# Patient Record
Sex: Male | Born: 1955 | Race: White | Hispanic: No | Marital: Single | Smoking: Current some day smoker
Health system: Southern US, Community
[De-identification: ages and names within clinical notes are randomized; demographics above are authoritative.]

## PROBLEM LIST (undated history)

## (undated) DIAGNOSIS — M549 Dorsalgia, unspecified: Secondary | ICD-10-CM

## (undated) DIAGNOSIS — R51 Headache: Secondary | ICD-10-CM

## (undated) DIAGNOSIS — N200 Calculus of kidney: Secondary | ICD-10-CM

## (undated) DIAGNOSIS — K219 Gastro-esophageal reflux disease without esophagitis: Secondary | ICD-10-CM

## (undated) DIAGNOSIS — R519 Headache, unspecified: Secondary | ICD-10-CM

## (undated) DIAGNOSIS — Z8673 Personal history of transient ischemic attack (TIA), and cerebral infarction without residual deficits: Secondary | ICD-10-CM

## (undated) DIAGNOSIS — R55 Syncope and collapse: Secondary | ICD-10-CM

## (undated) DIAGNOSIS — M1990 Unspecified osteoarthritis, unspecified site: Secondary | ICD-10-CM

## (undated) DIAGNOSIS — G459 Transient cerebral ischemic attack, unspecified: Secondary | ICD-10-CM

## (undated) DIAGNOSIS — E119 Type 2 diabetes mellitus without complications: Secondary | ICD-10-CM

## (undated) DIAGNOSIS — R569 Unspecified convulsions: Secondary | ICD-10-CM

## (undated) DIAGNOSIS — D860 Sarcoidosis of lung: Secondary | ICD-10-CM

## (undated) DIAGNOSIS — Z720 Tobacco use: Secondary | ICD-10-CM

## (undated) DIAGNOSIS — N400 Enlarged prostate without lower urinary tract symptoms: Secondary | ICD-10-CM

## (undated) DIAGNOSIS — I10 Essential (primary) hypertension: Secondary | ICD-10-CM

## (undated) HISTORY — PX: TOTAL HIP ARTHROPLASTY: SHX124

## (undated) HISTORY — PX: JOINT REPLACEMENT: SHX530

## (undated) HISTORY — DX: Unspecified osteoarthritis, unspecified site: M19.90

## (undated) HISTORY — PX: KNEE SURGERY: SHX244

## (undated) HISTORY — DX: Headache, unspecified: R51.9

## (undated) HISTORY — DX: Syncope and collapse: R55

## (undated) HISTORY — DX: Headache: R51

## (undated) HISTORY — DX: Transient cerebral ischemic attack, unspecified: G45.9

---

## 1999-07-11 ENCOUNTER — Emergency Department (HOSPITAL_COMMUNITY): Admission: EM | Admit: 1999-07-11 | Discharge: 1999-07-12 | Payer: Self-pay | Admitting: Emergency Medicine

## 1999-07-12 ENCOUNTER — Encounter: Payer: Self-pay | Admitting: Emergency Medicine

## 1999-11-02 ENCOUNTER — Emergency Department (HOSPITAL_COMMUNITY): Admission: EM | Admit: 1999-11-02 | Discharge: 1999-11-02 | Payer: Self-pay | Admitting: Emergency Medicine

## 1999-11-02 ENCOUNTER — Encounter: Payer: Self-pay | Admitting: Emergency Medicine

## 1999-11-30 ENCOUNTER — Emergency Department (HOSPITAL_COMMUNITY): Admission: EM | Admit: 1999-11-30 | Discharge: 1999-11-30 | Payer: Self-pay | Admitting: Emergency Medicine

## 1999-11-30 ENCOUNTER — Encounter: Payer: Self-pay | Admitting: Emergency Medicine

## 1999-12-05 ENCOUNTER — Emergency Department (HOSPITAL_COMMUNITY): Admission: EM | Admit: 1999-12-05 | Discharge: 1999-12-05 | Payer: Self-pay | Admitting: Emergency Medicine

## 1999-12-05 ENCOUNTER — Encounter: Payer: Self-pay | Admitting: Emergency Medicine

## 2001-06-26 HISTORY — PX: HERNIA REPAIR: SHX51

## 2001-11-08 ENCOUNTER — Encounter: Payer: Self-pay | Admitting: Emergency Medicine

## 2001-11-08 ENCOUNTER — Inpatient Hospital Stay (HOSPITAL_COMMUNITY): Admission: EM | Admit: 2001-11-08 | Discharge: 2001-11-10 | Payer: Self-pay | Admitting: Emergency Medicine

## 2001-11-09 ENCOUNTER — Encounter: Payer: Self-pay | Admitting: Emergency Medicine

## 2001-11-10 ENCOUNTER — Encounter: Payer: Self-pay | Admitting: Cardiology

## 2002-03-25 ENCOUNTER — Encounter: Admission: RE | Admit: 2002-03-25 | Discharge: 2002-03-25 | Payer: Self-pay | Admitting: Urology

## 2002-03-25 ENCOUNTER — Encounter: Payer: Self-pay | Admitting: Urology

## 2002-04-10 ENCOUNTER — Encounter: Admission: RE | Admit: 2002-04-10 | Discharge: 2002-07-09 | Payer: Self-pay | Admitting: Neurology

## 2003-01-20 ENCOUNTER — Encounter: Admission: RE | Admit: 2003-01-20 | Discharge: 2003-03-12 | Payer: Self-pay | Admitting: Neurology

## 2003-09-01 ENCOUNTER — Emergency Department (HOSPITAL_COMMUNITY): Admission: EM | Admit: 2003-09-01 | Discharge: 2003-09-01 | Payer: Self-pay | Admitting: Emergency Medicine

## 2004-05-11 ENCOUNTER — Inpatient Hospital Stay (HOSPITAL_COMMUNITY): Admission: RE | Admit: 2004-05-11 | Discharge: 2004-05-13 | Payer: Self-pay | Admitting: Orthopedic Surgery

## 2004-06-02 ENCOUNTER — Observation Stay (HOSPITAL_COMMUNITY): Admission: EM | Admit: 2004-06-02 | Discharge: 2004-06-03 | Payer: Self-pay | Admitting: Emergency Medicine

## 2004-11-09 ENCOUNTER — Encounter: Admission: RE | Admit: 2004-11-09 | Discharge: 2005-01-23 | Payer: Self-pay | Admitting: Orthopedic Surgery

## 2005-05-02 ENCOUNTER — Observation Stay (HOSPITAL_COMMUNITY): Admission: EM | Admit: 2005-05-02 | Discharge: 2005-05-02 | Payer: Self-pay | Admitting: *Deleted

## 2005-09-25 ENCOUNTER — Ambulatory Visit: Payer: Self-pay

## 2005-09-25 ENCOUNTER — Encounter: Payer: Self-pay | Admitting: Cardiology

## 2006-03-12 ENCOUNTER — Emergency Department (HOSPITAL_COMMUNITY): Admission: EM | Admit: 2006-03-12 | Discharge: 2006-03-12 | Payer: Self-pay | Admitting: Emergency Medicine

## 2006-06-28 ENCOUNTER — Emergency Department (HOSPITAL_COMMUNITY): Admission: EM | Admit: 2006-06-28 | Discharge: 2006-06-28 | Payer: Self-pay | Admitting: Emergency Medicine

## 2006-07-11 ENCOUNTER — Ambulatory Visit (HOSPITAL_BASED_OUTPATIENT_CLINIC_OR_DEPARTMENT_OTHER): Admission: RE | Admit: 2006-07-11 | Discharge: 2006-07-11 | Payer: Self-pay | Admitting: Urology

## 2006-11-27 ENCOUNTER — Emergency Department (HOSPITAL_COMMUNITY): Admission: EM | Admit: 2006-11-27 | Discharge: 2006-11-27 | Payer: Self-pay | Admitting: *Deleted

## 2006-12-06 ENCOUNTER — Ambulatory Visit (HOSPITAL_BASED_OUTPATIENT_CLINIC_OR_DEPARTMENT_OTHER): Admission: RE | Admit: 2006-12-06 | Discharge: 2006-12-06 | Payer: Self-pay | Admitting: Urology

## 2007-02-19 ENCOUNTER — Emergency Department (HOSPITAL_COMMUNITY): Admission: EM | Admit: 2007-02-19 | Discharge: 2007-02-20 | Payer: Self-pay | Admitting: Emergency Medicine

## 2007-04-22 ENCOUNTER — Encounter: Admission: RE | Admit: 2007-04-22 | Discharge: 2007-04-22 | Payer: Self-pay | Admitting: Neurology

## 2007-08-13 ENCOUNTER — Encounter: Payer: Self-pay | Admitting: Internal Medicine

## 2007-09-03 ENCOUNTER — Ambulatory Visit: Payer: Self-pay | Admitting: Internal Medicine

## 2007-09-03 ENCOUNTER — Ambulatory Visit: Payer: Self-pay | Admitting: Cardiology

## 2007-09-03 DIAGNOSIS — R599 Enlarged lymph nodes, unspecified: Secondary | ICD-10-CM | POA: Insufficient documentation

## 2007-09-03 DIAGNOSIS — F17200 Nicotine dependence, unspecified, uncomplicated: Secondary | ICD-10-CM | POA: Insufficient documentation

## 2007-09-03 DIAGNOSIS — E785 Hyperlipidemia, unspecified: Secondary | ICD-10-CM | POA: Insufficient documentation

## 2007-09-03 DIAGNOSIS — R930 Abnormal findings on diagnostic imaging of skull and head, not elsewhere classified: Secondary | ICD-10-CM | POA: Insufficient documentation

## 2007-09-03 LAB — CONVERTED CEMR LAB
Calcium: 9.4 mg/dL (ref 8.4–10.5)
GFR calc Af Amer: 101 mL/min
GFR calc non Af Amer: 83 mL/min
Glucose, Bld: 80 mg/dL (ref 70–99)
Sodium: 143 meq/L (ref 135–145)

## 2007-09-04 ENCOUNTER — Ambulatory Visit: Payer: Self-pay | Admitting: Internal Medicine

## 2007-09-04 LAB — CONVERTED CEMR LAB: Pro B Natriuretic peptide (BNP): 20 pg/mL (ref 0.0–100.0)

## 2007-09-07 ENCOUNTER — Telehealth: Payer: Self-pay | Admitting: Internal Medicine

## 2007-09-11 ENCOUNTER — Ambulatory Visit: Payer: Self-pay

## 2007-09-11 ENCOUNTER — Encounter: Payer: Self-pay | Admitting: Internal Medicine

## 2007-09-17 ENCOUNTER — Ambulatory Visit: Payer: Self-pay | Admitting: Internal Medicine

## 2007-09-23 ENCOUNTER — Ambulatory Visit: Payer: Self-pay | Admitting: Internal Medicine

## 2007-09-23 DIAGNOSIS — R942 Abnormal results of pulmonary function studies: Secondary | ICD-10-CM

## 2007-09-24 ENCOUNTER — Ambulatory Visit: Payer: Self-pay | Admitting: Cardiovascular Disease

## 2007-10-10 ENCOUNTER — Encounter: Payer: Self-pay | Admitting: Internal Medicine

## 2007-10-10 ENCOUNTER — Ambulatory Visit: Payer: Self-pay

## 2008-01-03 DIAGNOSIS — M76899 Other specified enthesopathies of unspecified lower limb, excluding foot: Secondary | ICD-10-CM

## 2008-01-21 ENCOUNTER — Ambulatory Visit: Payer: Self-pay | Admitting: Nurse Practitioner

## 2008-01-21 DIAGNOSIS — D869 Sarcoidosis, unspecified: Secondary | ICD-10-CM | POA: Insufficient documentation

## 2008-01-21 DIAGNOSIS — M51379 Other intervertebral disc degeneration, lumbosacral region without mention of lumbar back pain or lower extremity pain: Secondary | ICD-10-CM | POA: Insufficient documentation

## 2008-01-21 DIAGNOSIS — M5137 Other intervertebral disc degeneration, lumbosacral region: Secondary | ICD-10-CM

## 2008-01-21 DIAGNOSIS — I10 Essential (primary) hypertension: Secondary | ICD-10-CM

## 2008-01-21 LAB — CONVERTED CEMR LAB
Nitrite: NEGATIVE
Specific Gravity, Urine: 1.025
Urobilinogen, UA: 0.2

## 2008-01-28 ENCOUNTER — Encounter (INDEPENDENT_AMBULATORY_CARE_PROVIDER_SITE_OTHER): Payer: Self-pay | Admitting: Nurse Practitioner

## 2008-02-03 LAB — CONVERTED CEMR LAB
ALT: 19 units/L (ref 0–53)
AST: 16 units/L (ref 0–37)
Albumin: 4.3 g/dL (ref 3.5–5.2)
Alkaline Phosphatase: 77 units/L (ref 39–117)
BUN: 15 mg/dL (ref 6–23)
Basophils Absolute: 0 10*3/uL (ref 0.0–0.1)
Basophils Relative: 0 % (ref 0–1)
Eosinophils Absolute: 0.3 10*3/uL (ref 0.0–0.7)
HDL: 36 mg/dL — ABNORMAL LOW (ref >39)
LDL Cholesterol: 119 mg/dL — ABNORMAL HIGH (ref 0–99)
MCHC: 33 g/dL (ref 30.0–36.0)
MCV: 93.7 fL (ref 78.0–100.0)
Microalb, Ur: 1.75 mg/dL (ref 0.00–1.89)
Neutrophils Relative %: 58 % (ref 43–77)
PSA: 0.69 ng/mL (ref 0.10–4.00)
Platelets: 252 10*3/uL (ref 150–400)
Potassium: 4.6 meq/L (ref 3.5–5.3)
RDW: 15 % (ref 11.5–15.5)
Sodium: 141 meq/L (ref 135–145)
Total Protein: 6.9 g/dL (ref 6.0–8.3)

## 2008-02-04 ENCOUNTER — Ambulatory Visit: Payer: Self-pay | Admitting: Nurse Practitioner

## 2008-02-20 ENCOUNTER — Telehealth (INDEPENDENT_AMBULATORY_CARE_PROVIDER_SITE_OTHER): Payer: Self-pay | Admitting: Nurse Practitioner

## 2008-02-26 ENCOUNTER — Ambulatory Visit: Payer: Self-pay | Admitting: Nurse Practitioner

## 2008-02-26 DIAGNOSIS — Z87442 Personal history of urinary calculi: Secondary | ICD-10-CM

## 2008-02-26 LAB — CONVERTED CEMR LAB
Bilirubin Urine: NEGATIVE
Blood in Urine, dipstick: NEGATIVE
Ketones, urine, test strip: NEGATIVE
Nitrite: NEGATIVE
Specific Gravity, Urine: 1.02
Urobilinogen, UA: 0.2

## 2008-04-08 ENCOUNTER — Ambulatory Visit: Payer: Self-pay | Admitting: Nurse Practitioner

## 2008-04-09 LAB — CONVERTED CEMR LAB
ALT: 14 units/L (ref 0–53)
AST: 15 units/L (ref 0–37)
Albumin: 4 g/dL (ref 3.5–5.2)
Alkaline Phosphatase: 65 units/L (ref 39–117)
Cholesterol: 174 mg/dL (ref 0–200)
HDL: 30 mg/dL — ABNORMAL LOW (ref >39)
Indirect Bilirubin: 0.5 mg/dL (ref 0.0–0.9)
LDL Cholesterol: 113 mg/dL — ABNORMAL HIGH (ref 0–99)
Total Protein: 6.3 g/dL (ref 6.0–8.3)
Triglycerides: 153 mg/dL — ABNORMAL HIGH (ref <150)

## 2008-04-16 ENCOUNTER — Encounter (INDEPENDENT_AMBULATORY_CARE_PROVIDER_SITE_OTHER): Payer: Self-pay | Admitting: *Deleted

## 2008-05-25 ENCOUNTER — Emergency Department (HOSPITAL_BASED_OUTPATIENT_CLINIC_OR_DEPARTMENT_OTHER): Admission: EM | Admit: 2008-05-25 | Discharge: 2008-05-25 | Payer: Self-pay | Admitting: Emergency Medicine

## 2008-06-04 ENCOUNTER — Encounter: Payer: Self-pay | Admitting: Emergency Medicine

## 2008-06-04 ENCOUNTER — Emergency Department (HOSPITAL_COMMUNITY): Admission: EM | Admit: 2008-06-04 | Discharge: 2008-06-04 | Payer: Self-pay | Admitting: Emergency Medicine

## 2008-06-04 ENCOUNTER — Ambulatory Visit: Payer: Self-pay | Admitting: Interventional Radiology

## 2008-06-04 ENCOUNTER — Encounter (INDEPENDENT_AMBULATORY_CARE_PROVIDER_SITE_OTHER): Payer: Self-pay | Admitting: Nurse Practitioner

## 2008-06-04 LAB — CONVERTED CEMR LAB
Basophils Absolute: 0.1 10*3/uL
Basophils Relative: 1 %
CO2: 28 meq/L
Chloride: 107 meq/L
Creatinine, Ser: 0.9 mg/dL
Lymphocytes Relative: 32 %
MCHC: 33.3 g/dL
Monocytes Absolute: 0.8 10*3/uL
Neutro Abs: 9.7 10*3/uL
Neutrophils Relative %: 60 %
Platelets: 292 10*3/uL
Potassium: 3.9 meq/L
RDW: 14.1 %
Sodium: 144 meq/L

## 2008-06-08 ENCOUNTER — Encounter (INDEPENDENT_AMBULATORY_CARE_PROVIDER_SITE_OTHER): Payer: Self-pay | Admitting: Nurse Practitioner

## 2008-06-08 ENCOUNTER — Telehealth (INDEPENDENT_AMBULATORY_CARE_PROVIDER_SITE_OTHER): Payer: Self-pay | Admitting: Nurse Practitioner

## 2008-06-08 DIAGNOSIS — N200 Calculus of kidney: Secondary | ICD-10-CM | POA: Insufficient documentation

## 2008-06-08 DIAGNOSIS — N1330 Unspecified hydronephrosis: Secondary | ICD-10-CM

## 2008-06-09 ENCOUNTER — Encounter (INDEPENDENT_AMBULATORY_CARE_PROVIDER_SITE_OTHER): Payer: Self-pay | Admitting: Nurse Practitioner

## 2008-06-10 ENCOUNTER — Emergency Department (HOSPITAL_COMMUNITY): Admission: EM | Admit: 2008-06-10 | Discharge: 2008-06-10 | Payer: Self-pay | Admitting: Emergency Medicine

## 2008-09-09 ENCOUNTER — Encounter: Admission: RE | Admit: 2008-09-09 | Discharge: 2008-09-09 | Payer: Self-pay | Admitting: Neurology

## 2008-10-09 ENCOUNTER — Ambulatory Visit: Payer: Self-pay | Admitting: Nurse Practitioner

## 2008-10-09 DIAGNOSIS — J309 Allergic rhinitis, unspecified: Secondary | ICD-10-CM

## 2008-10-09 DIAGNOSIS — M62838 Other muscle spasm: Secondary | ICD-10-CM | POA: Insufficient documentation

## 2008-10-09 DIAGNOSIS — M25519 Pain in unspecified shoulder: Secondary | ICD-10-CM | POA: Insufficient documentation

## 2008-10-19 ENCOUNTER — Ambulatory Visit: Payer: Self-pay | Admitting: Diagnostic Radiology

## 2008-10-19 ENCOUNTER — Emergency Department (HOSPITAL_BASED_OUTPATIENT_CLINIC_OR_DEPARTMENT_OTHER): Admission: EM | Admit: 2008-10-19 | Discharge: 2008-10-19 | Payer: Self-pay | Admitting: Emergency Medicine

## 2008-10-23 ENCOUNTER — Ambulatory Visit: Payer: Self-pay | Admitting: Nurse Practitioner

## 2008-10-23 LAB — CONVERTED CEMR LAB
Albumin: 4 g/dL (ref 3.5–5.2)
Alkaline Phosphatase: 75 units/L (ref 39–117)
HDL: 26 mg/dL — ABNORMAL LOW (ref >39)
LDL Cholesterol: 91 mg/dL (ref 0–99)
Total CHOL/HDL Ratio: 6
Total Protein: 6.1 g/dL (ref 6.0–8.3)
Triglycerides: 189 mg/dL — ABNORMAL HIGH (ref <150)

## 2008-10-26 ENCOUNTER — Encounter (INDEPENDENT_AMBULATORY_CARE_PROVIDER_SITE_OTHER): Payer: Self-pay | Admitting: Nurse Practitioner

## 2008-10-30 ENCOUNTER — Telehealth (INDEPENDENT_AMBULATORY_CARE_PROVIDER_SITE_OTHER): Payer: Self-pay | Admitting: Nurse Practitioner

## 2008-10-30 ENCOUNTER — Encounter (INDEPENDENT_AMBULATORY_CARE_PROVIDER_SITE_OTHER): Payer: Self-pay | Admitting: Nurse Practitioner

## 2008-11-02 ENCOUNTER — Emergency Department (HOSPITAL_COMMUNITY): Admission: EM | Admit: 2008-11-02 | Discharge: 2008-11-02 | Payer: Self-pay | Admitting: Emergency Medicine

## 2008-11-06 ENCOUNTER — Ambulatory Visit: Payer: Self-pay | Admitting: Nurse Practitioner

## 2008-11-06 LAB — CONVERTED CEMR LAB: Potassium: 3.9 meq/L (ref 3.5–5.3)

## 2008-11-09 ENCOUNTER — Encounter (INDEPENDENT_AMBULATORY_CARE_PROVIDER_SITE_OTHER): Payer: Self-pay | Admitting: Nurse Practitioner

## 2008-11-11 ENCOUNTER — Telehealth (INDEPENDENT_AMBULATORY_CARE_PROVIDER_SITE_OTHER): Payer: Self-pay | Admitting: Nurse Practitioner

## 2008-11-19 ENCOUNTER — Encounter (INDEPENDENT_AMBULATORY_CARE_PROVIDER_SITE_OTHER): Payer: Self-pay | Admitting: *Deleted

## 2008-11-24 ENCOUNTER — Encounter (INDEPENDENT_AMBULATORY_CARE_PROVIDER_SITE_OTHER): Payer: Self-pay | Admitting: Nurse Practitioner

## 2008-11-24 DIAGNOSIS — G459 Transient cerebral ischemic attack, unspecified: Secondary | ICD-10-CM | POA: Insufficient documentation

## 2008-11-24 DIAGNOSIS — M549 Dorsalgia, unspecified: Secondary | ICD-10-CM | POA: Insufficient documentation

## 2008-11-27 ENCOUNTER — Ambulatory Visit: Payer: Self-pay | Admitting: Nurse Practitioner

## 2008-11-27 DIAGNOSIS — E669 Obesity, unspecified: Secondary | ICD-10-CM

## 2009-01-07 ENCOUNTER — Emergency Department (HOSPITAL_BASED_OUTPATIENT_CLINIC_OR_DEPARTMENT_OTHER): Admission: EM | Admit: 2009-01-07 | Discharge: 2009-01-07 | Payer: Self-pay | Admitting: Emergency Medicine

## 2009-01-07 ENCOUNTER — Ambulatory Visit: Payer: Self-pay | Admitting: Diagnostic Radiology

## 2009-01-25 ENCOUNTER — Telehealth (INDEPENDENT_AMBULATORY_CARE_PROVIDER_SITE_OTHER): Payer: Self-pay | Admitting: Nurse Practitioner

## 2009-02-08 ENCOUNTER — Encounter (INDEPENDENT_AMBULATORY_CARE_PROVIDER_SITE_OTHER): Payer: Self-pay | Admitting: Family Medicine

## 2009-03-20 ENCOUNTER — Emergency Department (HOSPITAL_BASED_OUTPATIENT_CLINIC_OR_DEPARTMENT_OTHER): Admission: EM | Admit: 2009-03-20 | Discharge: 2009-03-20 | Payer: Self-pay | Admitting: Emergency Medicine

## 2009-03-20 ENCOUNTER — Ambulatory Visit: Payer: Self-pay | Admitting: Diagnostic Radiology

## 2009-04-20 ENCOUNTER — Ambulatory Visit (HOSPITAL_COMMUNITY): Admission: EM | Admit: 2009-04-20 | Discharge: 2009-04-21 | Payer: Self-pay | Admitting: Emergency Medicine

## 2009-04-22 ENCOUNTER — Emergency Department (HOSPITAL_COMMUNITY): Admission: EM | Admit: 2009-04-22 | Discharge: 2009-04-23 | Payer: Self-pay | Admitting: Emergency Medicine

## 2009-04-22 ENCOUNTER — Telehealth (INDEPENDENT_AMBULATORY_CARE_PROVIDER_SITE_OTHER): Payer: Self-pay | Admitting: *Deleted

## 2009-04-23 ENCOUNTER — Encounter (INDEPENDENT_AMBULATORY_CARE_PROVIDER_SITE_OTHER): Payer: Self-pay | Admitting: Nurse Practitioner

## 2009-04-27 ENCOUNTER — Ambulatory Visit (HOSPITAL_BASED_OUTPATIENT_CLINIC_OR_DEPARTMENT_OTHER): Admission: RE | Admit: 2009-04-27 | Discharge: 2009-04-27 | Payer: Self-pay | Admitting: Urology

## 2009-04-29 ENCOUNTER — Telehealth (INDEPENDENT_AMBULATORY_CARE_PROVIDER_SITE_OTHER): Payer: Self-pay | Admitting: *Deleted

## 2009-05-07 ENCOUNTER — Emergency Department (HOSPITAL_BASED_OUTPATIENT_CLINIC_OR_DEPARTMENT_OTHER): Admission: EM | Admit: 2009-05-07 | Discharge: 2009-05-07 | Payer: Self-pay | Admitting: Emergency Medicine

## 2009-05-17 ENCOUNTER — Encounter (INDEPENDENT_AMBULATORY_CARE_PROVIDER_SITE_OTHER): Payer: Self-pay | Admitting: Nurse Practitioner

## 2009-07-14 ENCOUNTER — Ambulatory Visit: Payer: Self-pay | Admitting: Nurse Practitioner

## 2009-09-23 ENCOUNTER — Ambulatory Visit: Payer: Self-pay | Admitting: Nurse Practitioner

## 2009-09-23 DIAGNOSIS — R490 Dysphonia: Secondary | ICD-10-CM

## 2009-09-23 LAB — CONVERTED CEMR LAB
AST: 14 units/L (ref 0–37)
Albumin: 4.2 g/dL (ref 3.5–5.2)
BUN: 12 mg/dL (ref 6–23)
Bilirubin Urine: NEGATIVE
CO2: 28 meq/L (ref 19–32)
Calcium: 9.5 mg/dL (ref 8.4–10.5)
Chloride: 103 meq/L (ref 96–112)
Eosinophils Relative: 2 % (ref 0–5)
Glucose, Bld: 87 mg/dL (ref 70–99)
HCT: 50.7 % (ref 39.0–52.0)
HDL goal, serum: 40 mg/dL
Hemoglobin: 16.8 g/dL (ref 13.0–17.0)
Lymphocytes Relative: 30 % (ref 12–46)
Lymphs Abs: 2.9 10*3/uL (ref 0.7–4.0)
Microalb, Ur: 1.52 mg/dL (ref 0.00–1.89)
Monocytes Absolute: 0.7 10*3/uL (ref 0.1–1.0)
Monocytes Relative: 7 % (ref 3–12)
Nitrite: NEGATIVE
PSA: 0.4 ng/mL (ref 0.10–4.00)
Potassium: 5 meq/L (ref 3.5–5.3)
Rapid HIV Screen: NEGATIVE
Specific Gravity, Urine: >=1.03
TSH: 2.057 microintl units/mL (ref 0.350–4.500)
Urobilinogen, UA: 0.2
WBC: 9.7 10*3/uL (ref 4.0–10.5)

## 2009-09-24 ENCOUNTER — Encounter (INDEPENDENT_AMBULATORY_CARE_PROVIDER_SITE_OTHER): Payer: Self-pay | Admitting: Nurse Practitioner

## 2009-10-07 ENCOUNTER — Ambulatory Visit: Payer: Self-pay | Admitting: Nurse Practitioner

## 2009-10-07 ENCOUNTER — Telehealth (INDEPENDENT_AMBULATORY_CARE_PROVIDER_SITE_OTHER): Payer: Self-pay | Admitting: Nurse Practitioner

## 2009-10-29 ENCOUNTER — Telehealth (INDEPENDENT_AMBULATORY_CARE_PROVIDER_SITE_OTHER): Payer: Self-pay | Admitting: Nurse Practitioner

## 2009-12-13 ENCOUNTER — Ambulatory Visit: Payer: Self-pay | Admitting: Nurse Practitioner

## 2009-12-13 DIAGNOSIS — L919 Hypertrophic disorder of the skin, unspecified: Secondary | ICD-10-CM

## 2009-12-13 DIAGNOSIS — L909 Atrophic disorder of skin, unspecified: Secondary | ICD-10-CM | POA: Insufficient documentation

## 2009-12-15 ENCOUNTER — Telehealth (INDEPENDENT_AMBULATORY_CARE_PROVIDER_SITE_OTHER): Payer: Self-pay | Admitting: Nurse Practitioner

## 2009-12-21 ENCOUNTER — Telehealth (INDEPENDENT_AMBULATORY_CARE_PROVIDER_SITE_OTHER): Payer: Self-pay | Admitting: Nurse Practitioner

## 2009-12-27 ENCOUNTER — Ambulatory Visit: Payer: Self-pay | Admitting: Diagnostic Radiology

## 2009-12-27 ENCOUNTER — Emergency Department (HOSPITAL_BASED_OUTPATIENT_CLINIC_OR_DEPARTMENT_OTHER): Admission: EM | Admit: 2009-12-27 | Discharge: 2009-12-27 | Payer: Self-pay | Admitting: Emergency Medicine

## 2009-12-28 ENCOUNTER — Telehealth (INDEPENDENT_AMBULATORY_CARE_PROVIDER_SITE_OTHER): Payer: Self-pay | Admitting: Nurse Practitioner

## 2010-01-07 ENCOUNTER — Telehealth (INDEPENDENT_AMBULATORY_CARE_PROVIDER_SITE_OTHER): Payer: Self-pay | Admitting: Nurse Practitioner

## 2010-01-11 ENCOUNTER — Emergency Department (HOSPITAL_COMMUNITY): Admission: EM | Admit: 2010-01-11 | Discharge: 2010-01-11 | Payer: Self-pay | Admitting: Emergency Medicine

## 2010-01-21 ENCOUNTER — Emergency Department (HOSPITAL_BASED_OUTPATIENT_CLINIC_OR_DEPARTMENT_OTHER): Admission: EM | Admit: 2010-01-21 | Discharge: 2010-01-21 | Payer: Self-pay | Admitting: Emergency Medicine

## 2010-01-28 ENCOUNTER — Encounter (INDEPENDENT_AMBULATORY_CARE_PROVIDER_SITE_OTHER): Payer: Self-pay | Admitting: Nurse Practitioner

## 2010-01-29 ENCOUNTER — Emergency Department (HOSPITAL_BASED_OUTPATIENT_CLINIC_OR_DEPARTMENT_OTHER): Admission: EM | Admit: 2010-01-29 | Discharge: 2010-01-29 | Payer: Self-pay | Admitting: Emergency Medicine

## 2010-02-02 ENCOUNTER — Encounter (INDEPENDENT_AMBULATORY_CARE_PROVIDER_SITE_OTHER): Payer: Self-pay | Admitting: Nurse Practitioner

## 2010-02-04 ENCOUNTER — Telehealth (INDEPENDENT_AMBULATORY_CARE_PROVIDER_SITE_OTHER): Payer: Self-pay | Admitting: Nurse Practitioner

## 2010-02-14 ENCOUNTER — Emergency Department (HOSPITAL_BASED_OUTPATIENT_CLINIC_OR_DEPARTMENT_OTHER): Admission: EM | Admit: 2010-02-14 | Discharge: 2010-02-14 | Payer: Self-pay | Admitting: Emergency Medicine

## 2010-02-22 ENCOUNTER — Encounter (INDEPENDENT_AMBULATORY_CARE_PROVIDER_SITE_OTHER): Payer: Self-pay | Admitting: Nurse Practitioner

## 2010-02-28 ENCOUNTER — Ambulatory Visit: Payer: Self-pay | Admitting: Diagnostic Radiology

## 2010-02-28 ENCOUNTER — Emergency Department (HOSPITAL_BASED_OUTPATIENT_CLINIC_OR_DEPARTMENT_OTHER): Admission: EM | Admit: 2010-02-28 | Discharge: 2010-02-28 | Payer: Self-pay | Admitting: Emergency Medicine

## 2010-03-03 ENCOUNTER — Encounter
Admission: RE | Admit: 2010-03-03 | Discharge: 2010-06-01 | Payer: Self-pay | Source: Home / Self Care | Admitting: Physical Medicine & Rehabilitation

## 2010-03-07 ENCOUNTER — Telehealth (INDEPENDENT_AMBULATORY_CARE_PROVIDER_SITE_OTHER): Payer: Self-pay | Admitting: Nurse Practitioner

## 2010-03-08 ENCOUNTER — Ambulatory Visit: Payer: Self-pay | Admitting: Physical Medicine & Rehabilitation

## 2010-03-16 ENCOUNTER — Emergency Department (HOSPITAL_COMMUNITY): Admission: EM | Admit: 2010-03-16 | Discharge: 2010-03-16 | Payer: Self-pay | Admitting: Emergency Medicine

## 2010-03-21 ENCOUNTER — Telehealth (INDEPENDENT_AMBULATORY_CARE_PROVIDER_SITE_OTHER): Payer: Self-pay | Admitting: Nurse Practitioner

## 2010-04-04 ENCOUNTER — Encounter: Admission: RE | Admit: 2010-04-04 | Discharge: 2010-04-04 | Payer: Self-pay | Admitting: Orthopedic Surgery

## 2010-04-05 ENCOUNTER — Ambulatory Visit: Payer: Self-pay | Admitting: Physical Medicine & Rehabilitation

## 2010-05-04 ENCOUNTER — Ambulatory Visit: Payer: Self-pay | Admitting: Physical Medicine & Rehabilitation

## 2010-05-24 ENCOUNTER — Encounter
Admission: RE | Admit: 2010-05-24 | Discharge: 2010-07-04 | Payer: Self-pay | Source: Home / Self Care | Attending: Physical Medicine & Rehabilitation | Admitting: Physical Medicine & Rehabilitation

## 2010-06-03 ENCOUNTER — Telehealth (INDEPENDENT_AMBULATORY_CARE_PROVIDER_SITE_OTHER): Payer: Self-pay | Admitting: Nurse Practitioner

## 2010-06-03 ENCOUNTER — Ambulatory Visit: Payer: Self-pay | Admitting: Physical Medicine & Rehabilitation

## 2010-06-08 ENCOUNTER — Ambulatory Visit: Payer: Self-pay | Admitting: Physical Medicine & Rehabilitation

## 2010-06-15 ENCOUNTER — Ambulatory Visit: Payer: Self-pay | Admitting: Nurse Practitioner

## 2010-06-28 ENCOUNTER — Encounter
Admission: RE | Admit: 2010-06-28 | Discharge: 2010-07-04 | Payer: Self-pay | Source: Home / Self Care | Attending: Physical Medicine & Rehabilitation | Admitting: Physical Medicine & Rehabilitation

## 2010-07-05 ENCOUNTER — Telehealth (INDEPENDENT_AMBULATORY_CARE_PROVIDER_SITE_OTHER): Payer: Self-pay | Admitting: Nurse Practitioner

## 2010-07-19 NOTE — Procedures (Addendum)
NAMESALOME, COZBY NO.:  [id number]  MEDICAL RECORD NO.:  [id number]           PATIENT TYPE:  LOCATION:                                 FACILITY:  PHYSICIAN:  Erick Colace, M.D.DATE OF BIRTH:  February 03, 1956  DATE OF PROCEDURE: DATE OF DISCHARGE:                              OPERATIVE REPORT  PROCEDURE:  Left greater trochanteric bursa injection.  INDICATION:  Left trochanteric bursitis.  Pain is only partially response to medication management including narcotic analgesic medications and interferes with sleep.  Informed consent was obtained after describing risks and benefits of the procedure with the patient.  He elects to proceed and has given written consent.  The patient was placed in a right lateral decubitus position area over the greater trochanter marked, prepped with Betadine, alcohol; entered with 25-gauge, a inch and a half needle.  Could not achieve bone contact, therefore we utilized a 25-gauge 3.5-inch needle which was inserted.  After negative drawback of blood, 1 mL of 40 mg/mL Depo- Medrol and 4 mL of 1% lidocaine were injected.  The patient tolerated the procedure well.  Postprocedure instructions given.     Erick Colace, M.D. Electronically Signed    AEK/MEDQ  D:  07/04/2010 16:35:07  T:  07/05/2010 08:32:10  Job:  [id number]

## 2010-07-26 NOTE — Progress Notes (Signed)
Summary: medications? refills  Phone Note Call from Patient Call back at Home Phone [phone] Call back at  [phone]   Summary of Call: Pt wants to speak with the provider in reference a prescription that her orthopedic won't be able to refills anymore; the name of the medication is oxycodone 10/325 and fenpenyl 50 mcg  per hour. .  ([facility] Patrina Levering FNP Initial call taken by: Manon Hilding,  December 21, 2009 4:14 PM  Follow-up for Phone Call        forward to N. Daphine Deutscher, FNP Follow-up by: Levon Hedger,  December 23, 2009 11:37 AM  Additional Follow-up for Phone Call Additional follow up Details #1::        mrr riordan says its not that they wont be able to fill that rx untilhe pays a certaiin amount on the balance that he owes that there office. He says all he wants you to do is just write it for this coming month,(july) and then once he pays off the balance or at least half of $1,500, they will take back over. Additional Follow-up by: Leodis Rains,  December 23, 2009 12:54 PM    Additional Follow-up for Phone Call Additional follow up Details #2::    Sorry, pt is on high dose narcotic medications and unfortunately he will not be able to get Rx from this office. This is as a general rule to ALL pts going to pain clinics/Specialist -it is not specific to him. Although I did see the note regarding his financial situation unfortunately that's a request we won't be able to accomodate Follow-up by: Lehman Prom FNP,  December 23, 2009 1:13 PM  Additional Follow-up for Phone Call Additional follow up Details #3:: Details for Additional Follow-up Action Taken: Levon Hedger  December 23, 2009 3:12 PM Left message on machine for pt to return call to the office.  Patient returned call.Cala Bradford Tinnin  December 23, 2009 4:04 PM  Spoke with pt. and advised of provider's response -- asked if provider could refer to another doctor so that he could be seen and have his meds taken care of  until his settlement comes in? Dutch Quint RN  December 24, 2009 9:32 AM Pt would have to be referred to a pain clinic.  will order referral n.martin,fnp  December 28, 2009 1:59 PM

## 2010-07-26 NOTE — Assessment & Plan Note (Signed)
Summary: Skin Tags   Vital Signs:  Patient profile:   55 year old male Weight:      303.4 pounds BSA:     2.52 Temp:     97.7 degrees F oral Pulse rate:   71 / minute Pulse rhythm:   regular Resp:     20 per minute BP sitting:   130 / 79  (right arm) Cuff size:   large  Vitals Entered By: Gaylyn Cheers RN (December 13, 2009 12:20 PM) CC: pt states has multiple moles axilla and groin area would like to have removed Is Patient Diabetic? No Pain Assessment Patient in pain? yes     Location: LBP, rt hip Intensity: 7 Type: aching Onset of pain  Chronic  Does patient need assistance? Functional Status Self care Ambulation Normal Comments did not bring meds, no longer take Proventil or Promethazine   Primary Care Provider:  None  CC:  pt states has multiple moles axilla and groin area would like to have removed.  History of Present Illness:  Pt into the office with multiple skin tags that he would like removed. Largest on left inner thigh - this particular one gets irritated at times upon contact with pants others under bilateral arms are just irritating  Allergies (verified): 1)  ! Morphine  Review of Systems Derm:  Complains of lesion(s).  Physical Exam  General:  alert.   Head:  normocephalic.   Skin:  bil axilla - flesh tone, skin tags (multiple) left inner thigh - 3 pendulous, skin tags Psych:  Oriented X3.     Impression & Recommendations:  Problem # 1:  SKIN TAG (ICD-701.9) will refer to dermatology for removal as pt has approx. 20 in count Orders: Dermatology Referral (Derma)  Complete Medication List: 1)  Aggrenox 25-200 Mg Cp12 (Aspirin-dipyridamole) .... Two times a day 2)  Duragesic-50 50 Mcg/hr Pt72 (Fentanyl) .Marland Kitchen.. 1 patch every 3 day (72 hrs) **rx by dr. Ethelene Hal** 3)  Percocet 10-650 Mg Tabs (Oxycodone-acetaminophen) .... As needed  **rx by dr. Ethelene Hal** 4)  Verapamil Hcl 80 Mg Tabs (Verapamil hcl) .... Take one tablet 3 times daily *dr Rana Snare 5)   Simvastatin 40 Mg Tabs (Simvastatin) .Marland Kitchen.. 1 tablet by mouth nightly for cholesterol 6)  Loratadine 10 Mg Tabs (Loratadine) .... One tablet by mouth daily for allergies 7)  Proventil Hfa 108 (90 Base) Mcg/act Aers (Albuterol sulfate) .... 2 puffs every 6 hours as needed for shortness of breath 8)  Zestoretic 20-25 Mg Tabs (Lisinopril-hydrochlorothiazide) .... One tablet by mouth daily for blood pressure 9)  Flonase 50 Mcg/act Susp (Fluticasone propionate) .Marland Kitchen.. 1 spray in each nostril two times a day 10)  Nexium 40 Mg Cpdr (Esomeprazole magnesium) .... One caspule by mouth daily before breakfast 11)  Magic Mouthwash (diphenhydramine/mylanta/sucralfate)  .Marland Kitchen.. 1 teaspoon  swish and swallow three times per day before meals  Patient Instructions: 1)  You will be referred to the the Dermatologist to remove all the skin tags. 2)  Follow up as needed.

## 2010-07-26 NOTE — Letter (Signed)
Summary: [facility]  [facility]   Imported By: Arta Bruce 03/17/2010 11:05:41  _____________________________________________________________________  External Attachment:    Type:   Image     Comment:   External Document

## 2010-07-26 NOTE — Progress Notes (Signed)
Summary: Severe cold symptoms  Phone Note Call from Patient   Summary of Call: PLEASE CALL PATIENT NEED TO SPEAK WITH THE NURSE HE WANTS TO KNOW IF YOU CAN CALL TO SEE IF YOU CAN GET HIM SOME MEDICINE HAVING BAD COUGH,SORE THROAT. PHONE [phone] Initial call taken by: Domenic Polite,  June 03, 2010 11:22 AM  Follow-up for Phone Call        Has been running a fever, sometimes 102, since Monday night, Tuesday.  Today temp was 100.4.  Thought he was getting better yesterday, but feeling worse today.  Has been taking his loratadine, using Mucinex once in awhile.  Having headaches here and there, SOB mostly at night. Had nausea/vomiting earlier in week, none now.  Sitting with head elevated.    Cough is occasional productive of yellowish mucus.  States throat is raw.  Denies postnasal drip, having nasal congestion, draining clear fluid.  Denies earache or drainage.  Advised as per cold protocol for home management:   to alternate Tylenol with alleve for body ache and fever, coricidan HBP for cough, to humidify home, warm showers to loosen congestion.  Advised to continue loratadine, proventil inhaler as needed, flonase for nasal congestion.  Advised if respiratory symptoms worsen, to consider treatment at urgent care.  Verbalized agreement. Follow-up by: Dutch Quint RN,  June 03, 2010 5:48 PM

## 2010-07-26 NOTE — Progress Notes (Signed)
Summary: SEVERE BACK PAIN WANT RX   Phone Note Call from Patient   Reason for Call: Acute Illness Summary of Call: Scott Holden WANTS FNP MARTIN TO PRESCRIBE [place] FOR BACK PAIN . [facility] [address]   [phone] Scott Holden # [phone] [facility] [place]  Initial call taken by: Cheryll Dessert,  February 04, 2010 4:46 PM  Follow-up for Phone Call        forward to N. Daphine Deutscher, fnp Follow-up by: Levon Hedger,  February 04, 2010 5:19 PM  Additional Follow-up for Phone Call Additional follow up Details #1::        will not prescribe Scott Holden any narcotics records just received from previous office and those were sent to the pain clinic by Arna Medici in attempt to get Scott Holden an appt. (check with Arna Medici on status) Will order tramadol 50mg  - 2 tablets by mouth two times a day as needed (rx sent to pharmacy) Additional Follow-up by: Lehman Prom FNP,  February 04, 2010 5:35 PM    Additional Follow-up for Phone Call Additional follow up Details #2::    Scott Holden informed of above information. Follow-up by: Levon Hedger,  February 07, 2010 3:37 PM  New/Updated Medications: TRAMADOL HCL 50 MG TABS (TRAMADOL HCL) 2 tablet by mouth two times a day pnr for pain Prescriptions: TRAMADOL HCL 50 MG TABS (TRAMADOL HCL) 2 tablet by mouth two times a day pnr for pain  #90 x 0   Entered and Authorized by:   Lehman Prom FNP   Signed by:   Lehman Prom FNP on 02/04/2010   Method used:   [facility]ronically [facility]        [facility] [phone][facility] (retail)       [address]       [place], [place]  [address]       Ph: [phone] or [phone]       Fax: [phone]   RxID:   [id number]

## 2010-07-26 NOTE — Letter (Signed)
Summary: [facility]/MED REVIEW  [facility]/MED REVIEW   Imported By: Arta Bruce 08/19/2009 14:52:48  _____________________________________________________________________  External Attachment:    Type:   Image     Comment:   External Document

## 2010-07-26 NOTE — Progress Notes (Signed)
Summary: GI referral  Phone Note Outgoing Call   Summary of Call: GI referral ordered Reason - mid epigastric tenderness, hoarseness evaluate for possible EGD started on PPI Initial call taken by: Lehman Prom FNP,  October 07, 2009 12:58 PM

## 2010-07-26 NOTE — Letter (Signed)
Summary: *HSN Results Follow up  [facility]  [address] [place], [place] [address]   Phone: [phone]  Fax: [phone]      09/24/2009   Scott Holden [address] [place], [place]  [address]   Dear  Scott Holden,                            ____S.Drinkard,FNP   ____D. Gore,FNP       ____B. McPherson,MD   ____V. Rankins,MD    ____E. Mulberry,MD    _X___N. Daphine Deutscher, FNP  ____D. Reche Dixon, MD    ____K. Philipp Deputy, MD    ____Other     This letter is to inform you that your recent test(s):  _______Pap Smear    ___X____Lab Test     _______X-ray   ___X____ is within acceptable limits  _______ requires a medication change  _______ requires a follow-up lab visit  _______ requires a follow-up visit with your provider   Comments: Labs done during recent office visit are normal.       _________________________________________________________ If you have any questions, please contact our office [phone].                    Sincerely,    Scott Prom FNP [facility]

## 2010-07-26 NOTE — Progress Notes (Signed)
Summary: REFILL ON ON INHALER  Phone Note Call from Patient   Reason for Call: Acute Illness Summary of Call: Scott Holden. MR Rocchi CALLED AND SAYS THAT HE NEEDS A REFILL ON HIS PROVENTIL INHALER.  [facility] [address] Initial call taken by: Leodis Rains,  March 07, 2010 8:49 AM  Follow-up for Phone Call        Lately, been SOB, comes and goes with the changing seasons.   Getting "a little wheezy".  Doesn't think he needs to be seen for this.  OK to refill? Follow-up by: Dutch Quint RN,  March 08, 2010 4:39 PM  Additional Follow-up for Phone Call Additional follow up Details #1::        yes, rx sent electronically  notify Holden to check with pharmacy Additional Follow-up by: Lehman Prom FNP,  March 08, 2010 6:30 PM    Additional Follow-up for Phone Call Additional follow up Details #2::    Holden. advised of refill.  Dutch Quint RN  March 09, 2010 10:00 AM   Prescriptions: PROVENTIL HFA 108 (90 BASE) MCG/ACT AERS (ALBUTEROL SULFATE) 2 puffs every 6 hours as needed for shortness of breath  #7 Gram x 2   Entered and Authorized by:   Lehman Prom FNP   Signed by:   Lehman Prom FNP on 03/08/2010   Method used:   Electronically to        [facility] [phone]* (retail)       [address]       [place], [place]  [address]       Ph: [phone] or [phone]       Fax: [phone]   RxID:   [id number]

## 2010-07-26 NOTE — Progress Notes (Signed)
Summary: NEEDS ORTYHO REFERRAL  Phone Note Call from Patient Call back at [phone]   Reason for Call: Referral Summary of Call: [facility]. MR Carcamo SAYS HE WENT TO [facility] LAST WED OR THURSDAY FOR KNEE PAIN AND WAS TOLD THAT  HE NEEDS TO BE REFERRED TO AN ORTHOPEDIC. Initial call taken by: Leodis Rains,  March 21, 2010 12:32 PM  Follow-up for Phone Call        pt seen in ED r knee started "going out" about two weeks ago on Monday unable to put any weight, swelling, pain xrays done bune spurs and ligament tear. unable to stand longer than 10 mins before pain returns. needs ortho referral. seen by ortho in past and does owe $ to [employer]. does have medicaid willing to go to [facility]  Follow-up by: Michelle Nasuti,  March 23, 2010 12:04 PM  Additional Follow-up for Phone Call Additional follow up Details #1::        Reviewed records from ED. Xray not acute. Suggest he be seen here before referral made.  He is actually N. Martin's patient. Will defer to her. Tereso Newcomer PA-C  March 23, 2010 1:45 PM     Additional Follow-up for Phone Call Additional follow up Details #2::    x-rays did not show anything acute unable to see ligaments and tendons on x-rays so perhaps he misunderstood that he needs ortho referral to evaluate because only bony landmarks can be seen on x-ray since pt owes [facility] money advise him to call [facility] and any other local ortho offices to which we refer and see if they are willing to see him (give him the names and numbers of these office).  That way he can find out which is willing to accept him and then I can send the referral there.  We spend a great deal of time trying to refer pts to offices trying to avoid ones in which they have a balance so if pt can advocate and find out who is willing to see him, I will send the referral Follow-up by: Lehman Prom FNP,  March 23, 2010 2:01 PM  Additional Follow-up for  Phone Call Additional follow up Details #3:: Details for Additional Follow-up Action Taken: pt informed of the above information and will contact office to let us know where to send the referral. Additional Follow-up by: Levon Hedger,  March 28, 2010 9:30 AM

## 2010-07-26 NOTE — Progress Notes (Signed)
Summary: GI referral  Phone Note Outgoing Call   Summary of Call: pt came today to check on GI referral looks like you faxed it to [facility] on April 18th can you follow up with them to see when he can be seen?  Symptoms getting worse Initial call taken by: Lehman Prom FNP,  Oct 29, 2009 4:16 PM  Follow-up for Phone Call        Pt owes [employer] money. Pt will be going [facility] [place] on 11-03-09 @ 11am.  Pt is aware of appt. What do you want to do about the PT referral? Pt states that you & he discussed holding off?  Follow-up by: Candi Leash,  Nov 01, 2009 10:15 AM  Additional Follow-up for Phone Call Additional follow up Details #1::        sorry  physical therapy is from 10/09/2008 - i should have cancelled it out of the system as it is over 46 year old thanks GI referral Additional Follow-up by: Lehman Prom FNP,  Nov 01, 2009 10:57 AM

## 2010-07-26 NOTE — Assessment & Plan Note (Signed)
Summary: Hoarseness/GERD   Vital Signs:  Patient profile:   55 year old male Weight:      297.6 pounds BMI:     41.66 BSA:     2.50 Temp:     97.3 degrees F oral Pulse rate:   61 / minute Pulse rhythm:   regular Resp:     16 per minute BP sitting:   129 / 81  (left arm) Cuff size:   large  Vitals Entered ByPatty Sermons (September 23, 2009 9:12 AM) CC: sore throat x 11/2 weekr, Hypertension Management, Lipid Management Is Patient Diabetic? No Pain Assessment Patient in pain? yes     Location: throat Intensity: 6-7 Type: raw  Does patient need assistance? Functional Status Self care Ambulation Normal   Primary Care Provider:  None  CC:  sore throat x 11/2 weekr, Hypertension Management, and Lipid Management.  History of Present Illness:  Pt into the office with complaints of hoarseness Started 1 week ago "Feels like raw meat in my throat" No fever No headache No earaches No cough or congestion Reports that for the past 3 months he has midepigastric and abdominal pain all the time.  started intermittently but now is constant.  Notes some changes in bowel movements - not daily as before and becoming more formed..  No stool in family. Previous EGD and colonscopy done about 3 years ago. Paternal uncle with colon cancer Tobacco - still smoking less than 1 pack per day but over the past 3 days he has only been smoking 2-3   Hypertension History:      He denies headache, chest pain, and palpitations.  He notes no problems with any antihypertensive medication side effects.  Pt is taking meds as ordered.        Positive major cardiovascular risk factors include male age 32 years old or older, hyperlipidemia, hypertension, and current tobacco user.  Negative major cardiovascular risk factors include negative family history for ischemic heart disease.        Positive history for target organ damage include prior stroke (or TIA).    Lipid Management History:  Positive NCEP/ATP III risk factors include male age 44 years old or older, HDL cholesterol less than 40, current tobacco user, hypertension, and prior stroke (or TIA).  Negative NCEP/ATP III risk factors include no family history for ischemic heart disease.      Habits & Providers  Alcohol-Tobacco-Diet     Alcohol type: 2 times a year - beer     Tobacco Status: current     Tobacco Counseling: to quit use of tobacco products     Cigarette Packs/Day: 5 cigs per day     Year Started: age 61  Exercise-Depression-Behavior     Drug Use: no     Seat Belt Use: 100     Sun Exposure: occasionally  Allergies (verified): 1)  ! Morphine  Review of Systems General:  Denies fever. ENT:  Complains of sore throat; denies earache and nasal congestion. CV:  Denies chest pain or discomfort. Resp:  Denies cough and shortness of breath. GI:  Complains of abdominal pain and change in bowel habits; denies nausea and vomiting.  Physical Exam  General:  alert.   Head:  normocephalic.   Eyes:  pupils round.   Mouth:  excoriation Lungs:  normal breath sounds.   Heart:  normal rate and regular rhythm.   Abdomen:  obese Msk:  up to the exam table Neurologic:  alert & oriented X3.  Impression & Recommendations:  Problem # 1:  HOARSENESS (ZOX-096.04) May be due to #2 will treat with PPI and see if symptoms improve Orders: Rapid HIV  (54098) Rapid Strep (11914)  Problem # 2:  GERD (ICD-530.81) will start PPI and order magic mouthwash advise pt of dx His updated medication list for this problem includes:    Nexium 40 Mg Cpdr (Esomeprazole magnesium) ..... One caspule by mouth daily before breakfast  Complete Medication List: 1)  Aggrenox 25-200 Mg Cp12 (Aspirin-dipyridamole) .... Two times a day 2)  Duragesic-50 50 Mcg/hr Pt72 (Fentanyl) .Marland Kitchen.. 1 patch every 3 day (72 hrs) **rx by dr. Ethelene Hal** 3)  Percocet 10-650 Mg Tabs (Oxycodone-acetaminophen) .... As needed  **rx by dr. Ethelene Hal** 4)   Verapamil Hcl 80 Mg Tabs (Verapamil hcl) .... Take one tablet 3 times daily *dr Rana Snare 5)  Simvastatin 40 Mg Tabs (Simvastatin) .Marland Kitchen.. 1 tablet by mouth nightly for cholesterol 6)  Loratadine 10 Mg Tabs (Loratadine) .... One tablet by mouth daily for allergies 7)  Proventil Hfa 108 (90 Base) Mcg/act Aers (Albuterol sulfate) .... 2 puffs every 6 hours as needed for shortness of breath 8)  Zestoretic 20-25 Mg Tabs (Lisinopril-hydrochlorothiazide) .... One tablet by mouth daily for blood pressure 9)  Flonase 50 Mcg/act Susp (Fluticasone propionate) .Marland Kitchen.. 1 spray in each nostril two times a day 10)  Nexium 40 Mg Cpdr (Esomeprazole magnesium) .... One caspule by mouth daily before breakfast 11)  Magic Mouthwash (diphenhydramine/mylanta/sucralfate)  .Marland Kitchen.. 1 teaspoon  swish and swallow three times per day before meals  Other Orders: UA Dipstick w/o Micro (manual) (78295) T-Urine Microalbumin w/creat. ratio [phone]) T-Comprehensive Metabolic Panel [phone]) T-PSA ([id number]) T-TSH [phone])  Hypertension Assessment/Plan:      The patient's hypertensive risk group is category C: Target organ damage and/or diabetes.  His calculated 10 year risk of coronary heart disease is 9 %.  Today's blood pressure is 129/81.  His blood pressure goal is < 140/90.  Lipid Assessment/Plan:      Based on NCEP/ATP III, the patient's risk factor category is "history of coronary disease, peripheral vascular disease, cerebrovascular disease, or aortic aneurysm along with either diabetes, current smoker, or LDL > 130 plus HDL < 40 plus triglycerides > 200".  The patient's lipid goals are as follows: Total cholesterol goal is 200; LDL cholesterol goal is 70; HDL cholesterol goal is 40; Triglyceride goal is 150.    Patient Instructions: 1)  Hoarseness and symptoms may be due to acid and stomach problems 2)  Take nexium 40mg  by mouth daily - before breakfast 3)  Use the magic mouthwash - 1 teaspoon before meals     4)  Avoid any tomato sauce or spicy foods.  Also caffinated beverages 5)  Follow up in 2 weeks to assess hoarseness.  If symptoms continue will need EGD. Prescriptions: MAGIC MOUTHWASH (DIPHENHYDRAMINE/MYLANTA/SUCRALFATE) 1 teaspoon  swish and swallow three times per day before meals  #173ml x 0   Entered and Authorized by:   Lehman Prom FNP   Signed by:   Lehman Prom FNP on 09/23/2009   Method used:   Print then Give to Patient   RxID:   [id number] NEXIUM 40 MG CPDR (ESOMEPRAZOLE MAGNESIUM) One caspule by mouth daily before breakfast  #30q x 5   Entered and Authorized by:   Lehman Prom FNP   Signed by:   Lehman Prom FNP on 09/23/2009   Method used:   Print then Give to Patient   RxID:   [id number]  Laboratory Results   Urine Tests  Date/Time Received: September 23, 2009 9:30 AM   Routine Urinalysis   Color: yellow Appearance: Clear Glucose: negative   (Normal Range: Negative) Bilirubin: negative   (Normal Range: Negative) Ketone: negative   (Normal Range: Negative) Spec. Gravity: >=1.030   (Normal Range: 1.003-1.035) Blood: negative   (Normal Range: Negative) pH: 5.5   (Normal Range: 5.0-8.0) Protein: negative   (Normal Range: Negative) Urobilinogen: 0.2   (Normal Range: 0-1) Nitrite: negative   (Normal Range: Negative) Leukocyte Esterace: negative   (Normal Range: Negative)    Date/Time Received: September 23, 2009 9:40 AM   Other Tests  Rapid Strep: negative Rapid HIV: negative    Laboratory Results   Urine Tests    Routine Urinalysis   Color: yellow Appearance: Clear Glucose: negative   (Normal Range: Negative) Bilirubin: negative   (Normal Range: Negative) Ketone: negative   (Normal Range: Negative) Spec. Gravity: >=1.030   (Normal Range: 1.003-1.035) Blood: negative   (Normal Range: Negative) pH: 5.5   (Normal Range: 5.0-8.0) Protein: negative   (Normal Range: Negative) Urobilinogen: 0.2   (Normal Range: 0-1) Nitrite: negative    (Normal Range: Negative) Leukocyte Esterace: negative   (Normal Range: Negative)      Other Tests  Rapid Strep: negative Rapid HIV: negative

## 2010-07-26 NOTE — Progress Notes (Signed)
Summary: Pain Clinic referral  Phone Note From Other Clinic   Caller: Referral Coordinator Summary of Call: [facility] wants to know why the Pt is not following up with Dr.Ramos if Duragesic & percocet were written by him? Also wants office notes supporting the referral.I don't see any Initial call taken by: Candi Leash,  January 07, 2010 10:36 AM  Follow-up for Phone Call        I don't have any recent office notes from Dr. Ethelene Hal - pt has been going there for years even prior to establishing with this office. Pt reported to this office that he has an outstanding balance that he must pay before they will continue to see him. you may have to call pt and have him sign a release of records form Follow-up by: Lehman Prom FNP,  January 10, 2010 8:16 AM  Additional Follow-up for Phone Call Additional follow up Details #1::        [facility] will do Additional Follow-up by: Candi Leash,  January 10, 2010 8:31 AM

## 2010-07-26 NOTE — Progress Notes (Signed)
Summary: Dermatology referral  Phone Note Outgoing Call   Summary of Call: Refer to dermatology for removal of skin tags multiple > 20 under bilateral axilla and left inner thigh Initial call taken by: Lehman Prom FNP,  December 15, 2009 7:58 AM

## 2010-07-26 NOTE — Letter (Signed)
[facility]: [facility]   Imported By: Arta Bruce 02/09/2010 10:12:08  _____________________________________________________________________  External Attachment:    Type:   Image     Comment:   External Document

## 2010-07-26 NOTE — Letter (Signed)
Summary: [facility]   Imported By: Arta Bruce 02/24/2010 11:12:35  _____________________________________________________________________  External Attachment:    Type:   Image     Comment:   External Document

## 2010-07-26 NOTE — Assessment & Plan Note (Signed)
Summary: GERD/Hoarseness   Vital Signs:  Patient profile:   55 year old male Weight:      298 pounds Temp:     97.8 degrees F Pulse rate:   82 / minute Pulse rhythm:   regular Resp:     20 per minute BP sitting:   134 / 77  (left arm) Cuff size:   large  Vitals Entered By: Vesta Mixer CMA (October 07, 2009 10:16 AM) CC: f/u on hoarsness.  Still has it if he talks more than 5 min.  Pain still in top part of stomach.  Can't stay long son was in an accident school. Is Patient Diabetic? No Pain Assessment Patient in pain? yes     Location: rt hip/back Intensity: 8  Does patient need assistance? Ambulation Normal   Primary Care Provider:  None  CC:  f/u on hoarsness.  Still has it if he talks more than 5 min.  Pain still in top part of stomach.  Can't stay long son was in an accident school.Marland Kitchen  History of Present Illness:  Pt into the office for f/u - seen 2 weeks ago for hoarseness(see previous note for full H & P) Pt was started on nexium which he has been taking in the morning. Magic Mouthwash was also given. Voice has improved as during last visit he was completely hoarse. Now it is only after talking for about 5 minutes in a conversation that he notices the hoarseness. Abd pain continues in mid epigastric area Denies any nausea and vomiting handout given and pt has reviewed and tried to avoid foods that cause more problems 1 pound weight gain since last visit  Allergies (verified): 1)  ! Morphine  Review of Systems ENT:  Complains of hoarseness; some improved since last visit. CV:  Denies chest pain or discomfort. Resp:  Denies cough. GI:  Denies nausea and vomiting.  Physical Exam  General:  alert.   Head:  normocephalic.   Neurologic:  alert & oriented X3.   Psych:  Oriented X3.     Impression & Recommendations:  Problem # 1:  HOARSENESS (ZOX-096.04)  improved some with the PPI and avoiding causative agents will still refer to GI for possible  EGD  Orders: Gastroenterology Referral (GI)  Problem # 2:  GERD (ICD-530.81)  His updated medication list for this problem includes:    Nexium 40 Mg Cpdr (Esomeprazole magnesium) ..... One caspule by mouth daily before breakfast  Orders: Gastroenterology Referral (GI)  Problem # 3:  OBESITY (ICD-278.00) up one pound since last visit pt still needs to lose weight  Complete Medication List: 1)  Aggrenox 25-200 Mg Cp12 (Aspirin-dipyridamole) .... Two times a day 2)  Duragesic-50 50 Mcg/hr Pt72 (Fentanyl) .Marland Kitchen.. 1 patch every 3 day (72 hrs) **rx by dr. Ethelene Hal** 3)  Percocet 10-650 Mg Tabs (Oxycodone-acetaminophen) .... As needed  **rx by dr. Ethelene Hal** 4)  Verapamil Hcl 80 Mg Tabs (Verapamil hcl) .... Take one tablet 3 times daily *dr Rana Snare 5)  Simvastatin 40 Mg Tabs (Simvastatin) .Marland Kitchen.. 1 tablet by mouth nightly for cholesterol 6)  Loratadine 10 Mg Tabs (Loratadine) .... One tablet by mouth daily for allergies 7)  Proventil Hfa 108 (90 Base) Mcg/act Aers (Albuterol sulfate) .... 2 puffs every 6 hours as needed for shortness of breath 8)  Zestoretic 20-25 Mg Tabs (Lisinopril-hydrochlorothiazide) .... One tablet by mouth daily for blood pressure 9)  Flonase 50 Mcg/act Susp (Fluticasone propionate) .Marland Kitchen.. 1 spray in each nostril two times a day  10)  Nexium 40 Mg Cpdr (Esomeprazole magnesium) .... One caspule by mouth daily before breakfast 11)  Magic Mouthwash (diphenhydramine/mylanta/sucralfate)  .Marland Kitchen.. 1 teaspoon  swish and swallow three times per day before meals  Patient Instructions: 1)  This office will refer you to Gastroenterology for evaluation. 2)  You may need endoscopy. 3)  If so then you will need to stop your aggrenox before the procedure.

## 2010-07-26 NOTE — Progress Notes (Signed)
Summary: Pain clinic  Phone Note Outgoing Call   Summary of Call: refer to pain clinic  Pt is already on chronic narcotics from orthopedics (see previous phone note) and is looking to be referred to a pain clinic that will continue narcotics (apparently he is having some issues with an outstanding balance from ortho which is why he is unable to continue to get meds from that office) Initial call taken by: Lehman Prom FNP,  December 28, 2009 2:04 PM

## 2010-07-27 ENCOUNTER — Telehealth (INDEPENDENT_AMBULATORY_CARE_PROVIDER_SITE_OTHER): Payer: Self-pay | Admitting: Nurse Practitioner

## 2010-07-28 NOTE — Progress Notes (Signed)
Summary: Query:  Refill lisinopril-HCTZ?  Phone Note Outgoing Call   Summary of Call: Last seen 11/2009 with no f/u provider appt.  Refill lisinopril-HCTZ per protocol? Initial call taken by: Dutch Quint RN,  July 05, 2010 8:59 AM  Follow-up for Phone Call        yes, ok to refill for 6 months.  Follow-up by: Lehman Prom FNP,  July 05, 2010 12:49 PM  Additional Follow-up for Phone Call Additional follow up Details #1::        Noted.  Refill completed.  Dutch Quint RN  July 05, 2010 12:51 PM

## 2010-08-03 ENCOUNTER — Ambulatory Visit (HOSPITAL_BASED_OUTPATIENT_CLINIC_OR_DEPARTMENT_OTHER): Payer: Medicaid Other

## 2010-08-03 ENCOUNTER — Encounter: Payer: Medicaid Other | Attending: Physical Medicine & Rehabilitation

## 2010-08-03 DIAGNOSIS — M549 Dorsalgia, unspecified: Secondary | ICD-10-CM | POA: Insufficient documentation

## 2010-08-03 DIAGNOSIS — M25559 Pain in unspecified hip: Secondary | ICD-10-CM | POA: Insufficient documentation

## 2010-08-03 DIAGNOSIS — M5137 Other intervertebral disc degeneration, lumbosacral region: Secondary | ICD-10-CM

## 2010-08-03 DIAGNOSIS — R209 Unspecified disturbances of skin sensation: Secondary | ICD-10-CM | POA: Insufficient documentation

## 2010-08-03 DIAGNOSIS — M47817 Spondylosis without myelopathy or radiculopathy, lumbosacral region: Secondary | ICD-10-CM

## 2010-08-03 DIAGNOSIS — M519 Unspecified thoracic, thoracolumbar and lumbosacral intervertebral disc disorder: Secondary | ICD-10-CM | POA: Insufficient documentation

## 2010-08-03 DIAGNOSIS — M76899 Other specified enthesopathies of unspecified lower limb, excluding foot: Secondary | ICD-10-CM | POA: Insufficient documentation

## 2010-08-03 DIAGNOSIS — R262 Difficulty in walking, not elsewhere classified: Secondary | ICD-10-CM | POA: Insufficient documentation

## 2010-08-03 DIAGNOSIS — Z96649 Presence of unspecified artificial hip joint: Secondary | ICD-10-CM | POA: Insufficient documentation

## 2010-08-03 NOTE — Progress Notes (Signed)
Summary: Query:  Refill loratadine?  Phone Note Outgoing Call   Summary of Call: Last seen 11/2009.  Refill loratadine per protocol? Initial call taken by: Dutch Quint RN,  July 27, 2010 10:26 AM  Follow-up for Phone Call        yes, ok to refill Follow-up by: Lehman Prom FNP,  July 27, 2010 10:40 AM  Additional Follow-up for Phone Call Additional follow up Details #1::        Noted.  Refil completed.  Dutch Quint RN  July 27, 2010 10:54 AM

## 2010-08-23 ENCOUNTER — Telehealth (INDEPENDENT_AMBULATORY_CARE_PROVIDER_SITE_OTHER): Payer: Self-pay | Admitting: Nurse Practitioner

## 2010-09-01 NOTE — Progress Notes (Signed)
Summary: Query:  Refill Nexium?  Phone Note Outgoing Call   Summary of Call:  Last seen 11/2009.  No f/u.  Refill Nexium per protocol?  Last refilled 08/2009 x5 refills. Initial call taken by: Dutch Quint RN,  August 23, 2010 12:17 PM  Follow-up for Phone Call        yes, ok to refill Follow-up by: Lehman Prom FNP,  August 23, 2010 6:05 PM  Additional Follow-up for Phone Call Additional follow up Details #1::        Noted.  Refill completed. Dutch Quint RN  August 25, 2010 10:04 AM

## 2010-09-02 ENCOUNTER — Ambulatory Visit (HOSPITAL_BASED_OUTPATIENT_CLINIC_OR_DEPARTMENT_OTHER): Payer: Medicaid Other | Admitting: Physical Medicine & Rehabilitation

## 2010-09-02 ENCOUNTER — Encounter: Payer: Medicaid Other | Attending: Physical Medicine & Rehabilitation

## 2010-09-02 DIAGNOSIS — M5137 Other intervertebral disc degeneration, lumbosacral region: Secondary | ICD-10-CM

## 2010-09-02 DIAGNOSIS — R262 Difficulty in walking, not elsewhere classified: Secondary | ICD-10-CM | POA: Insufficient documentation

## 2010-09-02 DIAGNOSIS — Z96649 Presence of unspecified artificial hip joint: Secondary | ICD-10-CM | POA: Insufficient documentation

## 2010-09-02 DIAGNOSIS — M519 Unspecified thoracic, thoracolumbar and lumbosacral intervertebral disc disorder: Secondary | ICD-10-CM | POA: Insufficient documentation

## 2010-09-02 DIAGNOSIS — R209 Unspecified disturbances of skin sensation: Secondary | ICD-10-CM | POA: Insufficient documentation

## 2010-09-02 DIAGNOSIS — M545 Low back pain: Secondary | ICD-10-CM

## 2010-09-02 DIAGNOSIS — M76899 Other specified enthesopathies of unspecified lower limb, excluding foot: Secondary | ICD-10-CM | POA: Insufficient documentation

## 2010-09-02 DIAGNOSIS — M25559 Pain in unspecified hip: Secondary | ICD-10-CM | POA: Insufficient documentation

## 2010-09-02 DIAGNOSIS — G8928 Other chronic postprocedural pain: Secondary | ICD-10-CM

## 2010-09-02 DIAGNOSIS — M549 Dorsalgia, unspecified: Secondary | ICD-10-CM | POA: Insufficient documentation

## 2010-09-08 LAB — URINALYSIS, ROUTINE W REFLEX MICROSCOPIC
Bilirubin Urine: NEGATIVE
Ketones, ur: NEGATIVE mg/dL
Nitrite: NEGATIVE
pH: 7 (ref 5.0–8.0)

## 2010-09-08 LAB — URINE MICROSCOPIC-ADD ON

## 2010-09-09 LAB — URINALYSIS, ROUTINE W REFLEX MICROSCOPIC
Hgb urine dipstick: NEGATIVE
Ketones, ur: 15 mg/dL — AB
Nitrite: NEGATIVE
Protein, ur: NEGATIVE mg/dL
Specific Gravity, Urine: 1.021 (ref 1.005–1.030)
Specific Gravity, Urine: 1.031 — ABNORMAL HIGH (ref 1.005–1.030)
Urobilinogen, UA: 1 mg/dL (ref 0.0–1.0)
pH: 7 (ref 5.0–8.0)

## 2010-09-09 LAB — URINE MICROSCOPIC-ADD ON

## 2010-09-10 LAB — BASIC METABOLIC PANEL
CO2: 25 mEq/L (ref 19–32)
Chloride: 109 mEq/L (ref 96–112)
GFR calc Af Amer: >60 mL/min (ref >60)
Sodium: 140 mEq/L (ref 135–145)

## 2010-09-10 LAB — URINALYSIS, ROUTINE W REFLEX MICROSCOPIC
Bilirubin Urine: NEGATIVE
Glucose, UA: NEGATIVE mg/dL
Ketones, ur: NEGATIVE mg/dL
Nitrite: NEGATIVE
Protein, ur: NEGATIVE mg/dL
Specific Gravity, Urine: 1.022 (ref 1.005–1.030)
Urobilinogen, UA: 0.2 mg/dL (ref 0.0–1.0)
Urobilinogen, UA: 0.2 mg/dL (ref 0.0–1.0)

## 2010-09-10 LAB — POCT I-STAT, CHEM 8
Calcium, Ion: 1.12 mmol/L (ref 1.12–1.32)
Glucose, Bld: 84 mg/dL (ref 70–99)
HCT: 51 % (ref 39.0–52.0)
Hemoglobin: 17.3 g/dL — ABNORMAL HIGH (ref 13.0–17.0)
Potassium: 3.5 mEq/L (ref 3.5–5.1)

## 2010-09-10 LAB — URINE MICROSCOPIC-ADD ON

## 2010-09-11 LAB — CBC
HCT: 47.6 % (ref 39.0–52.0)
Hemoglobin: 16 g/dL (ref 13.0–17.0)
MCH: 30.7 pg (ref 26.0–34.0)
MCHC: 33.5 g/dL (ref 30.0–36.0)

## 2010-09-11 LAB — URINE MICROSCOPIC-ADD ON

## 2010-09-11 LAB — BASIC METABOLIC PANEL
CO2: 26 mEq/L (ref 19–32)
GFR calc non Af Amer: >60 mL/min (ref >60)
Glucose, Bld: 89 mg/dL (ref 70–99)
Potassium: 4.4 mEq/L (ref 3.5–5.1)
Sodium: 141 mEq/L (ref 135–145)

## 2010-09-11 LAB — URINALYSIS, ROUTINE W REFLEX MICROSCOPIC
Glucose, UA: NEGATIVE mg/dL
Specific Gravity, Urine: 1.029 (ref 1.005–1.030)
Urobilinogen, UA: 1 mg/dL (ref 0.0–1.0)

## 2010-09-11 LAB — DIFFERENTIAL
Basophils Relative: 1 % (ref 0–1)
Lymphs Abs: 3.3 10*3/uL (ref 0.7–4.0)
Monocytes Absolute: 0.7 10*3/uL (ref 0.1–1.0)
Monocytes Relative: 5 % (ref 3–12)
Neutro Abs: 8.2 10*3/uL — ABNORMAL HIGH (ref 1.7–7.7)

## 2010-09-16 NOTE — Assessment & Plan Note (Signed)
Scott Holden returns today.  CHIEF COMPLAINT:  Back pain, going into the right hip area.  He has a prior history of right hip redo.  Formerly managed by Dr. Sheran Luz.  MRI shows lumbar degenerative disk at L4-5.  His pain medications include Percocet 10/325 one p.o. q.i.d. as well as fentanyl patch 50 mcg q.48 h.  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  Pain score 7/10.  Pain worse with walking, bending, sitting, and standing, improves with medications and injections.  He did have some good relief with left trochanteric bursa injection.  REVIEW OF SYSTEMS:  Positive for numbness.  PRIMARY CARE PHYSICIAN:  Maryelizabeth Rowan, MD  SOCIAL HISTORY:  Divorced, lives with his son, smokes pack per day. Denies any alcohol or drug use.  Blood pressure 171/77, pulse 109, respirations 18, satting 95% on room air.  He is seeing his primary care physician for the first time next week.  On examination, he has no tenderness in the trochanteric bursa.  He has good range of motion with hip internal and external rotation.  He does have mildly antalgic gait.  He has had right total hip replacement, favors this side.  His back is nontender to palpation in lumbar paraspinals.  Straight leg raising test is negative.  He has normal strength in the lower extremities.  IMPRESSION: 1. Lumbosacral disk disorder. 2. Chronic postoperative pain, right total hip replacement.  PLAN: 1. We will send him to fitness focus, [place], [address], two     times week for 2 weeks and get him started on exercise program that     he will continue on his own. 2. [facility] will instruct with weight loss, diet. 3. Primary care, follow up with Dr. Maryelizabeth Rowan. 4. We will see him back in [facility] next month.     Continue current pain medications.   Reinject trochanteric bursa     p.r.n.     Erick Colace, M.D. Electronically Signed    AEK/MedQ D:  09/02/2010 17:39:47  T:  09/02/2010 21:31:14  Job #:  [id number]  cc:   Maryelizabeth Rowan, M.D. Fax: [phone]  [facility], [address]

## 2010-09-21 ENCOUNTER — Ambulatory Visit: Payer: Medicaid Other | Admitting: Rehabilitative and Restorative Service Providers"

## 2010-09-27 ENCOUNTER — Ambulatory Visit: Payer: Medicaid Other

## 2010-09-28 LAB — URINALYSIS, ROUTINE W REFLEX MICROSCOPIC
Glucose, UA: NEGATIVE mg/dL
Hgb urine dipstick: NEGATIVE
Ketones, ur: 15 mg/dL — AB
Specific Gravity, Urine: 1.026 (ref 1.005–1.030)
pH: 7.5 (ref 5.0–8.0)

## 2010-09-28 LAB — URINE MICROSCOPIC-ADD ON

## 2010-09-28 LAB — POCT I-STAT 4, (NA,K, GLUC, HGB,HCT)
Potassium: 4.8 mEq/L (ref 3.5–5.1)
Sodium: 138 mEq/L (ref 135–145)

## 2010-09-29 LAB — URINALYSIS, ROUTINE W REFLEX MICROSCOPIC
Ketones, ur: NEGATIVE mg/dL
Nitrite: NEGATIVE
Nitrite: POSITIVE — AB
Protein, ur: 100 mg/dL — AB
Specific Gravity, Urine: 1.016 (ref 1.005–1.030)
Urobilinogen, UA: 0.2 mg/dL (ref 0.0–1.0)
pH: 6.5 (ref 5.0–8.0)

## 2010-09-29 LAB — DIFFERENTIAL
Eosinophils Absolute: 0.1 10*3/uL (ref 0.0–0.7)
Eosinophils Relative: 1 % (ref 0–5)
Lymphocytes Relative: 16 % (ref 12–46)
Lymphs Abs: 3 10*3/uL (ref 0.7–4.0)
Monocytes Absolute: 0.5 10*3/uL (ref 0.1–1.0)

## 2010-09-29 LAB — URINE CULTURE: Culture: NO GROWTH

## 2010-09-29 LAB — CBC
HCT: 51.9 % (ref 39.0–52.0)
Hemoglobin: 17.1 g/dL — ABNORMAL HIGH (ref 13.0–17.0)
MCV: 94.6 fL (ref 78.0–100.0)
Platelets: 243 10*3/uL (ref 150–400)
WBC: 18.6 10*3/uL — ABNORMAL HIGH (ref 4.0–10.5)

## 2010-09-29 LAB — URINE MICROSCOPIC-ADD ON

## 2010-09-29 LAB — BASIC METABOLIC PANEL
BUN: 9 mg/dL (ref 6–23)
Chloride: 100 mEq/L (ref 96–112)
GFR calc non Af Amer: >60 mL/min (ref >60)
Glucose, Bld: 120 mg/dL — ABNORMAL HIGH (ref 70–99)
Potassium: 4.1 mEq/L (ref 3.5–5.1)
Sodium: 136 mEq/L (ref 135–145)

## 2010-09-30 LAB — URINALYSIS, ROUTINE W REFLEX MICROSCOPIC
Ketones, ur: NEGATIVE mg/dL
Nitrite: NEGATIVE
Protein, ur: NEGATIVE mg/dL
Urobilinogen, UA: 1 mg/dL (ref 0.0–1.0)
pH: 5.5 (ref 5.0–8.0)

## 2010-10-02 LAB — DIFFERENTIAL
Basophils Relative: 1 % (ref 0–1)
Eosinophils Absolute: 0.2 10*3/uL (ref 0.0–0.7)
Eosinophils Relative: 2 % (ref 0–5)
Monocytes Absolute: 0.7 10*3/uL (ref 0.1–1.0)
Monocytes Relative: 6 % (ref 3–12)

## 2010-10-02 LAB — CBC
HCT: 48.9 % (ref 39.0–52.0)
Hemoglobin: 15.8 g/dL (ref 13.0–17.0)
MCHC: 32.3 g/dL (ref 30.0–36.0)
MCV: 91.7 fL (ref 78.0–100.0)
WBC: 11.6 10*3/uL — ABNORMAL HIGH (ref 4.0–10.5)

## 2010-10-02 LAB — BASIC METABOLIC PANEL
BUN: 12 mg/dL (ref 6–23)
Calcium: 9.3 mg/dL (ref 8.4–10.5)
Creatinine, Ser: 0.9 mg/dL (ref 0.4–1.5)
GFR calc non Af Amer: >60 mL/min (ref >60)
Glucose, Bld: 91 mg/dL (ref 70–99)
Potassium: 4 mEq/L (ref 3.5–5.1)

## 2010-10-02 LAB — POCT CARDIAC MARKERS: CKMB, poc: 5 ng/mL (ref 1.0–8.0)

## 2010-10-05 ENCOUNTER — Ambulatory Visit: Payer: Medicaid Other | Admitting: Neurosurgery

## 2010-10-05 ENCOUNTER — Encounter: Payer: Medicaid Other | Attending: Neurosurgery | Admitting: Neurosurgery

## 2010-10-05 DIAGNOSIS — R209 Unspecified disturbances of skin sensation: Secondary | ICD-10-CM | POA: Insufficient documentation

## 2010-10-05 DIAGNOSIS — M25559 Pain in unspecified hip: Secondary | ICD-10-CM | POA: Insufficient documentation

## 2010-10-05 DIAGNOSIS — M545 Low back pain, unspecified: Secondary | ICD-10-CM | POA: Insufficient documentation

## 2010-10-05 DIAGNOSIS — M1610 Unilateral primary osteoarthritis, unspecified hip: Secondary | ICD-10-CM

## 2010-10-05 DIAGNOSIS — Z96649 Presence of unspecified artificial hip joint: Secondary | ICD-10-CM | POA: Insufficient documentation

## 2010-10-05 DIAGNOSIS — Z8673 Personal history of transient ischemic attack (TIA), and cerebral infarction without residual deficits: Secondary | ICD-10-CM | POA: Insufficient documentation

## 2010-10-05 DIAGNOSIS — F17200 Nicotine dependence, unspecified, uncomplicated: Secondary | ICD-10-CM | POA: Insufficient documentation

## 2010-10-05 DIAGNOSIS — I10 Essential (primary) hypertension: Secondary | ICD-10-CM | POA: Insufficient documentation

## 2010-10-05 DIAGNOSIS — R42 Dizziness and giddiness: Secondary | ICD-10-CM | POA: Insufficient documentation

## 2010-10-05 LAB — URINALYSIS, ROUTINE W REFLEX MICROSCOPIC
Glucose, UA: NEGATIVE mg/dL
Hgb urine dipstick: NEGATIVE
Specific Gravity, Urine: 1.017 (ref 1.005–1.030)
Urobilinogen, UA: 1 mg/dL (ref 0.0–1.0)

## 2010-10-05 LAB — BASIC METABOLIC PANEL
CO2: 29 mEq/L (ref 19–32)
Calcium: 9.1 mg/dL (ref 8.4–10.5)
GFR calc Af Amer: >60 mL/min (ref >60)
GFR calc non Af Amer: >60 mL/min (ref >60)
Sodium: 142 mEq/L (ref 135–145)

## 2010-11-01 ENCOUNTER — Emergency Department (INDEPENDENT_AMBULATORY_CARE_PROVIDER_SITE_OTHER): Payer: Medicaid Other

## 2010-11-01 ENCOUNTER — Emergency Department (HOSPITAL_BASED_OUTPATIENT_CLINIC_OR_DEPARTMENT_OTHER)
Admission: EM | Admit: 2010-11-01 | Discharge: 2010-11-01 | Disposition: A | Discharge status: Home / Self Care | Payer: Medicaid Other | Attending: Emergency Medicine | Admitting: Emergency Medicine

## 2010-11-01 DIAGNOSIS — G8929 Other chronic pain: Secondary | ICD-10-CM | POA: Insufficient documentation

## 2010-11-01 DIAGNOSIS — K219 Gastro-esophageal reflux disease without esophagitis: Secondary | ICD-10-CM | POA: Insufficient documentation

## 2010-11-01 DIAGNOSIS — Z79899 Other long term (current) drug therapy: Secondary | ICD-10-CM | POA: Insufficient documentation

## 2010-11-01 DIAGNOSIS — F17200 Nicotine dependence, unspecified, uncomplicated: Secondary | ICD-10-CM | POA: Insufficient documentation

## 2010-11-01 DIAGNOSIS — R109 Unspecified abdominal pain: Secondary | ICD-10-CM | POA: Insufficient documentation

## 2010-11-01 DIAGNOSIS — Z8679 Personal history of other diseases of the circulatory system: Secondary | ICD-10-CM | POA: Insufficient documentation

## 2010-11-01 DIAGNOSIS — R319 Hematuria, unspecified: Secondary | ICD-10-CM

## 2010-11-01 DIAGNOSIS — I10 Essential (primary) hypertension: Secondary | ICD-10-CM | POA: Insufficient documentation

## 2010-11-01 LAB — URINALYSIS, ROUTINE W REFLEX MICROSCOPIC
Hgb urine dipstick: NEGATIVE
Nitrite: NEGATIVE
Specific Gravity, Urine: 1.019 (ref 1.005–1.030)
Urobilinogen, UA: 0.2 mg/dL (ref 0.0–1.0)

## 2010-11-02 ENCOUNTER — Encounter: Payer: Medicaid Other | Attending: Neurosurgery | Admitting: Neurosurgery

## 2010-11-02 DIAGNOSIS — Z96649 Presence of unspecified artificial hip joint: Secondary | ICD-10-CM | POA: Insufficient documentation

## 2010-11-02 DIAGNOSIS — M25559 Pain in unspecified hip: Secondary | ICD-10-CM

## 2010-11-02 DIAGNOSIS — R209 Unspecified disturbances of skin sensation: Secondary | ICD-10-CM | POA: Insufficient documentation

## 2010-11-02 DIAGNOSIS — M545 Low back pain, unspecified: Secondary | ICD-10-CM | POA: Insufficient documentation

## 2010-11-02 DIAGNOSIS — M5430 Sciatica, unspecified side: Secondary | ICD-10-CM

## 2010-11-02 DIAGNOSIS — G894 Chronic pain syndrome: Secondary | ICD-10-CM

## 2010-11-03 NOTE — Assessment & Plan Note (Signed)
HISTORY:  This is a patient who has been seen for bilateral back pain and right hip pain for sometime.  He has a history of right hip arthroplasty with revision.  He has seen Dr. Ethelene Hal for his low back. Today, he states his pain is much better.  He is rating it in the 5-7. He states he feels better and he does have pain that is sharp and burning and some tingling and aching.  General activity level is about 3- 4.  Pain is worse in the morning and evening.  Sleep patterns are fair. Most activities aggravate his pain.  Pacing activities, therapy, medications, and injections tend to help.  He can walk about 15 minutes at a time and doing better with that.  He walks without assistance now. He can climb steps and he drives.  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  REVIEW OF SYSTEMS:  Within normal limits.  He does have some numbness and tingling and trouble with walking from time-to-time.  PAST MEDICAL HISTORY AND SOCIAL HISTORY:  Unchanged.  FAMILY HISTORY:  Significant for stroke, hypertension, and diabetes.  PHYSICAL EXAMINATION:  VITAL SIGNS:  His blood pressure is 167/93, respirations are 20, pulse 74, and O2 sats 94% on room air. NEUROLOGIC:  He is 5/5 in lower extremities with his strength. Sensation is intact.  He does have some numbness and tingling in that leg on the lateral aspect.  Range of motion is somewhat diminished with flexion and extension.  He is correcting his gait and walking much better. PSYCHIATRIC:  His affect is bright.  Constitutionally, he is within normal limits.  There are no signs of aberrant behavior.  ASSESSMENT:  Chronic low back pain with right hip pain due to revision.  PLAN: 1. We will refill his oxycodone 50 mg 1 p.o. q.i.d. 120 with no     refills. 2. Filled his fentanyl patch 50 mcg 1 t.i.d. q.48 h. 15 with no     refill. 3. He will follow up with Dr. Wynn Banker or myself in 1 month.  His     questions were encouraged  and answered.     Joeanna Howdyshell L. Blima Dessert Electronically Signed    RLW/MedQ D:  11/02/2010 14:17:03  T:  11/03/2010 03:00:54  Job #:  [id number]

## 2010-11-08 NOTE — Op Note (Signed)
NAMEKUNTA, HILLEARY NO.:  [id number]   MEDICAL RECORD NO.:  [id number]          PATIENT TYPE:  AMB   LOCATION:  [facility]                         FACILITY:  [facility]   PHYSICIAN:  Excell Seltzer. Annabell Howells, M.D.    DATE OF BIRTH:  [birth year]   DATE OF PROCEDURE:  12/06/2006  DATE OF DISCHARGE:                               OPERATIVE REPORT   PROCEDURE:  Right ureteroscopic stone extraction with holmium laser-  tripsy and insertion of right double-J stent.   PREOPERATIVE DIAGNOSIS:  Right midureteral stone.   POSTOPERATIVE DIAGNOSIS:  Right midureteral stone.   SURGEON:  Excell Seltzer. Annabell Howells, M.D.   ANESTHESIA:  General.   SPECIMEN:  Stone fragment.   DRAINS:  6-French with 26-cm double-J stent.   COMPLICATIONS:  None.   INDICATIONS:  Link is a 55 year old white male with a symptomatic 8-mm  right midureteral stone which has failed conservative therapies, to  undergo ureteroscopy.   FINDINGS AND PROCEDURE:  The patient was taken to the operating room  where general anesthetic was induced.  He was placed in lithotomy  position.  He had been given Cipro preoperatively.  His perineum and  genitalia were prepped with Betadine solution, and he was draped in the  usual sterile fashion.   Cystoscopy was performed with the 22-French scope and 12-degree lens.  Examination revealed a normal urethra.  The external sphincter was  intact.  The prostatic urethra was approximately 2 cm in length with  bilobar hyperplasia with mild obstruction.  Examination of bladder  revealed mild trabeculation.  No tumor, stones, or inflammation were  noted.  The ureteral orifices were unremarkable.   The right ureteral orifice was cannulated with a guidewire which was  passed to the kidney.  A 12-French dilator was then passed up to the  level of the stone.  The 6-French short ureteroscope was then inserted  to the level of the stone which was readily visualized.  The 0.385 laser  fiber was then  passed to the stone, and the stone was fragmented with  the holmium laser.  A nitinol basket was then used to remove the  majority of the residual stone fragments.  Some small fragments and dust  remained.  Once the ureter had been cleared of any significant  fragments, the cystoscope was reinserted over the wire and a 6-French 26-  cm double-J stent was inserted without difficulty into the kidney under  fluoroscopic guidance.  The wire was removed, leaving a good coil in the  kidney an a good coil in the bladder.  The stone fragments were  evacuated from the bladder.  The cystoscope was removed after draining  the bladder,  leaving the stent string exiting the urethra.  The string was secured to  the patient's penis.  He was taken down from lithotomy position.  His  anesthetic was reversed.  He was moved to the recovery room in stable  condition.  There no complications.      Excell Seltzer. Annabell Howells, M.D.  Electronically Signed     JJW/MEDQ  D:  12/06/2006  T:  12/06/2006  Job:  [id number]

## 2010-11-08 NOTE — Assessment & Plan Note (Signed)
Scott Holden                            CARDIOLOGY OFFICE NOTE   Scott Holden, Scott Holden                         MRN:          [id number]  DATE:09/24/2007                            DOB:          1956/03/25    Scott Holden is a 51-year patient referred by Scott Holden for an abnormal  echocardiogram, dyspnea, history of TIA, and hypercholesterolemia.   In talking to Scott Holden, he has had exertional dyspnea for quite some  time.  It sounds functional, secondary to his obesity and smoking.  He  has not had a cough.  Apparently, there was an issue with an abnormal  chest x-ray.  He had a follow-up CT scan.  I do not have these results,  but the patient says that the spot on his lung was okay.  He does not  have any baseline PFTs.   I do not think his dyspnea is related to his heart.  He had a 2D  echocardiogram, which I reviewed.  I think it is fairly benign.  It does  make mention of moderate diastolic dysfunction.  This probably simply  relates to his being overweight, with stiff heart syndrome.   He does not have significant valvular heart disease, and his ejection  fraction is normal.   In talking to Scott Holden, he may have had a stress test many years ago.   However, his activity levels have been curtailed.  He has a chronic left  hip problem from back in the 1980s which limits his activities.   REVIEW OF SYSTEMS:  Remarkable for longstanding history of TIAs.  He  sees Scott Holden.  The right side of his body tends to go numb and tingly.  He has been having them increasing with frequency in the last month.  He  has been on Aggrenox for this.   His coronary risk factors otherwise include hypertension and  hyperlipidemia.   He is a smoker, smoking about a 1-1/2 packs a day.   His father is a patient of Scott Holden and has had premature coronary  disease.   The patient's past medical history is otherwise remarkable for hip  replacement in the 1980s, with  revision in the 1990s, smoking, history  of TIAs, hyperlipidemia, and hypertension.   The patient is unemployed.  He enjoys music.  He plays and tries to  promote it.  He is fairly sedentary.  He smokes 1-1/2 packs a day.  Denies drug or alcohol intake.  He is currently single but does have  children.   REVIEW OF SYSTEMS:  Otherwise negative.   FAMILY HISTORY:  As indicated.  Both mother and father are alive.  Father has premature coronary disease and is a patient of Scott Holden.   The patient is allergic to MORPHINE.   He is on Aggrenox 25 b.i.d.; Lipitor, question dose; Inderal, question  dose.   He also has p.r.n. Duragesic patch, Percocet for his back pain.   PHYSICAL EXAMINATION:  GENERAL:  Remarkable for an overweight white  male, in no distress.  VITAL SIGNS:  His blood  pressure is 130/80, pulse is 70 and regular,  afebrile, respiratory rate 16.  HEENT:  Unremarkable.  NECK:  Carotids normal, without bruit.  No lymphadenopathy, thyromegaly,  or JVP elevation.  LUNGS:  Clear.  Good diaphragmatic motion.  No wheezing.  HEART:  S1, S2.  Normal heart sounds.  PMI normal.  ABDOMEN:  Obese.  No AAA, no tenderness, no bruit, no hepatosplenomegaly  or hepatojugular reflux.  EXTREMITIES:  Distal pulses are intact, with no edema.  NEUROLOGIC:  Nonfocal.  SKIN:  Warm and dry.  No muscular weakness.   Echocardiogram was reviewed.  As indicated, EF was 55%-60%, with mildly  calcified aortic valve and mild aortic regurg, and moderate diastolic  dysfunction.   His baseline EKG shows sinus rhythm and is normal.   IMPRESSION:  1. Abnormal echocardiogram, simply related to the patient's body      habitus.  Diastolic dysfunction not clinically significant.  No      significant valvular heart disease.  2. History of transient ischemic attacks.  Follow up with Scott Holden.      Suggested taking a baby aspirin with his Aggrenox.  Apparently, the      patient has had extensive workup,  including MRIs and transcranial      Dopplers.  I will leave this up to Scott Holden to follow since it is a      chronic problem.  3. Hyperlipidemia.  The patient will call us with his dose of Lipitor.      Follow up with primary care M.D.  Lipid and liver profile in 6      months.  4. Hypertension.  Continue Inderal.  Again, we need to get the dose      for this.  His resting heart rate appears to be 75.  5. Dyspnea related to obesity and smoking.  Needs to quit.  Follow up      with Scott Holden in terms of his lung problems, including a      question of a lung nodule.   The patient will have am adenosine Myoview study, given his multiple  risk factors.  As long as this is nonischemic, we will see him on an as-  needed basis.    Noralyn Pick. Eden Emms, MD, [facility]  Electronically Signed   PCN/MedQ  DD: 09/24/2007  DT: 09/24/2007  Job #: [id number]

## 2010-11-11 NOTE — Discharge Summary (Signed)
[place]  Patient:    Scott Holden, Scott Holden Visit Number: [id number] MRN: [id number]          Service Type: MED Location: 1800 [id number] [id number] Attending Physician:  Gracelyn Nurse Dictated by:   Gracelyn Nurse, M.D. Admit Date:  11/08/2001 Discharge Date: 11/10/2001   CC:         Caryn Bee L. Little, M.D.   Discharge Summary  DISCHARGE DIAGNOSES: 1. Atypical chest pain.    a. Chest computed tomography negative for pulmonary embolus.    b. Cardiolite showed old inferior scar.  No reversible ischemic disease. 2. Headache. 3. History of hypertension, diet-controlled. 4. Tobacco abuse. 5. Obesity.  DISCHARGE MEDICATIONS: 1. Aspirin 325 mg q.d. 2. Vicodin 1-2 p.o. q.6h. p.r.n. #20.  PROCEDURES: 1. CT scan, which showed no evidence of PE. 2. Treadmill Cardiolite, which showed no ischemic heart disease and showed an    old inferior scar.  HISTORY OF PRESENT ILLNESS:  This is a 55 year old white male who started having left-sided chest pain radiating to the left shoulder, lasted one to two hours.  It is questionable whether he had shortness of breath.  He said he never experienced pain like this before.  PHYSICAL EXAMINATION:  Vital signs:  Pulse 97, respirations 16, blood pressure 124/84, temperature 98, O2 saturation 97% on room air.  HOSPITAL COURSE: #1 - ATYPICAL CHEST PAIN:  The patient was given aspirin, started on beta blocker, given p.r.n. nitroglycerin.  He had no other pain during the hospitalization.  He gave a history of some right leg pain associated with this which brought up the suspicion of PE.  He was given a CT scan of the chest and lower extremities which showed no sign of PE or DVT.  Cardiology was consulted at that time.  He was set up for a Cardiolite, which gave above results.  This chest pain was determined to be noncardiac in nature and, apparently, was musculoskeletal.  He was advised to lose weight and follow up with his  primary M.D. and to take aspirin q.d.  #2 - HEADACHE:  The patient did have symptoms of a tension headache.  He was given ibuprofen and Tylenol and did not get much relief.  We prescribed him a short course of Vicodin.  He is to follow up with his primary care M.D. on this also.  DISCHARGE LABORATORY DATA:  Sodium 139, potassium 3.8, chloride 106, CO2 27, glucose 95, BUN 9, creatinine 1.3.  There is a fasting lipid panel pending.  CONDITION ON DISCHARGE:  The patient is discharged in stable condition.  FOLLOW-UP:  He is to call his primary care doctor for follow-up in two weeks. His fasting lipid profile does need to be followed up on at that time.Dictated by:   Gracelyn Nurse, M.D. Attending Physician:  Marcelino Duster D DD:  11/10/01 TD:  11/13/01 Job: [id number]

## 2010-11-11 NOTE — H&P (Signed)
Scott Holden, Scott Holden NO.:  [id number]   MEDICAL RECORD NO.:  [id number]          PATIENT TYPE:  INP   LOCATION:  0104                         FACILITY:  [facility]   PHYSICIAN:  Velora Heckler, MD      DATE OF BIRTH:  10/05/1955   DATE OF ADMISSION:  05/01/2005  DATE OF DISCHARGE:                                HISTORY & PHYSICAL   REFERRING PHYSICIAN:  Sheppard Penton. Stacie Acres, M.D., emergency department.   CHIEF COMPLAINT:  Abdominal pain, incarcerated hernia.   HISTORY OF PRESENT ILLNESS:  The patient's 55 year old white male with 3-  month history of intermittent pain at the umbilicus. Over the past 2-3 days,  the patient has had significantly increased pain. He notes mild nausea. The  patient presented to the emergency department. Abdominal ultrasound showed  fluid and echogenic material within the hernia. CT scan of the abdomen was  obtained which showed an incarcerated umbilical hernia with stranding and  inflammatory changes likely containing omentum. No evidence of bowel was  present within the hernia. General surgery was consulted.   PAST MEDICAL HISTORY:  1.  Status post right total hip replacement x2.  2.  History of hiatal hernia.  3.  Status post mediastinoscopy.   MEDICATIONS:  Percocet and Neurontin for right hip and leg pain.   ALLERGIES:  MORPHINE, causing hives.   SOCIAL HISTORY:  The patient lives in [place]. He is accompanied by  multiple family members. He denies alcohol use. He denies drug use. He notes  history of tobacco use.   REVIEW OF SYSTEMS:  A 15-system review without significant other positives  except as noted above.   FAMILY HISTORY:  Noncontributory.   PHYSICAL EXAMINATION:  GENERAL:  A 55 year old moderately obese white male  on a stretcher in the emergency department in mild discomfort.  VITAL SIGNS:  Temperature 97.9 pulse 66, respirations 18, blood pressure  113/60.  HEENT: Shows him to be normocephalic, atraumatic.  Sclerae clear. Conjunctiva  clear. Pupils equal and reactive. Dentition fair. Mucous membranes moist.  There is a soft tissue mass on the left anterior forehead.  NECK:  Supple without mass. Thyroid normal without nodularity. There is no  lymphadenopathy. There is a healed surgical wound at suprasternal notch.  LUNGS:  Clear to auscultation bilaterally without rales, rhonchi or wheeze.  CARDIAC:  Exam shows regular rate and rhythm without murmur. Peripheral  pulses are full.  ABDOMEN:  Soft obese with bowel sounds present on auscultation. There is an  obvious hernia at the umbilicus. It has a purple discoloration of the  overlying skin. It measures approximately 3 cm in greatest dimension and is  exclusively tender to palpation.  EXTREMITIES:  Nontender without edema.  NEUROLOGIC:  He is alert and oriented without focal deficit.   LABORATORY STUDIES:  White count 10.3, hemoglobin 16.4 platelet count  274,000. Electrolytes are within normal limits.   EKG results are pending.   RADIOGRAPHIC RESULTS:  Ultrasound of the abdomen as noted above.   CT scan showing incarcerated umbilical hernia likely containing strangulated  omentum.  IMPRESSION:  Incarcerated umbilical hernia, symptomatic, likely containing  omentum, rule out small bowel.   PLAN:  1.  Admission to [facility].  2.  To operating room for umbilical hernia repair.  3.  Overnight observation.      Velora Heckler, MD  Electronically Signed     TMG/MEDQ  D:  05/01/2005  T:  05/01/2005  Job:  [id number]   cc:   Sheppard Penton. Stacie Acres, M.D.  Fax: [phone]

## 2010-11-11 NOTE — H&P (Signed)
Scott Holden, Scott Holden NO.:  [id number]   MEDICAL RECORD NO.:  [id number]          PATIENT TYPE:  INP   LOCATION:  0465                         FACILITY:  [facility]   PHYSICIAN:  Ollen Gross, M.D.    DATE OF BIRTH:  04/14/1956   DATE OF ADMISSION:  05/11/2004  DATE OF DISCHARGE:                                HISTORY & PHYSICAL   Date of office visit, history and physical:  April 28, 2004.   CHIEF COMPLAINT:  Right hip pain.   HISTORY OF PRESENT ILLNESS:  The patient is a 55 year old male who has  previously undergone a right total hip arthroplasty back in 1989.  He did  very well after his initial surgery but has now started developing some pain  in the right hip region. He has noticed that the pain has started holding  him back and he feels like it is starting to grab in the region.  He came in  and was evaluated by Dr. Lequita Halt for his symptoms.  He was found to have on  x-rays significant eccentric polyethylene wear and also a large lytic cyst  behind the acetabular component.  It is felt due to the eccentric  polyethylene wear and the osteolysis that there is some concern with regard  to the findings.  It is felt it would probably be best to go ahead and  proceed with surgical intervention in order to prevent any type of further  catastrophic problem such as a periprosthetic fracture or loosening of his  component.  Risks and benefits of this procedure have been discussed with  the patient at length, and he elects to proceed with surgery.   ALLERGIES:  MORPHINE causes the patient to break out.  HYDROCODONE causes  itching and queasiness (patient is able to take Tylox).   CURRENT MEDICATIONS:  None.   PAST MEDICAL HISTORY:  1.  History of a slight stroke May 2003.  2.  History of headaches.  3.  Hiatal hernia.  4.  Reflux disease.  5.  History of cystitis.  6.  History of renal calculi.   PAST SURGICAL HISTORY:  Right total hip replacement, 1989.   SOCIAL HISTORY:  Divorced.  Works in [employer].  A 1-1/2 to two pack  smoker per day.  No alcohol.  Has three children.   FAMILY HISTORY:  Arthritis is prominent in his family.  He had a grandmother  with a history of stroke, grandfather with history of prostate cancer.   REVIEW OF SYSTEMS:  GENERAL:  No fevers, chills, night sweats.  NEUROLOGIC:  History of slight stroke in May 2003.  Also some headaches.  No seizures,  syncope, or paralysis.  RESPIRATORY:  No shortness of breath, productive  cough, or hemoptysis.  CARDIOVASCULAR:  No chest pain, angina, or orthopnea.  GASTROINTESTINAL:  Hiatal hernia with reflux with occasional flare.  No  symptoms at this time.  No nausea, vomiting, diarrhea, or constipation.  GENITOURINARY:  A little bit of nocturia, otherwise normal dysuria,  hematuria, or discharge.  MUSCULOSKELETAL:  Pertinent to the hip, found in  the history of present illness.   PHYSICAL EXAMINATION:  VITAL SIGNS:  Pulse 72, respirations 16, blood  pressure 148/92.  GENERAL:  A 55 year old white male, well-nourished, well-developed, in no  acute distress.  Alert and oriented and cooperative.  He is overweight.  Good historian.  HEENT:  Normocephalic, atraumatic.  Pupils equal, round, and reactive.  Oropharynx clear.  EOMs are intact.  He does have an upper denture plate  noted.  NECK:  Supple.  CHEST:  Clear, anterior posterior chest walls.  No rhonchi, rales, or  wheezing.  CARDIAC:  Regular rate and rhythm, no murmurs.  ABDOMEN:  Soft, round, protuberant abdomen.  Bowel sounds present.  RECTAL, BREASTS, GENITALIA:  Not done and not pertinent to the present  illness.  EXTREMITIES:  Significant to the right hip.  Motor function is intact  throughout the right lower extremity.  On passive range of motion there is  no significant pain, but he is noted to have pain in the groin and hip  region.  Sensation is intact.   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   PLAN:  The patient will be admitted to [facility] to undergo a  right hip revision with polyethylene exchange and possible bone-grafting.     Alex   ALP/MEDQ  D:  05/11/2004  T:  05/12/2004  Job:  [id number]

## 2010-11-11 NOTE — Op Note (Signed)
NAMELUDWIG, TUGWELL NO.:  [id number]   MEDICAL RECORD NO.:  [id number]          PATIENT TYPE:  AMB   LOCATION:  NESC                         FACILITY:  [facility]   PHYSICIAN:  Maretta Bees. Vonita Moss, M.D.DATE OF BIRTH:  05-20-1956   DATE OF PROCEDURE:  07/11/2006  DATE OF DISCHARGE:                               OPERATIVE REPORT   PREOPERATIVE DIAGNOSIS:  Distal right ureteral stone.   POSTOPERATIVE DIAGNOSIS:  Distal right ureteral stone.   PROCEDURE:  Cystoscopy, right ureteroscopy and stone extraction.   SURGEON:  Dr. Larey Dresser.   ANESTHESIA:  General.   INDICATIONS:  This gentleman has had several days of right flank pain  and bladder irritability from a 5 mm stone in the distal right ureter.  He requested removal today because of persistent symptoms.  He was  advised about the risk of bleeding ureteral injury or double-J catheter.   PROCEDURE:  The patient brought to the operating room, placed lithotomy  position.  External genitalia were prepped and draped in usual fashion.  He was cystoscoped and the bladder was unremarkable except for a yellow  stone at the right ureteral orifice with some surrounding erythema.  I  then inserted  the ultra short 6-French ureteroscope and with the tip of  that I manipulated the stone out of the ureteral orifice and then  performed ureteroscopy.  There were no residual stone fragments in the  distal ureter.  I then reinserted cystoscope, removed the stone, and  gave it to the patient's family.  Bladder was emptied, scope removed.  The patient sent to recovery room in good condition having tolerated the  procedure well.      Maretta Bees. Vonita Moss, M.D.  Electronically Signed     LJP/MEDQ  D:  07/11/2006  T:  07/11/2006  Job:  [id number]   cc:   Genene Churn. Love, M.D.  Fax: [phone]

## 2010-11-11 NOTE — Op Note (Signed)
NAMEDARYAN, BUELL NO.:  [id number]   MEDICAL RECORD NO.:  [id number]          PATIENT TYPE:  OBV   LOCATION:  0445                         FACILITY:  [facility]   PHYSICIAN:  Erasmo Leventhal, M.D.DATE OF BIRTH:  [birth year]   DATE OF PROCEDURE:  DATE OF DISCHARGE:                                 OPERATIVE REPORT   Scott Holden is a 55 year old male status post right revision total hip  arthroplasty by Ollen Gross, M.D. on May 11, 2004.  He has been  having increasing right groin pain, he called the office and was suppose to  go in and see Ollen Gross, M.D. in our office today. At approximately 1  p.m., he was taking a bath in preparation for his office visit, turned and  dislocated his right total hip replacement.  He was brought to the emergency  room by EMS, evaluated by Dr. Jennette Kettle and I was consulted.  Dr. Lequita Halt asked  me if I would proceed with the closed reduction and in fact he was tied up.  I evaluated the patient in the emergency room, he was found to have a  superior dislocation of the right total hip arthroplasty.  I discussed the  risks and benefits of surgery including the possibility of damage to natural  bone, soft tissue and implant, he understood and wished to proceed.   PREOPERATIVE DIAGNOSES:  Dislocated right total hip arthroplasty.   POSTOPERATIVE DIAGNOSES:  Dislocated right total hip arthroplasty.   PROCEDURE:  Closed reduction of dislocated right total hip arthroplasty with  stress radiography.   SURGEON:  Erasmo Leventhal, M.D.   ANESTHESIA:  General, Jill Side, M.D.   ESTIMATED BLOOD LOSS:  None.   COMPLICATIONS:  None.   DISPOSITION:  To PACU stable.   DESCRIPTION OF PROCEDURE:  The patient and family had been counseled in the  holding area and in the emergency room.  He was taken to the OR, placed  supine under general anesthesia.  He was then placed on the OR table. The  right lower extremity was  shortened noticeably. After general anesthesia, he  underwent a gentle closed reduction by longitudinal traction, gentle  flexion, given a palpable gentle soft reduction.  There were no  complications or problems. C-arm was utilized to confirm anatomic reduction  of the dislocation and no complicating factors or problems.   At this time __________ C-arm, he could flex to 90 degrees, externally  rotate to 30-40 degrees, internally rotate 20-30 degrees.  He was placed on  an abduction pillow at that point in time and then awakened. He was taken  from the operating room to PACU in stable condition. There were no  complications or problems.      RAC/MEDQ  D:  06/02/2004  T:  06/03/2004  Job:  [id number]

## 2010-11-11 NOTE — Op Note (Signed)
NAMERAMEY, SCHIFF NO.:  [id number]   MEDICAL RECORD NO.:  [id number]          PATIENT TYPE:  INP   LOCATION:  0104                         FACILITY:  [facility]   PHYSICIAN:  Velora Heckler, MD      DATE OF BIRTH:  1955-11-07   DATE OF PROCEDURE:  05/01/2005  DATE OF DISCHARGE:                                 OPERATIVE REPORT   PREOPERATIVE DIAGNOSIS:  Incarcerated umbilical hernia.   POSTOPERATIVE DIAGNOSIS:  Incarcerated umbilical hernia.   PROCEDURE:  Repair incarcerated umbilical hernia with mesh (Prolene hernia  system).   SURGEON:  Velora Heckler, MD.   ANESTHESIA:  General.   ESTIMATED BLOOD LOSS:  Minimal.   PREPARATION:  Betadine.   COMPLICATIONS:  None.   INDICATIONS:  The patient is 55 year old white male who presents to the  emergency department with a 51-month history of umbilical hernia. Over the  past 2 days, he had noted progressive pain, swelling and nausea. Evaluation  by the emergency room physician included an abdominal ultrasound and CT scan  of the abdomen. This demonstrated findings of incarcerated umbilical hernia  with stranding and inflammatory changes. General surgery was consulted and  the patient was prepared for the operating room.   DESCRIPTION OF PROCEDURE:  The procedure was done in OR #1 at the [facility]. The patient is brought to the operating room,  placed in a supine position on the operating room table. Following the  administration of general anesthesia, the patient is prepped and draped in  the usual strict aseptic fashion. After ascertaining that an adequate level  of anesthesia had been obtained, an infraumbilical incision was made with a  #15 blade. Dissection was carried through the subcutaneous tissues to the  fascia. The fascial plane is dissected out. The umbilical hernia sac is  opened. Incarcerated omentum is reduced back within the peritoneal cavity.  The fascial defect is delineated.  Good hemostasis is noted. The fascial  plane is developed for 3 cm circumferentially. Using an index finger through  the hernia defect, a preperitoneal plane is developed. The hernia defect is  snug about the index finger. The defect measures approximately 1.5 cm in  diameter.   The large Prolene hernia system is trimmed slightly to accommodate the  wound. The inner portion of the mesh was inserted through the hernia defect  and spread nicely in the preperitoneal plane. The anterior onlay  covers the  hernia defect and surrounding fascia nicely. Four sutures were placed  through the mesh into the edges of the hernia defect. This was performed  with #1 Ethibond suture. A second concentric ring of four sutures was placed  at the edge of the onlay mesh circumferentially. Good hemostasis was noted.  The wound was irrigated with saline which was evacuated. The subcutaneous  tissues were closed with interrupted 3-0 Vicryl sutures. The skin was  anesthetized with  local Marcaine anesthetic. The skin is closed with running 4-0 Vicryl  subcuticular suture. The wound is washed and dried and Benzoin, and Steri-  Strips are applied.  Sterile dressings are applied. The patient is awakened  from anesthesia and brought to the recovery room in stable condition. The  patient tolerated the procedure well.      Velora Heckler, MD  Electronically Signed     TMG/MEDQ  D:  05/01/2005  T:  05/02/2005  Job:  [id number]

## 2010-11-11 NOTE — Op Note (Signed)
NAMEKALIM, KISSEL NO.:  [id number]   MEDICAL RECORD NO.:  [id number]          PATIENT TYPE:  INP   LOCATION:  0006                         FACILITY:  [facility]   PHYSICIAN:  Ollen Gross, M.D.    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   PROCEDURE:  Right acetabular revision with bone grafting.   SURGEON:  Ollen Gross, M.D.   ASSISTANT:  Alexzandrew L. Julien Girt, P.A.   ANESTHESIA:  General.   ESTIMATED BLOOD LOSS:  300.   DRAINS:  Hemovac x1.   COMPLICATIONS:  None.   CONDITION:  Stable to recovery.   BRIEF CLINICAL NOTE:  Heyward is a 55 year old male who had a right total hip  arthroplasty done approximately 16 years ago.  He did very well.  He has  been asymptomatic but x-rays show significant eccentric wear of his  polyethylene liner with a massive lytic cyst retroacetabular.  He is at risk  of periprosthetic fracture from this.  He presents now for liner revision  with bone grafting to prevent periprosthetic loosening of fracture.   DESCRIPTION OF PROCEDURE:  After successful administration of general  anesthetic, the patient was placed in the left lateral decubitus position  with the right side up and held with a hip positioner. The right lower  extremity was isolated from his perineum with plastic drapes and prepped and  draped in the usual sterile fashion. A standard posterolateral incision was  made with a 10 blade through the subcutaneous tissue to the level of the  fascia lata which was incised in line with a skin incision.  The sciatic  nerve was palpated and protected and the posterior pseudocapsule incised. A  large amount of lytic debris is noted in  the joint with fluid present. It  was consistent with osteolytic fluid and debris. We excised the  pseudocapsule posterior and removed all the lytic debris. The hip was then  dislocated and the femoral head removed. The AML femoral stem is very stable  and is in excellent position.  We then removed some of the anterior capsule  and retracted the femur anteriorly to gain acetabular exposure.  There was  significant wear of the polyethylene liner.  It was an SROM cup and we had  to remove the three peripheral screws in order to get the liner out. The  liner is extracted.  It was a 32 mm 20 degree liner.  Through the central  hole in the cup, we curetted out the lytic debris and then packed the area  with 5 mL of cancellous bone graft using the symphony graft system.  We then  placed a new 32 mm 10 degree liner with the 10 degree elevated rim  superiorly. We replaced two of the peripheral screws back in the previous  screw holes  to lock the liner in place.  The trial 32 plus 5 head is placed  and the hip is reduced with excellent stability, full extension, flexion and  rotation, 70 degrees of flexion, 40 degrees adduction and 90 internal  rotation and 90 degrees flexion, 70 degrees internal rotation.  The  permanent 32 plus 5 head is then placed with the same stability parameters.  The wound is copiously irrigated with saline solution and the posterior  tissue reattached to the femur with Ethibond suture. The fascia lata is  closed over a Hemovac drain with interrupted #1 Vicryl, subcu closed with #1  and 2-0 Vicryl and skin with staples.  Drain is hooked to suction. Incision  cleaned and dried and a bulky sterile dressing applied. He is placed into a  knee immobilizer, awakened and transported to recovery in stable condition.     Drenda Freeze   FA/MEDQ  D:  05/11/2004  T:  05/12/2004  Job:  [id number]

## 2010-11-11 NOTE — Consult Note (Signed)
[place]  Patient:    Scott Holden, Scott Holden Visit Number: [id number] MRN: [id number]          Service Type: MED Location: [id number] Attending Physician:  Marcelino Duster Md Dictated by:   Clovis Pu Patty Sermons, M.D. Proc. Date: 11/09/01 Admit Date:  11/08/2001   CC:         Gracelyn Nurse, M.D.   Consultation Report  REASON FOR CONSULTATION: Chest pain.  HISTORY OF PRESENT ILLNESS: This is a 55 year old divorced Caucasian gentleman, with no prior history of known heart problems, who is admitted with sudden onset of pressure-like discomfort in his chest and left shoulder and down the left arm while driving his car yesterday.  He has had intense nausea but no vomiting, and he had diaphoresis.  He was dyspneic.  There was a question of a pleuritic component to his pain.  FAMILY HISTORY: His parents are in good health but several of his paternal uncles have died with heart attacks.  SOCIAL HISTORY: He is divorced.  He has a nine-year-old son, who lives with him.  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  He smoked two packs of cigarettes a day but does not drink any alcohol.  He does not get any regular aerobic exercise other than for his work.  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  ALLERGIES: He is allergic to MORPHINE, which causes hives.  MEDICATIONS: He is on no regular medicines at home.  REVIEW OF SYSTEMS: He hasno knowledge of his cholesterol status.  Years ago he was treated for high blood pressure but the medicine was stopped after he lost weight and his blood pressure came down.  He does not have any history of diabetes.  The remainder of the Review Of Systems is negative in detail.  PHYSICAL EXAMINATION:  VITAL SIGNS: His blood pressure is 132/80, pulse 82 and regular, respirations are  normal.  GENERAL: This isa large Caucasian male in no distress.  HEENT/NECK: No carotid bruits.  Jugular venous pressure normal.  CHEST: Clear.  HEART: S1normal, S2 normal.  No murmur, gallop, rub, or click.  ABDOMEN: Large, nontender.  Liver and spleen not enlarged.  There is no ascites.  EXTREMITIES: No phlebitis or edema.  Pulses are good.  LABORATORY DATA: His electrocardiogram on admission shows normal sinus rhythm andis within normal limits, with no ischemic changes.  Follow-up EKG is also normal.  Laboratory studies so far are unremarkable.  IMPRESSION:  1. Chest pain, rule out myocardial infarction.  2. Multiple risk factors for premature coronary artery disease including     smoking, positive family history, and sedentary life-style.  His lipid     status isunknown.  RECOMMENDATIONS: He appears to be ruling out for an acuteMI, but we should risk stratify him and will attempt to get him scheduled for a treadmill Cardiolite in the a.m. Dictated by:   Clovis Pu Patty Sermons, M.D. Attending Physician:  Marcelino Duster Md DD:  11/09/01 TD:  11/09/01 Job: [id number]

## 2010-11-30 ENCOUNTER — Encounter: Payer: Medicaid Other | Attending: Neurosurgery | Admitting: Neurosurgery

## 2010-11-30 DIAGNOSIS — R42 Dizziness and giddiness: Secondary | ICD-10-CM | POA: Insufficient documentation

## 2010-11-30 DIAGNOSIS — M961 Postlaminectomy syndrome, not elsewhere classified: Secondary | ICD-10-CM

## 2010-11-30 DIAGNOSIS — M545 Low back pain, unspecified: Secondary | ICD-10-CM

## 2010-11-30 DIAGNOSIS — F17200 Nicotine dependence, unspecified, uncomplicated: Secondary | ICD-10-CM | POA: Insufficient documentation

## 2010-11-30 DIAGNOSIS — M25559 Pain in unspecified hip: Secondary | ICD-10-CM

## 2010-11-30 DIAGNOSIS — Z8673 Personal history of transient ischemic attack (TIA), and cerebral infarction without residual deficits: Secondary | ICD-10-CM | POA: Insufficient documentation

## 2010-11-30 DIAGNOSIS — R209 Unspecified disturbances of skin sensation: Secondary | ICD-10-CM | POA: Insufficient documentation

## 2010-11-30 DIAGNOSIS — I10 Essential (primary) hypertension: Secondary | ICD-10-CM | POA: Insufficient documentation

## 2010-11-30 DIAGNOSIS — Z96649 Presence of unspecified artificial hip joint: Secondary | ICD-10-CM | POA: Insufficient documentation

## 2010-12-01 NOTE — Assessment & Plan Note (Signed)
HISTORY OF PRESENT ILLNESS:  Scott Holden follows up here for his bilateral back pain with right hip pain.  He is status post hip arthroplasty and revision.  He has seen Dr. Ethelene Hal for his back in the past.  Right now, he rates his pain at about 6-7, that is sharp, dull with paresthesia- type pain.  Pain is worse in the morning and night.  Mostly all activities aggravate.  Therapy, medication, and injections tend to help. He walks without assistance.  He does use cane at times.  He climbs steps and drives.  REVIEW OF SYSTEMS:  Notable for those difficulties, worse with some lower extremity swelling from time to time.  PAST MEDICAL HISTORY:  Unchanged.  SOCIAL HISTORY:  He is divorced.  Son lives with him.  FAMILY HISTORY:  Unchanged.  PHYSICAL EXAMINATION:  VITAL SIGNS:  His blood pressure 149/72, pulse 77, respirations are 18, and O2 sat is 94. NEUROLOGIC:  There are no signs of aberrant behavior.  Constitutionally, he is obese.  He is alert and oriented.  His affect is bright and alert. Gait is normal.  Motor strength is good.  Sensation is good.  ASSESSMENT: 1. Chronic low back pain. 2. Status post right hip revision with chronic pain.  PLAN:  I went ahead and refilled his oxycodone 50 mg 1 p.o. q.i.d. 120, no refills and fentanyl transdermal 50 mcg 1 transdermally every 48 hours 15 with no refill.  His questions were encouraged and answered. We will see him back here in my clinic in a month.     Brayden Brodhead L. Blima Dessert Electronically Signed    RLW/MedQ D:  11/30/2010 14:20:22  T:  12/01/2010 05:30:02  Job #:  [id number]

## 2010-12-27 ENCOUNTER — Encounter: Payer: Medicaid Other | Attending: Neurosurgery | Admitting: Neurosurgery

## 2010-12-27 DIAGNOSIS — M545 Low back pain, unspecified: Secondary | ICD-10-CM | POA: Insufficient documentation

## 2010-12-27 DIAGNOSIS — G8929 Other chronic pain: Secondary | ICD-10-CM | POA: Insufficient documentation

## 2010-12-27 DIAGNOSIS — G894 Chronic pain syndrome: Secondary | ICD-10-CM

## 2010-12-27 DIAGNOSIS — M76899 Other specified enthesopathies of unspecified lower limb, excluding foot: Secondary | ICD-10-CM

## 2010-12-27 DIAGNOSIS — M25559 Pain in unspecified hip: Secondary | ICD-10-CM | POA: Insufficient documentation

## 2010-12-28 NOTE — Assessment & Plan Note (Signed)
Scott Holden is followed up here today for his bilateral low back pain and right hip pain.  He states he does have some left-sided greater trochanter pain developing and he may warrant injection next month.  He was told to remind me if we decided to do that and will take care within.  He rates his pain at about 6.  It is basically unchanged.  Most activities aggravate.  Medication and injections tend to help.  REVIEW OF SYSTEMS:  Notable for those difficulties described above as well as some lower extremity edema from time to time, otherwise unremarkable.  PAST MEDICAL HISTORY:  Unchanged.  SOCIAL HISTORY:  He is divorced, lives with his son.  FAMILY HISTORY:  Unchanged.  PHYSICAL EXAMINATION:  VITAL SIGNS:  His blood pressure is 157/88, pulse 84, respirations 20 and O2 sats 96 on room air. EXTREMITIES:  His motor strength is 5/5 including iliopsoas and quadriceps bilaterally.  Sensation is intact. NEUROLOGIC:  Constitutionally, he is somewhat obese.  He is alert and oriented x3, very pleasant does walk with a mild limp.  ASSESSMENT: 1. Chronic low back pain. 2. Status post hip revision with chronic pain.  PLAN:  I refilled his oxycodone 50 mg 1 p.o. q.i.d. 120 with no refills, fentanyl transdermal 50 mcg 1 every 48 h. 15 with no refill.  His questions were encouraged and answered.  I will see him back in a month for possible injection and his greater trochanter on the left.     Scott Holden Electronically Signed    RLW/MedQ D:  12/27/2010 14:44:45  T:  12/28/2010 05:40:54  Job #:  [id number]

## 2011-01-26 ENCOUNTER — Encounter: Payer: Medicaid Other | Attending: Neurosurgery | Admitting: Neurosurgery

## 2011-01-26 DIAGNOSIS — Z8673 Personal history of transient ischemic attack (TIA), and cerebral infarction without residual deficits: Secondary | ICD-10-CM | POA: Insufficient documentation

## 2011-01-26 DIAGNOSIS — Z96649 Presence of unspecified artificial hip joint: Secondary | ICD-10-CM | POA: Insufficient documentation

## 2011-01-26 DIAGNOSIS — M545 Low back pain, unspecified: Secondary | ICD-10-CM | POA: Insufficient documentation

## 2011-01-26 DIAGNOSIS — M25559 Pain in unspecified hip: Secondary | ICD-10-CM

## 2011-01-26 DIAGNOSIS — R209 Unspecified disturbances of skin sensation: Secondary | ICD-10-CM | POA: Insufficient documentation

## 2011-01-26 DIAGNOSIS — F17200 Nicotine dependence, unspecified, uncomplicated: Secondary | ICD-10-CM | POA: Insufficient documentation

## 2011-01-26 DIAGNOSIS — I10 Essential (primary) hypertension: Secondary | ICD-10-CM | POA: Insufficient documentation

## 2011-01-26 DIAGNOSIS — R42 Dizziness and giddiness: Secondary | ICD-10-CM | POA: Insufficient documentation

## 2011-01-27 NOTE — Assessment & Plan Note (Signed)
Account [id number].  HISTORY OF PRESENT ILLNESS:  Scott Holden is patient, I have been following for sometime that is a patient of Dr. Wynn Banker.  He has got some back pain that radiates into his right leg.  He is status post right hip arthroplasty and revision.  He has got a chronic pain.  Today, he reports no changes.  He has been a little better lately with his pain. He averages about 5.  It is a sharp burning pain with stabbing sensation.  General activity level is 2 to 5.  Pain is worse in the morning and night.  Sleep patterns are fair.  The pain is worse with walking, bending, and standing.  Rest, medication, and injections help. He walks without assistance.  He does climb steps and drive. Functionality, he is on disability, but he is quite active and is independent in his private life.  REVIEW OF SYSTEMS:  Notable for the difficulties described above as well as some limb swelling, numbness, and tingling and weakness.  No suicidal thoughts or aberrant behaviors.  His Oswestry score is 48.  PAST MEDICAL HISTORY:  Unchanged.  SOCIAL HISTORY:  He is single, divorced, lives with his son.  FAMILY HISTORY:  Unchanged.  PHYSICAL EXAMINATION:  VITAL SIGNS:  His blood pressure 163is /95, pulse 94, respirations 16, O2 sats 96 on room air.  His motor strength is 5/5 in lower extremities, iliopsoas, quadriceps. Sensation is intact.  He is constitutionally obese.  He is alert and oriented x3, very pleasant.  He does have a slight gait disturbance.  ASSESSMENT: 1. Chronic low back pain. 2. Chronic postop pain status post right hip arthroplasty with     revision.  PLAN: 1. Refill his fentanyl 50 mcg one transdermally every 48 hours, 15     with no refill. 2. Oxycodone 15 mg one p.o. q.i.d. #20 with no refill. 3. He will follow up in the clinic in 1 month.  His questions were     encouraged and answered.     Scott Holden Electronically Signed    RLW/MedQ D:  01/26/2011  14:46:12  T:  01/27/2011 16:10:96  Job #:  [id number]

## 2011-02-28 ENCOUNTER — Ambulatory Visit (HOSPITAL_BASED_OUTPATIENT_CLINIC_OR_DEPARTMENT_OTHER): Payer: Medicaid Other | Admitting: Physical Medicine & Rehabilitation

## 2011-02-28 ENCOUNTER — Encounter: Payer: Medicaid Other | Attending: Neurosurgery

## 2011-02-28 DIAGNOSIS — M545 Low back pain, unspecified: Secondary | ICD-10-CM | POA: Insufficient documentation

## 2011-02-28 DIAGNOSIS — Z8673 Personal history of transient ischemic attack (TIA), and cerebral infarction without residual deficits: Secondary | ICD-10-CM | POA: Insufficient documentation

## 2011-02-28 DIAGNOSIS — M5137 Other intervertebral disc degeneration, lumbosacral region: Secondary | ICD-10-CM

## 2011-02-28 DIAGNOSIS — F17200 Nicotine dependence, unspecified, uncomplicated: Secondary | ICD-10-CM | POA: Insufficient documentation

## 2011-02-28 DIAGNOSIS — M25559 Pain in unspecified hip: Secondary | ICD-10-CM | POA: Insufficient documentation

## 2011-02-28 DIAGNOSIS — R42 Dizziness and giddiness: Secondary | ICD-10-CM | POA: Insufficient documentation

## 2011-02-28 DIAGNOSIS — G894 Chronic pain syndrome: Secondary | ICD-10-CM

## 2011-02-28 DIAGNOSIS — Z96649 Presence of unspecified artificial hip joint: Secondary | ICD-10-CM | POA: Insufficient documentation

## 2011-02-28 DIAGNOSIS — I10 Essential (primary) hypertension: Secondary | ICD-10-CM | POA: Insufficient documentation

## 2011-02-28 DIAGNOSIS — R209 Unspecified disturbances of skin sensation: Secondary | ICD-10-CM | POA: Insufficient documentation

## 2011-02-28 NOTE — Assessment & Plan Note (Signed)
Scott FOR VISIT:  Back pain, hip pain.  HISTORY:  Cy follows up today.  He has had back pain with hip pain. He has seen Dr. Ethelene Hal in the past.  He seen me last time in March of this year.  [facility].  He has been receiving fentanyl patch 50 mcg q.48 h. along with oxycodone 15 mg q.i.d.  He had a urine drug screen on August Holden, Scott Holden demonstrating not only the oxycodone Holden fentanyl, but showing evidence of hydrocodone Holden methadone.  I questioned him about this.  He states he does not know where the methadone guidance system.  He states he had some hydrocodone at home from a previous prescription that he took.  REVIEW OF SYSTEMS:  Pain level is listed as a 6/10, numbness, tremor, tingling, trouble walking, Holden swelling.  SOCIAL HISTORY:  Divorced, lives with his son, smokes pack per day.  PHYSICAL EXAMINATION:  VITAL SIGNS:  Blood pressure 165/89, pulse 104, respirations 18, Holden O2 sat 97% on room air. MUSCULOSKELETAL:  Straight leg raising test is negative.  Hip internal Holden external rotation without pain.  Lower extremity strength is normal. Gait is wide based but this is mainly due to his size.  IMPRESSION:  Chronic back lumbosacral disk disorder as well as chronic pain syndrome, chronic hip pain, post hip replacement in revision. Unfortunately, he has a urine drug screen indicating multiple providers. I discussed this with him, he denies this.  I asked him whether he is having problems with controlling his pain medications Holden whether he has an addiction issue in the past, he says no.  I discussed that we need to discharge him Holden I have given him a list of other pain medicine providers Holden also given him a pamphlet Holden brochure on the [facility], he wish to pursue addiction counseling.  I will not see him back in my office.     Erick Colace, M.D. Electronically Signed    AEK/MedQ D:  09/04/Scott 16:20:31  T:  09/04/Scott 22:13:45   Job #:  [id number]  cc:   Caralyn Guile. Ethelene Hal, M.D. Fax: [phone]

## 2011-03-28 LAB — CBC
Hemoglobin: 17
RBC: 5.66
WBC: 13.9 — ABNORMAL HIGH

## 2011-03-28 LAB — URINALYSIS, ROUTINE W REFLEX MICROSCOPIC
Bilirubin Urine: NEGATIVE
Hgb urine dipstick: NEGATIVE
Nitrite: NEGATIVE
Specific Gravity, Urine: 1.013
pH: 6

## 2011-03-28 LAB — BASIC METABOLIC PANEL
Calcium: 9.4
GFR calc Af Amer: >60
GFR calc non Af Amer: >60
Sodium: 141

## 2011-03-28 LAB — DIFFERENTIAL
Lymphocytes Relative: 26
Monocytes Absolute: 0.6
Monocytes Relative: 4
Neutro Abs: 9.4 — ABNORMAL HIGH

## 2011-03-31 ENCOUNTER — Ambulatory Visit (HOSPITAL_COMMUNITY)
Admission: RE | Admit: 2011-03-31 | Discharge: 2011-03-31 | Disposition: A | Discharge status: Home / Self Care | Payer: Medicaid Other | Source: Ambulatory Visit | Attending: Orthopaedic Surgery | Admitting: Orthopaedic Surgery

## 2011-03-31 DIAGNOSIS — X58XXXA Exposure to other specified factors, initial encounter: Secondary | ICD-10-CM | POA: Insufficient documentation

## 2011-03-31 DIAGNOSIS — M129 Arthropathy, unspecified: Secondary | ICD-10-CM | POA: Insufficient documentation

## 2011-03-31 DIAGNOSIS — S83289A Other tear of lateral meniscus, current injury, unspecified knee, initial encounter: Secondary | ICD-10-CM | POA: Insufficient documentation

## 2011-03-31 DIAGNOSIS — K219 Gastro-esophageal reflux disease without esophagitis: Secondary | ICD-10-CM | POA: Insufficient documentation

## 2011-03-31 DIAGNOSIS — M2240 Chondromalacia patellae, unspecified knee: Secondary | ICD-10-CM | POA: Insufficient documentation

## 2011-03-31 DIAGNOSIS — F17200 Nicotine dependence, unspecified, uncomplicated: Secondary | ICD-10-CM | POA: Insufficient documentation

## 2011-03-31 DIAGNOSIS — Z8673 Personal history of transient ischemic attack (TIA), and cerebral infarction without residual deficits: Secondary | ICD-10-CM | POA: Insufficient documentation

## 2011-03-31 DIAGNOSIS — Z79899 Other long term (current) drug therapy: Secondary | ICD-10-CM | POA: Insufficient documentation

## 2011-03-31 DIAGNOSIS — Z96649 Presence of unspecified artificial hip joint: Secondary | ICD-10-CM | POA: Insufficient documentation

## 2011-03-31 LAB — URINALYSIS, ROUTINE W REFLEX MICROSCOPIC
Bilirubin Urine: NEGATIVE
Glucose, UA: NEGATIVE mg/dL
Glucose, UA: NEGATIVE mg/dL
Hgb urine dipstick: NEGATIVE
Ketones, ur: NEGATIVE mg/dL
Specific Gravity, Urine: 1.035 — ABNORMAL HIGH (ref 1.005–1.030)
Specific Gravity, Urine: 1.019 (ref 1.005–1.030)
pH: 6 (ref 5.0–8.0)
pH: 6.5 (ref 5.0–8.0)

## 2011-03-31 LAB — POCT I-STAT, CHEM 8
BUN: 10 mg/dL (ref 6–23)
Creatinine, Ser: 1.1 mg/dL (ref 0.4–1.5)
Hemoglobin: 18.4 g/dL — ABNORMAL HIGH (ref 13.0–17.0)
Potassium: 3.8 mEq/L (ref 3.5–5.1)
Sodium: 141 mEq/L (ref 135–145)
TCO2: 27 mmol/L (ref 0–100)

## 2011-03-31 LAB — URINE MICROSCOPIC-ADD ON

## 2011-03-31 LAB — BASIC METABOLIC PANEL
BUN: 8 mg/dL (ref 6–23)
CO2: 31 mEq/L (ref 19–32)
CO2: 28 mEq/L (ref 19–32)
Calcium: 9.5 mg/dL (ref 8.4–10.5)
Calcium: 9 mg/dL (ref 8.4–10.5)
Chloride: 104 mEq/L (ref 96–112)
Creatinine, Ser: 0.91 mg/dL (ref 0.50–1.35)
GFR calc Af Amer: >60 mL/min (ref >60)
GFR calc non Af Amer: >60 mL/min (ref >60)
Glucose, Bld: 85 mg/dL (ref 70–99)
Potassium: 3.9 mEq/L (ref 3.5–5.1)
Sodium: 144 mEq/L (ref 135–145)

## 2011-03-31 LAB — CBC
HCT: 51.1 % (ref 39.0–52.0)
HCT: 49.3 % (ref 39.0–52.0)
Hemoglobin: 15.7 g/dL (ref 13.0–17.0)
Hemoglobin: 16.8 g/dL (ref 13.0–17.0)
Hemoglobin: 16.4 g/dL (ref 13.0–17.0)
MCH: 29.9 pg (ref 26.0–34.0)
MCV: 87.4 fL (ref 78.0–100.0)
RBC: 5.25 MIL/uL (ref 4.22–5.81)
RBC: 5.52 MIL/uL (ref 4.22–5.81)
RBC: 5.48 MIL/uL (ref 4.22–5.81)
RDW: 14.4 % (ref 11.5–15.5)
WBC: 11.5 10*3/uL — ABNORMAL HIGH (ref 4.0–10.5)
WBC: 12.2 10*3/uL — ABNORMAL HIGH (ref 4.0–10.5)

## 2011-03-31 LAB — SURGICAL PCR SCREEN: MRSA, PCR: NEGATIVE

## 2011-03-31 LAB — RAPID URINE DRUG SCREEN, HOSP PERFORMED
Amphetamines: NOT DETECTED
Benzodiazepines: NOT DETECTED
Opiates: POSITIVE — AB
Tetrahydrocannabinol: NOT DETECTED

## 2011-03-31 LAB — DIFFERENTIAL
Basophils Absolute: 0 10*3/uL (ref 0.0–0.1)
Eosinophils Relative: 1 % (ref 0–5)
Eosinophils Relative: 2 % (ref 0–5)
Lymphocytes Relative: 25 % (ref 12–46)
Lymphocytes Relative: 32 % (ref 12–46)
Lymphs Abs: 3.1 10*3/uL (ref 0.7–4.0)
Monocytes Absolute: 0.8 10*3/uL (ref 0.1–1.0)
Monocytes Relative: 5 % (ref 3–12)
Neutro Abs: 9.7 10*3/uL — ABNORMAL HIGH (ref 1.7–7.7)
Neutrophils Relative %: 69 % (ref 43–77)

## 2011-03-31 LAB — PROTIME-INR: INR: 0.91 (ref 0.00–1.49)

## 2011-03-31 NOTE — H&P (Addendum)
  NAMECYNTHIA, Scott Holden NO.:  [id number]  MEDICAL RECORD NO.:  [id number]  LOCATION:                               FACILITY:  [facility]  PHYSICIAN:  Vanita Panda. Magnus Ivan, M.D.DATE OF BIRTH:  12-13-1955  DATE OF ADMISSION:  03/31/2011 DATE OF DISCHARGE:                             HISTORY & PHYSICAL   CHIEF COMPLAINT:  Severe right knee pain.  HISTORY OF PRESENT ILLNESS:  Scott Holden is a 55 year old gentleman with intermittent knee pain for about 3 weeks now, has been getting worse. He has had a previous arthroscopic intervention years ago on his right knee.  He has had a new injury and felt something pop in his knee.  He was sent from [facility] to our [facility].  An MRI was obtained, it did show a knee tear of the lateral meniscus near the anterior horn and body with lateral compartment, degenerative changes. Due to this continued pain, he wished to proceed with an arthroscopic intervention at this point.  He understands the risks and benefits of this as well.  PAST MEDICAL HISTORY: 1. Previous TIAs. 2. Migraines. 3. High blood pressure. 4. Acid reflux. 5. Arthritis.  MEDICATIONS: 1. Aggrenox. 2. Loratadine. 3. Lisinopril. 4. Nexium.  ALLERGIES:  MORPHINE drip postoperative, just caused a rash.  PREVIOUS SURGERIES: 1. Total hip replacement in 1990. 2. Revision of right total hip replacement in 2005.  FAMILY MEDICAL HISTORY:  Diabetes and high blood pressure.  SOCIAL HISTORY:  He is divorced.  He does smoke half pack a day for the last 25 years.  He is on disability.  REVIEW OF SYSTEMS:  Negative for chest pain, shortness of breath, fevers, chills, nausea, and vomiting.  Negative for recent TIAs.  PHYSICAL EXAMINATION:  VITAL SIGNS:  He is afebrile, stable vital signs with his vitals can be seen on this record. HEENT:  Normocephalic, atraumatic.  Pupils equal, round, reactive to light.  Extraocular muscles are intact. NECK:   Supple. LUNGS:  Clear to auscultation bilaterally. HEART:  Regular rate and rhythm. ABDOMEN:  Benign. EXTREMITIES:  Left knee shows lateral joint line tenderness with positive McMurray sign as well.  The MRI was reviewed and this showed interval progression of degenerative tearing of the anterior horn of the lateral meniscus and to the mid body with degenerative changes in the lateral compartment as well.  ASSESSMENT:  This is a 55 year old gentleman with acute right knee lateral meniscal tear.  PLAN:  We will proceed with an operative intervention today, which will include arthroscopy with debridement, partial lateral meniscectomy.  He understands the risks and benefits of this and is ready to proceed with surgery.     Vanita Panda. Magnus Ivan, M.D.     CYB/MEDQ  D:  03/31/2011  T:  03/31/2011  Job:  [id number]  Electronically Signed by Doneen Poisson M.D. on 04/11/2011 09:10:31 PM

## 2011-04-11 NOTE — Op Note (Signed)
  NAMEBRADSHAW, MINIHAN NO.:  [id number]  MEDICAL RECORD NO.:  [id number]  LOCATION:                               FACILITY:  [facility]  PHYSICIAN:  Vanita Panda. Magnus Ivan, M.D.DATE OF BIRTH:  1955-09-19  DATE OF PROCEDURE:  03/31/2011 DATE OF DISCHARGE:                              OPERATIVE REPORT   PREOPERATIVE DIAGNOSIS:  Right knee acute lateral meniscal tear.  POSTOPERATIVE DIAGNOSIS:  Right knee acute lateral meniscal tear.  PROCEDURE:  Right knee arthroscopy with partial lateral meniscectomy.  SURGEON:  Vanita Panda. Magnus Ivan, M.D.  ANESTHESIA: 1. General. 2. Local right knee block with 0.25% plain Sensorcaine with     epinephrine and 4 mg of morphine.  BLOOD LOSS:  Minimal.  COMPLICATIONS:  None.  INDICATIONS:  Mr. Micke is a 55 year old gentleman with previous right knee arthroscopy for lateral meniscal tear, felt a pop in his knee about 3 weeks ago.  An MRI was obtained and he had new midbody lateral meniscal tear.  He does have recent changes in the lateral compartment as well.  Due to the continued pain in his knee and locking and catching, he wished to proceed with an arthroscopic intervention.  DESCRIPTION OF PROCEDURE:  After informed consent was obtained, the appropriate right knee was marked.  He was brought to the operating room, placed supine on the operating table, general anesthesia was then obtained.  Tourniquet was not applied.  His right leg was prepped and draped from the thigh down to the ankle with DuraPrep and sterile drapes including a sterile stockinette.  A time-out was called and he was identified as the correct patient and correct right knee.  I then made an anterolateral arthroscopy portal through his previous port site, inserted a camera into the knee.  I did not apply any significant effusion.  I went directly to the medial compartment and made an anteromedial incision.  The medial compartment was found to be  intact. The ACL and PCL were intact.  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 Using arthroscopic shaver and biters as well as a soft tissue ablation wand, we debrided this back to a stable margin.  Performing a partial lateral meniscectomy, I then allowed fluid to lavage to the knee.  I removed all instrumentation from the knee and drained the fluid from the knee.  I then closed portal site with interrupted nylon suture and infiltrated a mixture of morphine and Marcaine into the knee.  A Xeroform followed by well-padded sterile dressing was applied and the patient was awakened, extubated, and taken to the recovery room in stable condition.  All final counts were correct.  There were no complications noted.     Vanita Panda. Magnus Ivan, M.D.     CYB/MEDQ  D:  03/31/2011  T:  03/31/2011  Job:  [id number]  Electronically Signed by Doneen Poisson M.D. on 04/11/2011 09:10:25 PM

## 2011-04-13 LAB — DIFFERENTIAL
Basophils Relative: 1
Eosinophils Absolute: 0
Monocytes Absolute: 0.6
Monocytes Relative: 3
Neutrophils Relative %: 80 — ABNORMAL HIGH

## 2011-04-13 LAB — URINE MICROSCOPIC-ADD ON

## 2011-04-13 LAB — BASIC METABOLIC PANEL
CO2: 25
Chloride: 103
Creatinine, Ser: 0.82
GFR calc Af Amer: >60
Glucose, Bld: 183 — ABNORMAL HIGH

## 2011-04-13 LAB — URINALYSIS, ROUTINE W REFLEX MICROSCOPIC
Nitrite: NEGATIVE
Protein, ur: 30 — AB
Specific Gravity, Urine: 1.023
Urobilinogen, UA: 1

## 2011-04-13 LAB — POCT HEMOGLOBIN-HEMACUE
Hemoglobin: 16.4
Operator id: 268271

## 2011-04-13 LAB — CBC
Hemoglobin: 15.1
MCHC: 32.5
MCV: 90.6
RBC: 5.13
RDW: 14.6 — ABNORMAL HIGH

## 2011-10-22 ENCOUNTER — Encounter (HOSPITAL_BASED_OUTPATIENT_CLINIC_OR_DEPARTMENT_OTHER): Payer: Self-pay | Admitting: Emergency Medicine

## 2011-10-22 ENCOUNTER — Emergency Department (HOSPITAL_BASED_OUTPATIENT_CLINIC_OR_DEPARTMENT_OTHER)
Admission: EM | Admit: 2011-10-22 | Discharge: 2011-10-22 | Disposition: A | Discharge status: Home / Self Care | Payer: Medicaid Other | Attending: Emergency Medicine | Admitting: Emergency Medicine

## 2011-10-22 ENCOUNTER — Emergency Department (INDEPENDENT_AMBULATORY_CARE_PROVIDER_SITE_OTHER): Payer: Medicaid Other

## 2011-10-22 DIAGNOSIS — K4090 Unilateral inguinal hernia, without obstruction or gangrene, not specified as recurrent: Secondary | ICD-10-CM

## 2011-10-22 DIAGNOSIS — Z87442 Personal history of urinary calculi: Secondary | ICD-10-CM

## 2011-10-22 DIAGNOSIS — I10 Essential (primary) hypertension: Secondary | ICD-10-CM

## 2011-10-22 DIAGNOSIS — R109 Unspecified abdominal pain: Secondary | ICD-10-CM | POA: Insufficient documentation

## 2011-10-22 HISTORY — DX: Essential (primary) hypertension: I10

## 2011-10-22 HISTORY — DX: Calculus of kidney: N20.0

## 2011-10-22 HISTORY — DX: Personal history of transient ischemic attack (TIA), and cerebral infarction without residual deficits: Z86.73

## 2011-10-22 LAB — DIFFERENTIAL
Eosinophils Absolute: 0.3 10*3/uL (ref 0.0–0.7)
Lymphocytes Relative: 27 % (ref 12–46)
Lymphs Abs: 3.4 10*3/uL (ref 0.7–4.0)
Neutrophils Relative %: 63 % (ref 43–77)

## 2011-10-22 LAB — COMPREHENSIVE METABOLIC PANEL
ALT: 14 U/L (ref 0–53)
Alkaline Phosphatase: 84 U/L (ref 39–117)
CO2: 27 mEq/L (ref 19–32)
GFR calc Af Amer: >90 mL/min (ref >90)
Glucose, Bld: 81 mg/dL (ref 70–99)
Potassium: 3.9 mEq/L (ref 3.5–5.1)
Sodium: 140 mEq/L (ref 135–145)
Total Protein: 7.1 g/dL (ref 6.0–8.3)

## 2011-10-22 LAB — URINALYSIS, ROUTINE W REFLEX MICROSCOPIC
Leukocytes, UA: NEGATIVE
Nitrite: NEGATIVE
Specific Gravity, Urine: 1.023 (ref 1.005–1.030)
Urobilinogen, UA: 1 mg/dL (ref 0.0–1.0)

## 2011-10-22 LAB — CBC
Platelets: 239 10*3/uL (ref 150–400)
RBC: 5.57 MIL/uL (ref 4.22–5.81)
WBC: 12.7 10*3/uL — ABNORMAL HIGH (ref 4.0–10.5)

## 2011-10-22 MED ORDER — HYDROMORPHONE HCL PF 2 MG/ML IJ SOLN
2.0000 mg | Freq: Once | INTRAMUSCULAR | Status: AC
  Administered 2011-10-22: 2 mg via INTRAVENOUS
  Filled 2011-10-22: qty 1

## 2011-10-22 MED ORDER — ONDANSETRON HCL 4 MG/2ML IJ SOLN
4.0000 mg | Freq: Once | INTRAMUSCULAR | Status: AC
  Administered 2011-10-22: 4 mg via INTRAVENOUS
  Filled 2011-10-22: qty 2

## 2011-10-22 NOTE — ED Provider Notes (Addendum)
History     CSN: [id number]  Arrival date & time 10/22/11  1014   First MD Initiated Contact with Patient 10/22/11 1052      Chief Complaint  Patient presents with  . Flank Pain    (Consider location/radiation/quality/duration/timing/severity/associated sxs/prior treatment) HPI Complains of bilateral low low back pain ,, left side greater than right and left testicle pain onset several days ago becoming much worse last night . Accompanying symptoms include nausea, no vomiting. Treated with Advil without relief nothing makes symptoms better or worse pain feels like kidney stone she's had in the past. Pain is moderate to severe. No fever no other associated symptoms Past Medical History  Diagnosis Date  . Kidney calculi   . Hypertension   . History of TIAs     Past Surgical History  Procedure Date  . Total hip arthroplasty   . Joint replacement   . Knee surgery    Hernia repair History reviewed. No pertinent family history.  History  Substance Use Topics  . Smoking status: Current Everyday Smoker    Types: Cigarettes  . Smokeless tobacco: Not on file  . Alcohol Use: No      Review of Systems  Gastrointestinal: Positive for nausea and abdominal pain.  Genitourinary: Positive for testicular pain.  All other systems reviewed and are negative.    Allergies  Morphine  Home Medications   Current Outpatient Rx  Name Route Sig Dispense Refill  . ASPIRIN-DIPYRIDAMOLE ER 25-200 MG PO CP12 Oral Take 1 capsule by mouth 2 (two) times daily.    Marland Kitchen FEXOFENADINE HCL 180 MG PO TABS Oral Take 180 mg by mouth daily.    Marland Kitchen LISINOPRIL-HYDROCHLOROTHIAZIDE 20-12.5 MG PO TABS Oral Take 1 tablet by mouth 2 (two) times daily.      BP 167/94  Pulse 89  Temp(Src) 98.5 F (36.9 C) (Oral)  Resp 22  Ht 5\' 11"  (1.803 m)  Wt 281 lb (127.461 kg)  BMI 39.19 kg/m2  SpO2 98%  Physical Exam  Nursing note and vitals reviewed. Constitutional: He appears well-developed and  well-nourished. He appears distressed.       Appears uncomfortable, mild  HENT:  Head: Normocephalic and atraumatic.  Eyes: Conjunctivae are normal. Pupils are equal, round, and reactive to light.  Neck: Neck supple. No tracheal deviation present. No thyromegaly present.  Cardiovascular: Normal rate and regular rhythm.   No murmur heard. Pulmonary/Chest: Effort normal and breath sounds normal.  Abdominal: Soft. Bowel sounds are normal. He exhibits no distension. There is tenderness.       Obese mild periumbilical tenderness,  Genitourinary:       Testicles normal, no flank tenderness  Musculoskeletal: Normal range of motion. He exhibits no edema and no tenderness.  Neurological: He is alert. Coordination normal.  Skin: Skin is warm and dry. No rash noted.  Psychiatric: He has a normal mood and affect.    ED Course  Procedures (including critical care time)  Labs Reviewed - No data to display No results found.   No diagnosis found.  12:40 PM pain improved nausea resolved  MDM  Inguinal hernias not likely to cause the patient's pain Plan Tylenol for pain Followup primary care Dr. Harland German mwedical this week,  Blood pressure recheck Diagnosis #1 nonspecific abdominal pain #2 hypertension        Doug Sou, MD 10/22/11 1244  Doug Sou, MD 10/22/11 1521

## 2011-10-22 NOTE — Discharge Instructions (Signed)
Abdominal Pain Many things can cause belly (abdominal) pain. Most times, the belly pain is not dangerous. The amount of belly pain does not tell how serious the problem may be. Many cases of belly pain can be watched and treated at home. HOME CARE   Do not take medicines that help you go poop (laxatives) unless told to by your doctor.   Only take medicine as told by your doctor.   Eat or drink as told by your doctor. Your doctor will tell you if you should be on a special diet.  GET HELP RIGHT AWAY IF:   The pain does not go away.   You have a fever.   You keep throwing up (vomiting).   The pain changes and is only in the right or left part of the belly.   You have bloody or tarry looking poop.  MAKE SURE YOU:   Understand these instructions.   Will watch your condition.   Will get help right away if you are not doing well or get worse.  Document Released: 11/29/2007 Document Revised: 06/01/2011 Document Reviewed: 06/28/2009 [facility] Patient Information 2012 [place], [place].  No driving for the next 24 hours as you were given narcotic pain medicine today. The CT scan of your abdomen shows that you have a small hernia in your right groin area , which is not likely the cause of your pain today .Call your doctor at [facility] tomorrow to arrange to be seen in the office this week. Your blood pressure should be rechecked within the next week. Return if your condition worsens for any reason.

## 2011-10-22 NOTE — ED Notes (Signed)
Pt requests a disc for his images to take to his doctor, radiology notified.

## 2011-10-22 NOTE — ED Notes (Signed)
Pt having left flank pain since last night.  No dysuria. Some nausea.

## 2011-10-22 NOTE — ED Notes (Signed)
Patient transported to CT 

## 2011-10-25 ENCOUNTER — Ambulatory Visit
Admission: RE | Admit: 2011-10-25 | Discharge: 2011-10-25 | Disposition: A | Discharge status: Home / Self Care | Payer: Medicaid Other | Source: Ambulatory Visit | Attending: Family Medicine | Admitting: Family Medicine

## 2011-10-25 ENCOUNTER — Other Ambulatory Visit: Payer: Self-pay | Admitting: Family Medicine

## 2011-10-25 DIAGNOSIS — R1031 Right lower quadrant pain: Secondary | ICD-10-CM

## 2011-10-25 MED ORDER — IOHEXOL 300 MG/ML  SOLN
125.0000 mL | Freq: Once | INTRAMUSCULAR | Status: AC | PRN
  Administered 2011-10-25: 125 mL via INTRAVENOUS

## 2011-10-25 MED ORDER — IOHEXOL 300 MG/ML  SOLN
30.0000 mL | Freq: Once | INTRAMUSCULAR | Status: AC | PRN
  Administered 2011-10-25: 30 mL via ORAL

## 2011-10-26 ENCOUNTER — Encounter (INDEPENDENT_AMBULATORY_CARE_PROVIDER_SITE_OTHER): Payer: Self-pay | Admitting: Surgery

## 2011-10-26 ENCOUNTER — Ambulatory Visit (INDEPENDENT_AMBULATORY_CARE_PROVIDER_SITE_OTHER): Payer: Medicaid Other | Admitting: Surgery

## 2011-10-26 VITALS — BP 182/98 | HR 85 | Temp 96.4°F | Ht 70.0 in | Wt 314.0 lb

## 2011-10-26 DIAGNOSIS — K4090 Unilateral inguinal hernia, without obstruction or gangrene, not specified as recurrent: Secondary | ICD-10-CM

## 2011-10-26 NOTE — Progress Notes (Signed)
Patient ID: Scott Holden, male   DOB: 07-19-1955, 56 y.o.   MRN: [id number]  Chief Complaint  Patient presents with  . Pre-op Exam    eval RIH    HPI Scott Holden is a 56 y.o. male.  This gentleman was referred by the emergency department for evaluation of a right inguinal hernia. He was having bilateral lower abdominal pain and back pain as well as pain hurting in his left testicle so presented to the emergency department. He has a history of multiple kidney stones. During the workup he had a CAT scan performed which showed a small incidental right inguinal hernia containing only fat. His CAT scans are otherwise unremarkable. He reports he has never noticed a hernia. He has never had pain in his groin and never noticed a bulge. HPI  Past Medical History  Diagnosis Date  . Kidney calculi   . Hypertension   . History of TIAs   . Arthritis   . Stroke     Past Surgical History  Procedure Date  . Total hip arthroplasty   . Joint replacement   . Knee surgery   . Hernia repair 2003    Family History  Problem Relation Age of Onset  . Cancer Maternal Grandfather     prostate    Social History History  Substance Use Topics  . Smoking status: Current Everyday Smoker    Types: Cigarettes  . Smokeless tobacco: Not on file  . Alcohol Use: No    Allergies  Allergen Reactions  . Morphine     REACTION: hives, rash    Current Outpatient Prescriptions  Medication Sig Dispense Refill  . dipyridamole-aspirin (AGGRENOX) 25-200 MG per 12 hr capsule Take 1 capsule by mouth 2 (two) times daily.      . fexofenadine (ALLEGRA) 180 MG tablet Take 180 mg by mouth daily.      Marland Kitchen HYDROcodone-acetaminophen (VICODIN) 5-500 MG per tablet Take 1 tablet by mouth every 6 (six) hours as needed.      Marland Kitchen lisinopril-hydrochlorothiazide (PRINZIDE,ZESTORETIC) 20-12.5 MG per tablet Take 1 tablet by mouth 2 (two) times daily.        Review of Systems Review of Systems  Constitutional: Negative for fever,  chills and unexpected weight change.  HENT: Negative for hearing loss, congestion, sore throat, trouble swallowing and voice change.   Eyes: Negative for visual disturbance.  Respiratory: Negative for cough and wheezing.   Cardiovascular: Negative for chest pain, palpitations and leg swelling.  Gastrointestinal: Positive for abdominal pain. Negative for nausea, vomiting, diarrhea, constipation, blood in stool, abdominal distention, anal bleeding and rectal pain.  Genitourinary: Negative for hematuria and difficulty urinating.  Musculoskeletal: Positive for myalgias, back pain, joint swelling and gait problem. Negative for arthralgias.  Skin: Negative for rash and wound.  Neurological: Negative for seizures, syncope, weakness and headaches.  Hematological: Negative for adenopathy. Does not bruise/bleed easily.  Psychiatric/Behavioral: Negative for confusion.    Blood pressure 182/98, pulse 85, temperature 96.4 F (35.8 C), temperature source Temporal, height 5\' 10"  (1.778 m), weight 314 lb (142.429 kg), SpO2 96.00%.  Physical Exam Physical Exam  Constitutional: He is oriented to person, place, and time. No distress.       Morbidly obese gentleman  HENT:  Head: Normocephalic and atraumatic.  Right Ear: External ear normal.  Left Ear: External ear normal.  Nose: Nose normal.  Mouth/Throat: Oropharynx is clear and moist. No oropharyngeal exudate.  Eyes: Conjunctivae are normal. Right eye exhibits no discharge. Left  eye exhibits no discharge. No scleral icterus.  Neck: Normal range of motion. Neck supple. No tracheal deviation present. No thyromegaly present.  Cardiovascular: Normal rate, regular rhythm, normal heart sounds and intact distal pulses.   No murmur heard. Pulmonary/Chest: Effort normal and breath sounds normal. No respiratory distress. He has no wheezes. He has no rales.  Abdominal: Soft. Bowel sounds are normal. He exhibits no distension. There is no tenderness. There is no  rebound.       Morbidly obese abdomen  Incidental, tiny right inguinal hernia  Musculoskeletal: Normal range of motion. He exhibits no edema and no tenderness.  Lymphadenopathy:    He has no cervical adenopathy.  Neurological: He is alert and oriented to person, place, and time.  Skin: Skin is warm and dry. No rash noted. He is not diaphoretic. No erythema.  Psychiatric: His behavior is normal. Judgment normal.    Data Reviewed I have reviewed his CAT scan  Assessment    Right inguinal hernia    Plan    Based on his history and exam, I suspect this hernia is incidental and asymptomatic. I did not believe it was the cause of his pain. I suspect he probably a passed kidney stone. He reports he does is quite often. He elected to follow hernia expectantly which I feel is very reasonable. He has a lot of comorbidities. I will see him back as needed and less he starts developing symptoms from the hernia       Scott Holden A 10/26/2011, 4:03 PM

## 2011-11-07 ENCOUNTER — Ambulatory Visit: Payer: Medicaid Other | Admitting: Physical Therapy

## 2011-11-16 ENCOUNTER — Ambulatory Visit: Payer: Medicaid Other | Attending: Family Medicine | Admitting: Physical Therapy

## 2011-11-16 DIAGNOSIS — R293 Abnormal posture: Secondary | ICD-10-CM | POA: Insufficient documentation

## 2011-11-16 DIAGNOSIS — IMO0001 Reserved for inherently not codable concepts without codable children: Secondary | ICD-10-CM | POA: Insufficient documentation

## 2011-11-16 DIAGNOSIS — R5381 Other malaise: Secondary | ICD-10-CM | POA: Insufficient documentation

## 2011-11-16 DIAGNOSIS — R262 Difficulty in walking, not elsewhere classified: Secondary | ICD-10-CM | POA: Insufficient documentation

## 2011-11-16 DIAGNOSIS — M2550 Pain in unspecified joint: Secondary | ICD-10-CM | POA: Insufficient documentation

## 2011-11-27 ENCOUNTER — Encounter: Payer: Medicaid Other | Admitting: Physical Therapy

## 2011-12-04 ENCOUNTER — Ambulatory Visit: Payer: Medicaid Other | Attending: Family Medicine | Admitting: Physical Therapy

## 2011-12-04 DIAGNOSIS — R262 Difficulty in walking, not elsewhere classified: Secondary | ICD-10-CM | POA: Insufficient documentation

## 2011-12-04 DIAGNOSIS — R293 Abnormal posture: Secondary | ICD-10-CM | POA: Insufficient documentation

## 2011-12-04 DIAGNOSIS — IMO0001 Reserved for inherently not codable concepts without codable children: Secondary | ICD-10-CM | POA: Insufficient documentation

## 2011-12-04 DIAGNOSIS — M2550 Pain in unspecified joint: Secondary | ICD-10-CM | POA: Insufficient documentation

## 2011-12-04 DIAGNOSIS — R5381 Other malaise: Secondary | ICD-10-CM | POA: Insufficient documentation

## 2011-12-11 ENCOUNTER — Encounter: Payer: Medicaid Other | Admitting: Physical Therapy

## 2011-12-18 ENCOUNTER — Encounter: Payer: Medicaid Other | Admitting: Physical Therapy

## 2012-04-26 ENCOUNTER — Observation Stay (HOSPITAL_COMMUNITY)
Admission: EM | Admit: 2012-04-26 | Discharge: 2012-04-29 | Disposition: A | Discharge status: Home / Self Care | Payer: Medicaid Other | Attending: Internal Medicine | Admitting: Internal Medicine

## 2012-04-26 ENCOUNTER — Encounter (HOSPITAL_COMMUNITY): Payer: Self-pay | Admitting: Emergency Medicine

## 2012-04-26 ENCOUNTER — Emergency Department (HOSPITAL_COMMUNITY): Payer: Medicaid Other

## 2012-04-26 DIAGNOSIS — R42 Dizziness and giddiness: Secondary | ICD-10-CM | POA: Insufficient documentation

## 2012-04-26 DIAGNOSIS — F17200 Nicotine dependence, unspecified, uncomplicated: Secondary | ICD-10-CM | POA: Diagnosis present

## 2012-04-26 DIAGNOSIS — M5137 Other intervertebral disc degeneration, lumbosacral region: Secondary | ICD-10-CM

## 2012-04-26 DIAGNOSIS — R55 Syncope and collapse: Principal | ICD-10-CM | POA: Diagnosis present

## 2012-04-26 DIAGNOSIS — M545 Low back pain, unspecified: Secondary | ICD-10-CM | POA: Insufficient documentation

## 2012-04-26 DIAGNOSIS — Z8673 Personal history of transient ischemic attack (TIA), and cerebral infarction without residual deficits: Secondary | ICD-10-CM | POA: Insufficient documentation

## 2012-04-26 DIAGNOSIS — M51379 Other intervertebral disc degeneration, lumbosacral region without mention of lumbar back pain or lower extremity pain: Secondary | ICD-10-CM

## 2012-04-26 DIAGNOSIS — M25519 Pain in unspecified shoulder: Secondary | ICD-10-CM

## 2012-04-26 DIAGNOSIS — E785 Hyperlipidemia, unspecified: Secondary | ICD-10-CM | POA: Diagnosis present

## 2012-04-26 DIAGNOSIS — Z23 Encounter for immunization: Secondary | ICD-10-CM | POA: Insufficient documentation

## 2012-04-26 DIAGNOSIS — D72829 Elevated white blood cell count, unspecified: Secondary | ICD-10-CM | POA: Insufficient documentation

## 2012-04-26 DIAGNOSIS — I10 Essential (primary) hypertension: Secondary | ICD-10-CM | POA: Diagnosis present

## 2012-04-26 DIAGNOSIS — G459 Transient cerebral ischemic attack, unspecified: Secondary | ICD-10-CM | POA: Diagnosis present

## 2012-04-26 LAB — CBC WITH DIFFERENTIAL/PLATELET
Basophils Relative: 0 % (ref 0–1)
Eosinophils Absolute: 0.3 10*3/uL (ref 0.0–0.7)
Eosinophils Relative: 2 % (ref 0–5)
HCT: 46 % (ref 39.0–52.0)
Hemoglobin: 15.8 g/dL (ref 13.0–17.0)
MCH: 30 pg (ref 26.0–34.0)
MCHC: 34.3 g/dL (ref 30.0–36.0)
MCV: 87.3 fL (ref 78.0–100.0)
Monocytes Absolute: 0.8 10*3/uL (ref 0.1–1.0)
Monocytes Relative: 6 % (ref 3–12)
RDW: 14 % (ref 11.5–15.5)

## 2012-04-26 LAB — COMPREHENSIVE METABOLIC PANEL
Albumin: 3.2 g/dL — ABNORMAL LOW (ref 3.5–5.2)
BUN: 10 mg/dL (ref 6–23)
Calcium: 9.2 mg/dL (ref 8.4–10.5)
Chloride: 105 mEq/L (ref 96–112)
Creatinine, Ser: 0.82 mg/dL (ref 0.50–1.35)
Total Bilirubin: 0.3 mg/dL (ref 0.3–1.2)
Total Protein: 6.3 g/dL (ref 6.0–8.3)

## 2012-04-26 LAB — TROPONIN I: Troponin I: <0.3 ng/mL

## 2012-04-26 MED ORDER — OXYCODONE-ACETAMINOPHEN 5-325 MG PO TABS
2.0000 | ORAL_TABLET | Freq: Once | ORAL | Status: AC
  Administered 2012-04-26: 2 via ORAL
  Filled 2012-04-26: qty 2

## 2012-04-26 MED ORDER — ONDANSETRON 4 MG PO TBDP
4.0000 mg | ORAL_TABLET | Freq: Once | ORAL | Status: AC
  Administered 2012-04-26: 4 mg via ORAL
  Filled 2012-04-26: qty 1

## 2012-04-26 NOTE — ED Notes (Signed)
Patient back from x-ray/CT.  Currently sitting up in bed; no respiratory or acute distress noted.  Patient updated on plan of care; informed patient that we are currently waiting on EDP to come and talk about CT and x-ray results.  Patient denies any needs at this time; will continue to monitor.

## 2012-04-26 NOTE — ED Provider Notes (Signed)
History     CSN: [id number]  Arrival date & time 04/26/12  2020   First MD Initiated Contact with Patient 04/26/12 2056      Chief Complaint  Patient presents with  . Dizziness  . Transient Ischemic Attack    (Consider location/radiation/quality/duration/timing/severity/associated sxs/prior treatment) HPI TODRICK SIEDSCHLAG is a 56 y.o. male here after having episode this AM of passing out. 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 Denies hitting his head but not complains of back pain of moderate intensity. The episode occurred at home. The patient was by himself. It occurred suddenly and lasted an unknown period of time. No prior treatment today for this. Associated symptoms includes pins and needles sensation in left. No fever. No AMS.   Past Medical History  Diagnosis Date  . Kidney calculi   . Hypertension   . History of TIAs   . Arthritis   . Stroke     Past Surgical History  Procedure Date  . Total hip arthroplasty   . Joint replacement   . Knee surgery   . Hernia repair 2003    Family History  Problem Relation Age of Onset  . Cancer Maternal Grandfather     prostate    History  Substance Use Topics  . Smoking status: Current Every Day Smoker    Types: Cigarettes  . Smokeless tobacco: Not on file  . Alcohol Use: No      Review of Systems Negative for respiratory distress, cough. Negative for vomiting, diarrhea.  All other systems reviewed and negative unless noted in HPI.    Allergies  Morphine  Home Medications   Current Outpatient Rx  Name Route Sig Dispense Refill  . ASPIRIN-DIPYRIDAMOLE ER 25-200 MG PO CP12 Oral Take 1 capsule by mouth 2 (two) times daily.    . IBUPROFEN 200 MG PO TABS Oral Take 600-800 mg by mouth every 6 (six) hours as needed. For pain    . LISINOPRIL-HYDROCHLOROTHIAZIDE 20-12.5 MG PO TABS Oral Take 1 tablet by mouth 2 (two) times daily.    Marland Kitchen LISINOPRIL-HYDROCHLOROTHIAZIDE 20-25 MG PO  TABS Oral Take 1 tablet by mouth 2 (two) times daily.    . OXYCODONE-ACETAMINOPHEN 5-325 MG PO TABS Oral Take 1 tablet by mouth every 4 (four) hours as needed. For pain    . VERAPAMIL HCL 80 MG PO TABS Oral Take 80 mg by mouth 3 (three) times daily.      BP 166/88  Pulse 83  Temp 98.4 F (36.9 C) (Oral)  Resp 12  SpO2 97%  Physical Exam Nursing note and vitals reviewed.  Constitutional: Pt is alert and appears stated age. Oropharynx: Airway open without erythema or exudate. Respiratory: No respiratory distress. Equal breathing bilaterally. CV: Extremities warm and well perfused. Neuro: No motor nor sensory deficit. GCS 15. CN II-XII intact.  Head: Normocephalic and atraumatic. Eyes: No conjunctivitis, no scleral icterus. Neck: Supple, no mass. Chest: Non-tender. Abdomen: Soft, non-tender MSK: Extremities are atraumatic without deformity. L spine tenderness.  Skin: No rash, no wounds.  ED Course  Procedures (including critical care time)  Labs Reviewed  CBC WITH DIFFERENTIAL - Abnormal; Notable for the following:    WBC 13.6 (*)     Neutro Abs 8.2 (*)     Lymphs Abs 4.2 (*)     All other components within normal limits  COMPREHENSIVE METABOLIC PANEL - Abnormal; Notable for the following:    Glucose, Bld 103 (*)  Albumin 3.2 (*)     All other components within normal limits  CBC - Abnormal; Notable for the following:    WBC 12.3 (*)     All other components within normal limits  TROPONIN I  BASIC METABOLIC PANEL   Dg Lumbar Spine 2-3 Views  04/26/2012  *RADIOLOGY REPORT*  Clinical Data: Dizziness.  TIA 12 hours ago lumbar pain.  LUMBAR SPINE - 2-3 VIEW  Comparison: CT abdomen and pelvis 10/25/2011  Findings: Five lumbar type vertebrae.  Diffuse degenerative changes with narrowed lumbar interspaces and endplate hypertrophic changes. Degenerative changes in the lumbar facet joints.  Normal alignment of the lumbar vertebrae.  No vertebral compression deformities.  No  focal bone lesion or bone destruction.  Bone cortex and trabecular architecture appear intact.  No significant change since previous study, allowing for technical differences.  IMPRESSION: Degenerative changes in the lumbar spine.  No displaced fractures or acute changes demonstrated.   Original Report Authenticated By: Burman Nieves, M.D.    Ct Head Wo Contrast  04/26/2012  *RADIOLOGY REPORT*  Clinical Data: Dizziness.  Left arm tingling.  Weakness in the right leg.  CT HEAD WITHOUT CONTRAST  Technique:  Contiguous axial images were obtained from the base of the skull through the vertex without contrast.  Comparison: Head CT 01/07/2009.  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  Mild cerebral atrophy is unchanged.  Patchy and confluent areas of decreased attenuation throughout the deep and periventricular white matter of the cerebral hemispheres bilaterally, most severe in the parietal and occipital regions, similar to the prior examination 01/07/2009, most compatible with chronic microvascular ischemic disease.  No definite acute intracranial abnormalities.  Specifically, no signs of acute intracerebral hemorrhage, no large vascular territory acute/subacute cerebral ischemia, no focal mass, mass effect, hydrocephalus or abnormal intra or extra-axial fluid collections. Visualized paranasal sinuses and mastoids are well pneumatized.  No acute displaced skull fractures are identified.  IMPRESSION: 1.  No acute intracranial abnormalities. 2.  The appearance of the brain is similar to prior study 01/07/2009, as detailed above.   Original Report Authenticated By: Trudie Reed, M.D.      1. Syncope       MDM  56 y.o. male here with syncopal episode today.  Pertinent past problems include HTN, TIA, multiple orthopedic procedures.  Medications/interventions:  PO percocet for back pain.  Data reviewed: EKG  ordered and interpreted by me: normal rate, sinus rhythm, normal QRS duration, no ST elevation.  Lab tests ordered and reviewed by me: CBC with slight leukocytosis. CMP unremarkable. Troponin low.  I independently viewed the following imaging studies and reviewed radiology's interpretation as summarized: XR l spine without fx. CT head with NAICA.  Course of care: Pt stable on re-eval. Plan to admit. Hospitalist called.   Medical Decision Making discussed with ED attending Dr. Manus Gunning.          Charm Barges, MD 04/27/12 1408

## 2012-04-26 NOTE — ED Notes (Addendum)
Patient has had history of TIAs for the past three years; at least once weekly.  Patient reports that TIAs have increased in frequency since Tuesday of this week -- patient has been averaging two TIAs a day.  Reportsthat he normally gets lines on his face from busted blood vessels during his TIAs and that he normally gets numb on the right side; patient denies any problems with his right side during the past week.  Reports pain in neck, shoulders, and upper back from this week's TIAs.  Patient also reports tingling in left arm that started with the TIAs on Tuesday.  Patient reports that while on the toilet today, he stood up, blacked out, and fell down.  When patient came to, he was by himself (syncopal episode unwitnessed; does not know if he hit head).  Patient reports after he came to, he was really dizzy and vomited x1.  Currently, patient reports tingling in left arm and pain in back, shoulders, and neck.  Denies any other symptoms at this time.  Patient alertt and oriented x4; PERRL present.  Will continue to monitor.

## 2012-04-26 NOTE — ED Notes (Signed)
Patient currently sitting up in bed; no respiratory or acute distress noted.  Patient updated on plan of care; informed patient that we are currently waiting on disposition from EDP; denies any other needs at this time.  Patient given apple juice, per request.  Family member given coffee.  Will continue to monitor.

## 2012-04-26 NOTE — ED Notes (Signed)
Patient currently resting quietly in bed; no respiratory or acute distress noted.  Patient updated on plan of care; informed patient that we are currently waiting on further orders/disposition from EDP.  Patient denies any needs at this time; will continue to monitor. 

## 2012-04-26 NOTE — ED Notes (Signed)
EKG done at 2026; copy given to and signed by Dr. Manus Gunning.

## 2012-04-26 NOTE — ED Notes (Signed)
Patient reports having TIAs weekly for the past three years; Neurologist aware.  Patient reports that he averaged about two TIAs a day since Tuesday; Last TIA was around 1900 this evening; patient became dizzy, had tingling in left arm, and weakness in right leg.  Patient denies history of stroke.  Patient alert and oriented x4; PERRL present.

## 2012-04-27 ENCOUNTER — Inpatient Hospital Stay (HOSPITAL_COMMUNITY): Payer: Medicaid Other

## 2012-04-27 DIAGNOSIS — R6889 Other general symptoms and signs: Secondary | ICD-10-CM

## 2012-04-27 DIAGNOSIS — R55 Syncope and collapse: Secondary | ICD-10-CM

## 2012-04-27 LAB — URINALYSIS, ROUTINE W REFLEX MICROSCOPIC
Bilirubin Urine: NEGATIVE
Protein, ur: NEGATIVE mg/dL
Specific Gravity, Urine: 1.018 (ref 1.005–1.030)
Urobilinogen, UA: 0.2 mg/dL (ref 0.0–1.0)

## 2012-04-27 LAB — CBC
HCT: 44.7 % (ref 39.0–52.0)
MCHC: 33.8 g/dL (ref 30.0–36.0)
MCV: 87.5 fL (ref 78.0–100.0)
RDW: 14.2 % (ref 11.5–15.5)

## 2012-04-27 LAB — BASIC METABOLIC PANEL
BUN: 10 mg/dL (ref 6–23)
CO2: 26 mEq/L (ref 19–32)
Chloride: 104 mEq/L (ref 96–112)
Creatinine, Ser: 0.84 mg/dL (ref 0.50–1.35)

## 2012-04-27 LAB — URINE MICROSCOPIC-ADD ON

## 2012-04-27 MED ORDER — SODIUM CHLORIDE 0.9 % IJ SOLN: 3.0000 mL | INTRAMUSCULAR | Status: DC | PRN

## 2012-04-27 MED ORDER — HYDROMORPHONE HCL PF 1 MG/ML IJ SOLN
0.5000 mg | INTRAMUSCULAR | Status: DC | PRN
  Administered 2012-04-27 (×3): 1 mg via INTRAVENOUS
  Administered 2012-04-27: 0.5 mg via INTRAVENOUS
  Administered 2012-04-28 (×2): 1 mg via INTRAVENOUS
  Filled 2012-04-27 (×6): qty 1

## 2012-04-27 MED ORDER — ONDANSETRON HCL 4 MG/2ML IJ SOLN
4.0000 mg | Freq: Four times a day (QID) | INTRAMUSCULAR | Status: DC | PRN
  Administered 2012-04-28 (×2): 4 mg via INTRAVENOUS
  Filled 2012-04-27 (×2): qty 2

## 2012-04-27 MED ORDER — ACETAMINOPHEN 650 MG RE SUPP: 650.0000 mg | Freq: Four times a day (QID) | RECTAL | Status: DC | PRN

## 2012-04-27 MED ORDER — SODIUM CHLORIDE 0.9 % IJ SOLN
3.0000 mL | Freq: Two times a day (BID) | INTRAMUSCULAR | Status: DC
  Administered 2012-04-27 – 2012-04-29 (×5): 3 mL via INTRAVENOUS

## 2012-04-27 MED ORDER — ALUM & MAG HYDROXIDE-SIMETH 200-200-20 MG/5ML PO SUSP: 30.0000 mL | Freq: Four times a day (QID) | ORAL | Status: DC | PRN

## 2012-04-27 MED ORDER — SODIUM CHLORIDE 0.9 % IV SOLN: INTRAVENOUS | Status: DC

## 2012-04-27 MED ORDER — ONDANSETRON HCL 4 MG/2ML IJ SOLN: 4.0000 mg | Freq: Three times a day (TID) | INTRAMUSCULAR | Status: DC | PRN

## 2012-04-27 MED ORDER — INFLUENZA VIRUS VACC SPLIT PF IM SUSP
0.5000 mL | INTRAMUSCULAR | Status: AC
  Administered 2012-04-28: 0.5 mL via INTRAMUSCULAR
  Filled 2012-04-27: qty 0.5

## 2012-04-27 MED ORDER — ACETAMINOPHEN 325 MG PO TABS
650.0000 mg | ORAL_TABLET | Freq: Four times a day (QID) | ORAL | Status: DC | PRN
  Filled 2012-04-27: qty 2

## 2012-04-27 MED ORDER — ONDANSETRON HCL 4 MG PO TABS
4.0000 mg | ORAL_TABLET | Freq: Four times a day (QID) | ORAL | Status: DC | PRN
  Administered 2012-04-29: 4 mg via ORAL
  Filled 2012-04-27: qty 1

## 2012-04-27 MED ORDER — ENOXAPARIN SODIUM 40 MG/0.4ML ~~LOC~~ SOLN
40.0000 mg | SUBCUTANEOUS | Status: DC
  Administered 2012-04-27 – 2012-04-29 (×3): 40 mg via SUBCUTANEOUS
  Filled 2012-04-27 (×3): qty 0.4

## 2012-04-27 MED ORDER — ASPIRIN-DIPYRIDAMOLE ER 25-200 MG PO CP12
1.0000 | ORAL_CAPSULE | Freq: Two times a day (BID) | ORAL | Status: DC
  Administered 2012-04-27 – 2012-04-29 (×4): 1 via ORAL
  Filled 2012-04-27 (×5): qty 1

## 2012-04-27 MED ORDER — ZOLPIDEM TARTRATE 5 MG PO TABS: 5.0000 mg | ORAL_TABLET | Freq: Every evening | ORAL | Status: DC | PRN

## 2012-04-27 MED ORDER — OXYCODONE HCL 5 MG PO TABS
5.0000 mg | ORAL_TABLET | ORAL | Status: DC | PRN
  Administered 2012-04-27 – 2012-04-28 (×5): 5 mg via ORAL
  Filled 2012-04-27 (×5): qty 1

## 2012-04-27 MED ORDER — SODIUM CHLORIDE 0.9 % IV SOLN: 250.0000 mL | INTRAVENOUS | Status: DC | PRN

## 2012-04-27 MED ORDER — SODIUM CHLORIDE 0.9 % IV SOLN: INTRAVENOUS | Status: AC

## 2012-04-27 NOTE — ED Notes (Signed)
Patient transported to Ultrasound 

## 2012-04-27 NOTE — ED Notes (Signed)
Patient currently resting quietly in bed; no respiratory or acute distress noted.  Patient updated on plan of care; informed patient that EDP has made consult to hospitalist.  Patient denies any other needs at this time; will continue to monitor.  Family present at bedside.

## 2012-04-27 NOTE — ED Notes (Signed)
Dr. Lovell Sheehan at bedside and IV team at bedside.

## 2012-04-27 NOTE — ED Notes (Signed)
Report given to Sheridan, RN on 3W.  No further questions/concerns from RN.  Informed RN that she can call back with any questions/concerns once patient arrives to floor.  Preparing patient for transport.

## 2012-04-27 NOTE — H&P (Signed)
[facility] History and Physical  Scott Holden WUJ:[id number] DOB: [birth year] DOA: 04/26/2012  Referring physician:  PCP: Maryelizabeth Rowan, MD  Specialists:   Chief Complaint: Passed Out  HPI: Scott Holden is a 56 y.o. male who presents to the ED with complaints of having a black out in the AM in his home.  He was home alone and passed out in the bathroom, and does not know how long he was passed out.  He denied having any dizziness or prodrome prior to passing out.  He reports having TIAs and he is being followed by his neurologist Dr. Sandria Manly for this.  He reports his usual TIA is Right sided weakness along with a sensation that his tongue is thick and numb.  He however, states that his opposite side was affected a few days ago during the episode, which he states is unusual.     Review of Systems: The patient denies anorexia, fever, weight loss, vision loss, decreased hearing, hoarseness, chest pain, syncope, dyspnea on exertion, peripheral edema, balance deficits, hemoptysis, abdominal pain, melena, hematochezia, severe indigestion/heartburn, hematuria, incontinence, genital sores, muscle weakness, suspicious skin lesions, transient blindness, difficulty walking, depression, unusual weight change, abnormal bleeding, enlarged lymph nodes, angioedema, and breast masses.    Past Medical History  Diagnosis Date  . Kidney calculi   . Hypertension   . History of TIAs   . Arthritis   . Stroke    Past Surgical History  Procedure Date  . Total hip arthroplasty   . Joint replacement   . Knee surgery   . Hernia repair 2003    Medications:  HOME MEDS: Prior to Admission medications   Medication Sig Start Date End Date Taking? Authorizing Provider  dipyridamole-aspirin (AGGRENOX) 25-200 MG per 12 hr capsule Take 1 capsule by mouth 2 (two) times daily.   Yes Historical Provider, MD  ibuprofen (ADVIL,MOTRIN) 200 MG tablet Take 600-800 mg by mouth every 6 (six) hours as needed. For pain    Yes Historical Provider, MD  lisinopril-hydrochlorothiazide (PRINZIDE,ZESTORETIC) 20-12.5 MG per tablet Take 1 tablet by mouth 2 (two) times daily.   Yes Historical Provider, MD  lisinopril-hydrochlorothiazide (PRINZIDE,ZESTORETIC) 20-25 MG per tablet Take 1 tablet by mouth 2 (two) times daily.   Yes Historical Provider, MD  oxyCODONE-acetaminophen (PERCOCET/ROXICET) 5-325 MG per tablet Take 1 tablet by mouth every 4 (four) hours as needed. For pain   Yes Historical Provider, MD  verapamil (CALAN) 80 MG tablet Take 80 mg by mouth 3 (three) times daily.   Yes Historical Provider, MD    Allergies  Allergen Reactions  . Morphine Hives and Rash   Social History:  reports that he has been smoking Cigarettes.  He does not have any smokeless tobacco history on file. He reports that he does not drink alcohol or use illicit drugs.   Family History  Problem Relation Age of Onset  . Cancer Maternal Grandfather     prostate     Physical Exam:  GEN:  Pleasant 56 year old Obese well developed Caucasian Male examined  and in no acute distress; cooperative with exam Filed Vitals:   04/26/12 2315 04/26/12 2330 04/27/12 0100 04/27/12 0314  BP: 152/89 130/78 140/88 149/69  Pulse: 66 49 62 62  Temp:   98.1 F (36.7 C) 97.8 F (36.6 C)  TempSrc:   Oral Oral  Resp: 19 13 18 18   SpO2: 97% 96% 97% 98%   Blood pressure 149/69, pulse 62, temperature 97.8 F (36.6 C), temperature source  Oral, resp. rate 18, SpO2 98.00%. PSYCH: He is alert and oriented x4; does not appear anxious does not appear depressed; affect is normal HEENT: Normocephalic and Atraumatic,Left sided facial erythematous striae,   Mucous membranes pink; PERRLA; EOM intact; Fundi:  Benign;  No scleral icterus, Nares: Patent, Oropharynx: Clear, Edentulous, Neck:  FROM, no cervical lymphadenopathy nor thyromegaly or carotid bruit; no JVD; Breasts:: Not examined CHEST WALL: No tenderness CHEST: Normal respiration, clear to auscultation  bilaterally HEART: Regular rate and rhythm; no murmurs rubs or gallops BACK: No kyphosis or scoliosis; no CVA tenderness ABDOMEN: Positive Bowel Sounds, Obese, soft non-tender; no masses, no organomegaly, no pannus; no intertriginous candida. Rectal Exam: Not done EXTREMITIES: No bone or joint deformity; age-appropriate arthropathy of the hands and knees; no cyanosis, clubbing or edema; no ulcerations. Genitalia: not examined PULSES: 2+ and symmetric SKIN: Normal hydration no rash or ulceration CNS: Cranial nerves 2-12 grossly intact no focal neurologic deficit   Labs on Admission:  Basic Metabolic Panel:  Lab 04/26/12 1610  NA 141  K 3.9  CL 105  CO2 27  GLUCOSE 103*  BUN 10  CREATININE 0.82  CALCIUM 9.2  MG --  PHOS --   Liver Function Tests:  Lab 04/26/12 2155  AST 13  ALT 10  ALKPHOS 90  BILITOT 0.3  PROT 6.3  ALBUMIN 3.2*   No results found for this basename: LIPASE:5,AMYLASE:5 in the last 168 hours No results found for this basename: AMMONIA:5 in the last 168 hours CBC:  Lab 04/26/12 2155  WBC 13.6*  NEUTROABS 8.2*  HGB 15.8  HCT 46.0  MCV 87.3  PLT 202   Cardiac Enzymes:  Lab 04/26/12 2155  CKTOTAL --  CKMB --  CKMBINDEX --  TROPONINI <0.30    BNP (last 3 results) No results found for this basename: PROBNP:3 in the last 8760 hours CBG: No results found for this basename: GLUCAP:5 in the last 168 hours  Radiological Exams on Admission: Dg Lumbar Spine 2-3 Views  04/26/2012  *RADIOLOGY REPORT*  Clinical Data: Dizziness.  TIA 12 hours ago lumbar pain.  LUMBAR SPINE - 2-3 VIEW  Comparison: CT abdomen and pelvis 10/25/2011  Findings: Five lumbar type vertebrae.  Diffuse degenerative changes with narrowed lumbar interspaces and endplate hypertrophic changes. Degenerative changes in the lumbar facet joints.  Normal alignment of the lumbar vertebrae.  No vertebral compression deformities.  No focal bone lesion or bone destruction.  Bone cortex and  trabecular architecture appear intact.  No significant change since previous study, allowing for technical differences.  IMPRESSION: Degenerative changes in the lumbar spine.  No displaced fractures or acute changes demonstrated.   Original Report Authenticated By: Burman Nieves, M.D.    Ct Head Wo Contrast  04/26/2012  *RADIOLOGY REPORT*  Clinical Data: Dizziness.  Left arm tingling.  Weakness in the right leg.  CT HEAD WITHOUT CONTRAST  Technique:  Contiguous axial images were obtained from the base of the skull through the vertex without contrast.  Comparison: Head CT 01/07/2009.  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  Mild cerebral atrophy is unchanged.  Patchy and confluent areas of decreased attenuation throughout the deep and periventricular white matter of the cerebral hemispheres bilaterally, most severe in the parietal and occipital regions, similar to the prior examination 01/07/2009, most compatible with chronic microvascular ischemic disease.  No definite acute intracranial abnormalities.  Specifically, no signs of acute intracerebral hemorrhage, no large  vascular territory acute/subacute cerebral ischemia, no focal mass, mass effect, hydrocephalus or abnormal intra or extra-axial fluid collections. Visualized paranasal sinuses and mastoids are well pneumatized.  No acute displaced skull fractures are identified.  IMPRESSION: 1.  No acute intracranial abnormalities. 2.  The appearance of the brain is similar to prior study 01/07/2009, as detailed above.   Original Report Authenticated By: Trudie Reed, M.D.     EKG: Independently reviewed.   Assessment/Plan Principal Problem:  *Syncope Active Problems:  HYPERLIPIDEMIA  TOBACCO ABUSE  HYPERTENSION, BENIGN ESSENTIAL  TIA    Plan:    Admit to Observation Telemetry Bed Neuro checks, check Orthostatics MRI of Brain, and Carotid US  ordered Reconcile Home Medications DVT prophylaxis   Code Status:  FULL CODE Family Communication:  Son at Bedside Disposition Plan:  Return to Home  Time spent: 76 Minutes  Ron Parker [facility] Pager [phone]  If 7PM-7AM, please contact night-coverage [email or web address] Password [email or web address] 04/27/2012, 3:17 AM

## 2012-04-27 NOTE — Progress Notes (Signed)
VASCULAR LAB PRELIMINARY  PRELIMINARY  PRELIMINARY  PRELIMINARY  Carotid Dopplers completed.    Preliminary report:  There is no ICA stenosis.  Vertebral artery flow is antegrade.  Scott Holden, 04/27/2012, 2:49 PM

## 2012-04-27 NOTE — ED Notes (Signed)
Patient asleep in bed; no respiratory or acute distress noted.  Will continue to monitor. 

## 2012-04-27 NOTE — ED Notes (Signed)
Calling report now. 

## 2012-04-27 NOTE — Progress Notes (Signed)
[facility] PROGRESS NOTE  MD SMOLA ZOX:[id number] DOB: 27-Apr-1956 DOA: 04/26/2012 PCP: Maryelizabeth Rowan, MD  Assessment/Plan: 1. Syncope:  - admit to telemetry - stroke work up in progress.  - on aggrenox at home. RESUME aggrenox  2. Leukocytosis: unclear etiology. - UA and CXR ordered.   3. DVT prophylaxis.  Code Status: full code Family Communication: none at bedside Disposition Plan: pending     HPI/Subjective: Comfortable. Non dizziness.  Objective: Filed Vitals:   04/27/12 0314 04/27/12 0400 04/27/12 0900 04/27/12 1300  BP: 149/69 169/98 129/73 147/77  Pulse: 62 61 57 64  Temp: 97.8 F (36.6 C) 97.7 F (36.5 C) 97.9 F (36.6 C) 97.4 F (36.3 C)  TempSrc: Oral  Oral Oral  Resp: 18 20 16 18   Height:  5\' 10"  (1.778 m)    Weight:  137.5 kg (303 lb 2.1 oz)    SpO2: 98% 95% 94% 92%    Intake/Output Summary (Last 24 hours) at 04/27/12 1456 Last data filed at 04/27/12 1300  Gross per 24 hour  Intake    843 ml  Output    300 ml  Net    543 ml   Filed Weights   04/27/12 0400  Weight: 137.5 kg (303 lb 2.1 oz)    Exam:   General:  Alert afebrile comfortalble  Cardiovascular: s1s2  Respiratory: CTAB  Abdomen: soft NT ND BS+  Data Reviewed: Basic Metabolic Panel:  Lab 04/27/12 8119 04/26/12 2155  NA 137 141  K 3.7 3.9  CL 104 105  CO2 26 27  GLUCOSE 90 103*  BUN 10 10  CREATININE 0.84 0.82  CALCIUM 8.6 9.2  MG -- --  PHOS -- --   Liver Function Tests:  Lab 04/26/12 2155  AST 13  ALT 10  ALKPHOS 90  BILITOT 0.3  PROT 6.3  ALBUMIN 3.2*   No results found for this basename: LIPASE:5,AMYLASE:5 in the last 168 hours No results found for this basename: AMMONIA:5 in the last 168 hours CBC:  Lab 04/27/12 0640 04/26/12 2155  WBC 12.3* 13.6*  NEUTROABS -- 8.2*  HGB 15.1 15.8  HCT 44.7 46.0  MCV 87.5 87.3  PLT 201 202   Cardiac Enzymes:  Lab 04/26/12 2155  CKTOTAL --  CKMB --  CKMBINDEX --  TROPONINI <0.30   BNP (last  3 results) No results found for this basename: PROBNP:3 in the last 8760 hours CBG: No results found for this basename: GLUCAP:5 in the last 168 hours  No results found for this or any previous visit (from the past 240 hour(s)).   Studies: Dg Lumbar Spine 2-3 Views  04/26/2012  *RADIOLOGY REPORT*  Clinical Data: Dizziness.  TIA 12 hours ago lumbar pain.  LUMBAR SPINE - 2-3 VIEW  Comparison: CT abdomen and pelvis 10/25/2011  Findings: Five lumbar type vertebrae.  Diffuse degenerative changes with narrowed lumbar interspaces and endplate hypertrophic changes. Degenerative changes in the lumbar facet joints.  Normal alignment of the lumbar vertebrae.  No vertebral compression deformities.  No focal bone lesion or bone destruction.  Bone cortex and trabecular architecture appear intact.  No significant change since previous study, allowing for technical differences.  IMPRESSION: Degenerative changes in the lumbar spine.  No displaced fractures or acute changes demonstrated.   Original Report Authenticated By: Burman Nieves, M.D.    Ct Head Wo Contrast  04/26/2012  *RADIOLOGY REPORT*  Clinical Data: Dizziness.  Left arm tingling.  Weakness in the right leg.  CT HEAD WITHOUT CONTRAST  Technique:  Contiguous axial images were obtained from the base of the skull through the vertex without contrast.  Comparison: Head CT 01/07/2009.  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  Mild cerebral atrophy is unchanged.  Patchy and confluent areas of decreased attenuation throughout the deep and periventricular white matter of the cerebral hemispheres bilaterally, most severe in the parietal and occipital regions, similar to the prior examination 01/07/2009, most compatible with chronic microvascular ischemic disease.  No definite acute intracranial abnormalities.  Specifically, no signs of acute intracerebral hemorrhage, no large  vascular territory acute/subacute cerebral ischemia, no focal mass, mass effect, hydrocephalus or abnormal intra or extra-axial fluid collections. Visualized paranasal sinuses and mastoids are well pneumatized.  No acute displaced skull fractures are identified.  IMPRESSION: 1.  No acute intracranial abnormalities. 2.  The appearance of the brain is similar to prior study 01/07/2009, as detailed above.   Original Report Authenticated By: Trudie Reed, M.D.     Scheduled Meds:   . enoxaparin (LOVENOX) injection  40 mg Subcutaneous Q24H  . influenza  inactive virus vaccine  0.5 mL Intramuscular Tomorrow-1000  . ondansetron  4 mg Oral Once  . oxyCODONE-acetaminophen  2 tablet Oral Once  . sodium chloride  3 mL Intravenous Q12H   Continuous Infusions:   . sodium chloride      Principal Problem:  *Syncope Active Problems:  HYPERLIPIDEMIA  TOBACCO ABUSE  HYPERTENSION, BENIGN ESSENTIAL  TIA        Scott Holden  [facility] Pager [phone]. If 8PM-8AM, please contact night-coverage at [email or web address], password [facility] 04/27/2012, 2:56 PM  LOS: 1 day

## 2012-04-28 ENCOUNTER — Inpatient Hospital Stay (HOSPITAL_COMMUNITY): Payer: Medicaid Other

## 2012-04-28 DIAGNOSIS — F17200 Nicotine dependence, unspecified, uncomplicated: Secondary | ICD-10-CM

## 2012-04-28 DIAGNOSIS — M25519 Pain in unspecified shoulder: Secondary | ICD-10-CM

## 2012-04-28 MED ORDER — HYDROMORPHONE HCL PF 1 MG/ML IJ SOLN
1.0000 mg | INTRAMUSCULAR | Status: DC | PRN
  Administered 2012-04-28: 1 mg via INTRAVENOUS
  Administered 2012-04-28: 2 mg via INTRAVENOUS
  Administered 2012-04-29 (×5): 1 mg via INTRAVENOUS
  Filled 2012-04-28 (×4): qty 1
  Filled 2012-04-28: qty 2
  Filled 2012-04-28: qty 1
  Filled 2012-04-28: qty 2

## 2012-04-28 NOTE — Progress Notes (Signed)
[facility] PROGRESS NOTE  Scott Holden XBJ:[id number] DOB: [birth year] DOA: 04/26/2012 PCP: Maryelizabeth Rowan, MD  Assessment/Plan: 1. Syncope:  - admit to telemetry - stroke work up in progress, his MRI brain did not show any acute CVA, Carotid duplex is not significant. Echo done , results pending.  - on aggrenox at home. RESUME aggrenox  2. Leukocytosis: unclear etiology. - UA and CXR ordered and neg for infection. He is afebrile.   3. Neck pain and radiculopathy: ordered CT of the cervical spine. Pain control.   4. DVT prophylaxis.  Code Status: full code Family Communication: none at bedside Disposition Plan: pending     HPI/Subjective: Comfortable. No dizziness.  Objective: Filed Vitals:   04/27/12 2103 04/27/12 2106 04/28/12 0000 04/28/12 0400  BP: 151/79 145/89 163/97 166/92  Pulse: 72 73 74 81  Temp:   98.5 F (36.9 C) 98.4 F (36.9 C)  TempSrc:      Resp:   18 18  Height:      Weight:      SpO2:   91% 96%    Intake/Output Summary (Last 24 hours) at 04/28/12 1311 Last data filed at 04/28/12 1010  Gross per 24 hour  Intake    483 ml  Output    325 ml  Net    158 ml   Filed Weights   04/27/12 0400  Weight: 137.5 kg (303 lb 2.1 oz)    Exam:   General:  Alert afebrile comfortalble  Cardiovascular: s1s2  Respiratory: CTAB  Abdomen: soft NT ND BS+  Data Reviewed: Basic Metabolic Panel:  Lab 04/27/12 3086 04/26/12 2155  NA 137 141  K 3.7 3.9  CL 104 105  CO2 26 27  GLUCOSE 90 103*  BUN 10 10  CREATININE 0.84 0.82  CALCIUM 8.6 9.2  MG -- --  PHOS -- --   Liver Function Tests:  Lab 04/26/12 2155  AST 13  ALT 10  ALKPHOS 90  BILITOT 0.3  PROT 6.3  ALBUMIN 3.2*   No results found for this basename: LIPASE:5,AMYLASE:5 in the last 168 hours No results found for this basename: AMMONIA:5 in the last 168 hours CBC:  Lab 04/27/12 0640 04/26/12 2155  WBC 12.3* 13.6*  NEUTROABS -- 8.2*  HGB 15.1 15.8  HCT 44.7 46.0  MCV 87.5  87.3  PLT 201 202   Cardiac Enzymes:  Lab 04/26/12 2155  CKTOTAL --  CKMB --  CKMBINDEX --  TROPONINI <0.30   BNP (last 3 results) No results found for this basename: PROBNP:3 in the last 8760 hours CBG: No results found for this basename: GLUCAP:5 in the last 168 hours  No results found for this or any previous visit (from the past 240 hour(s)).   Studies: Dg Chest 2 View  04/27/2012  *RADIOLOGY REPORT*  Clinical Data: Leukocytosis, hypertension.  CHEST - 2 VIEW  Comparison: 03/31/2011  Findings: Heart and mediastinal contours are within normal limits. No focal opacities or effusions.  No acute bony abnormality.  IMPRESSION: No active cardiopulmonary disease.   Original Report Authenticated By: Charlett Nose, M.D.    Dg Lumbar Spine 2-3 Views  04/26/2012  *RADIOLOGY REPORT*  Clinical Data: Dizziness.  TIA 12 hours ago lumbar pain.  LUMBAR SPINE - 2-3 VIEW  Comparison: CT abdomen and pelvis 10/25/2011  Findings: Five lumbar type vertebrae.  Diffuse degenerative changes with narrowed lumbar interspaces and endplate hypertrophic changes. Degenerative changes in the lumbar facet joints.  Normal alignment of the lumbar vertebrae.  No  vertebral compression deformities.  No focal bone lesion or bone destruction.  Bone cortex and trabecular architecture appear intact.  No significant change since previous study, allowing for technical differences.  IMPRESSION: Degenerative changes in the lumbar spine.  No displaced fractures or acute changes demonstrated.   Original Report Authenticated By: Burman Nieves, M.D.    Ct Head Wo Contrast  04/26/2012  *RADIOLOGY REPORT*  Clinical Data: Dizziness.  Left arm tingling.  Weakness in the right leg.  CT HEAD WITHOUT CONTRAST  Technique:  Contiguous axial images were obtained from the base of the skull through the vertex without contrast.  Comparison: Head CT 01/07/2009.  Findings: Well-defined focus of low attenuation in the left cerebellar hemisphere is  unchanged and compatible with an old infarction.  Mild cerebral atrophy is unchanged.  Patchy and confluent areas of decreased attenuation throughout the deep and periventricular white matter of the cerebral hemispheres bilaterally, most severe in the parietal and occipital regions, similar to the prior examination 01/07/2009, most compatible with chronic microvascular ischemic disease.  No definite acute intracranial abnormalities.  Specifically, no signs of acute intracerebral hemorrhage, no large vascular territory acute/subacute cerebral ischemia, no focal mass, mass effect, hydrocephalus or abnormal intra or extra-axial fluid collections. Visualized paranasal sinuses and mastoids are well pneumatized.  No acute displaced skull fractures are identified.  IMPRESSION: 1.  No acute intracranial abnormalities. 2.  The appearance of the brain is similar to prior study 01/07/2009, as detailed above.   Original Report Authenticated By: Trudie Reed, M.D.    Mr Brain Wo Contrast  04/27/2012  *RADIOLOGY REPORT*  Clinical Data: Syncope with dizziness.  History of hypertension and TIA.  History of pulmonary sarcoidosis.  History of hyperlipidemia and tobacco use.  MRI HEAD WITHOUT CONTRAST  Technique:  Multiplanar, multiecho pulse sequences of the brain and surrounding structures were obtained according to standard protocol without intravenous contrast.  Comparison: CT head 04/26/2012.  Findings:  Sagittal T1, along with axial T2, FLAIR, and diffusion sequences were obtained before the patient had to be removed from the scanner due to pain.  Overall the study is diagnostic.  There is no evidence for acute infarction, intracranial hemorrhage, mass lesion, hydrocephalus, or extra-axial fluid.  Slight premature atrophy is present.  Moderately extensive abnormal signal in the subcortical and periventricular white matter affects primarily the supratentorial region, but there are areas of chronic microvascular ischemic  change in the mid pons.  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  The major intracranial vascular structures appear dolichoectatic but patent.  Grossly negative orbits, sinuses, and mastoids.  No visible calvarial or skull base abnormality.  Pituitary and cerebellar tonsils are unremarkable. Small subcutaneous lipoma left frontal region.  IMPRESSION: The study was prematurely truncated, but overall the images obtained were diagnostic.  No acute stroke or visible intracranial mass lesion.  Gross patency of intracranial vasculature is established.  There is moderately extensive chronic microvascular ischemic change in the white matter, possible sequelae of longstanding hypertension.   Original Report Authenticated By: Davonna Belling, M.D.     Scheduled Meds:    . sodium chloride   Intravenous STAT  . dipyridamole-aspirin  1 capsule Oral BID  . enoxaparin (LOVENOX) injection  40 mg Subcutaneous Q24H  . [COMPLETED] influenza  inactive virus vaccine  0.5 mL Intramuscular Tomorrow-1000  . sodium chloride  3 mL Intravenous Q12H   Continuous Infusions:    . sodium chloride      Principal Problem:  *Syncope Active Problems:  HYPERLIPIDEMIA  TOBACCO ABUSE  HYPERTENSION, BENIGN ESSENTIAL  TIA        Nury Nebergall  [facility] Pager [phone]. If 8PM-8AM, please contact night-coverage at [email or web address], password [facility] 04/28/2012, 1:11 PM  LOS: 2 days

## 2012-04-29 DIAGNOSIS — R55 Syncope and collapse: Secondary | ICD-10-CM

## 2012-04-29 MED ORDER — HYDROCODONE-ACETAMINOPHEN 10-500 MG PO TABS: 1.0000 | ORAL_TABLET | Freq: Four times a day (QID) | ORAL | Status: DC | PRN

## 2012-04-29 NOTE — ED Provider Notes (Signed)
I saw and evaluated the patient, reviewed the resident's note and I agree with the findings and plan.  Hx multiple TIAs, presenting after episode of syncope.  No CP or SOB.  Associated with R sided paresthesias. Endorses lightheaded and dizziness, with "TIA" symptoms before syncope.  Glynn Octave, MD 04/29/12 [phone]

## 2012-04-29 NOTE — Progress Notes (Signed)
Pt had episode of bigemeny. Pt asymptomatic. Strip posted in chart. Will cont to monitor.

## 2012-04-29 NOTE — Care Management Note (Signed)
    Page 1 of 1   04/29/2012     11:07:08 AM   CARE MANAGEMENT NOTE 04/29/2012  Patient:  Scott Holden, Scott Holden   Account Number:  [id number]  Date Initiated:  04/29/2012  Documentation initiated by:  GRAVES-BIGELOW,Naida Escalante  Subjective/Objective Assessment:   Pt admitted with syncope- fall at home and plans for d/c today. Pt states his medicaid stopped in July and that he needs help with a new applicaiton. He has an appointment Friday with MD Alusio and is worried about payment.     Action/Plan:   Pt states he has medications at home that will last him another 30 days. CM did call Financial Counselor Morrie Sheldon to verify what the pt needs to be done with his medicaid. Will f/u.   Anticipated DC Date:  04/29/2012   Anticipated DC Plan:  HOME/SELF CARE  In-house referral  Financial Counselor      DC Planning Services  CM consult      Choice offered to / List presented to:             Status of service:  Completed, signed off Medicare Important Message given?   (If response is "NO", the following Medicare IM given date fields will be blank) Date Medicare IM given:   Date Additional Medicare IM given:    Discharge Disposition:  HOME/SELF CARE  Per UR Regulation:  Reviewed for med. necessity/level of care/duration of stay  If discussed at Long Length of Stay Meetings, dates discussed:    Comments:

## 2012-04-29 NOTE — Discharge Summary (Signed)
Physician Discharge Summary  Scott Holden WUJ:[id number] DOB: [birth year] DOA: 04/26/2012  PCP: Maryelizabeth Rowan, MD  Admit date: 04/26/2012 Discharge date: 04/29/2012  Time spent: 40 minutes  Recommendations for Outpatient Follow-up:  1. Follow up with orthopedic office as scheduled   Discharge Diagnoses:  Principal Problem:  *Syncope Active Problems:  HYPERLIPIDEMIA  TOBACCO ABUSE  HYPERTENSION, BENIGN ESSENTIAL  TIA   Discharge Condition: stable  Diet recommendation: low salt diet  Filed Weights   04/27/12 0400  Weight: 137.5 kg (303 lb 2.1 oz)    History of present illness:  Scott Holden is a 56 y.o. male who presents to the ED with complaints of having a black out in the AM in his home. He was home alone and passed out in the bathroom, and does not know how long he was passed out. He denied having any dizziness or prodrome prior to passing out. He reports having TIAs and he is being followed by his neurologist Dr. Sandria Manly for this. He reports his usual TIA is Right sided weakness along with a sensation that his tongue is thick and numb. He however, states that his opposite side was affected a few days ago during the episode, which he states is unusual.    Hospital Course:  1. Syncope:  - admit to telemetry  - stroke work up in progress, his MRI brain did not show any acute CVA, Carotid duplex is not significant. Echo done , results not significant.  - on aggrenox at home. RESUME aggrenox  2. Leukocytosis: unclear etiology.  - UA and CXR ordered and neg for infection. He is afebrile. Hence no indication of antibiotics.  3. Neck pain and radiculopathy: ordered CT of the cervical spine which showed Multilevel spondylosis C3-C7. The potential for left sided radicular symptoms exists particularly at the C5-6 and C6-7 level due to bony overgrowth. Pain control and outpatient follow up with Dr Despina Hick office.         Discharge Exam: Filed Vitals:   04/28/12 2100 04/28/12  2300 04/29/12 0400 04/29/12 0900  BP: 165/95 152/97 156/97 149/87  Pulse: 67 67 70 76  Temp: 98.1 F (36.7 C) 98.4 F (36.9 C) 98.2 F (36.8 C) 99.5 F (37.5 C)  TempSrc:    Oral  Resp: 20 18 20 20   Height:      Weight:      SpO2: 95% 95% 92% 97%    General: Alert afebrile comfortalble  Cardiovascular: s1s2  Respiratory: CTAB  Abdomen: soft NT ND BS+   Discharge Instructions  Discharge Orders    Future Orders Please Complete By Expires   Diet - low sodium heart healthy      Discharge instructions      Comments:   Follow up with Dr Despina Hick on Friday at 3 :15pm on 05/03/12. Please call their office at [phone] today to confirm the appointment.   Activity as tolerated - No restrictions          Medication List     As of 04/29/2012 10:41 AM    STOP taking these medications         lisinopril-hydrochlorothiazide 20-25 MG per tablet   Commonly known as: PRINZIDE,ZESTORETIC      oxyCODONE-acetaminophen 5-325 MG per tablet   Commonly known as: PERCOCET/ROXICET      TAKE these medications         dipyridamole-aspirin 200-25 MG per 12 hr capsule   Commonly known as: AGGRENOX   Take 1 capsule by mouth 2 (  two) times daily.      HYDROcodone-acetaminophen 10-500 MG per tablet   Commonly known as: LORTAB   Take 1 tablet by mouth every 6 (six) hours as needed for pain.      ibuprofen 200 MG tablet   Commonly known as: ADVIL,MOTRIN   Take 600-800 mg by mouth every 6 (six) hours as needed. For pain      lisinopril-hydrochlorothiazide 20-12.5 MG per tablet   Commonly known as: PRINZIDE,ZESTORETIC   Take 1 tablet by mouth 2 (two) times daily.      verapamil 80 MG tablet   Commonly known as: CALAN   Take 80 mg by mouth 3 (three) times daily.           Follow-up Information    Follow up with Loanne Drilling, MD. On 05/03/2012.   Contact information:   [address] [place] [address] [phone]            The results of significant diagnostics from this hospitalization (including imaging, microbiology, ancillary and laboratory) are listed below for reference.    Significant Diagnostic Studies: Dg Chest 2 View  04/27/2012  *RADIOLOGY REPORT*  Clinical Data: Leukocytosis, hypertension.  CHEST - 2 VIEW  Comparison: 03/31/2011  Findings: Heart and mediastinal contours are within normal limits. No focal opacities or effusions.  No acute bony abnormality.  IMPRESSION: No active cardiopulmonary disease.   Original Report Authenticated By: Charlett Nose, M.D.    Dg Lumbar Spine 2-3 Views  04/26/2012  *RADIOLOGY REPORT*  Clinical Data: Dizziness.  TIA 12 hours ago lumbar pain.  LUMBAR SPINE - 2-3 VIEW  Comparison: CT abdomen and pelvis 10/25/2011  Findings: Five lumbar type vertebrae.  Diffuse degenerative changes with narrowed lumbar interspaces and endplate hypertrophic changes. Degenerative changes in the lumbar facet joints.  Normal alignment of the lumbar vertebrae.  No vertebral compression deformities.  No focal bone lesion or bone destruction.  Bone cortex and trabecular architecture appear intact.  No significant change since previous study, allowing for technical differences.  IMPRESSION: Degenerative changes in the lumbar spine.  No displaced fractures or acute changes demonstrated.   Original Report Authenticated By: Burman Nieves, M.D.    Ct Head Wo Contrast  04/26/2012  *RADIOLOGY REPORT*  Clinical Data: Dizziness.  Left arm tingling.  Weakness in the right leg.  CT HEAD WITHOUT CONTRAST  Technique:  Contiguous axial images were obtained from the base of the skull through the vertex without contrast.  Comparison: Head CT 01/07/2009.  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  Mild cerebral atrophy is unchanged.  Patchy and confluent areas of decreased attenuation throughout the deep and periventricular  white matter of the cerebral hemispheres bilaterally, most severe in the parietal and occipital regions, similar to the prior examination 01/07/2009, most compatible with chronic microvascular ischemic disease.  No definite acute intracranial abnormalities.  Specifically, no signs of acute intracerebral hemorrhage, no large vascular territory acute/subacute cerebral ischemia, no focal mass, mass effect, hydrocephalus or abnormal intra or extra-axial fluid collections. Visualized paranasal sinuses and mastoids are well pneumatized.  No acute displaced skull fractures are identified.  IMPRESSION: 1.  No acute intracranial abnormalities. 2.  The appearance of the brain is similar to prior study 01/07/2009, as detailed above.   Original Report Authenticated By: Trudie Reed, M.D.    Ct Cervical Spine Wo Contrast  04/28/2012  *RADIOLOGY REPORT*  Clinical  Data: Onset of left shoulder pain 5 days ago.  CT CERVICAL SPINE WITHOUT CONTRAST  Technique:  Multidetector CT imaging of the cervical spine was performed. Multiplanar CT image reconstructions were also generated.  Comparison: None.  Findings: The patient had difficulty remaining motionless for the study.  Images are suboptimal.  Small or subtle lesions could be overlooked.  There is reversal of the normal cervical lordotic curve.  There is advanced disc space narrowing at C5-6 and C6-7.  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  There is no visible fracture or traumatic subluxation.  Asymmetric neural foraminal narrowing is seen at C3-4 on the right, C4-5 on the right, C5-6 on the left, and C6-7 on the left related to bony overgrowth from facet arthropathy and uncinate spurring.  Central canal stenosis is suspected at the C6-7 level with canal diameter 6-7 mm.  No definite osseous lesion.  No visible pneumothorax. Left carotid calcification.  IMPRESSION: Motion degraded examination as  described.  See comments above.  Multilevel spondylosis C3-C7.  The potential for left sided radicular symptoms exists particularly at the C5-6 and C6-7 level due to bony overgrowth.   Original Report Authenticated By: Davonna Belling, M.D.    Mr Brain Wo Contrast  04/27/2012  *RADIOLOGY REPORT*  Clinical Data: Syncope with dizziness.  History of hypertension and TIA.  History of pulmonary sarcoidosis.  History of hyperlipidemia and tobacco use.  MRI HEAD WITHOUT CONTRAST  Technique:  Multiplanar, multiecho pulse sequences of the brain and surrounding structures were obtained according to standard protocol without intravenous contrast.  Comparison: CT head 04/26/2012.  Findings:  Sagittal T1, along with axial T2, FLAIR, and diffusion sequences were obtained before the patient had to be removed from the scanner due to pain.  Overall the study is diagnostic.  There is no evidence for acute infarction, intracranial hemorrhage, mass lesion, hydrocephalus, or extra-axial fluid.  Slight premature atrophy is present.  Moderately extensive abnormal signal in the subcortical and periventricular white matter affects primarily the supratentorial region, but there are areas of chronic microvascular ischemic change in the mid pons.  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  The major intracranial vascular structures appear dolichoectatic but patent.  Grossly negative orbits, sinuses, and mastoids.  No visible calvarial or skull base abnormality.  Pituitary and cerebellar tonsils are unremarkable. Small subcutaneous lipoma left frontal region.  IMPRESSION: The study was prematurely truncated, but overall the images obtained were diagnostic.  No acute stroke or visible intracranial mass lesion.  Gross patency of intracranial vasculature is established.  There is moderately extensive chronic microvascular ischemic change in the white matter, possible sequelae of longstanding hypertension.   Original Report  Authenticated By: Davonna Belling, M.D.     Microbiology: No results found for this or any previous visit (from the past 240 hour(s)).   Labs: Basic Metabolic Panel:  Lab 04/27/12 1610 04/26/12 2155  NA 137 141  K 3.7 3.9  CL 104 105  CO2 26 27  GLUCOSE 90 103*  BUN 10 10  CREATININE 0.84 0.82  CALCIUM 8.6 9.2  MG -- --  PHOS -- --   Liver Function Tests:  Lab 04/26/12 2155  AST 13  ALT 10  ALKPHOS 90  BILITOT 0.3  PROT 6.3  ALBUMIN 3.2*   No results found for this basename: LIPASE:5,AMYLASE:5 in the last 168 hours No results found for this basename: AMMONIA:5 in the last 168 hours CBC:  Lab 04/27/12  1610 04/26/12 2155  WBC 12.3* 13.6*  NEUTROABS -- 8.2*  HGB 15.1 15.8  HCT 44.7 46.0  MCV 87.5 87.3  PLT 201 202   Cardiac Enzymes:  Lab 04/26/12 2155  CKTOTAL --  CKMB --  CKMBINDEX --  TROPONINI <0.30   BNP: BNP (last 3 results) No results found for this basename: PROBNP:3 in the last 8760 hours CBG: No results found for this basename: GLUCAP:5 in the last 168 hours     Signed:  Roshanda Balazs  [facility] 04/29/2012, 10:41 AM

## 2012-04-29 NOTE — Progress Notes (Signed)
Utilization review completed.  

## 2012-07-31 ENCOUNTER — Emergency Department (HOSPITAL_BASED_OUTPATIENT_CLINIC_OR_DEPARTMENT_OTHER): Payer: Medicaid Other

## 2012-07-31 ENCOUNTER — Emergency Department (HOSPITAL_BASED_OUTPATIENT_CLINIC_OR_DEPARTMENT_OTHER)
Admission: EM | Admit: 2012-07-31 | Discharge: 2012-07-31 | Disposition: A | Discharge status: Home / Self Care | Payer: Medicaid Other | Attending: Emergency Medicine | Admitting: Emergency Medicine

## 2012-07-31 ENCOUNTER — Encounter (HOSPITAL_BASED_OUTPATIENT_CLINIC_OR_DEPARTMENT_OTHER): Payer: Self-pay

## 2012-07-31 DIAGNOSIS — Z8739 Personal history of other diseases of the musculoskeletal system and connective tissue: Secondary | ICD-10-CM | POA: Insufficient documentation

## 2012-07-31 DIAGNOSIS — Z791 Long term (current) use of non-steroidal anti-inflammatories (NSAID): Secondary | ICD-10-CM | POA: Insufficient documentation

## 2012-07-31 DIAGNOSIS — Z79899 Other long term (current) drug therapy: Secondary | ICD-10-CM | POA: Insufficient documentation

## 2012-07-31 DIAGNOSIS — Z8673 Personal history of transient ischemic attack (TIA), and cerebral infarction without residual deficits: Secondary | ICD-10-CM | POA: Insufficient documentation

## 2012-07-31 DIAGNOSIS — R109 Unspecified abdominal pain: Secondary | ICD-10-CM | POA: Insufficient documentation

## 2012-07-31 DIAGNOSIS — I10 Essential (primary) hypertension: Secondary | ICD-10-CM | POA: Insufficient documentation

## 2012-07-31 DIAGNOSIS — Z7982 Long term (current) use of aspirin: Secondary | ICD-10-CM | POA: Insufficient documentation

## 2012-07-31 DIAGNOSIS — F17200 Nicotine dependence, unspecified, uncomplicated: Secondary | ICD-10-CM | POA: Insufficient documentation

## 2012-07-31 LAB — URINALYSIS, ROUTINE W REFLEX MICROSCOPIC
Glucose, UA: NEGATIVE mg/dL
Nitrite: NEGATIVE
Protein, ur: NEGATIVE mg/dL

## 2012-07-31 LAB — URINE MICROSCOPIC-ADD ON

## 2012-07-31 MED ORDER — ONDANSETRON 4 MG PO TBDP: 4.0000 mg | ORAL_TABLET | Freq: Three times a day (TID) | ORAL | Status: DC | PRN

## 2012-07-31 MED ORDER — HYDROMORPHONE HCL PF 1 MG/ML IJ SOLN
INTRAMUSCULAR | Status: AC
  Administered 2012-07-31: 1 mg via INTRAVENOUS
  Filled 2012-07-31: qty 1

## 2012-07-31 MED ORDER — HYDROMORPHONE HCL PF 1 MG/ML IJ SOLN
1.0000 mg | Freq: Once | INTRAMUSCULAR | Status: AC
  Administered 2012-07-31: 1 mg via INTRAVENOUS

## 2012-07-31 MED ORDER — OXYCODONE-ACETAMINOPHEN 5-325 MG PO TABS: 2.0000 | ORAL_TABLET | ORAL | Status: AC | PRN

## 2012-07-31 MED ORDER — HYDROMORPHONE HCL PF 1 MG/ML IJ SOLN
1.0000 mg | Freq: Once | INTRAMUSCULAR | Status: AC
  Administered 2012-07-31: 1 mg via INTRAVENOUS
  Filled 2012-07-31: qty 1

## 2012-07-31 MED ORDER — ONDANSETRON HCL 4 MG/2ML IJ SOLN
4.0000 mg | Freq: Once | INTRAMUSCULAR | Status: AC
  Administered 2012-07-31: 4 mg via INTRAVENOUS
  Filled 2012-07-31: qty 2

## 2012-07-31 MED ORDER — KETOROLAC TROMETHAMINE 30 MG/ML IJ SOLN
30.0000 mg | Freq: Once | INTRAMUSCULAR | Status: AC
  Administered 2012-07-31: 30 mg via INTRAVENOUS
  Filled 2012-07-31: qty 1

## 2012-07-31 MED ORDER — SODIUM CHLORIDE 0.9 % IV SOLN
Freq: Once | INTRAVENOUS | Status: AC
  Administered 2012-07-31: 19:00:00 via INTRAVENOUS

## 2012-07-31 NOTE — ED Provider Notes (Signed)
History     CSN: [id number]  Arrival date & time 07/31/12  1821   First MD Initiated Contact with Patient 07/31/12 1842      Chief Complaint  Patient presents with  . Nephrolithiasis    (Consider location/radiation/quality/duration/timing/severity/associated sxs/prior treatment) Patient is a 57 y.o. male presenting with flank pain. No language interpreter was used.  Flank Pain This is a new problem. The current episode started today. The problem occurs constantly. The problem has been gradually worsening. Associated symptoms include abdominal pain. Nothing aggravates the symptoms. He has tried nothing for the symptoms.  Pt has a history of kidney stones.  Pt reports he has had kidney stones in the past and that this feels the same  Past Medical History  Diagnosis Date  . Kidney calculi   . Hypertension   . History of TIAs   . Arthritis   . Stroke     Past Surgical History  Procedure Date  . Total hip arthroplasty   . Joint replacement   . Knee surgery   . Hernia repair 2003    Family History  Problem Relation Age of Onset  . Cancer Maternal Grandfather     prostate    History  Substance Use Topics  . Smoking status: Current Every Day Smoker    Types: Cigarettes  . Smokeless tobacco: Not on file  . Alcohol Use: No      Review of Systems  Gastrointestinal: Positive for abdominal pain.  Genitourinary: Positive for flank pain.  All other systems reviewed and are negative.    Allergies  Morphine  Home Medications   Current Outpatient Rx  Name  Route  Sig  Dispense  Refill  . ASPIRIN-DIPYRIDAMOLE ER 25-200 MG PO CP12   Oral   Take 1 capsule by mouth 2 (two) times daily.         Marland Kitchen HYDROCODONE-ACETAMINOPHEN 10-500 MG PO TABS   Oral   Take 1 tablet by mouth every 6 (six) hours as needed for pain.   30 tablet   0   . IBUPROFEN 200 MG PO TABS   Oral   Take 600-800 mg by mouth every 6 (six) hours as needed. For pain         .  LISINOPRIL-HYDROCHLOROTHIAZIDE 20-12.5 MG PO TABS   Oral   Take 1 tablet by mouth 2 (two) times daily.         Marland Kitchen VERAPAMIL HCL 80 MG PO TABS   Oral   Take 80 mg by mouth 3 (three) times daily.           BP 181/108  Pulse 99  Temp 98.3 F (36.8 C) (Oral)  Resp 20  Ht 5\' 10"  (1.778 m)  Wt 270 lb (122.471 kg)  BMI 38.74 kg/m2  SpO2 98%  Physical Exam  Nursing note and vitals reviewed. Constitutional: He appears well-developed and well-nourished.  HENT:  Head: Normocephalic.  Eyes: Pupils are equal, round, and reactive to light.  Neck: Normal range of motion. Neck supple.  Cardiovascular: Normal rate and normal heart sounds.   Pulmonary/Chest: Effort normal and breath sounds normal.  Abdominal: Soft. Bowel sounds are normal.  Genitourinary: Penis normal.       Scrotum nontender bilat,  No passes  Musculoskeletal: Normal range of motion.  Neurological: He is alert.  Skin: Skin is warm.    ED Course  Procedures (including critical care time)  Labs Reviewed  URINALYSIS, ROUTINE W REFLEX MICROSCOPIC - Abnormal; Notable for the following:  Leukocytes, UA TRACE (*)     All other components within normal limits  URINE MICROSCOPIC-ADD ON   Ct Abdomen Pelvis Wo Contrast  07/31/2012  *RADIOLOGY REPORT*  Clinical Data: Right flank pain.  Kidney stone.  CT ABDOMEN AND PELVIS WITHOUT CONTRAST  Technique:  Multidetector CT imaging of the abdomen and pelvis was performed following the standard protocol without intravenous contrast.  Comparison: CT abdomen and pelvis with contrast 10/25/2011.  Findings: The lung bases are clear without focal nodule, mass, or airspace disease.  The heart size is normal.  Coronary artery calcifications are evident.  No significant pleural or pericardial effusion is present.  The liver and spleen are within normal limits.  The stomach, duodenum, and pancreas are normal as well.  The adrenal glands are within normal limits bilaterally.  A 4 mm  nonobstructing stone is present at the lower pole of the right kidney.  No significant left sided nephrolithiasis is present.  There is some stranding about both kidneys.  There is no hydronephrosis.  The ureters are within normal limits bilaterally.  Small retroperitoneal lymph nodes are similar to the prior study.  Atherosclerotic calcifications are present without aneurysm.  The rectosigmoid colon is within normal limits.  No the remainder of the colon is unremarkable.  The appendix is visualized and normal.  The small bowel is within normal limits as well.  The urinary bladder is unremarkable.  There is no significant to adenopathy or free fluid.  Sub centimeter lymph nodes are stable.  The bone windows demonstrate a remote to pressure fracture at T11. Facet degenerative changes in the lower lumbar spine are stable.  IMPRESSION:  1.  Single 4 mm nonobstructing stone at the lower pole of the right kidney. 2.  No evidence for hydronephrosis or ureteral obstruction in either kidney. 3.  Atherosclerosis including coronary artery disease. 4.  Sub centimeter retroperitoneal lymph nodes are stable.   Original Report Authenticated By: Marin Roberts, M.D.      No diagnosis found.    MDM  Pt counseled on results,  Possibly passed stone.   Pt given rx for percocet.   I advised see Dr. Retta Diones for recheck       Elson Areas, PA 07/31/12 2020  Lonia Skinner [place], [place] 07/31/12 2033

## 2012-07-31 NOTE — ED Notes (Signed)
C/ "kidney stone"-pain to right testicle x 1.5 hrs PTA

## 2012-07-31 NOTE — ED Notes (Signed)
Pt given sprite 

## 2012-08-03 NOTE — ED Provider Notes (Signed)
Medical screening examination/treatment/procedure(s) were performed by non-physician practitioner and as supervising physician I was immediately available for consultation/collaboration.  Denecia Brunette T Eshal Propps, MD 08/03/12 1526 

## 2012-08-16 ENCOUNTER — Emergency Department (HOSPITAL_BASED_OUTPATIENT_CLINIC_OR_DEPARTMENT_OTHER): Payer: Medicaid Other

## 2012-08-16 ENCOUNTER — Emergency Department (HOSPITAL_BASED_OUTPATIENT_CLINIC_OR_DEPARTMENT_OTHER)
Admission: EM | Admit: 2012-08-16 | Discharge: 2012-08-16 | Disposition: A | Discharge status: Home / Self Care | Payer: Medicaid Other | Attending: Emergency Medicine | Admitting: Emergency Medicine

## 2012-08-16 ENCOUNTER — Encounter (HOSPITAL_BASED_OUTPATIENT_CLINIC_OR_DEPARTMENT_OTHER): Payer: Self-pay

## 2012-08-16 DIAGNOSIS — N50811 Right testicular pain: Secondary | ICD-10-CM

## 2012-08-16 DIAGNOSIS — I10 Essential (primary) hypertension: Secondary | ICD-10-CM | POA: Insufficient documentation

## 2012-08-16 DIAGNOSIS — Z8739 Personal history of other diseases of the musculoskeletal system and connective tissue: Secondary | ICD-10-CM | POA: Insufficient documentation

## 2012-08-16 DIAGNOSIS — Z79899 Other long term (current) drug therapy: Secondary | ICD-10-CM | POA: Insufficient documentation

## 2012-08-16 DIAGNOSIS — F17200 Nicotine dependence, unspecified, uncomplicated: Secondary | ICD-10-CM | POA: Insufficient documentation

## 2012-08-16 DIAGNOSIS — N200 Calculus of kidney: Secondary | ICD-10-CM | POA: Insufficient documentation

## 2012-08-16 DIAGNOSIS — N509 Disorder of male genital organs, unspecified: Secondary | ICD-10-CM | POA: Insufficient documentation

## 2012-08-16 DIAGNOSIS — R109 Unspecified abdominal pain: Secondary | ICD-10-CM | POA: Insufficient documentation

## 2012-08-16 DIAGNOSIS — E669 Obesity, unspecified: Secondary | ICD-10-CM | POA: Insufficient documentation

## 2012-08-16 DIAGNOSIS — N390 Urinary tract infection, site not specified: Secondary | ICD-10-CM | POA: Insufficient documentation

## 2012-08-16 DIAGNOSIS — Z8673 Personal history of transient ischemic attack (TIA), and cerebral infarction without residual deficits: Secondary | ICD-10-CM | POA: Insufficient documentation

## 2012-08-16 LAB — CBC WITH DIFFERENTIAL/PLATELET
Eosinophils Relative: 2 % (ref 0–5)
Hemoglobin: 16.6 g/dL (ref 13.0–17.0)
Lymphocytes Relative: 30 % (ref 12–46)
Lymphs Abs: 3.3 10*3/uL (ref 0.7–4.0)
MCH: 30.1 pg (ref 26.0–34.0)
MCV: 87 fL (ref 78.0–100.0)
Monocytes Relative: 7 % (ref 3–12)
Neutrophils Relative %: 61 % (ref 43–77)
Platelets: 241 10*3/uL (ref 150–400)
RBC: 5.52 MIL/uL (ref 4.22–5.81)
WBC: 11 10*3/uL — ABNORMAL HIGH (ref 4.0–10.5)

## 2012-08-16 LAB — URINALYSIS, ROUTINE W REFLEX MICROSCOPIC
Bilirubin Urine: NEGATIVE
Hgb urine dipstick: NEGATIVE
Specific Gravity, Urine: 1.023 (ref 1.005–1.030)
Urobilinogen, UA: 1 mg/dL (ref 0.0–1.0)
pH: 7 (ref 5.0–8.0)

## 2012-08-16 LAB — COMPREHENSIVE METABOLIC PANEL
ALT: 12 U/L (ref 0–53)
Alkaline Phosphatase: 93 U/L (ref 39–117)
BUN: 9 mg/dL (ref 6–23)
CO2: 26 mEq/L (ref 19–32)
GFR calc Af Amer: >90 mL/min (ref >90)
GFR calc non Af Amer: >90 mL/min (ref >90)
Glucose, Bld: 103 mg/dL — ABNORMAL HIGH (ref 70–99)
Potassium: 3.9 mEq/L (ref 3.5–5.1)
Sodium: 138 mEq/L (ref 135–145)

## 2012-08-16 LAB — LIPASE, BLOOD: Lipase: 24 U/L (ref 11–59)

## 2012-08-16 MED ORDER — ONDANSETRON HCL 4 MG/2ML IJ SOLN
4.0000 mg | Freq: Once | INTRAMUSCULAR | Status: AC
  Administered 2012-08-16: 4 mg via INTRAVENOUS
  Filled 2012-08-16: qty 2

## 2012-08-16 MED ORDER — KETOROLAC TROMETHAMINE 30 MG/ML IJ SOLN
30.0000 mg | Freq: Once | INTRAMUSCULAR | Status: AC
  Administered 2012-08-16: 30 mg via INTRAVENOUS
  Filled 2012-08-16: qty 1

## 2012-08-16 MED ORDER — SODIUM CHLORIDE 0.9 % IV SOLN: Freq: Once | INTRAVENOUS | Status: DC

## 2012-08-16 MED ORDER — CIPROFLOXACIN HCL 500 MG PO TABS: 500.0000 mg | ORAL_TABLET | Freq: Two times a day (BID) | ORAL | Status: DC

## 2012-08-16 MED ORDER — SODIUM CHLORIDE 0.9 % IV BOLUS (SEPSIS)
1000.0000 mL | Freq: Once | INTRAVENOUS | Status: AC
  Administered 2012-08-16: 1000 mL via INTRAVENOUS

## 2012-08-16 MED ORDER — HYDROMORPHONE HCL PF 1 MG/ML IJ SOLN
1.0000 mg | Freq: Once | INTRAMUSCULAR | Status: AC
  Administered 2012-08-16: 1 mg via INTRAVENOUS
  Filled 2012-08-16: qty 1

## 2012-08-16 NOTE — ED Provider Notes (Signed)
Assumed care from Dr. Preston Fleeting.  Patient with [place] of testicle without evidence of acute testicular abnormality.  CT repeated and no movement of intrarenal stone.  Patient placed on antibiotics and urine cultured.  Advised f/u with his urologist.  Ct Abdomen Pelvis Wo Contrast  08/16/2012  *RADIOLOGY REPORT*  Clinical Data: Right flank pain.  Right-sided testicular pain. Nausea and vomiting.  CT ABDOMEN AND PELVIS WITHOUT CONTRAST  Technique:  Multidetector CT imaging of the abdomen and pelvis was performed following the standard protocol without intravenous contrast.  Comparison: 07/31/2012.  Findings: Lung Bases: Mild dependent atelectasis on the left.  Liver:  Unenhanced CT was performed per clinician order.  Lack of IV contrast limits sensitivity and specificity, especially for evaluation of abdominal/pelvic solid viscera.  Grossly normal.  Spleen:  Normal.  Gallbladder:  Normal.  Common bile duct:  Normal.  Pancreas:  Normal.  Adrenal glands:  Normal.  Kidneys:  Bilateral nonspecific perinephric stranding is present unchanged 4 mm right inferior pole renal collecting system calculus with punctate right upper pole collecting system calculus.  No left renal calculi.  Both ureters appear within normal limits.  Stomach:  Collapsed.  No inflammatory changes.  Small bowel:  No small bowel dilation or inflammatory changes.  Colon:   Normal appendix.  Colon appears within normal limits.  Pelvic Genitourinary:  Decompressed urinary bladder.  Inferior pelvis is obscured by artifact from hip hardware on the right.  No free fluid.  Bones:  No aggressive osseous lesions. Cystic changes around the acetabular cup of the right total hip arthroplasty appears similar to 12/27/2009.  Lumbar spondylosis and facet arthrosis.  Vasculature: Atherosclerosis.  No acute abnormality.  Scarring is present in the periumbilical region compatible with prior herniorrhaphy.  IMPRESSION: 1.  No acute abnormality or interval change since the prior  CT 07/31/2012 with similar presenting complaints.  Nonobstructing right renal calculi with the largest measuring 4 mm. 2.  Periumbilical herniorrhaphy.   Original Report Authenticated By: Andreas Newport, M.D.    Ct Abdomen Pelvis Wo Contrast  07/31/2012  *RADIOLOGY REPORT*  Clinical Data: Right flank pain.  Kidney stone.  CT ABDOMEN AND PELVIS WITHOUT CONTRAST  Technique:  Multidetector CT imaging of the abdomen and pelvis was performed following the standard protocol without intravenous contrast.  Comparison: CT abdomen and pelvis with contrast 10/25/2011.  Findings: The lung bases are clear without focal nodule, mass, or airspace disease.  The heart size is normal.  Coronary artery calcifications are evident.  No significant pleural or pericardial effusion is present.  The liver and spleen are within normal limits.  The stomach, duodenum, and pancreas are normal as well.  The adrenal glands are within normal limits bilaterally.  A 4 mm nonobstructing stone is present at the lower pole of the right kidney.  No significant left sided nephrolithiasis is present.  There is some stranding about both kidneys.  There is no hydronephrosis.  The ureters are within normal limits bilaterally.  Small retroperitoneal lymph nodes are similar to the prior study.  Atherosclerotic calcifications are present without aneurysm.  The rectosigmoid colon is within normal limits.  No the remainder of the colon is unremarkable.  The appendix is visualized and normal.  The small bowel is within normal limits as well.  The urinary bladder is unremarkable.  There is no significant to adenopathy or free fluid.  Sub centimeter lymph nodes are stable.  The bone windows demonstrate a remote to pressure fracture at T11. Facet degenerative changes in the  lower lumbar spine are stable.  IMPRESSION:  1.  Single 4 mm nonobstructing stone at the lower pole of the right kidney. 2.  No evidence for hydronephrosis or ureteral obstruction in either  kidney. 3.  Atherosclerosis including coronary artery disease. 4.  Sub centimeter retroperitoneal lymph nodes are stable.   Original Report Authenticated By: Marin Roberts, M.D.    US Scrotum  08/16/2012  *RADIOLOGY REPORT*  Clinical Data: Right-sided testicular pain for 2 weeks.  ULTRASOUND OF SCROTUM  Technique:  Complete ultrasound examination of the testicles, epididymis, and other scrotal structures was performed.  Comparison:  CT 07/31/2012  Findings:  Right testis:  4.7 x 3.0 x 3.6 cm.  Normal in gray scale and color Doppler appearance.  Left testis:  4.1 x 2.8 x 3.6 cm.  Normal in gray scale and color Doppler appearance.  Right epididymis:  Multiple epididymal cysts versus spermatoceles. 3.0 x 2.8 x 2.3 cm within the right epididymal head.  1.5 x 1.3 x 1.4 cm within the epididymal head.  Left epididymis:  1.0 cm epididymal cyst versus spermatocele.  Hydrocele:  Small on the right and trace on the left.  Varicocele:  Absent  IMPRESSION:  1.  Normal appearance of the testicles. 2.  Right greater than left epididymal cyst versus spermatoceles. 3.  Small right greater left hydroceles.   Original Report Authenticated By: Jeronimo Greaves, M.D.     Results for orders placed during the hospital encounter of 08/16/12  URINALYSIS, ROUTINE W REFLEX MICROSCOPIC      Result Value Range   Color, Urine AMBER (*) YELLOW   APPearance CLEAR  CLEAR   Specific Gravity, Urine 1.023  1.005 - 1.030   pH 7.0  5.0 - 8.0   Glucose, UA NEGATIVE  NEGATIVE mg/dL   Hgb urine dipstick NEGATIVE  NEGATIVE   Bilirubin Urine NEGATIVE  NEGATIVE   Ketones, ur 15 (*) NEGATIVE mg/dL   Protein, ur NEGATIVE  NEGATIVE mg/dL   Urobilinogen, UA 1.0  0.0 - 1.0 mg/dL   Nitrite NEGATIVE  NEGATIVE   Leukocytes, UA SMALL (*) NEGATIVE  CBC WITH DIFFERENTIAL      Result Value Range   WBC 11.0 (*) 4.0 - 10.5 K/uL   RBC 5.52  4.22 - 5.81 MIL/uL   Hemoglobin 16.6  13.0 - 17.0 g/dL   HCT 16.1  09.6 - 04.5 %   MCV 87.0  78.0 - 100.0  fL   MCH 30.1  26.0 - 34.0 pg   MCHC 34.6  30.0 - 36.0 g/dL   RDW 40.9  81.1 - 91.4 %   Platelets 241  150 - 400 K/uL   Neutrophils Relative 61  43 - 77 %   Neutro Abs 6.7  1.7 - 7.7 K/uL   Lymphocytes Relative 30  12 - 46 %   Lymphs Abs 3.3  0.7 - 4.0 K/uL   Monocytes Relative 7  3 - 12 %   Monocytes Absolute 0.8  0.1 - 1.0 K/uL   Eosinophils Relative 2  0 - 5 %   Eosinophils Absolute 0.2  0.0 - 0.7 K/uL   Basophils Relative 0  0 - 1 %   Basophils Absolute 0.0  0.0 - 0.1 K/uL  COMPREHENSIVE METABOLIC PANEL      Result Value Range   Sodium 138  135 - 145 mEq/L   Potassium 3.9  3.5 - 5.1 mEq/L   Chloride 101  96 - 112 mEq/L   CO2 26  19 - 32  mEq/L   Glucose, Bld 103 (*) 70 - 99 mg/dL   BUN 9  6 - 23 mg/dL   Creatinine, Ser 6.57  0.50 - 1.35 mg/dL   Calcium 9.4  8.4 - 84.6 mg/dL   Total Protein 7.2  6.0 - 8.3 g/dL   Albumin 3.7  3.5 - 5.2 g/dL   AST 17  0 - 37 U/L   ALT 12  0 - 53 U/L   Alkaline Phosphatase 93  39 - 117 U/L   Total Bilirubin 0.3  0.3 - 1.2 mg/dL   GFR calc non Af Amer >90  >90 mL/min   GFR calc Af Amer >90  >90 mL/min  LIPASE, BLOOD      Result Value Range   Lipase 24  11 - 59 U/L  URINE MICROSCOPIC-ADD ON      Result Value Range   Squamous Epithelial / LPF RARE  RARE   WBC, UA 3-6  <3 WBC/hpf   RBC / HPF 3-6  <3 RBC/hpf   Bacteria, UA FEW (*) RARE   Urine-Other MUCOUS PRESENT       Hilario Quarry, MD 08/18/12 [phone]

## 2012-08-16 NOTE — ED Notes (Signed)
Rx x 1 for cipro in pharmacy - pt notified of where to pick up Rx

## 2012-08-16 NOTE — ED Notes (Signed)
C/o testicle pain and lower abd pain x 2 weeks-kidney stone

## 2012-08-16 NOTE — ED Provider Notes (Signed)
History     CSN: [id number]  Arrival date & time 08/16/12  1403   First MD Initiated Contact with Patient 08/16/12 1416      Chief Complaint  Patient presents with  . Testicle Pain    (Consider location/radiation/quality/duration/timing/severity/associated sxs/prior treatment) Patient is a 57 y.o. male presenting with testicular pain. The history is provided by the patient.  Testicle Pain  He has been having pain in his right testicle the last 2 weeks. Pain waxes and wanes but got significantly worse last night. It is as severe as 10/10, but current pain is 7/10. Nothing makes it better nothing makes it worse. He has had some dysuria but denies fever, chills, sweats. He has had nausea and vomiting. Pain radiates up to the right lower abdomen but not into the flank or back. Pain is similar to what he has had kidney stones in the past. He was seen in the ED 2 weeks ago and told that he had a kidney stone and he thinks that the kidney stone is what is bothering him now.  Past Medical History  Diagnosis Date  . Kidney calculi   . Hypertension   . History of TIAs   . Arthritis   . Stroke     Past Surgical History  Procedure Laterality Date  . Total hip arthroplasty    . Joint replacement    . Knee surgery    . Hernia repair  2003    Family History  Problem Relation Age of Onset  . Cancer Maternal Grandfather     prostate    History  Substance Use Topics  . Smoking status: Current Every Day Smoker    Types: Cigarettes  . Smokeless tobacco: Not on file  . Alcohol Use: No      Review of Systems  Genitourinary: Positive for testicular pain.  All other systems reviewed and are negative.    Allergies  Morphine  Home Medications   Current Outpatient Rx  Name  Route  Sig  Dispense  Refill  . dipyridamole-aspirin (AGGRENOX) 25-200 MG per 12 hr capsule   Oral   Take 1 capsule by mouth 2 (two) times daily.         Marland Kitchen HYDROcodone-acetaminophen (LORTAB 10) 10-500  MG per tablet   Oral   Take 1 tablet by mouth every 6 (six) hours as needed for pain.   30 tablet   0   . ibuprofen (ADVIL,MOTRIN) 200 MG tablet   Oral   Take 600-800 mg by mouth every 6 (six) hours as needed. For pain         . lisinopril-hydrochlorothiazide (PRINZIDE,ZESTORETIC) 20-12.5 MG per tablet   Oral   Take 1 tablet by mouth 2 (two) times daily.         . ondansetron (ZOFRAN ODT) 4 MG disintegrating tablet   Oral   Take 1 tablet (4 mg total) by mouth every 8 (eight) hours as needed for nausea.   20 tablet   0   . verapamil (CALAN) 80 MG tablet   Oral   Take 80 mg by mouth 3 (three) times daily.           BP 175/104  Pulse 83  Temp(Src) 98.5 F (36.9 C) (Oral)  Resp 20  Ht 5\' 10"  (1.778 m)  Wt 270 lb (122.471 kg)  BMI 38.74 kg/m2  SpO2 96%  Physical Exam  Nursing note and vitals reviewed.  Obese 57 year old male, who appears uncomfortable, but is  in no acute distress. Vital signs are significant for hypertension with blood pressure 175/104. Oxygen saturation is 96%, which is normal. Head is normocephalic and atraumatic. PERRLA, EOMI. Oropharynx is clear. Neck is nontender and supple without adenopathy or JVD. Back is nontender and there is no CVA tenderness. Lungs are clear without rales, wheezes, or rhonchi. Chest is nontender. Heart has regular rate and rhythm without murmur. Abdomen is soft, flat, with mild right lower quadrant tenderness. There are no masses or hepatosplenomegaly and peristalsis is hypoactive. Genitalia: Uncircumcised penis. Testes are descended bilaterally. Right testis is enlarged and very tender. No definite scrotal masses are felt. There is no inguinal adenopathy. Extremities have 1+ edema, full range of motion is present. Skin is warm and dry without rash. Neurologic: Mental status is normal, cranial nerves are intact, there are no motor or sensory deficits.  ED Course  Procedures (including critical care time)  Results  for orders placed during the hospital encounter of 08/16/12  URINALYSIS, ROUTINE W REFLEX MICROSCOPIC      Result Value Range   Color, Urine AMBER (*) YELLOW   APPearance CLEAR  CLEAR   Specific Gravity, Urine 1.023  1.005 - 1.030   pH 7.0  5.0 - 8.0   Glucose, UA NEGATIVE  NEGATIVE mg/dL   Hgb urine dipstick NEGATIVE  NEGATIVE   Bilirubin Urine NEGATIVE  NEGATIVE   Ketones, ur 15 (*) NEGATIVE mg/dL   Protein, ur NEGATIVE  NEGATIVE mg/dL   Urobilinogen, UA 1.0  0.0 - 1.0 mg/dL   Nitrite NEGATIVE  NEGATIVE   Leukocytes, UA SMALL (*) NEGATIVE  CBC WITH DIFFERENTIAL      Result Value Range   WBC 11.0 (*) 4.0 - 10.5 K/uL   RBC 5.52  4.22 - 5.81 MIL/uL   Hemoglobin 16.6  13.0 - 17.0 g/dL   HCT 16.1  09.6 - 04.5 %   MCV 87.0  78.0 - 100.0 fL   MCH 30.1  26.0 - 34.0 pg   MCHC 34.6  30.0 - 36.0 g/dL   RDW 40.9  81.1 - 91.4 %   Platelets 241  150 - 400 K/uL   Neutrophils Relative 61  43 - 77 %   Neutro Abs 6.7  1.7 - 7.7 K/uL   Lymphocytes Relative 30  12 - 46 %   Lymphs Abs 3.3  0.7 - 4.0 K/uL   Monocytes Relative 7  3 - 12 %   Monocytes Absolute 0.8  0.1 - 1.0 K/uL   Eosinophils Relative 2  0 - 5 %   Eosinophils Absolute 0.2  0.0 - 0.7 K/uL   Basophils Relative 0  0 - 1 %   Basophils Absolute 0.0  0.0 - 0.1 K/uL  COMPREHENSIVE METABOLIC PANEL      Result Value Range   Sodium 138  135 - 145 mEq/L   Potassium 3.9  3.5 - 5.1 mEq/L   Chloride 101  96 - 112 mEq/L   CO2 26  19 - 32 mEq/L   Glucose, Bld 103 (*) 70 - 99 mg/dL   BUN 9  6 - 23 mg/dL   Creatinine, Ser 7.82  0.50 - 1.35 mg/dL   Calcium 9.4  8.4 - 95.6 mg/dL   Total Protein 7.2  6.0 - 8.3 g/dL   Albumin 3.7  3.5 - 5.2 g/dL   AST 17  0 - 37 U/L   ALT 12  0 - 53 U/L   Alkaline Phosphatase 93  39 - 117 U/L  Total Bilirubin 0.3  0.3 - 1.2 mg/dL   GFR calc non Af Amer >90  >90 mL/min   GFR calc Af Amer >90  >90 mL/min  LIPASE, BLOOD      Result Value Range   Lipase 24  11 - 59 U/L  URINE MICROSCOPIC-ADD ON       Result Value Range   Squamous Epithelial / LPF RARE  RARE   WBC, UA 3-6  <3 WBC/hpf   RBC / HPF 3-6  <3 RBC/hpf   Bacteria, UA FEW (*) RARE   Urine-Other MUCOUS PRESENT       1. Pain in right testicle       MDM  Right testicular pain which is suspicious for epididymitis. He'll be sent for testicular ultrasound. Old records are reviewed and he was seen in the ED on February 5 for right testicular and flank pain and CT scan showed left nephrolithiasis with a 4 mm calculus. Clearly, the calculus in the left kidney was not responsible for his right flank and scrotal pain. His urine was basically clean at that time. Urinalysis will be repeated today. Exam today is much more consistent with epididymitis than ureterolithiasis. Ultrasound has been ordered.  Urinalysis shows few bacteria and 3-6 WBCs which could be consistent with epididymitis. He is currently getting ultrasound. Case is endorsed to Dr. Rosalia Hammers.     Dione Booze, MD 08/16/12 [phone]

## 2012-08-16 NOTE — ED Notes (Signed)
Patient transported to CT and returned 

## 2012-08-17 LAB — URINE CULTURE
Colony Count: NO GROWTH
Culture: NO GROWTH

## 2012-09-06 ENCOUNTER — Encounter (HOSPITAL_BASED_OUTPATIENT_CLINIC_OR_DEPARTMENT_OTHER): Payer: Self-pay | Admitting: Emergency Medicine

## 2012-09-06 DIAGNOSIS — Z8673 Personal history of transient ischemic attack (TIA), and cerebral infarction without residual deficits: Secondary | ICD-10-CM | POA: Insufficient documentation

## 2012-09-06 DIAGNOSIS — I10 Essential (primary) hypertension: Secondary | ICD-10-CM | POA: Insufficient documentation

## 2012-09-06 DIAGNOSIS — Z8739 Personal history of other diseases of the musculoskeletal system and connective tissue: Secondary | ICD-10-CM | POA: Insufficient documentation

## 2012-09-06 DIAGNOSIS — Z79899 Other long term (current) drug therapy: Secondary | ICD-10-CM | POA: Insufficient documentation

## 2012-09-06 DIAGNOSIS — F17200 Nicotine dependence, unspecified, uncomplicated: Secondary | ICD-10-CM | POA: Insufficient documentation

## 2012-09-06 DIAGNOSIS — R109 Unspecified abdominal pain: Secondary | ICD-10-CM | POA: Insufficient documentation

## 2012-09-06 DIAGNOSIS — N509 Disorder of male genital organs, unspecified: Secondary | ICD-10-CM | POA: Insufficient documentation

## 2012-09-06 DIAGNOSIS — Z87442 Personal history of urinary calculi: Secondary | ICD-10-CM | POA: Insufficient documentation

## 2012-09-06 LAB — URINALYSIS, ROUTINE W REFLEX MICROSCOPIC
Ketones, ur: 15 mg/dL — AB
Protein, ur: NEGATIVE mg/dL
Urobilinogen, UA: 1 mg/dL (ref 0.0–1.0)

## 2012-09-06 NOTE — ED Notes (Signed)
Pt reports that he was here a couple of weeks ago with kidney stones, started developing left flank pain several days ago and pain got significantly worse today, developed n/v, vomited twice

## 2012-09-07 ENCOUNTER — Emergency Department (HOSPITAL_BASED_OUTPATIENT_CLINIC_OR_DEPARTMENT_OTHER)
Admission: EM | Admit: 2012-09-07 | Discharge: 2012-09-07 | Disposition: A | Discharge status: Home / Self Care | Payer: Medicaid Other | Attending: Emergency Medicine | Admitting: Emergency Medicine

## 2012-09-07 DIAGNOSIS — R102 Pelvic and perineal pain: Secondary | ICD-10-CM

## 2012-09-07 LAB — URINE MICROSCOPIC-ADD ON

## 2012-09-07 MED ORDER — HYDROMORPHONE HCL PF 1 MG/ML IJ SOLN
1.0000 mg | Freq: Once | INTRAMUSCULAR | Status: AC
  Administered 2012-09-07: 1 mg via INTRAVENOUS
  Filled 2012-09-07: qty 1

## 2012-09-07 MED ORDER — METOCLOPRAMIDE HCL 10 MG PO TABS: 10.0000 mg | ORAL_TABLET | Freq: Four times a day (QID) | ORAL | Status: DC | PRN

## 2012-09-07 MED ORDER — OXYCODONE-ACETAMINOPHEN 5-325 MG PO TABS: 1.0000 | ORAL_TABLET | ORAL | Status: DC | PRN

## 2012-09-07 MED ORDER — ONDANSETRON HCL 4 MG/2ML IJ SOLN
4.0000 mg | Freq: Once | INTRAMUSCULAR | Status: AC
  Administered 2012-09-07: 4 mg via INTRAVENOUS
  Filled 2012-09-07: qty 2

## 2012-09-07 MED ORDER — KETOROLAC TROMETHAMINE 30 MG/ML IJ SOLN
30.0000 mg | Freq: Once | INTRAMUSCULAR | Status: AC
  Administered 2012-09-07: 30 mg via INTRAVENOUS
  Filled 2012-09-07: qty 1

## 2012-09-07 NOTE — ED Provider Notes (Signed)
History     CSN: [id number]  Arrival date & time 09/06/12  2322   First MD Initiated Contact with Patient 09/07/12 0200      Chief Complaint  Patient presents with  . Flank Pain    (Consider location/radiation/quality/duration/timing/severity/associated sxs/prior treatment) Patient is a 57 y.o. male presenting with flank pain. The history is provided by the patient.  Flank Pain  He is complaining of severe suprapubic pain and testicular pain. Pain is worse on the left. There is associated nausea and vomiting. Pain is as severe as 9/10. It is worse with movement. Nothing makes it better. Denies fever, chills, sweats. Denies dysuria. Denies urethral discharge. He was seen in the emergency department several weeks ago and was referred to urologist but did not followup. He was given antibiotics which he states that he completed. He denies flank pain on my interview.  Past Medical History  Diagnosis Date  . Kidney calculi   . Hypertension   . History of TIAs   . Arthritis   . Stroke     Past Surgical History  Procedure Laterality Date  . Total hip arthroplasty    . Joint replacement    . Knee surgery    . Hernia repair  2003    Family History  Problem Relation Age of Onset  . Cancer Maternal Grandfather     prostate    History  Substance Use Topics  . Smoking status: Current Every Day Smoker    Types: Cigarettes  . Smokeless tobacco: Not on file  . Alcohol Use: No      Review of Systems  Genitourinary: Positive for flank pain.  All other systems reviewed and are negative.    Allergies  Morphine  Home Medications   Current Outpatient Rx  Name  Route  Sig  Dispense  Refill  . dipyridamole-aspirin (AGGRENOX) 25-200 MG per 12 hr capsule   Oral   Take 1 capsule by mouth 2 (two) times daily.         Marland Kitchen ibuprofen (ADVIL,MOTRIN) 200 MG tablet   Oral   Take 600-800 mg by mouth every 6 (six) hours as needed. For pain         .  lisinopril-hydrochlorothiazide (PRINZIDE,ZESTORETIC) 20-12.5 MG per tablet   Oral   Take 1 tablet by mouth 2 (two) times daily.         . verapamil (CALAN) 80 MG tablet   Oral   Take 80 mg by mouth 3 (three) times daily.         . ciprofloxacin (CIPRO) 500 MG tablet   Oral   Take 1 tablet (500 mg total) by mouth every 12 (twelve) hours.   10 tablet   0   . HYDROcodone-acetaminophen (LORTAB 10) 10-500 MG per tablet   Oral   Take 1 tablet by mouth every 6 (six) hours as needed for pain.   30 tablet   0   . ondansetron (ZOFRAN ODT) 4 MG disintegrating tablet   Oral   Take 1 tablet (4 mg total) by mouth every 8 (eight) hours as needed for nausea.   20 tablet   0     BP 175/100  Pulse 97  Temp(Src) 98.6 F (37 C) (Oral)  Resp 22  SpO2 96%  Physical Exam  Nursing note and vitals reviewed.  57 year old male, resting comfortably and in no acute distress. Vital signs are significant for hypertension with blood pressure 100/75 or 100. Oxygen saturation is 96%, which  is normal. Head is normocephalic and atraumatic. PERRLA, EOMI. Oropharynx is clear. Neck is nontender and supple without adenopathy or JVD. Back is nontender and there is no CVA tenderness. Lungs are clear without rales, wheezes, or rhonchi. Chest is nontender. Heart has regular rate and rhythm without murmur. Abdomen is soft, flat, with mild to moderate suprapubic tenderness. No rebound or guarding. There are no masses or hepatosplenomegaly and peristalsis is hypoactive. Genitalia: Uncircumcised penis. Testes descended and without masses or induration and nontender. There's mild to moderate tenderness to palpation in the left inguinal canal. No hernias are palpable. Extremities have no cyanosis or edema, full range of motion is present. Skin is warm and dry without rash. Neurologic: Mental status is normal, cranial nerves are intact, there are no motor or sensory deficits.  ED Course  Procedures (including  critical care time)  Results for orders placed during the hospital encounter of 09/07/12  URINALYSIS, ROUTINE W REFLEX MICROSCOPIC      Result Value Range   Color, Urine AMBER (*) YELLOW   APPearance CLEAR  CLEAR   Specific Gravity, Urine 1.031 (*) 1.005 - 1.030   pH 5.0  5.0 - 8.0   Glucose, UA NEGATIVE  NEGATIVE mg/dL   Hgb urine dipstick TRACE (*) NEGATIVE   Bilirubin Urine SMALL (*) NEGATIVE   Ketones, ur 15 (*) NEGATIVE mg/dL   Protein, ur NEGATIVE  NEGATIVE mg/dL   Urobilinogen, UA 1.0  0.0 - 1.0 mg/dL   Nitrite NEGATIVE  NEGATIVE   Leukocytes, UA SMALL (*) NEGATIVE  URINE MICROSCOPIC-ADD ON      Result Value Range   Squamous Epithelial / LPF RARE  RARE   WBC, UA 3-6  <3 WBC/hpf   RBC / HPF 3-6  <3 RBC/hpf   Bacteria, UA FEW (*) RARE   Crystals CA OXALATE CRYSTALS (*) NEGATIVE   Urine-Other MUCOUS PRESENT         1. Pelvic pain in male       MDM  Suprapubic and testicular pain of uncertain cause. Old records are reviewed and he was seen about 3 weeks ago and had testicular ultrasound which showed small hydroceles and CT of his abdomen and pelvis which showed small intrarenal calculi. Urine culture was sterile. I am not sure the cause of his pain but I do not believe it is related to a small parenchymal renal calculi and I do not see any evidence of acute testicular pathology. Urinalysis is significant for calcium oxalate crystals and ketones. He'll be given IV fluids and additional and analgesics and referred to urology.        Dione Booze, MD 09/07/12 [phone]

## 2012-09-08 LAB — URINE CULTURE

## 2012-09-11 ENCOUNTER — Observation Stay (HOSPITAL_COMMUNITY)
Admission: EM | Admit: 2012-09-11 | Discharge: 2012-09-12 | Disposition: A | Discharge status: Home / Self Care | Payer: Medicaid Other | Attending: Family Medicine | Admitting: Family Medicine

## 2012-09-11 ENCOUNTER — Encounter (HOSPITAL_COMMUNITY): Payer: Self-pay | Admitting: Emergency Medicine

## 2012-09-11 ENCOUNTER — Emergency Department (HOSPITAL_COMMUNITY): Payer: Medicaid Other

## 2012-09-11 DIAGNOSIS — M76899 Other specified enthesopathies of unspecified lower limb, excluding foot: Secondary | ICD-10-CM

## 2012-09-11 DIAGNOSIS — R942 Abnormal results of pulmonary function studies: Secondary | ICD-10-CM

## 2012-09-11 DIAGNOSIS — M5137 Other intervertebral disc degeneration, lumbosacral region: Secondary | ICD-10-CM

## 2012-09-11 DIAGNOSIS — N200 Calculus of kidney: Secondary | ICD-10-CM

## 2012-09-11 DIAGNOSIS — L919 Hypertrophic disorder of the skin, unspecified: Secondary | ICD-10-CM

## 2012-09-11 DIAGNOSIS — J309 Allergic rhinitis, unspecified: Secondary | ICD-10-CM

## 2012-09-11 DIAGNOSIS — N1330 Unspecified hydronephrosis: Secondary | ICD-10-CM

## 2012-09-11 DIAGNOSIS — R109 Unspecified abdominal pain: Principal | ICD-10-CM | POA: Insufficient documentation

## 2012-09-11 DIAGNOSIS — Z96649 Presence of unspecified artificial hip joint: Secondary | ICD-10-CM | POA: Insufficient documentation

## 2012-09-11 DIAGNOSIS — I10 Essential (primary) hypertension: Secondary | ICD-10-CM | POA: Insufficient documentation

## 2012-09-11 DIAGNOSIS — R930 Abnormal findings on diagnostic imaging of skull and head, not elsewhere classified: Secondary | ICD-10-CM

## 2012-09-11 DIAGNOSIS — Z79899 Other long term (current) drug therapy: Secondary | ICD-10-CM | POA: Insufficient documentation

## 2012-09-11 DIAGNOSIS — M62838 Other muscle spasm: Secondary | ICD-10-CM | POA: Insufficient documentation

## 2012-09-11 DIAGNOSIS — L909 Atrophic disorder of skin, unspecified: Secondary | ICD-10-CM

## 2012-09-11 DIAGNOSIS — R110 Nausea: Secondary | ICD-10-CM | POA: Insufficient documentation

## 2012-09-11 DIAGNOSIS — F17200 Nicotine dependence, unspecified, uncomplicated: Secondary | ICD-10-CM

## 2012-09-11 DIAGNOSIS — Z87442 Personal history of urinary calculi: Secondary | ICD-10-CM

## 2012-09-11 DIAGNOSIS — R490 Dysphonia: Secondary | ICD-10-CM

## 2012-09-11 DIAGNOSIS — M25519 Pain in unspecified shoulder: Secondary | ICD-10-CM

## 2012-09-11 DIAGNOSIS — E785 Hyperlipidemia, unspecified: Secondary | ICD-10-CM

## 2012-09-11 DIAGNOSIS — M549 Dorsalgia, unspecified: Secondary | ICD-10-CM

## 2012-09-11 DIAGNOSIS — R5381 Other malaise: Secondary | ICD-10-CM | POA: Insufficient documentation

## 2012-09-11 DIAGNOSIS — D72829 Elevated white blood cell count, unspecified: Secondary | ICD-10-CM | POA: Insufficient documentation

## 2012-09-11 DIAGNOSIS — E669 Obesity, unspecified: Secondary | ICD-10-CM | POA: Insufficient documentation

## 2012-09-11 DIAGNOSIS — K219 Gastro-esophageal reflux disease without esophagitis: Secondary | ICD-10-CM

## 2012-09-11 DIAGNOSIS — E860 Dehydration: Secondary | ICD-10-CM | POA: Insufficient documentation

## 2012-09-11 DIAGNOSIS — Z8673 Personal history of transient ischemic attack (TIA), and cerebral infarction without residual deficits: Secondary | ICD-10-CM | POA: Insufficient documentation

## 2012-09-11 DIAGNOSIS — R55 Syncope and collapse: Secondary | ICD-10-CM

## 2012-09-11 DIAGNOSIS — D869 Sarcoidosis, unspecified: Secondary | ICD-10-CM

## 2012-09-11 DIAGNOSIS — G459 Transient cerebral ischemic attack, unspecified: Secondary | ICD-10-CM

## 2012-09-11 DIAGNOSIS — R599 Enlarged lymph nodes, unspecified: Secondary | ICD-10-CM

## 2012-09-11 HISTORY — DX: Dorsalgia, unspecified: M54.9

## 2012-09-11 LAB — CBC WITH DIFFERENTIAL/PLATELET
Basophils Absolute: 0.1 10*3/uL (ref 0.0–0.1)
Basophils Relative: 0 % (ref 0–1)
Eosinophils Absolute: 0.2 10*3/uL (ref 0.0–0.7)
Hemoglobin: 17.1 g/dL — ABNORMAL HIGH (ref 13.0–17.0)
MCH: 30.1 pg (ref 26.0–34.0)
MCHC: 34.6 g/dL (ref 30.0–36.0)
Monocytes Relative: 5 % (ref 3–12)
Neutrophils Relative %: 67 % (ref 43–77)
RDW: 13.5 % (ref 11.5–15.5)

## 2012-09-11 LAB — URINALYSIS, MICROSCOPIC ONLY
Leukocytes, UA: NEGATIVE
Nitrite: NEGATIVE
Specific Gravity, Urine: 1.021 (ref 1.005–1.030)
pH: 6.5 (ref 5.0–8.0)

## 2012-09-11 LAB — COMPREHENSIVE METABOLIC PANEL
Albumin: 3.6 g/dL (ref 3.5–5.2)
Alkaline Phosphatase: 79 U/L (ref 39–117)
BUN: 9 mg/dL (ref 6–23)
Creatinine, Ser: 0.86 mg/dL (ref 0.50–1.35)
Potassium: 4.7 mEq/L (ref 3.5–5.1)
Total Protein: 6.8 g/dL (ref 6.0–8.3)

## 2012-09-11 LAB — LIPASE, BLOOD: Lipase: 23 U/L (ref 11–59)

## 2012-09-11 LAB — RAPID URINE DRUG SCREEN, HOSP PERFORMED: Barbiturates: NOT DETECTED

## 2012-09-11 MED ORDER — HYDROMORPHONE HCL PF 1 MG/ML IJ SOLN
1.0000 mg | Freq: Once | INTRAMUSCULAR | Status: AC
  Administered 2012-09-11: 1 mg via INTRAVENOUS
  Filled 2012-09-11: qty 1

## 2012-09-11 MED ORDER — ASPIRIN-DIPYRIDAMOLE ER 25-200 MG PO CP12
1.0000 | ORAL_CAPSULE | Freq: Two times a day (BID) | ORAL | Status: DC
  Administered 2012-09-11 – 2012-09-12 (×2): 1 via ORAL
  Filled 2012-09-11 (×4): qty 1

## 2012-09-11 MED ORDER — ONDANSETRON HCL 4 MG/2ML IJ SOLN: 4.0000 mg | Freq: Four times a day (QID) | INTRAMUSCULAR | Status: DC | PRN

## 2012-09-11 MED ORDER — VERAPAMIL HCL 80 MG PO TABS
80.0000 mg | ORAL_TABLET | Freq: Every day | ORAL | Status: DC
  Administered 2012-09-12: 80 mg via ORAL
  Filled 2012-09-11: qty 1

## 2012-09-11 MED ORDER — IOHEXOL 300 MG/ML  SOLN
100.0000 mL | Freq: Once | INTRAMUSCULAR | Status: AC | PRN
  Administered 2012-09-11: 100 mL via INTRAVENOUS

## 2012-09-11 MED ORDER — PNEUMOCOCCAL VAC POLYVALENT 25 MCG/0.5ML IJ INJ
0.5000 mL | INJECTION | INTRAMUSCULAR | Status: AC
  Administered 2012-09-12: 0.5 mL via INTRAMUSCULAR
  Filled 2012-09-11 (×2): qty 0.5

## 2012-09-11 MED ORDER — OXYCODONE-ACETAMINOPHEN 5-325 MG PO TABS
2.0000 | ORAL_TABLET | Freq: Once | ORAL | Status: AC
  Administered 2012-09-11: 2 via ORAL
  Filled 2012-09-11: qty 2

## 2012-09-11 MED ORDER — ONDANSETRON HCL 4 MG/2ML IJ SOLN
4.0000 mg | Freq: Once | INTRAMUSCULAR | Status: AC
  Administered 2012-09-11: 4 mg via INTRAVENOUS
  Filled 2012-09-11: qty 2

## 2012-09-11 MED ORDER — ONDANSETRON HCL 4 MG PO TABS
4.0000 mg | ORAL_TABLET | Freq: Four times a day (QID) | ORAL | Status: DC | PRN
  Administered 2012-09-12: 4 mg via ORAL
  Filled 2012-09-11: qty 1

## 2012-09-11 MED ORDER — SODIUM CHLORIDE 0.9 % IV SOLN
INTRAVENOUS | Status: DC
  Administered 2012-09-11: 22:00:00 via INTRAVENOUS

## 2012-09-11 MED ORDER — METOCLOPRAMIDE HCL 10 MG PO TABS: 10.0000 mg | ORAL_TABLET | Freq: Four times a day (QID) | ORAL | Status: DC | PRN

## 2012-09-11 MED ORDER — PANTOPRAZOLE SODIUM 40 MG PO TBEC
40.0000 mg | DELAYED_RELEASE_TABLET | Freq: Every day | ORAL | Status: DC
  Administered 2012-09-11 – 2012-09-12 (×2): 40 mg via ORAL
  Filled 2012-09-11 (×2): qty 1

## 2012-09-11 MED ORDER — LISINOPRIL-HYDROCHLOROTHIAZIDE 20-25 MG PO TABS
1.0000 | ORAL_TABLET | Freq: Every day | ORAL | Status: DC
Start: 2012-09-11 — End: 2012-09-11

## 2012-09-11 MED ORDER — ENOXAPARIN SODIUM 30 MG/0.3ML ~~LOC~~ SOLN
30.0000 mg | SUBCUTANEOUS | Status: DC
  Administered 2012-09-11: 30 mg via SUBCUTANEOUS
  Filled 2012-09-11 (×3): qty 0.3

## 2012-09-11 MED ORDER — HYDROCODONE-ACETAMINOPHEN 5-325 MG PO TABS
1.0000 | ORAL_TABLET | ORAL | Status: DC | PRN
  Administered 2012-09-11 – 2012-09-12 (×3): 2 via ORAL
  Filled 2012-09-11: qty 1
  Filled 2012-09-11 (×3): qty 2

## 2012-09-11 MED ORDER — LISINOPRIL 20 MG PO TABS
20.0000 mg | ORAL_TABLET | Freq: Every day | ORAL | Status: DC
  Administered 2012-09-12: 20 mg via ORAL
  Filled 2012-09-11: qty 1

## 2012-09-11 MED ORDER — HYDROCHLOROTHIAZIDE 25 MG PO TABS
25.0000 mg | ORAL_TABLET | Freq: Every day | ORAL | Status: DC
  Administered 2012-09-12: 25 mg via ORAL
  Filled 2012-09-11: qty 1

## 2012-09-11 MED ORDER — IOHEXOL 300 MG/ML  SOLN
50.0000 mL | Freq: Once | INTRAMUSCULAR | Status: AC | PRN
  Administered 2012-09-11: 50 mL via ORAL

## 2012-09-11 NOTE — H&P (Signed)
[facility] History and Physical  TERALD JUMP ZOX:[id number] DOB: [birth year] DOA: 09/11/2012  Referring physician: ED physician PCP: Maryelizabeth Rowan, MD   Chief Complaint: Abdominal pain  HPI:  Pt is 57 yo male who presents to ED with main concern of 2 - 3 days history of right lower quadrant pain, sharp and intermittent in nature, 10/10 in severity when present and radiating to epigastric area. This has been associated with nausea and poor oral intake, no vomiting. Pt denies any specific aggravating or alleviating factors. He reports similar events in the past and has visited ED 4 times in the past month for similar problem but explains pain occurs at different sites. He denies fevers, chills, no urinary concern, no other systemic symptoms such as weight loss or night sweats.   In ED, pt with persistent pain but no vomiting, unremarkable CT abdomen and pelvis, no acute blood work abnormalities except mild leukocytosis which appears to be chronic somewhat as pt has had same on previous admission. [facility] asked to admit for observation. GI consulted in ED.  Assessment and Plan:  Abdominal pain - unclear etiology at this time as CT abd and pelvis unremarkable and on exam there is some inconsistency with the actual site of pain - no acute electrolyte abnormalities noted - will admit for observation and provide supportive care with analgesia and antiemetics as needed - discussed with Dr.Mann, I do not thinks there is a need for inpatient consult at this time - will consider in AM if pt not better but Dr. Loreta Ave recommended an outpatient work up  - CBC and BMP in AM Hemoconcentration - possibly related to dehydration - pt tolerating PO intake well - will encourage PO intake as possible as pt already received 1 L NS in ED - CBC in AM  Code Status: Full Family Communication: Pt at bedside Disposition Plan: Admit for observation to medical floor  Review of Systems:  Constitutional:  Negative for fever, chills and malaise/fatigue. Negative for diaphoresis.  HENT: Negative for hearing loss, ear pain, nosebleeds, congestion, sore throat, neck pain, tinnitus and ear discharge.   Eyes: Negative for blurred vision, double vision, photophobia, pain, discharge and redness.  Respiratory: Negative for cough, hemoptysis, sputum production, shortness of breath, wheezing and stridor.   Cardiovascular: Negative for chest pain, palpitations, orthopnea, claudication and leg swelling.  Gastrointestinal: Positive for nausea, abdominal pain. Negative for heartburn, constipation, blood in stool and melena.  Genitourinary: Negative for dysuria, urgency, frequency, hematuria and flank pain.  Musculoskeletal: Negative for myalgias, back pain, joint pain and falls.  Skin: Negative for itching and rash.  Neurological: Negative for dizziness and weakness. Negative for tingling, tremors, sensory change, speech change, focal weakness, loss of consciousness and headaches.  Endo/Heme/Allergies: Negative for environmental allergies and polydipsia. Does not bruise/bleed easily.  Psychiatric/Behavioral: Negative for suicidal ideas. The patient is not nervous/anxious.      Past Medical History  Diagnosis Date  . Kidney calculi   . Hypertension   . History of TIAs   . Arthritis   . Stroke     Past Surgical History  Procedure Laterality Date  . Total hip arthroplasty    . Joint replacement    . Knee surgery    . Hernia repair  2003    Social History:  reports that he has been smoking Cigarettes.  He has been smoking about 0.00 packs per day. He does not have any smokeless tobacco history on file. He reports that he does not drink  alcohol or use illicit drugs.  Allergies  Allergen Reactions  . Morphine Hives and Rash    Family History  Problem Relation Age of Onset  . Cancer Maternal Grandfather     prostate    Prior to Admission medications   Medication Sig Start Date End Date Taking?  Authorizing Provider  dipyridamole-aspirin (AGGRENOX) 25-200 MG per 12 hr capsule Take 1 capsule by mouth 2 (two) times daily.   Yes Historical Provider, MD  ibuprofen (ADVIL,MOTRIN) 200 MG tablet Take 600-800 mg by mouth every 6 (six) hours as needed. For pain   Yes Historical Provider, MD  lisinopril-hydrochlorothiazide (PRINZIDE,ZESTORETIC) 20-25 MG per tablet Take 1 tablet by mouth daily.   Yes Historical Provider, MD  metoCLOPramide (REGLAN) 10 MG tablet Take 1 tablet (10 mg total) by mouth every 6 (six) hours as needed (Nausea). 09/07/12  Yes Dione Booze, MD  omeprazole (PRILOSEC) 40 MG capsule Take 40 mg by mouth daily.   Yes Historical Provider, MD  oxyCODONE-acetaminophen (ROXICET) 5-325 MG per tablet Take 1 tablet by mouth every 4 (four) hours as needed for pain. 09/07/12  Yes Dione Booze, MD  verapamil (CALAN) 80 MG tablet Take 80 mg by mouth daily.    Yes Historical Provider, MD    Physical Exam: Filed Vitals:   09/11/12 1200 09/11/12 1240 09/11/12 1400 09/11/12 1541  BP: 157/95   146/93  Pulse: 109 97 86 80  Temp: 98.7 F (37.1 C)   98.3 F (36.8 C)  TempSrc: Oral   Oral  Resp: 24 18  16   SpO2: 94% 95% 93% 93%    Physical Exam  Constitutional: Appears well-developed and well-nourished. No distress.  HENT: Normocephalic. External right and left ear normal. Oropharynx is clear and moist.  Eyes: Conjunctivae and EOM are normal. PERRLA, no scleral icterus.  Neck: Normal ROM. Neck supple. No JVD. No tracheal deviation. No thyromegaly.  CVS: Regular rhythm, tachycardic, S1/S2 +, no murmurs, no gallops, no carotid bruit.  Pulmonary: Effort and breath sounds normal, no stridor, rhonchi, wheezes, rales.  Abdominal: Soft. BS +,  no distension, tenderness in right lower quadrant and left lower quadrant with voluntary guarding, no rebound Musculoskeletal: Normal range of motion. No edema and no tenderness.  Lymphadenopathy: No lymphadenopathy noted, cervical, inguinal. Neuro: Alert.  Normal reflexes, muscle tone coordination. No cranial nerve deficit. Skin: Skin is warm and dry. No rash noted. Not diaphoretic. No erythema. No pallor.  Psychiatric: Normal mood and affect. Behavior, judgment, thought content normal.   Labs on Admission:  Basic Metabolic Panel:  Recent Labs Lab 09/11/12 1235  NA 136  K 4.7  CL 101  CO2 25  GLUCOSE 113*  BUN 9  CREATININE 0.86  CALCIUM 9.6   Liver Function Tests:  Recent Labs Lab 09/11/12 1235  AST 23  ALT 11  ALKPHOS 79  BILITOT 0.4  PROT 6.8  ALBUMIN 3.6    Recent Labs Lab 09/11/12 1235  LIPASE 23   CBC:  Recent Labs Lab 09/11/12 1235  WBC 15.4*  NEUTROABS 10.3*  HGB 17.1*  HCT 49.4  MCV 87.0  PLT 238   Radiological Exams on Admission: Ct Abdomen Pelvis W Contrast 09/11/2012 1.  No definite acute findings in the abdomen or pelvis to account for the patient's symptoms.  2.  There is a nonobstructive 4 mm calculus in the lower pole collecting system of the right kidney.  No ureteral stones or findings of urinary tract obstruction at this time.  3.  Normal appendix.  4.  Atherosclerosis.  5.  Additional incidental findings, as above.     Dg Abd Acute W/chest 09/11/2012  Nonobstructive bowel gas pattern  Bibasilar atelectasis.    EKG: Normal sinus rhythm, no ST/T wave changes  Debbora Presto, MD  [facility] Pager [phone]  If 7PM-7AM, please contact night-coverage [email or web address] Password [email or web address] 09/11/2012, 4:00 PM

## 2012-09-11 NOTE — ED Notes (Signed)
PA at bedside.

## 2012-09-11 NOTE — ED Notes (Signed)
Pt to xray

## 2012-09-11 NOTE — ED Notes (Signed)
Report given to tim, rn on floor

## 2012-09-11 NOTE — ED Notes (Signed)
Pt alert and oriented x4. Respirations even and unlabored, bilateral symmetrical rise and fall of chest. Skin warm and dry. In no acute distress. Denies needs.   

## 2012-09-11 NOTE — ED Notes (Signed)
States that he has been seen for the right flank pain 3 times and states that he is getting worse. States that he was treated for an infection in his ureter. And there was a stone that was seen in the left kidney. Complaints of right flank pain and nausea today.

## 2012-09-11 NOTE — ED Notes (Signed)
First attempt to call report. rn will call back 

## 2012-09-11 NOTE — ED Provider Notes (Signed)
History     CSN: [id number]  Arrival date & time 09/11/12  1154   First MD Initiated Contact with Patient 09/11/12 1226      Chief Complaint  Patient presents with  . Abdominal Pain  . Flank Pain  . Nausea    (Consider location/radiation/quality/duration/timing/severity/associated sxs/prior treatment) HPI Scott Holden is a 57 y.o. male who presents to ED with complaint of abdominal pain. States symptoms began few weeks ago, with pain in the left testicle. Was seen in ED at that time. Diagnosed with hydrocele, and was told had a 4mm stone in right kidney, non obstructive at that time. Pt states pain was not imrpoving, and localized more to the right flank. States went back to ED last week. Hand another CT scan done which showed no changes. States felt better over the weekend, but was taking percocet for his pain. States in the last 3 days, pain is now localized to the right lower quadrant. Radiates all over the abdomen. Lose, green stools. Denies fever, chills. Pain with defication. No pain with urination. Pain in testes resolved. No hematuria. Pain worsened with sitting, movement, straining. States, "my abdomen feels swollen." admits to nausea, no vomiting.  Past Medical History  Diagnosis Date  . Kidney calculi   . Hypertension   . History of TIAs   . Arthritis   . Stroke     Past Surgical History  Procedure Laterality Date  . Total hip arthroplasty    . Joint replacement    . Knee surgery    . Hernia repair  2003    Family History  Problem Relation Age of Onset  . Cancer Maternal Grandfather     prostate    History  Substance Use Topics  . Smoking status: Current Every Day Smoker    Types: Cigarettes  . Smokeless tobacco: Not on file  . Alcohol Use: No      Review of Systems  Constitutional: Negative for fever and chills.  Respiratory: Negative.   Cardiovascular: Negative.   Gastrointestinal: Positive for abdominal pain, diarrhea and abdominal distention.  Negative for nausea, vomiting, blood in stool and rectal pain.  Genitourinary: Positive for flank pain. Negative for dysuria, urgency, frequency, hematuria, scrotal swelling, penile pain and testicular pain.  All other systems reviewed and are negative.    Allergies  Morphine  Home Medications   Current Outpatient Rx  Name  Route  Sig  Dispense  Refill  . dipyridamole-aspirin (AGGRENOX) 25-200 MG per 12 hr capsule   Oral   Take 1 capsule by mouth 2 (two) times daily.         Marland Kitchen ibuprofen (ADVIL,MOTRIN) 200 MG tablet   Oral   Take 600-800 mg by mouth every 6 (six) hours as needed. For pain         . lisinopril-hydrochlorothiazide (PRINZIDE,ZESTORETIC) 20-25 MG per tablet   Oral   Take 1 tablet by mouth daily.         . metoCLOPramide (REGLAN) 10 MG tablet   Oral   Take 1 tablet (10 mg total) by mouth every 6 (six) hours as needed (Nausea).   30 tablet   0   . omeprazole (PRILOSEC) 40 MG capsule   Oral   Take 40 mg by mouth daily.         Marland Kitchen oxyCODONE-acetaminophen (ROXICET) 5-325 MG per tablet   Oral   Take 1 tablet by mouth every 4 (four) hours as needed for pain.   15 tablet  0   . verapamil (CALAN) 80 MG tablet   Oral   Take 80 mg by mouth daily.            BP 157/95  Pulse 97  Temp(Src) 98.7 F (37.1 C) (Oral)  Resp 18  SpO2 95%  Physical Exam  Nursing note and vitals reviewed. Constitutional: He appears well-developed and well-nourished. No distress.  Neck: Neck supple.  Cardiovascular: Normal rate, regular rhythm and normal heart sounds.   Pulmonary/Chest: Effort normal and breath sounds normal. No respiratory distress. He has no wheezes. He has no rales.  Abdominal: Soft. Bowel sounds are normal. He exhibits distension. There is tenderness. There is no rebound and no guarding.  RLQ tendernes. No cva tenderness  Musculoskeletal: He exhibits no edema.  Neurological: He is alert.  Skin: Skin is warm and dry.    ED Course  Procedures  (including critical care time)  PT with right lower quadrant abdominal pain. 4th visit for the same. Labs ordered. Pain meds ordered.   Results for orders placed during the hospital encounter of 09/11/12  CBC WITH DIFFERENTIAL      Result Value Range   WBC 15.4 (*) 4.0 - 10.5 K/uL   RBC 5.68  4.22 - 5.81 MIL/uL   Hemoglobin 17.1 (*) 13.0 - 17.0 g/dL   HCT 40.9  81.1 - 91.4 %   MCV 87.0  78.0 - 100.0 fL   MCH 30.1  26.0 - 34.0 pg   MCHC 34.6  30.0 - 36.0 g/dL   RDW 78.2  95.6 - 21.3 %   Platelets 238  150 - 400 K/uL   Neutrophils Relative 67  43 - 77 %   Neutro Abs 10.3 (*) 1.7 - 7.7 K/uL   Lymphocytes Relative 26  12 - 46 %   Lymphs Abs 4.0  0.7 - 4.0 K/uL   Monocytes Relative 5  3 - 12 %   Monocytes Absolute 0.8  0.1 - 1.0 K/uL   Eosinophils Relative 1  0 - 5 %   Eosinophils Absolute 0.2  0.0 - 0.7 K/uL   Basophils Relative 0  0 - 1 %   Basophils Absolute 0.1  0.0 - 0.1 K/uL  COMPREHENSIVE METABOLIC PANEL      Result Value Range   Sodium 136  135 - 145 mEq/L   Potassium 4.7  3.5 - 5.1 mEq/L   Chloride 101  96 - 112 mEq/L   CO2 25  19 - 32 mEq/L   Glucose, Bld 113 (*) 70 - 99 mg/dL   BUN 9  6 - 23 mg/dL   Creatinine, Ser 0.86  0.50 - 1.35 mg/dL   Calcium 9.6  8.4 - 57.8 mg/dL   Total Protein 6.8  6.0 - 8.3 g/dL   Albumin 3.6  3.5 - 5.2 g/dL   AST 23  0 - 37 U/L   ALT 11  0 - 53 U/L   Alkaline Phosphatase 79  39 - 117 U/L   Total Bilirubin 0.4  0.3 - 1.2 mg/dL   GFR calc non Af Amer >90  >90 mL/min   GFR calc Af Amer >90  >90 mL/min  LIPASE, BLOOD      Result Value Range   Lipase 23  11 - 59 U/L  URINALYSIS, MICROSCOPIC ONLY      Result Value Range   Color, Urine YELLOW  YELLOW   APPearance CLOUDY (*) CLEAR   Specific Gravity, Urine 1.021  1.005 - 1.030  pH 6.5  5.0 - 8.0   Glucose, UA NEGATIVE  NEGATIVE mg/dL   Hgb urine dipstick MODERATE (*) NEGATIVE   Bilirubin Urine NEGATIVE  NEGATIVE   Ketones, ur NEGATIVE  NEGATIVE mg/dL   Protein, ur 30 (*) NEGATIVE  mg/dL   Urobilinogen, UA 0.2  0.0 - 1.0 mg/dL   Nitrite NEGATIVE  NEGATIVE   Leukocytes, UA NEGATIVE  NEGATIVE   WBC, UA 0-2  <3 WBC/hpf   RBC / HPF 3-6  <3 RBC/hpf   Bacteria, UA FEW (*) RARE   Squamous Epithelial / LPF FEW (*) RARE  URINE RAPID DRUG SCREEN (HOSP PERFORMED)      Result Value Range   Opiates NONE DETECTED  NONE DETECTED   Cocaine NONE DETECTED  NONE DETECTED   Benzodiazepines NONE DETECTED  NONE DETECTED   Amphetamines NONE DETECTED  NONE DETECTED   Tetrahydrocannabinol NONE DETECTED  NONE DETECTED   Barbiturates NONE DETECTED  NONE DETECTED  BASIC METABOLIC PANEL      Result Value Range   Sodium 136  135 - 145 mEq/L   Potassium 4.1  3.5 - 5.1 mEq/L   Chloride 100  96 - 112 mEq/L   CO2 29  19 - 32 mEq/L   Glucose, Bld 113 (*) 70 - 99 mg/dL   BUN 11  6 - 23 mg/dL   Creatinine, Ser 1.47  0.50 - 1.35 mg/dL   Calcium 9.0  8.4 - 82.9 mg/dL   GFR calc non Af Amer 74 (*) >90 mL/min   GFR calc Af Amer 85 (*) >90 mL/min  CBC      Result Value Range   WBC 9.0  4.0 - 10.5 K/uL   RBC 5.15  4.22 - 5.81 MIL/uL   Hemoglobin 15.2  13.0 - 17.0 g/dL   HCT 56.2  13.0 - 86.5 %   MCV 88.5  78.0 - 100.0 fL   MCH 29.5  26.0 - 34.0 pg   MCHC 33.3  30.0 - 36.0 g/dL   RDW 78.4  69.6 - 29.5 %   Platelets 206  150 - 400 K/uL  CG4 I-STAT (LACTIC ACID)      Result Value Range   Lactic Acid, Venous 0.95  0.5 - 2.2 mmol/L   Ct Abdomen Pelvis Wo Contrast  08/16/2012  *RADIOLOGY REPORT*  Clinical Data: Right flank pain.  Right-sided testicular pain. Nausea and vomiting.  CT ABDOMEN AND PELVIS WITHOUT CONTRAST  Technique:  Multidetector CT imaging of the abdomen and pelvis was performed following the standard protocol without intravenous contrast.  Comparison: 07/31/2012.  Findings: Lung Bases: Mild dependent atelectasis on the left.  Liver:  Unenhanced CT was performed per clinician order.  Lack of IV contrast limits sensitivity and specificity, especially for evaluation of  abdominal/pelvic solid viscera.  Grossly normal.  Spleen:  Normal.  Gallbladder:  Normal.  Common bile duct:  Normal.  Pancreas:  Normal.  Adrenal glands:  Normal.  Kidneys:  Bilateral nonspecific perinephric stranding is present unchanged 4 mm right inferior pole renal collecting system calculus with punctate right upper pole collecting system calculus.  No left renal calculi.  Both ureters appear within normal limits.  Stomach:  Collapsed.  No inflammatory changes.  Small bowel:  No small bowel dilation or inflammatory changes.  Colon:   Normal appendix.  Colon appears within normal limits.  Pelvic Genitourinary:  Decompressed urinary bladder.  Inferior pelvis is obscured by artifact from hip hardware on the right.  No free fluid.  Bones:  No aggressive osseous lesions. Cystic changes around the acetabular cup of the right total hip arthroplasty appears similar to 12/27/2009.  Lumbar spondylosis and facet arthrosis.  Vasculature: Atherosclerosis.  No acute abnormality.  Scarring is present in the periumbilical region compatible with prior herniorrhaphy.  IMPRESSION: 1.  No acute abnormality or interval change since the prior CT 07/31/2012 with similar presenting complaints.  Nonobstructing right renal calculi with the largest measuring 4 mm. 2.  Periumbilical herniorrhaphy.   Original Report Authenticated By: Andreas Newport, M.D.    US Scrotum  08/16/2012  *RADIOLOGY REPORT*  Clinical Data: Right-sided testicular pain for 2 weeks.  ULTRASOUND OF SCROTUM  Technique:  Complete ultrasound examination of the testicles, epididymis, and other scrotal structures was performed.  Comparison:  CT 07/31/2012  Findings:  Right testis:  4.7 x 3.0 x 3.6 cm.  Normal in gray scale and color Doppler appearance.  Left testis:  4.1 x 2.8 x 3.6 cm.  Normal in gray scale and color Doppler appearance.  Right epididymis:  Multiple epididymal cysts versus spermatoceles. 3.0 x 2.8 x 2.3 cm within the right epididymal head.  1.5 x 1.3 x  1.4 cm within the epididymal head.  Left epididymis:  1.0 cm epididymal cyst versus spermatocele.  Hydrocele:  Small on the right and trace on the left.  Varicocele:  Absent  IMPRESSION:  1.  Normal appearance of the testicles. 2.  Right greater than left epididymal cyst versus spermatoceles. 3.  Small right greater left hydroceles.   Original Report Authenticated By: Jeronimo Greaves, M.D.    Ct Abdomen Pelvis W Contrast  09/11/2012  *RADIOLOGY REPORT*  Clinical Data: Abdominal pain, flank pain and nausea.  CT ABDOMEN AND PELVIS WITH CONTRAST  Technique:  Multidetector CT imaging of the abdomen and pelvis was performed following the standard protocol during bolus administration of intravenous contrast.  Contrast: 50mL OMNIPAQUE IOHEXOL 300 MG/ML  SOLN, OMNIPAQUE IOHEXOL 300 MG/ML  SOLN  Comparison: CT of the abdomen and pelvis 08/16/2012.  Findings:  Lung Bases: Unremarkable.  Abdomen/Pelvis:  4 mm nonobstructive calculus in the lower pole collecting system of the right kidney.  No definite ureteral stones or findings to suggest urinary tract obstruction at this time.  The appearance of the kidneys is otherwise unremarkable in appearance. The appearance of the liver, gallbladder, pancreas, spleen and bilateral adrenal glands is unremarkable.  There is no significant volume of ascites, no pneumoperitoneum and no pathologic distension of small bowel.  No definite pathologic lymphadenopathy identified within the abdomen or pelvis.  Normal appendix.  Prostate urinary bladder are unremarkable in appearance.  Atherosclerosis throughout the abdominal and pelvic vasculature, without definite aneurysm.  Musculoskeletal: Status post right total hip arthroplasty. There are no aggressive appearing lytic or blastic lesions noted in the visualized portions of the skeleton.  IMPRESSION: 1.  No definite acute findings in the abdomen or pelvis to account for the patient's symptoms. 2.  There is a nonobstructive 4 mm calculus in  the lower pole collecting system of the right kidney.  No ureteral stones or findings of urinary tract obstruction at this time. 3.  Normal appendix. 4.  Atherosclerosis. 5.  Additional incidental findings, as above.   Original Report Authenticated By: Trudie Reed, M.D.    Dg Abd Acute W/chest  09/11/2012  *RADIOLOGY REPORT*  Clinical Data: Abdominal pain  ACUTE ABDOMEN SERIES (ABDOMEN 2 VIEW & CHEST 1 VIEW)  Comparison: 04/27/2012  Findings: Normal heart size.  Stable appearance of the cardiac  silhouette.  Bibasilar atelectasis is stable.  There are no disproportionally dilated loops of small bowel.  No free intraperitoneal gas.  Right total hip arthroplasty is noted. No acute bony deformity.  IMPRESSION: Nonobstructive bowel gas pattern  Bibasilar atelectasis.   Original Report Authenticated By: Jolaine Click, M.D.     PT with persistent pain, despite negative CT. I discussed with Dr. Loreta Ave, GI, who recommended to monitor pt over night. Discussed with [facility], will admit obs.        MDM  PT with 4th visit for the persistent right flank/right abdominal pain. No definite source despite CTs and x-rays. Today WBC slightly higher than usual. Will admit to obs to [facility]. Pain managed in ED with dilaudid and zofran IV. Pt agrees to the plan.           Lottie Mussel, PA-C 09/13/12 2345

## 2012-09-12 DIAGNOSIS — I10 Essential (primary) hypertension: Secondary | ICD-10-CM

## 2012-09-12 DIAGNOSIS — E669 Obesity, unspecified: Secondary | ICD-10-CM

## 2012-09-12 DIAGNOSIS — M62838 Other muscle spasm: Secondary | ICD-10-CM

## 2012-09-12 LAB — BASIC METABOLIC PANEL
BUN: 11 mg/dL (ref 6–23)
Chloride: 100 mEq/L (ref 96–112)
GFR calc Af Amer: 85 mL/min — ABNORMAL LOW (ref >90)
Glucose, Bld: 113 mg/dL — ABNORMAL HIGH (ref 70–99)
Potassium: 4.1 mEq/L (ref 3.5–5.1)
Sodium: 136 mEq/L (ref 135–145)

## 2012-09-12 LAB — CBC
HCT: 45.6 % (ref 39.0–52.0)
Hemoglobin: 15.2 g/dL (ref 13.0–17.0)
MCHC: 33.3 g/dL (ref 30.0–36.0)
RBC: 5.15 MIL/uL (ref 4.22–5.81)

## 2012-09-12 MED ORDER — OXYCODONE-ACETAMINOPHEN 5-325 MG PO TABS: 1.0000 | ORAL_TABLET | Freq: Four times a day (QID) | ORAL | Status: DC | PRN

## 2012-09-12 MED ORDER — HYDRALAZINE HCL 20 MG/ML IJ SOLN
5.0000 mg | Freq: Once | INTRAMUSCULAR | Status: AC
  Administered 2012-09-12: 5 mg via INTRAVENOUS
  Filled 2012-09-12: qty 1

## 2012-09-12 MED ORDER — CYCLOBENZAPRINE HCL 5 MG PO TABS: 5.0000 mg | ORAL_TABLET | Freq: Three times a day (TID) | ORAL | Status: DC | PRN

## 2012-09-12 MED ORDER — OXYCODONE-ACETAMINOPHEN 10-325 MG PO TABS: 1.0000 | ORAL_TABLET | ORAL | Status: DC | PRN

## 2012-09-12 MED ORDER — ENOXAPARIN SODIUM 60 MG/0.6ML ~~LOC~~ SOLN
60.0000 mg | SUBCUTANEOUS | Status: DC
  Filled 2012-09-12: qty 0.6

## 2012-09-12 MED ORDER — MELOXICAM 7.5 MG PO TABS: 7.5000 mg | ORAL_TABLET | Freq: Every day | ORAL | Status: DC

## 2012-09-12 MED ORDER — OXYCODONE HCL 5 MG PO TABS
5.0000 mg | ORAL_TABLET | Freq: Once | ORAL | Status: AC
  Administered 2012-09-12: 5 mg via ORAL
  Filled 2012-09-12: qty 1

## 2012-09-12 NOTE — Discharge Summary (Addendum)
Physician Discharge Summary  Scott Holden NWG:[id number] DOB: 11/10/1955 DOA: 09/11/2012  PCP: Maryelizabeth Rowan, MD  Admit date: 09/11/2012 Discharge date: 09/12/2012  Time spent: > 35 minutes  Recommendations for Outpatient Follow-up:  1. Please be sure to follow up with your primary care physician in 1-2 weeks or sooner.  Discharge Diagnoses:  Active Problems:  Abdominal wall discomfort  Discharge Condition: Stable  Diet recommendation: Low sodium diet  Filed Weights   09/11/12 1705  Weight: 122.471 kg (270 lb)    History of present illness:  57 y/o with h/o obesity and hypertension presenting with 2 days of abdominal wall discomfort which is worse on palpation.  Hospital Course:   Abdominal pain  - CT abd and pelvis unremarkable - no acute electrolyte abnormalities noted, Alk phos within normal limits. - Patient had no emesis per discussion with me this am. - Was discussed Dr.Mann, was not felt to be needed to be evaluated per emr - At this juncture pain is reproducible on palpation but patient abdomen is soft.  Given his deconditioning I suspect patient may have a musculoskeletal component.  As such will treat with NSAIDs, muscle relaxer, and percocet for breakthrough pain. - Pt to f/u with primary care physician for further evaluation  Hemoconcentration  - related to poor oral intake which has resolved. - Likely due to poor oral intake due to abdominal pain  Addendum:  muscle spasm - at this juncture most likely causing # 1 given deconditioning. Nsaids, flexeril will be prescribed  Obesity - Patient is to continue portion control with low sodium diet.  Procedures:  none  Consultations:  none  Discharge Exam: Filed Vitals:   09/11/12 2114 09/11/12 2115 09/12/12 0527 09/12/12 1020  BP: 132/80  184/97 161/88  Pulse: 66  59 81  Temp: 97.4 F (36.3 C)  97.6 F (36.4 C) 97.4 F (36.3 C)  TempSrc: Oral  Oral Oral  Resp: 18  20 20   Height:      Weight:       SpO2: 90% 96% 93% 94%    General: Pt in NAD, alert and awake Cardiovascular: RRR, No MRG Respiratory: Pain on palpation over RUQ abdominal wall, no guarding, no rebound tenderness, Obese abdomen  Discharge Instructions  Discharge Orders   Future Orders Complete By Expires     Call MD for:  extreme fatigue  As directed     Call MD for:  severe uncontrolled pain  As directed     Call MD for:  temperature >100.4  As directed     Diet - low sodium heart healthy  As directed     Discharge instructions  As directed     Comments:      Please follow up with your primary care physician in 1-2 weeks or sooner should any new concerns arise.    Increase activity slowly  As directed         Medication List    STOP taking these medications       ibuprofen 200 MG tablet  Commonly known as:  ADVIL,MOTRIN     oxyCODONE-acetaminophen 5-325 MG per tablet  Commonly known as:  ROXICET      TAKE these medications       cyclobenzaprine 5 MG tablet  Commonly known as:  FLEXERIL  Take 1 tablet (5 mg total) by mouth 3 (three) times daily as needed for muscle spasms.     dipyridamole-aspirin 200-25 MG per 12 hr capsule  Commonly known as:  AGGRENOX  Take 1 capsule by mouth 2 (two) times daily.     lisinopril-hydrochlorothiazide 20-25 MG per tablet  Commonly known as:  PRINZIDE,ZESTORETIC  Take 1 tablet by mouth daily.     meloxicam 7.5 MG tablet  Commonly known as:  MOBIC  Take 1 tablet (7.5 mg total) by mouth daily.     metoCLOPramide 10 MG tablet  Commonly known as:  REGLAN  Take 1 tablet (10 mg total) by mouth every 6 (six) hours as needed (Nausea).     omeprazole 40 MG capsule  Commonly known as:  PRILOSEC  Take 40 mg by mouth daily.     oxyCODONE-acetaminophen 10-325 MG per tablet  Commonly known as:  PERCOCET  Take 1 tablet by mouth every 4 (four) hours as needed for pain (only to be taken if pain despite after taking meloxicam and/or flexeril).     verapamil 80 MG tablet   Commonly known as:  CALAN  Take 80 mg by mouth daily.          The results of significant diagnostics from this hospitalization (including imaging, microbiology, ancillary and laboratory) are listed below for reference.    Significant Diagnostic Studies: Ct Abdomen Pelvis Wo Contrast  08/16/2012  *RADIOLOGY REPORT*  Clinical Data: Right flank pain.  Right-sided testicular pain. Nausea and vomiting.  CT ABDOMEN AND PELVIS WITHOUT CONTRAST  Technique:  Multidetector CT imaging of the abdomen and pelvis was performed following the standard protocol without intravenous contrast.  Comparison: 07/31/2012.  Findings: Lung Bases: Mild dependent atelectasis on the left.  Liver:  Unenhanced CT was performed per clinician order.  Lack of IV contrast limits sensitivity and specificity, especially for evaluation of abdominal/pelvic solid viscera.  Grossly normal.  Spleen:  Normal.  Gallbladder:  Normal.  Common bile duct:  Normal.  Pancreas:  Normal.  Adrenal glands:  Normal.  Kidneys:  Bilateral nonspecific perinephric stranding is present unchanged 4 mm right inferior pole renal collecting system calculus with punctate right upper pole collecting system calculus.  No left renal calculi.  Both ureters appear within normal limits.  Stomach:  Collapsed.  No inflammatory changes.  Small bowel:  No small bowel dilation or inflammatory changes.  Colon:   Normal appendix.  Colon appears within normal limits.  Pelvic Genitourinary:  Decompressed urinary bladder.  Inferior pelvis is obscured by artifact from hip hardware on the right.  No free fluid.  Bones:  No aggressive osseous lesions. Cystic changes around the acetabular cup of the right total hip arthroplasty appears similar to 12/27/2009.  Lumbar spondylosis and facet arthrosis.  Vasculature: Atherosclerosis.  No acute abnormality.  Scarring is present in the periumbilical region compatible with prior herniorrhaphy.  IMPRESSION: 1.  No acute abnormality or interval  change since the prior CT 07/31/2012 with similar presenting complaints.  Nonobstructing right renal calculi with the largest measuring 4 mm. 2.  Periumbilical herniorrhaphy.   Original Report Authenticated By: Andreas Newport, M.D.    US Scrotum  08/16/2012  *RADIOLOGY REPORT*  Clinical Data: Right-sided testicular pain for 2 weeks.  ULTRASOUND OF SCROTUM  Technique:  Complete ultrasound examination of the testicles, epididymis, and other scrotal structures was performed.  Comparison:  CT 07/31/2012  Findings:  Right testis:  4.7 x 3.0 x 3.6 cm.  Normal in gray scale and color Doppler appearance.  Left testis:  4.1 x 2.8 x 3.6 cm.  Normal in gray scale and color Doppler appearance.  Right epididymis:  Multiple epididymal cysts versus spermatoceles. 3.0  x 2.8 x 2.3 cm within the right epididymal head.  1.5 x 1.3 x 1.4 cm within the epididymal head.  Left epididymis:  1.0 cm epididymal cyst versus spermatocele.  Hydrocele:  Small on the right and trace on the left.  Varicocele:  Absent  IMPRESSION:  1.  Normal appearance of the testicles. 2.  Right greater than left epididymal cyst versus spermatoceles. 3.  Small right greater left hydroceles.   Original Report Authenticated By: Jeronimo Greaves, M.D.    Ct Abdomen Pelvis W Contrast  09/11/2012  *RADIOLOGY REPORT*  Clinical Data: Abdominal pain, flank pain and nausea.  CT ABDOMEN AND PELVIS WITH CONTRAST  Technique:  Multidetector CT imaging of the abdomen and pelvis was performed following the standard protocol during bolus administration of intravenous contrast.  Contrast: 50mL OMNIPAQUE IOHEXOL 300 MG/ML  SOLN, OMNIPAQUE IOHEXOL 300 MG/ML  SOLN  Comparison: CT of the abdomen and pelvis 08/16/2012.  Findings:  Lung Bases: Unremarkable.  Abdomen/Pelvis:  4 mm nonobstructive calculus in the lower pole collecting system of the right kidney.  No definite ureteral stones or findings to suggest urinary tract obstruction at this time.  The appearance of the kidneys  is otherwise unremarkable in appearance. The appearance of the liver, gallbladder, pancreas, spleen and bilateral adrenal glands is unremarkable.  There is no significant volume of ascites, no pneumoperitoneum and no pathologic distension of small bowel.  No definite pathologic lymphadenopathy identified within the abdomen or pelvis.  Normal appendix.  Prostate urinary bladder are unremarkable in appearance.  Atherosclerosis throughout the abdominal and pelvic vasculature, without definite aneurysm.  Musculoskeletal: Status post right total hip arthroplasty. There are no aggressive appearing lytic or blastic lesions noted in the visualized portions of the skeleton.  IMPRESSION: 1.  No definite acute findings in the abdomen or pelvis to account for the patient's symptoms. 2.  There is a nonobstructive 4 mm calculus in the lower pole collecting system of the right kidney.  No ureteral stones or findings of urinary tract obstruction at this time. 3.  Normal appendix. 4.  Atherosclerosis. 5.  Additional incidental findings, as above.   Original Report Authenticated By: Trudie Reed, M.D.    Dg Abd Acute W/chest  09/11/2012  *RADIOLOGY REPORT*  Clinical Data: Abdominal pain  ACUTE ABDOMEN SERIES (ABDOMEN 2 VIEW & CHEST 1 VIEW)  Comparison: 04/27/2012  Findings: Normal heart size.  Stable appearance of the cardiac silhouette.  Bibasilar atelectasis is stable.  There are no disproportionally dilated loops of small bowel.  No free intraperitoneal gas.  Right total hip arthroplasty is noted. No acute bony deformity.  IMPRESSION: Nonobstructive bowel gas pattern  Bibasilar atelectasis.   Original Report Authenticated By: Jolaine Click, M.D.     Microbiology: Recent Results (from the past 240 hour(s))  URINE CULTURE     Status: None   Collection Time    09/06/12 11:30 PM      Result Value Range Status   Specimen Description URINE, CLEAN CATCH   Final   Special Requests NONE   Final   Culture  Setup Time  09/07/2012 08:19   Final   Colony Count 3,000 COLONIES/ML   Final   Culture INSIGNIFICANT GROWTH   Final   Report Status 09/08/2012 FINAL   Final     Labs: Basic Metabolic Panel:  Recent Labs Lab 09/11/12 1235 09/12/12 0447  NA 136 136  K 4.7 4.1  CL 101 100  CO2 25 29  GLUCOSE 113* 113*  BUN 9  11  CREATININE 0.86 1.09  CALCIUM 9.6 9.0   Liver Function Tests:  Recent Labs Lab 09/11/12 1235  AST 23  ALT 11  ALKPHOS 79  BILITOT 0.4  PROT 6.8  ALBUMIN 3.6    Recent Labs Lab 09/11/12 1235  LIPASE 23   No results found for this basename: AMMONIA,  in the last 168 hours CBC:  Recent Labs Lab 09/11/12 1235 09/12/12 0447  WBC 15.4* 9.0  NEUTROABS 10.3*  --   HGB 17.1* 15.2  HCT 49.4 45.6  MCV 87.0 88.5  PLT 238 206   Cardiac Enzymes: No results found for this basename: CKTOTAL, CKMB, CKMBINDEX, TROPONINI,  in the last 168 hours BNP: BNP (last 3 results) No results found for this basename: PROBNP,  in the last 8760 hours CBG: No results found for this basename: GLUCAP,  in the last 168 hours     Signed:  Penny Pia  [facility] 09/12/2012, 12:27 PM

## 2012-09-12 NOTE — Progress Notes (Signed)
Pharmacy Brief NOTE  Lovenox adjusted from 30mg  SQ q24h to 0.5mg /kg (60mg ) SQ q24h for BMI >30 w/ CrCl > 30 (per order comments pharmacy may adjust dose)  Thanks Juliette Alcide, PharmD, BCPS.   Pager: [phone] 09/12/2012 12:23 PM

## 2012-09-14 NOTE — ED Provider Notes (Signed)
Medical screening examination/treatment/procedure(s) were performed by non-physician practitioner and as supervising physician I was immediately available for consultation/collaboration.   Traivon Morrical L Tenzin Edelman, MD 09/14/12 1551 

## 2012-10-04 ENCOUNTER — Telehealth: Payer: Self-pay | Admitting: *Deleted

## 2012-10-04 NOTE — Telephone Encounter (Signed)
[facility] is calling wanting to know if the patient can hold their Aggrenox 25-200 for 2 days. He is having a colonoscopy this Tuesday and they need to call him back today. Please advise.

## 2012-10-04 NOTE — Telephone Encounter (Signed)
Spoke to Darl Pikes the nurse over at [facility] and she is aware of taking patient off of Aggrenox for 3 days not 2.

## 2012-10-04 NOTE — Telephone Encounter (Signed)
I spoke with Dr. Pearlean Brownie who recommended being off Aggrenox for 3 days for colonscopy.

## 2012-11-14 ENCOUNTER — Ambulatory Visit: Payer: Self-pay | Admitting: Nurse Practitioner

## 2012-11-20 ENCOUNTER — Ambulatory Visit (INDEPENDENT_AMBULATORY_CARE_PROVIDER_SITE_OTHER): Payer: Medicaid Other | Admitting: Nurse Practitioner

## 2012-11-20 ENCOUNTER — Encounter: Payer: Self-pay | Admitting: Nurse Practitioner

## 2012-11-20 ENCOUNTER — Ambulatory Visit (INDEPENDENT_AMBULATORY_CARE_PROVIDER_SITE_OTHER): Payer: Medicaid Other | Admitting: Radiology

## 2012-11-20 VITALS — BP 170/90 | HR 98 | Ht 70.0 in | Wt 308.0 lb

## 2012-11-20 DIAGNOSIS — R55 Syncope and collapse: Secondary | ICD-10-CM

## 2012-11-20 DIAGNOSIS — R51 Headache: Secondary | ICD-10-CM

## 2012-11-20 DIAGNOSIS — G459 Transient cerebral ischemic attack, unspecified: Secondary | ICD-10-CM

## 2012-11-20 NOTE — Progress Notes (Signed)
HPI: Patient returns for followup after her last visit 01/16/2012. He is a previous patient of Dr. Sandria Manly. 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 He has had no loss of vision or blackouts with these episodes until Novemeber 2013 when seen in the ER.  He has approximately 1 episode monthly which he states is brief. He has had no falls in the last year, he has a chronic back condition that is treated by Dr. Ethelene Hal and pain in good control.  He had knee surgery since last seen and trying to get off Percocet.  He also has a history of headaches but has had no recent bad headaches, well-controlled on verapamil. He  has history of sarcoidosis. He also has a history of hyperlipidemia which is followed by a cardiologist at [facility]. Gets no regular exercise. Seen in the emergency room on 04/27/2012 for an episode of passing out in the bathroom at home. He has denied blackout episodes in the past, he has no idea how long he was out, he did not have dizziness or prodrome prior to passing out. His TIA episodes in the past have been right-sided numbness. He did not have EEG done. MRI of the brain did not show anything acute. Carotid duplex without significant stenosis. He did not have recurrent events while in the hospital. He continues to complain of episodes where he just doesn't feel right, and is unable to communicate with his son when this occurs. He may stare off into space for a few moments.He is now having several events per months, he has not kept a record.   ROS: Joint pain,  joint swelling,  cramps, weight gain ,   Physical Exam General: well developed, obese male  seated, in no evident distress Head: head normocephalic and atraumatic. Oropharynx benign Neck: supple with no carotid  bruits Cardiovascular: regular rate and rhythm, no murmurs  Neurologic Exam Mental Status: Awake and fully alert. Oriented to place and time. Follows all commands. Speech and language normal.    Cranial Nerves:  Pupils equal, briskly reactive to light. Extraocular movements full without nystagmus. Visual fields full to confrontation. Hearing intact and symmetric to finger snap. Facial sensation intact. Face, tongue, palate move normally and symmetrically. Neck flexion and extension normal.  Motor: Normal bulk and tone. Normal strength in all tested extremity muscles.No focal weekness Sensory.: intact to touch and pinprick and vibratory.  Coordination: Rapid alternating movements normal in all extremities. Finger-to-nose and heel-to-shin performed accurately bilaterally. Gait and Station: Arises from chair without difficulty. Stance is normal. Gait demonstrates normal stride length and balance . Able to heel, toe and tandem walk without difficulty.  Reflexes: 2+ and symmetric. Toes downgoing.     ASSESSMENT: History of recurrent right-sided numbness which has been felt to represent left brain TIA. History of migraines currently in good control. Recent episodes of starring into space unable to communicate with his environment. Questionable seizure event, happening about twice monthly.    PLAN: Discussed with Dr. Marjory Lies who patient will be assigned to since Dr. Sandria Manly has left the practice.  Will get EEG today, patient given Lamictal starter pack to take 25mg  daily for 2 weeks then 50mg  for 2 weeks then 100mg  dose. Will get cardiac event monitoring for 3 weeks duration  Continue Aggrenox, call for refills Continue verapamil for headaches Labs reviewed and MRI reviewed from hospital admission in November, 2013.  Continue to lose weight and eat healthy Followup in 6 months  [place]  Daphine Deutscher, GNP-BC APRN

## 2012-11-20 NOTE — Patient Instructions (Addendum)
Will get EEG today, patient given Lamictal starter pack to take 25mg  daily for 2 weeks then 50mg  for 2 weeks then 100mg  dose. Will get cardiac event monitoring  Continue Aggrenox, call for refills Continue verapamil for headaches Labs reviewed an MRI reviewed from hospital admission in November Continue to lose weight and eat healthy Followup in 6 months

## 2012-11-20 NOTE — Progress Notes (Signed)
I reviewed note and agree with plan.   Inmer Nix R. Jameriah Trotti, MD 11/20/2012, 4:34 PM Certified in Neurology, Neurophysiology and Neuroimaging  [facility] [address], [address] [place], [place] [address] [phone]  

## 2012-11-21 ENCOUNTER — Telehealth: Payer: Self-pay | Admitting: *Deleted

## 2012-11-21 DIAGNOSIS — R55 Syncope and collapse: Secondary | ICD-10-CM

## 2012-11-21 NOTE — Telephone Encounter (Signed)
Re did this cardiac event monitor order (as an ancillary order vx external) for test to see if comes back on referral workque).

## 2012-11-27 ENCOUNTER — Other Ambulatory Visit: Payer: Self-pay

## 2012-11-27 ENCOUNTER — Emergency Department (HOSPITAL_COMMUNITY)
Admission: EM | Admit: 2012-11-27 | Discharge: 2012-11-27 | Disposition: A | Discharge status: Home / Self Care | Payer: Medicaid Other | Attending: Emergency Medicine | Admitting: Emergency Medicine

## 2012-11-27 ENCOUNTER — Encounter (HOSPITAL_COMMUNITY): Payer: Self-pay | Admitting: Emergency Medicine

## 2012-11-27 ENCOUNTER — Emergency Department (HOSPITAL_COMMUNITY): Payer: Medicaid Other

## 2012-11-27 ENCOUNTER — Telehealth: Payer: Self-pay | Admitting: Nurse Practitioner

## 2012-11-27 DIAGNOSIS — R05 Cough: Secondary | ICD-10-CM

## 2012-11-27 DIAGNOSIS — R5381 Other malaise: Secondary | ICD-10-CM | POA: Insufficient documentation

## 2012-11-27 DIAGNOSIS — Z8669 Personal history of other diseases of the nervous system and sense organs: Secondary | ICD-10-CM | POA: Insufficient documentation

## 2012-11-27 DIAGNOSIS — R0602 Shortness of breath: Secondary | ICD-10-CM | POA: Insufficient documentation

## 2012-11-27 DIAGNOSIS — J40 Bronchitis, not specified as acute or chronic: Secondary | ICD-10-CM

## 2012-11-27 DIAGNOSIS — Z87442 Personal history of urinary calculi: Secondary | ICD-10-CM | POA: Insufficient documentation

## 2012-11-27 DIAGNOSIS — Z79899 Other long term (current) drug therapy: Secondary | ICD-10-CM | POA: Insufficient documentation

## 2012-11-27 DIAGNOSIS — R0989 Other specified symptoms and signs involving the circulatory and respiratory systems: Secondary | ICD-10-CM | POA: Insufficient documentation

## 2012-11-27 DIAGNOSIS — W57XXXA Bitten or stung by nonvenomous insect and other nonvenomous arthropods, initial encounter: Secondary | ICD-10-CM

## 2012-11-27 DIAGNOSIS — R059 Cough, unspecified: Secondary | ICD-10-CM | POA: Insufficient documentation

## 2012-11-27 DIAGNOSIS — Z8673 Personal history of transient ischemic attack (TIA), and cerebral infarction without residual deficits: Secondary | ICD-10-CM | POA: Insufficient documentation

## 2012-11-27 DIAGNOSIS — Z8679 Personal history of other diseases of the circulatory system: Secondary | ICD-10-CM | POA: Insufficient documentation

## 2012-11-27 DIAGNOSIS — Z8739 Personal history of other diseases of the musculoskeletal system and connective tissue: Secondary | ICD-10-CM | POA: Insufficient documentation

## 2012-11-27 DIAGNOSIS — I10 Essential (primary) hypertension: Secondary | ICD-10-CM | POA: Insufficient documentation

## 2012-11-27 DIAGNOSIS — F17200 Nicotine dependence, unspecified, uncomplicated: Secondary | ICD-10-CM | POA: Insufficient documentation

## 2012-11-27 LAB — URINE MICROSCOPIC-ADD ON

## 2012-11-27 LAB — CBC WITH DIFFERENTIAL/PLATELET
Eosinophils Absolute: 0.4 10*3/uL (ref 0.0–0.7)
Eosinophils Relative: 4 % (ref 0–5)
HCT: 47.1 % (ref 39.0–52.0)
Hemoglobin: 16.2 g/dL (ref 13.0–17.0)
Lymphocytes Relative: 27 % (ref 12–46)
Lymphs Abs: 3.1 10*3/uL (ref 0.7–4.0)
MCH: 29.8 pg (ref 26.0–34.0)
MCV: 86.6 fL (ref 78.0–100.0)
Monocytes Absolute: 0.7 10*3/uL (ref 0.1–1.0)
Monocytes Relative: 6 % (ref 3–12)
Platelets: 264 10*3/uL (ref 150–400)
RBC: 5.44 MIL/uL (ref 4.22–5.81)
WBC: 11.6 10*3/uL — ABNORMAL HIGH (ref 4.0–10.5)

## 2012-11-27 LAB — URINALYSIS, ROUTINE W REFLEX MICROSCOPIC
Glucose, UA: NEGATIVE mg/dL
Nitrite: NEGATIVE
Protein, ur: NEGATIVE mg/dL
Urobilinogen, UA: 1 mg/dL (ref 0.0–1.0)

## 2012-11-27 LAB — BASIC METABOLIC PANEL
BUN: 11 mg/dL (ref 6–23)
CO2: 32 mEq/L (ref 19–32)
Calcium: 9.3 mg/dL (ref 8.4–10.5)
Creatinine, Ser: 0.84 mg/dL (ref 0.50–1.35)
GFR calc non Af Amer: >90 mL/min (ref >90)
Glucose, Bld: 82 mg/dL (ref 70–99)

## 2012-11-27 MED ORDER — ASPIRIN 81 MG PO CHEW
162.0000 mg | CHEWABLE_TABLET | Freq: Once | ORAL | Status: AC
  Administered 2012-11-27: 162 mg via ORAL
  Filled 2012-11-27: qty 2

## 2012-11-27 MED ORDER — PREDNISONE 50 MG PO TABS: 50.0000 mg | ORAL_TABLET | Freq: Every day | ORAL | Status: DC

## 2012-11-27 MED ORDER — IPRATROPIUM BROMIDE 0.02 % IN SOLN
0.5000 mg | Freq: Once | RESPIRATORY_TRACT | Status: AC
  Administered 2012-11-27: 0.5 mg via RESPIRATORY_TRACT
  Filled 2012-11-27: qty 2.5

## 2012-11-27 MED ORDER — ALBUTEROL SULFATE HFA 108 (90 BASE) MCG/ACT IN AERS: 1.0000 | INHALATION_SPRAY | Freq: Four times a day (QID) | RESPIRATORY_TRACT | Status: AC | PRN

## 2012-11-27 MED ORDER — DOXYCYCLINE HYCLATE 100 MG PO TABS
200.0000 mg | ORAL_TABLET | Freq: Once | ORAL | Status: AC
  Administered 2012-11-27: 200 mg via ORAL
  Filled 2012-11-27: qty 2

## 2012-11-27 MED ORDER — ALBUTEROL SULFATE (5 MG/ML) 0.5% IN NEBU
2.5000 mg | INHALATION_SOLUTION | Freq: Once | RESPIRATORY_TRACT | Status: AC
  Administered 2012-11-27: 2.5 mg via RESPIRATORY_TRACT
  Filled 2012-11-27: qty 0.5

## 2012-11-27 MED ORDER — AZITHROMYCIN 250 MG PO TABS: 250.0000 mg | ORAL_TABLET | Freq: Every day | ORAL | Status: DC

## 2012-11-27 NOTE — ED Notes (Signed)
For 2 weeks has been weak and left shoulder pain and congestion and passing out after coughing

## 2012-11-27 NOTE — ED Provider Notes (Addendum)
History     CSN: [id number]  Arrival date & time 11/27/12  1146   First MD Initiated Contact with Patient 11/27/12 1224      Chief Complaint  Patient presents with  . Weakness  . Cough    (Consider location/radiation/quality/duration/timing/severity/associated sxs/prior treatment) HPI Comments: Pt comes in with cc of weakness and cough. Pt has hx of HTN, TIA, Arthritis. States that he started feeling weak and developed a cough 2 days ago. He has no associated chest pain, dib, fevers, chills, headaches. Pt's cough is non productive, but he feels like there is some chest congestion and some tightness when he coughs. In addition, he states that the cough is so severe, that sometimes he cant breath properly. No recent travels, no night sweats. Pt has significant smoking hx (40+ pack years).  Patient is a 57 y.o. male presenting with weakness and cough. The history is provided by the patient.  Weakness Associated symptoms include shortness of breath. Pertinent negatives include no chest pain and no abdominal pain.  Cough Associated symptoms: shortness of breath   Associated symptoms: no chest pain, no chills and no wheezing     Past Medical History  Diagnosis Date  . Kidney calculi   . Hypertension   . History of TIAs   . Arthritis   . Back pain     trouble with lumbar 2, 3, and 4 - degenerative disease per pt  . Unspecified transient cerebral ischemia   . HA (headache)   . Syncope and collapse     Past Surgical History  Procedure Laterality Date  . Total hip arthroplasty    . Joint replacement    . Knee surgery    . Hernia repair  2003    Family History  Problem Relation Age of Onset  . Cancer Maternal Grandfather     prostate  . Diabetes Mother   . Heart Problems Father     History  Substance Use Topics  . Smoking status: Current Every Day Smoker -- 0.50 packs/day for 20 years    Types: Cigarettes  . Smokeless tobacco: Never Used     Comment: three cigarettes  daily  . Alcohol Use: No      Review of Systems  Constitutional: Negative for chills, activity change and appetite change.  Respiratory: Positive for cough and shortness of breath. Negative for wheezing.   Cardiovascular: Negative for chest pain.  Gastrointestinal: Negative for abdominal pain.  Genitourinary: Negative for dysuria.  Neurological: Positive for weakness.    Allergies  Morphine  Home Medications   Current Outpatient Rx  Name  Route  Sig  Dispense  Refill  . cyclobenzaprine (FLEXERIL) 10 MG tablet   Oral   Take 10 mg by mouth 2 (two) times daily as needed for muscle spasms.         Marland Kitchen dipyridamole-aspirin (AGGRENOX) 200-25 MG per 12 hr capsule   Oral   Take 1 capsule by mouth 2 (two) times daily.         . LamoTRIgine (LAMICTAL XR) 25 & 50 & 100 MG KIT   Oral   Take by mouth. Take as directed, sample given         . lisinopril-hydrochlorothiazide (PRINZIDE,ZESTORETIC) 20-25 MG per tablet   Oral   Take 1 tablet by mouth daily.         Marland Kitchen loratadine (CLARITIN) 10 MG tablet   Oral   Take 10 mg by mouth daily.         Marland Kitchen  metoCLOPramide (REGLAN) 10 MG tablet   Oral   Take 10 mg by mouth 3 (three) times daily.         Marland Kitchen omeprazole (PRILOSEC) 40 MG capsule   Oral   Take 40 mg by mouth daily.         . traMADol (ULTRAM) 50 MG tablet   Oral   Take 50 mg by mouth every 6 (six) hours as needed for pain.           BP 148/83  Pulse 78  Temp(Src) 98 F (36.7 C) (Oral)  Resp 15  SpO2 94%  Physical Exam  Nursing note and vitals reviewed. Constitutional: He is oriented to person, place, and time. He appears well-developed.  HENT:  Head: Normocephalic and atraumatic.  Eyes: Conjunctivae and EOM are normal. Pupils are equal, round, and reactive to light.  Neck: Normal range of motion. Neck supple.  Cardiovascular: Normal rate and regular rhythm.   Pulmonary/Chest: Effort normal and breath sounds normal. No respiratory distress. He has no  wheezes.  Abdominal: Soft. Bowel sounds are normal. He exhibits no distension. There is no tenderness. There is no rebound and no guarding.  Neurological: He is alert and oriented to person, place, and time.  Skin: Skin is warm.    ED Course  Procedures (including critical care time)  Labs Reviewed  CBC WITH DIFFERENTIAL - Abnormal; Notable for the following:    WBC 11.6 (*)    All other components within normal limits  URINALYSIS, ROUTINE W REFLEX MICROSCOPIC - Abnormal; Notable for the following:    APPearance CLOUDY (*)    Leukocytes, UA SMALL (*)    All other components within normal limits  URINE MICROSCOPIC-ADD ON - Abnormal; Notable for the following:    Bacteria, UA FEW (*)    Casts HYALINE CASTS (*)    Crystals CA OXALATE CRYSTALS (*)    All other components within normal limits  URINE CULTURE  BASIC METABOLIC PANEL  TROPONIN I   Dg Chest 2 View  11/27/2012   *RADIOLOGY REPORT*  Clinical Data: Left shoulder pain  CHEST - 2 VIEW  Comparison: 09/11/12  Findings: Cardiomediastinal silhouette is stable.  No acute infiltrate or pleural effusion.  No pulmonary edema.  Mild degenerative changes lower thoracic spine.  IMPRESSION: No active disease.   Original Report Authenticated By: Natasha Mead, M.D.     No diagnosis found.    MDM   Date: 11/27/2012  Rate: 87  Rhythm: normal sinus rhythm  QRS Axis: normal  Intervals: normal  ST/T Wave abnormalities: normal  Conduction Disutrbances: none  Narrative Interpretation: unremarkable  PT comes in with cc of cough and weakness. He is being worked for TIA by Insurance account manager, who also has ordered a holter monitoring. Apparently, patient has has several episodes of confusion, and previous hx of syncopes.  He is concerned about this ongoing cough, that has been worse over the past 2-3 days. Her has extensive smoking hx. No PE, DVT hx or risk factors and ekg shows no right sided strain, or tachycardia. I dont have any PE  concerns.  Will get CXR. Cough - almost sound like pertussis. Will reeval one more time and decide if we should start him on AB.    Derwood Kaplan, MD 11/27/12 1642  4:59 PM Pt's lab results are normal. When i discussed the findings with the patient, he reported being bit by a tick 2 days ago, right chest. There is an erythematous lesion, about 4 cm  area, with central necrosis. The bite is within 72 hours, so we will give the preventative dozy dose in the ED. WE will give the AZT for bronchitis - which shall also cover for pertussis. Pt might have early COPD. He is to see his PCP next Friday.   Derwood Kaplan, MD 11/27/12 1701

## 2012-11-27 NOTE — ED Notes (Signed)
Pt also had a medication change.

## 2012-11-27 NOTE — Telephone Encounter (Signed)
Noted  

## 2012-11-27 NOTE — ED Notes (Signed)
Also pulled off tick on Monday  Has a red area  On rt breast area

## 2012-11-27 NOTE — ED Notes (Signed)
Pt states he developed a non productive cough and congestion about a week and a half ago. Pt states he took some cough medication last night. Pt also has been passing out after coughing. Pt states he removed a tick on Monday and since has developed a red and painful area under rt breast. Pt states he is scheduled to have a heart monitor put on today at [place], and has a hx of TIA's.

## 2012-11-28 LAB — URINE CULTURE
Colony Count: NO GROWTH
Culture: NO GROWTH

## 2012-11-29 NOTE — Procedures (Signed)
   [facility]  EEG (ELECTROENCEPHALOGRAM) REPORT   STUDY DATE: 11/20/12 PATIENT NAME: Scott Holden DOB: April 25, 1956 MRN: [id number]  ORDERING CLINICIAN: Joycelyn Schmid, MD   TECHNOLOGIST: Kaylyn Lim TECHNIQUE: Electroencephalogram was recorded utilizing standard 10-20 system of lead placement and reformatted into average and bipolar montages.  RECORDING TIME: 30 minutes ACTIVATION: photic stimulation  CLINICAL INFORMATION: 57 year old male with episodes of right-sided numbness, staring, passing out.  FINDINGS: Background rhythms of 11-12 hertz and 20-30 microvolts. No focal, lateralizing, epileptiform activity or seizures are seen. Patient recorded in the awake state.   IMPRESSION:  Normal EEG in the awake state.   INTERPRETING PHYSICIAN:  Suanne Marker, MD Certified in Neurology, Neurophysiology and Neuroimaging  [facility] [address] [place], [place] [address] [phone]

## 2013-01-06 ENCOUNTER — Telehealth: Payer: Self-pay | Admitting: Neurology

## 2013-01-06 DIAGNOSIS — G459 Transient cerebral ischemic attack, unspecified: Secondary | ICD-10-CM

## 2013-01-06 NOTE — Telephone Encounter (Signed)
Discussed with sleep lab. The attempt is to get entire stroke workup completed. He probably does need sleep study. Will make referral and will f/u in the office to be evaluated. Sleep lab will call the pt and set up.

## 2013-01-06 NOTE — Telephone Encounter (Signed)
Pt is asking about having a sleep study.  He has not had the cardiac event monitor.  His pcp, [facility], he has been seeing PA, recommending to have sleep study first??  If so order.  I mentioned that pcp can also order this.   No mention in ofv note that I saw.   He does have sleep apnea stating he stops breathing when asleep.

## 2013-01-07 ENCOUNTER — Other Ambulatory Visit: Payer: Self-pay

## 2013-01-07 ENCOUNTER — Encounter: Payer: Self-pay | Admitting: Neurology

## 2013-01-07 ENCOUNTER — Ambulatory Visit (INDEPENDENT_AMBULATORY_CARE_PROVIDER_SITE_OTHER): Payer: Medicaid Other | Admitting: Neurology

## 2013-01-07 VITALS — BP 161/103 | HR 85 | Temp 98.2°F | Ht 70.0 in | Wt 307.0 lb

## 2013-01-07 DIAGNOSIS — G4733 Obstructive sleep apnea (adult) (pediatric): Secondary | ICD-10-CM

## 2013-01-07 MED ORDER — LAMOTRIGINE 25 MG PO TABS: 50.0000 mg | ORAL_TABLET | Freq: Two times a day (BID) | ORAL | Status: DC

## 2013-01-07 NOTE — Progress Notes (Signed)
Subjective:    Patient ID: Scott Holden is a 57 y.o. male.  HPI  Huston Foley, MD, PhD [facility] [address] [address] [place], [place] [address]  Dear Eber Jones   I saw your patient, Scott DELORENZO, upon your kind request in my clinic today for initial consultation of His sleep disorder, in particular, concern for OSA. The patient is unaccompanied today. As you know, [name] is a very pleasant 57 y.o.-year-old Right male, with an underlying history of TIA, hypertension, syncope, arthritis, chronic back pain and recurrent headaches, who complains of daytime tiredness in the context of snoring and witnessed apneas. He was recently started on Lamictal XR by you and had a reduction in his recurrent R sided weakness. He quit smoking about 1 1/2 months ago.   His typical bedtime is reported to be around 11 or MN PM and usual wake time is around 7-8 AM. Sleep onset typically occurs within a few minutes. He reports feeling not feeling very rested upon awakening. He wakes up on an average 5-6 times in the middle of the night and has to go to the bathroom 4-5 times on a typical night. He admits to occasional morning headaches.  He reports excessive daytime somnolence (EDS) and His Epworth Sleepiness Score (ESS) is 16/24 today. He has not fallen asleep while driving. The patient has not been taking a planned nap, but does fall asleep during the day and may sleep for about 30 minutes long. He reports dreaming in a nap and denies feeling refreshed after a nap.  He has been known to snore for the past many years. Snoring is reportedly marked, and associated with choking sounds and witnessed apneas. The patient admits to a sense of choking or strangling feeling. There is report of nighttime reflux, with rare nighttime cough experienced. The patient has noted some RLS symptoms but is not known to kick while asleep or before falling asleep. He is a restless sleeper and in the  morning, the bed is quite disheveled.   He denies cataplexy, sleep paralysis, hypnagogic or hypnopompic hallucinations, or sleep attacks. He does not report any vivid dreams, nightmares, dream enactments, or parasomnias, such as sleep walking, but has occasional sleep talking. The patient has not had a sleep study or a home sleep test.  He consumes 2 caffeinated beverage per day, usually in the form of coffee in the AM.   His Past Medical History Is Significant For: Past Medical History  Diagnosis Date  . Kidney calculi   . Hypertension   . History of TIAs   . Arthritis   . Back pain     trouble with lumbar 2, 3, and 4 - degenerative disease per pt  . Unspecified transient cerebral ischemia   . HA (headache)   . Syncope and collapse     His Past Surgical History Is Significant For: Past Surgical History  Procedure Laterality Date  . Total hip arthroplasty    . Joint replacement    . Knee surgery    . Hernia repair  2003    His Family History Is Significant For: Family History  Problem Relation Age of Onset  . Cancer Maternal Grandfather     prostate  . Diabetes Mother   . Heart Problems Father     His Social History Is Significant For: History   Social History  . Marital Status: Single    Spouse Name: N/A    Number of  Children: 4  . Years of Education: 12   Occupational History  . disabled    Social History Main Topics  . Smoking status: Current Every Day Smoker -- 0.50 packs/day for 20 years    Types: Cigarettes  . Smokeless tobacco: Never Used     Comment: three cigarettes daily  . Alcohol Use: No  . Drug Use: No  . Sexually Active: None   Other Topics Concern  . None   Social History Narrative   Patient is disabled and not working.Patient is divorced and has four children. Patient drinks about three cups of coffee daily. Right handed. High school education.    His Allergies Are:  Allergies  Allergen Reactions  . Morphine Hives, Itching and Rash   :   His Current Medications Are:  Outpatient Encounter Prescriptions as of 01/07/2013  Medication Sig Dispense Refill  . albuterol (PROVENTIL HFA;VENTOLIN HFA) 108 (90 BASE) MCG/ACT inhaler Inhale 1-2 puffs into the lungs every 6 (six) hours as needed for wheezing.  1 Inhaler  0  . azithromycin (ZITHROMAX) 250 MG tablet Take 1 tablet (250 mg total) by mouth daily. Take first 2 tablets together, then 1 every day until finished.  6 tablet  0  . cyclobenzaprine (FLEXERIL) 10 MG tablet Take 10 mg by mouth 2 (two) times daily as needed for muscle spasms.      Marland Kitchen dipyridamole-aspirin (AGGRENOX) 200-25 MG per 12 hr capsule Take 1 capsule by mouth 2 (two) times daily.      . LamoTRIgine (LAMICTAL XR) 25 & 50 & 100 MG KIT Take by mouth. Take as directed, sample given      . lisinopril-hydrochlorothiazide (PRINZIDE,ZESTORETIC) 20-25 MG per tablet Take 1 tablet by mouth daily.      Marland Kitchen loratadine (CLARITIN) 10 MG tablet Take 10 mg by mouth daily.      . metoCLOPramide (REGLAN) 10 MG tablet Take 10 mg by mouth 3 (three) times daily.      Marland Kitchen omeprazole (PRILOSEC) 40 MG capsule Take 40 mg by mouth daily.      . predniSONE (DELTASONE) 50 MG tablet Take 1 tablet (50 mg total) by mouth daily.  5 tablet  0  . traMADol (ULTRAM) 50 MG tablet Take 50 mg by mouth every 6 (six) hours as needed for pain.       No facility-administered encounter medications on file as of 01/07/2013.  :  Review of Systems  Constitutional: Positive for fatigue.  Eyes: Visual disturbance: blurred vision.  Respiratory: Positive for cough, shortness of breath and wheezing.        Snoring  Cardiovascular: Positive for leg swelling.  Musculoskeletal: Positive for myalgias, joint swelling and arthralgias.  Allergic/Immunologic: Positive for environmental allergies.  Neurological: Positive for dizziness, weakness, numbness and headaches.       Restless leg  Psychiatric/Behavioral: Positive for sleep disturbance (insomnia).    Objective:   Neurologic Exam  Physical Exam Physical Examination:   Filed Vitals:   01/07/13 1144  BP: 161/103  Pulse: 85  Temp: 98.2 F (36.8 C)    General Examination: The patient is a very pleasant 57 y.o. male in no acute distress. He appears well-developed and well-nourished and adequately groomed. He is obese.   HEENT: Normocephalic, atraumatic, pupils are equal, round and reactive to light and accommodation. Funduscopic exam is normal with sharp disc margins noted. Extraocular tracking is good without limitation to gaze excursion or nystagmus noted. Normal smooth pursuit is noted. Hearing is grossly intact. Tympanic membranes  are clear bilaterally. Face is symmetric with normal facial animation and normal facial sensation. Speech is clear with no dysarthria noted. There is no hypophonia. There is no lip, neck/head, jaw or voice tremor. Neck is supple with full range of passive and active motion. There are no carotid bruits on auscultation. Oropharynx exam reveals: moderate mouth dryness, adequate dental hygiene and moderate airway crowding, due to elongated uvula and narrow airway. Mallampati is class II. Tongue protrudes centrally and palate elevates symmetrically. Tonsils are 1+. Neck size is 18.75 inches.   Chest: Clear to auscultation without wheezing, rhonchi or crackles noted.  Heart: S1+S2+0, regular and normal without murmurs, rubs or gallops noted.   Abdomen: Soft, non-tender and non-distended with normal bowel sounds appreciated on auscultation.  Extremities: There is trace pitting edema in the distal lower extremities bilaterally. Pedal pulses are intact.  Skin: Warm and dry without trophic changes noted. There are no varicose veins.  Musculoskeletal: exam reveals no obvious joint deformities, tenderness or joint swelling or erythema.   Neurologically:  Mental status: The patient is awake, alert and oriented in all 4 spheres. His memory, attention, language and knowledge are  appropriate. There is no aphasia, agnosia, apraxia or anomia. Speech is clear with normal prosody and enunciation. Thought process is linear. Mood is congruent and affect is normal.  Cranial nerves are as described above under HEENT exam. In addition, shoulder shrug is normal with equal shoulder height noted. Motor exam: Normal bulk, strength and tone is noted. There is no drift, tremor or rebound. Romberg is negative. Reflexes are 2+ throughout with the exception of an absent R ankle jerk. Fine motor skills are intact with normal finger taps, normal hand movements, normal rapid alternating patting, normal foot taps and normal foot agility.  Cerebellar testing shows no dysmetria or intention tremor on finger to nose testing. Heel to shin is unremarkable bilaterally. There is no truncal or gait ataxia.  Sensory exam is decreased to PP in the lateral aspect of his arms from the ulna down and in the lateral aspect of his R thigh.  Gait, station and balance: he stands up slowly with pain reported in his R hip and lower back. His R leg is longer and has a limp on the R.  Assessment and Plan:   In summary, ERICKSON YAMASHIRO is a very pleasant 57 y.o.-year old male with a history and physical exam concerning for obstructive sleep apnea (OSA). I had a long chat with the patient about my findings and the diagnosis, its prognosis and treatment options. We talked about medical treatments and non-pharmacological approaches. I explained in particular the risks and ramifications of untreated moderate to severe OSA, especially with respect to developing cardiovascular disease down the Road, including congestive heart failure, difficult to treat hypertension, cardiac arrhythmias, or stroke. Even type 2 diabetes has in part been linked to untreated OSA. We talked about maintaining a healthy lifestyle in general, as well as the importance of weight control. I encouraged the patient to eat healthy, exercise daily and keep well  hydrated, to keep a scheduled bedtime and wake time routine, to not skip any meals and eat healthy snacks in between meals.  I recommended the following at this time: sleep study with potential CPAP titration.  I explained the sleep test procedure to the patient and also outlined surgical and non-surgical treatment options of OSA including the use of a dental custom-made appliance, upper airway surgery such as pillar implants, radiofrequency surgery, tongue base surgery,  and UPPP. I also explained the CPAP treatment option to the patient, who indicated that he would be willing to try CPAP if the need arises. I explained the importance of being compliant with PAP treatment, not only for insurance purposes but primarily for the patient's long term health benefit. I answered all his questions today and the patient was in agreement. I would like to see him back after the sleep study is completed and encouraged him to call with any interim questions, concerns, problems or updates. He will see you back soon and call you in 1-2 months for an update on the Lamictal.

## 2013-01-07 NOTE — Patient Instructions (Addendum)
Based on your symptoms and your exam I believe you are at risk for obstructive sleep apnea or OSA, and I think we should proceed with a sleep study to determine whether you do or do not have OSA and how severe it is. If you have more than mild OSA, I want you to consider treatment with CPAP. Please remember, the risks and ramifications of moderate to severe obstructive sleep apnea or OSA are: Cardiovascular disease, including congestive heart failure, stroke, difficult to control hypertension, arrhythmias, and even type 2 diabetes has been linked to untreated OSA. Sleep apnea causes disruption of sleep and sleep deprivation in most cases, which, in turn, can cause recurrent headaches, problems with memory, mood, concentration, focus, and vigilance. Most people with untreated sleep apnea report excessive daytime sleepiness, which can affect their ability to drive. Please do not drive if you feel sleepy.  I will see you back after your sleep study to go over the test results and where to go from there. We will call you after your sleep study and to set up an appointment at the time.   Please call Darrol Angel in 1-2 months for an update on how you are doing with the generic lamictal.

## 2013-01-07 NOTE — Telephone Encounter (Signed)
Per Eber Jones, patient is to take 50mg  of Lamictal regular release BID.  He was also asked to keep a SZ diary.  Rx has been sent.

## 2013-01-08 ENCOUNTER — Telehealth: Payer: Self-pay

## 2013-01-08 NOTE — Telephone Encounter (Signed)
I called and spoke with patient. Sleep study referral made. He said they scheduled the sleep study already and he is glad it is next Tuesday and he doesn't have to wait.

## 2013-01-13 ENCOUNTER — Encounter (HOSPITAL_COMMUNITY): Payer: Self-pay | Admitting: Emergency Medicine

## 2013-01-13 ENCOUNTER — Emergency Department (HOSPITAL_COMMUNITY): Payer: Medicaid Other

## 2013-01-13 ENCOUNTER — Inpatient Hospital Stay (HOSPITAL_COMMUNITY)
Admission: EM | Admit: 2013-01-13 | Discharge: 2013-01-15 | DRG: 189 | Disposition: A | Discharge status: Home / Self Care | Payer: Medicaid Other | Attending: Internal Medicine | Admitting: Internal Medicine

## 2013-01-13 DIAGNOSIS — E669 Obesity, unspecified: Secondary | ICD-10-CM

## 2013-01-13 DIAGNOSIS — R0609 Other forms of dyspnea: Secondary | ICD-10-CM

## 2013-01-13 DIAGNOSIS — R490 Dysphonia: Secondary | ICD-10-CM

## 2013-01-13 DIAGNOSIS — K219 Gastro-esophageal reflux disease without esophagitis: Secondary | ICD-10-CM

## 2013-01-13 DIAGNOSIS — J99 Respiratory disorders in diseases classified elsewhere: Secondary | ICD-10-CM | POA: Diagnosis present

## 2013-01-13 DIAGNOSIS — E871 Hypo-osmolality and hyponatremia: Secondary | ICD-10-CM | POA: Diagnosis present

## 2013-01-13 DIAGNOSIS — R0902 Hypoxemia: Secondary | ICD-10-CM

## 2013-01-13 DIAGNOSIS — R51 Headache: Secondary | ICD-10-CM | POA: Diagnosis present

## 2013-01-13 DIAGNOSIS — M545 Other chronic pain: Secondary | ICD-10-CM | POA: Diagnosis present

## 2013-01-13 DIAGNOSIS — R942 Abnormal results of pulmonary function studies: Secondary | ICD-10-CM

## 2013-01-13 DIAGNOSIS — J309 Allergic rhinitis, unspecified: Secondary | ICD-10-CM

## 2013-01-13 DIAGNOSIS — R599 Enlarged lymph nodes, unspecified: Secondary | ICD-10-CM

## 2013-01-13 DIAGNOSIS — Z87442 Personal history of urinary calculi: Secondary | ICD-10-CM

## 2013-01-13 DIAGNOSIS — J9610 Chronic respiratory failure, unspecified whether with hypoxia or hypercapnia: Secondary | ICD-10-CM

## 2013-01-13 DIAGNOSIS — M76899 Other specified enthesopathies of unspecified lower limb, excluding foot: Secondary | ICD-10-CM

## 2013-01-13 DIAGNOSIS — J441 Chronic obstructive pulmonary disease with (acute) exacerbation: Secondary | ICD-10-CM

## 2013-01-13 DIAGNOSIS — R55 Syncope and collapse: Secondary | ICD-10-CM

## 2013-01-13 DIAGNOSIS — G4733 Obstructive sleep apnea (adult) (pediatric): Secondary | ICD-10-CM | POA: Diagnosis present

## 2013-01-13 DIAGNOSIS — E662 Morbid (severe) obesity with alveolar hypoventilation: Secondary | ICD-10-CM | POA: Diagnosis present

## 2013-01-13 DIAGNOSIS — L909 Atrophic disorder of skin, unspecified: Secondary | ICD-10-CM

## 2013-01-13 DIAGNOSIS — Z6841 Body Mass Index (BMI) 40.0 and over, adult: Secondary | ICD-10-CM

## 2013-01-13 DIAGNOSIS — N419 Inflammatory disease of prostate, unspecified: Secondary | ICD-10-CM | POA: Diagnosis present

## 2013-01-13 DIAGNOSIS — N200 Calculus of kidney: Secondary | ICD-10-CM

## 2013-01-13 DIAGNOSIS — R0600 Dyspnea, unspecified: Secondary | ICD-10-CM | POA: Diagnosis present

## 2013-01-13 DIAGNOSIS — M5137 Other intervertebral disc degeneration, lumbosacral region: Secondary | ICD-10-CM

## 2013-01-13 DIAGNOSIS — N1330 Unspecified hydronephrosis: Secondary | ICD-10-CM

## 2013-01-13 DIAGNOSIS — J42 Unspecified chronic bronchitis: Secondary | ICD-10-CM | POA: Diagnosis present

## 2013-01-13 DIAGNOSIS — J9620 Acute and chronic respiratory failure, unspecified whether with hypoxia or hypercapnia: Principal | ICD-10-CM | POA: Diagnosis present

## 2013-01-13 DIAGNOSIS — G4730 Sleep apnea, unspecified: Secondary | ICD-10-CM | POA: Diagnosis present

## 2013-01-13 DIAGNOSIS — I10 Essential (primary) hypertension: Secondary | ICD-10-CM | POA: Diagnosis present

## 2013-01-13 DIAGNOSIS — G8929 Other chronic pain: Secondary | ICD-10-CM | POA: Diagnosis present

## 2013-01-13 DIAGNOSIS — R930 Abnormal findings on diagnostic imaging of skull and head, not elsewhere classified: Secondary | ICD-10-CM

## 2013-01-13 DIAGNOSIS — Z8673 Personal history of transient ischemic attack (TIA), and cerebral infarction without residual deficits: Secondary | ICD-10-CM

## 2013-01-13 DIAGNOSIS — M62838 Other muscle spasm: Secondary | ICD-10-CM

## 2013-01-13 DIAGNOSIS — D869 Sarcoidosis, unspecified: Secondary | ICD-10-CM | POA: Diagnosis present

## 2013-01-13 DIAGNOSIS — M549 Dorsalgia, unspecified: Secondary | ICD-10-CM

## 2013-01-13 DIAGNOSIS — N390 Urinary tract infection, site not specified: Secondary | ICD-10-CM | POA: Diagnosis present

## 2013-01-13 DIAGNOSIS — F17200 Nicotine dependence, unspecified, uncomplicated: Secondary | ICD-10-CM

## 2013-01-13 DIAGNOSIS — G459 Transient cerebral ischemic attack, unspecified: Secondary | ICD-10-CM

## 2013-01-13 DIAGNOSIS — D860 Sarcoidosis of lung: Secondary | ICD-10-CM | POA: Diagnosis present

## 2013-01-13 DIAGNOSIS — E785 Hyperlipidemia, unspecified: Secondary | ICD-10-CM | POA: Diagnosis present

## 2013-01-13 LAB — LIPID PANEL
Cholesterol: 161 mg/dL (ref 0–200)
HDL: 36 mg/dL — ABNORMAL LOW (ref >39)
Total CHOL/HDL Ratio: 4.5 RATIO

## 2013-01-13 LAB — CBC WITH DIFFERENTIAL/PLATELET
Basophils Relative: 1 % (ref 0–1)
Eosinophils Absolute: 1.1 10*3/uL — ABNORMAL HIGH (ref 0.0–0.7)
Eosinophils Relative: 12 % — ABNORMAL HIGH (ref 0–5)
MCH: 29.6 pg (ref 26.0–34.0)
MCHC: 33.1 g/dL (ref 30.0–36.0)
Monocytes Relative: 5 % (ref 3–12)
Neutrophils Relative %: 50 % (ref 43–77)
Platelets: 250 10*3/uL (ref 150–400)

## 2013-01-13 LAB — MRSA PCR SCREENING: MRSA by PCR: NEGATIVE

## 2013-01-13 LAB — URINE MICROSCOPIC-ADD ON

## 2013-01-13 LAB — BASIC METABOLIC PANEL
BUN: 11 mg/dL (ref 6–23)
Calcium: 9.2 mg/dL (ref 8.4–10.5)
GFR calc Af Amer: >90 mL/min (ref >90)
GFR calc non Af Amer: >90 mL/min (ref >90)
Potassium: 4.2 mEq/L (ref 3.5–5.1)
Sodium: 134 mEq/L — ABNORMAL LOW (ref 135–145)

## 2013-01-13 LAB — BLOOD GAS, ARTERIAL
Acid-Base Excess: 3.4 mmol/L — ABNORMAL HIGH (ref 0.0–2.0)
Drawn by: 103701
TCO2: 27 mmol/L (ref 0–100)
pCO2 arterial: 57.5 mmHg (ref 35.0–45.0)
pO2, Arterial: 57 mmHg — ABNORMAL LOW (ref 80.0–100.0)

## 2013-01-13 LAB — URINALYSIS, ROUTINE W REFLEX MICROSCOPIC
Glucose, UA: NEGATIVE mg/dL
Ketones, ur: NEGATIVE mg/dL
Protein, ur: NEGATIVE mg/dL

## 2013-01-13 MED ORDER — ALBUTEROL SULFATE (5 MG/ML) 0.5% IN NEBU
2.5000 mg | INHALATION_SOLUTION | Freq: Four times a day (QID) | RESPIRATORY_TRACT | Status: DC
  Administered 2013-01-13 – 2013-01-15 (×8): 2.5 mg via RESPIRATORY_TRACT
  Filled 2013-01-13 (×9): qty 0.5

## 2013-01-13 MED ORDER — HYDROCHLOROTHIAZIDE 25 MG PO TABS
25.0000 mg | ORAL_TABLET | Freq: Every day | ORAL | Status: DC
  Administered 2013-01-14 – 2013-01-15 (×2): 25 mg via ORAL
  Filled 2013-01-13 (×2): qty 1

## 2013-01-13 MED ORDER — GUAIFENESIN ER 600 MG PO TB12
600.0000 mg | ORAL_TABLET | Freq: Two times a day (BID) | ORAL | Status: DC
  Administered 2013-01-13 – 2013-01-15 (×4): 600 mg via ORAL
  Filled 2013-01-13 (×6): qty 1

## 2013-01-13 MED ORDER — GUAIFENESIN-DM 100-10 MG/5ML PO SYRP
10.0000 mL | ORAL_SOLUTION | Freq: Four times a day (QID) | ORAL | Status: DC | PRN
  Administered 2013-01-13 – 2013-01-14 (×2): 10 mL via ORAL
  Filled 2013-01-13 (×2): qty 10

## 2013-01-13 MED ORDER — METHYLPREDNISOLONE SODIUM SUCC 125 MG IJ SOLR
125.0000 mg | Freq: Once | INTRAMUSCULAR | Status: AC
  Administered 2013-01-13: 125 mg via INTRAVENOUS
  Filled 2013-01-13: qty 2

## 2013-01-13 MED ORDER — SODIUM CHLORIDE 0.9 % IJ SOLN
3.0000 mL | Freq: Two times a day (BID) | INTRAMUSCULAR | Status: DC
  Administered 2013-01-13 – 2013-01-14 (×2): 3 mL via INTRAVENOUS

## 2013-01-13 MED ORDER — TRAMADOL HCL 50 MG PO TABS
50.0000 mg | ORAL_TABLET | Freq: Four times a day (QID) | ORAL | Status: DC | PRN
  Administered 2013-01-13 – 2013-01-15 (×6): 50 mg via ORAL
  Filled 2013-01-13 (×7): qty 1

## 2013-01-13 MED ORDER — HYDROMORPHONE HCL PF 1 MG/ML IJ SOLN
1.0000 mg | Freq: Once | INTRAMUSCULAR | Status: AC
  Administered 2013-01-13: 1 mg via INTRAVENOUS
  Filled 2013-01-13: qty 1

## 2013-01-13 MED ORDER — ASPIRIN-DIPYRIDAMOLE ER 25-200 MG PO CP12
1.0000 | ORAL_CAPSULE | Freq: Two times a day (BID) | ORAL | Status: DC
  Administered 2013-01-13 – 2013-01-15 (×4): 1 via ORAL
  Filled 2013-01-13 (×6): qty 1

## 2013-01-13 MED ORDER — METHYLPREDNISOLONE SODIUM SUCC 40 MG IJ SOLR
40.0000 mg | Freq: Four times a day (QID) | INTRAMUSCULAR | Status: DC
  Administered 2013-01-13 – 2013-01-14 (×3): 40 mg via INTRAVENOUS
  Filled 2013-01-13 (×6): qty 1

## 2013-01-13 MED ORDER — KETOROLAC TROMETHAMINE 30 MG/ML IJ SOLN
30.0000 mg | Freq: Once | INTRAMUSCULAR | Status: AC
  Administered 2013-01-13: 30 mg via INTRAVENOUS
  Filled 2013-01-13: qty 1

## 2013-01-13 MED ORDER — SODIUM CHLORIDE 0.9 % IV SOLN: 250.0000 mL | INTRAVENOUS | Status: DC | PRN

## 2013-01-13 MED ORDER — ACETAMINOPHEN 325 MG PO TABS
650.0000 mg | ORAL_TABLET | Freq: Four times a day (QID) | ORAL | Status: DC | PRN
  Administered 2013-01-14 (×2): 650 mg via ORAL
  Filled 2013-01-13 (×3): qty 2

## 2013-01-13 MED ORDER — LISINOPRIL 20 MG PO TABS
20.0000 mg | ORAL_TABLET | Freq: Every day | ORAL | Status: DC
  Administered 2013-01-14: 20 mg via ORAL
  Filled 2013-01-13: qty 1

## 2013-01-13 MED ORDER — LISINOPRIL-HYDROCHLOROTHIAZIDE 20-25 MG PO TABS: 1.0000 | ORAL_TABLET | Freq: Every day | ORAL | Status: DC

## 2013-01-13 MED ORDER — IPRATROPIUM BROMIDE 0.02 % IN SOLN
0.5000 mg | Freq: Once | RESPIRATORY_TRACT | Status: AC
  Administered 2013-01-13: 0.5 mg via RESPIRATORY_TRACT
  Filled 2013-01-13: qty 2.5

## 2013-01-13 MED ORDER — ACETAMINOPHEN 650 MG RE SUPP: 650.0000 mg | Freq: Four times a day (QID) | RECTAL | Status: DC | PRN

## 2013-01-13 MED ORDER — IOHEXOL 350 MG/ML SOLN
100.0000 mL | Freq: Once | INTRAVENOUS | Status: AC | PRN
  Administered 2013-01-13: 100 mL via INTRAVENOUS

## 2013-01-13 MED ORDER — ALBUTEROL SULFATE (5 MG/ML) 0.5% IN NEBU: 2.5000 mg | INHALATION_SOLUTION | RESPIRATORY_TRACT | Status: DC | PRN

## 2013-01-13 MED ORDER — SODIUM CHLORIDE 0.9 % IJ SOLN
3.0000 mL | Freq: Two times a day (BID) | INTRAMUSCULAR | Status: DC
  Administered 2013-01-14: 11:00:00 via INTRAVENOUS
  Administered 2013-01-15: 3 mL via INTRAVENOUS

## 2013-01-13 MED ORDER — LAMOTRIGINE 25 MG PO TABS
50.0000 mg | ORAL_TABLET | Freq: Two times a day (BID) | ORAL | Status: DC
  Administered 2013-01-13 – 2013-01-14 (×3): 50 mg via ORAL
  Filled 2013-01-13 (×6): qty 2

## 2013-01-13 MED ORDER — HEPARIN SODIUM (PORCINE) 5000 UNIT/ML IJ SOLN
5000.0000 [IU] | Freq: Three times a day (TID) | INTRAMUSCULAR | Status: DC
  Administered 2013-01-13 – 2013-01-15 (×5): 5000 [IU] via SUBCUTANEOUS
  Filled 2013-01-13 (×10): qty 1

## 2013-01-13 MED ORDER — ONDANSETRON HCL 4 MG/2ML IJ SOLN: 4.0000 mg | Freq: Three times a day (TID) | INTRAMUSCULAR | Status: DC | PRN

## 2013-01-13 MED ORDER — IPRATROPIUM BROMIDE 0.02 % IN SOLN
0.5000 mg | Freq: Four times a day (QID) | RESPIRATORY_TRACT | Status: DC
  Administered 2013-01-13 – 2013-01-15 (×8): 0.5 mg via RESPIRATORY_TRACT
  Filled 2013-01-13 (×9): qty 2.5

## 2013-01-13 MED ORDER — LORATADINE 10 MG PO TABS
10.0000 mg | ORAL_TABLET | Freq: Every day | ORAL | Status: DC
  Administered 2013-01-14 – 2013-01-15 (×2): 10 mg via ORAL
  Filled 2013-01-13 (×2): qty 1

## 2013-01-13 MED ORDER — SODIUM CHLORIDE 0.9 % IJ SOLN: 3.0000 mL | INTRAMUSCULAR | Status: DC | PRN

## 2013-01-13 MED ORDER — LEVOFLOXACIN IN D5W 750 MG/150ML IV SOLN
750.0000 mg | INTRAVENOUS | Status: DC
  Administered 2013-01-13 – 2013-01-14 (×2): 750 mg via INTRAVENOUS
  Filled 2013-01-13 (×3): qty 150

## 2013-01-13 MED ORDER — DILTIAZEM HCL ER 240 MG PO CP24
240.0000 mg | ORAL_CAPSULE | Freq: Every day | ORAL | Status: DC
Start: 2013-01-14 — End: 2013-01-15
  Administered 2013-01-14 – 2013-01-15 (×2): 240 mg via ORAL
  Filled 2013-01-13 (×2): qty 1

## 2013-01-13 MED ORDER — PANTOPRAZOLE SODIUM 40 MG PO TBEC
40.0000 mg | DELAYED_RELEASE_TABLET | Freq: Every day | ORAL | Status: DC
  Administered 2013-01-14 – 2013-01-15 (×2): 40 mg via ORAL
  Filled 2013-01-13 (×2): qty 1

## 2013-01-13 MED ORDER — HYDROMORPHONE HCL PF 1 MG/ML IJ SOLN
0.5000 mg | INTRAMUSCULAR | Status: DC | PRN
  Administered 2013-01-13 – 2013-01-15 (×11): 0.5 mg via INTRAVENOUS
  Filled 2013-01-13 (×11): qty 1

## 2013-01-13 MED ORDER — ALBUTEROL (5 MG/ML) CONTINUOUS INHALATION SOLN
10.0000 mg/h | INHALATION_SOLUTION | RESPIRATORY_TRACT | Status: DC
  Administered 2013-01-13: 10 mg/h via RESPIRATORY_TRACT
  Filled 2013-01-13: qty 20

## 2013-01-13 NOTE — ED Notes (Signed)
Patient transported to CT 

## 2013-01-13 NOTE — ED Notes (Signed)
Pt c/o pain med didn't help, maybe worse now than before.  Made Kaitlyn aware.

## 2013-01-13 NOTE — ED Notes (Signed)
Pt states that he was seen at [facility] over week ago and diagnosed with bronchitis and was given 7 day supply of antibiotic.  Pt saw PCp on Friday and was started on bactrim for prostate and bladder infection.  Pt states he has bad cough and very tight in is chest and having shortness of breath.  Pt also c/o back pain when he coughs.  Pt states that his cough is dry with very few times of coughing anything up.

## 2013-01-13 NOTE — H&P (Signed)
PCP:   No primary provider on file.   Chief Complaint:  Shortness of breath.  HPI: This is a 57 year old male, with known history of tobacco abuse, quit about 8 weeks ago, dyslipidemia, HTN, recurrent TIAs, chronic headaches under care of Dr Joycelyn Schmid, neurologist, OA, lumbar DJD/chronic low back pain under care of Dr Ethelene Hal, s/p total right hip arthroplasty s/p right knee arthroscopy x 2, urolithiasis, pulmonary sarcoidosis, presenting with progressive SOB. According to patient, he was treated with a 7-Day course of antibiotics by his primary MD at that time, but never really seamed to improve. Over the last 3 months, he has become progressively SOB, associated with a non-productive cough. He saw his primary MD at [facility], for dysuria and urinary frequency on 01/09/13, was prescribed Bactrim DS BID (a 2-week course) for U/A-confirmed UTI and possible acute prostatitis. He has been compliant with this medication, and passed a urinary stone on 01/11/13. On 01/11/13-01/12/13, he had fevers, sometimes as high as 102, but no chills. SOB has become much worse, he is unable to catch his breath, and effort tolerance is now down to only about 10 steps. He came to the ED on 01/13/13, and was admitted for further management.  Allergies:   Allergies  Allergen Reactions  . Morphine Hives, Itching and Rash      Past Medical History  Diagnosis Date  . Kidney calculi   . Hypertension   . History of TIAs   . Arthritis   . Back pain     trouble with lumbar 2, 3, and 4 - degenerative disease per pt  . Unspecified transient cerebral ischemia   . HA (headache)   . Syncope and collapse     Past Surgical History  Procedure Laterality Date  . Total hip arthroplasty    . Joint replacement    . Knee surgery    . Hernia repair  2003    Prior to Admission medications   Medication Sig Start Date End Date Taking? Authorizing Provider  albuterol (PROVENTIL HFA;VENTOLIN HFA) 108 (90 BASE) MCG/ACT  inhaler Inhale 1-2 puffs into the lungs every 6 (six) hours as needed for wheezing. 11/27/12  Yes Derwood Kaplan, MD  diltiazem (DILACOR XR) 240 MG 24 hr capsule Take 240 mg by mouth daily.   Yes Historical Provider, MD  dipyridamole-aspirin (AGGRENOX) 200-25 MG per 12 hr capsule Take 1 capsule by mouth 2 (two) times daily.   Yes Historical Provider, MD  lamoTRIgine (LAMICTAL) 25 MG tablet Take 2 tablets (50 mg total) by mouth 2 (two) times daily. 01/07/13  Yes Nilda Riggs, NP  lisinopril-hydrochlorothiazide (PRINZIDE,ZESTORETIC) 20-25 MG per tablet Take 1 tablet by mouth daily.   Yes Historical Provider, MD  loratadine (CLARITIN) 10 MG tablet Take 10 mg by mouth daily.   Yes Historical Provider, MD  omeprazole (PRILOSEC) 40 MG capsule Take 40 mg by mouth daily.   Yes Historical Provider, MD  sulfamethoxazole-trimethoprim (BACTRIM DS) 800-160 MG per tablet Take 1 tablet by mouth 2 (two) times daily. 01/04/13 01/18/13 Yes Historical Provider, MD  traMADol (ULTRAM) 50 MG tablet Take 50 mg by mouth every 6 (six) hours as needed for pain.   Yes Historical Provider, MD    Social History: Patient reports that he has been smoking Cigarettes.  He has a 10 pack-year smoking history. He has never used smokeless tobacco. He reports that he does not drink alcohol or use illicit drugs.  Family History  Problem Relation Age of Onset  .  Cancer Maternal Grandfather     prostate  . Diabetes Mother   . Heart Problems Father     Review of Systems:  As per HPI and chief complaint. Patent denies fatigue, diminished appetite, weight loss, chills, headache, blurred vision, difficulty in speaking, dysphagia, chest pain, orthopnea, paroxysmal nocturnal dyspnea, nausea, diaphoresis, abdominal pain, vomiting, diarrhea, belching, heartburn, hematemesis, melena, hematochezia, lower extremity swelling, pain, or redness. The rest of the systems review is negative.  Physical Exam:  General:  Alert, communicative,  fully oriented, talking in complete sentences, not short of breath at rest.  HEENT:  No clinical pallor, no jaundice, no conjunctival injection or discharge. Hydration status is fair.  NECK:  Supple, JVP not seen, no carotid bruits, no palpable lymphadenopathy, no palpable goiter. CHEST:  Clinically clear to auscultation, no wheezes, no crackles. HEART:  Sounds 1 and 2 heard, normal, regular, no murmurs. ABDOMEN:  Morbidly obese, soft, non-tender, no palpable organomegaly, no palpable masses, normal bowel sounds. GENITALIA:  Not examined. LOWER EXTREMITIES:  No pitting edema, palpable peripheral pulses. MUSCULOSKELETAL SYSTEM:  Unremarkable. CENTRAL NERVOUS SYSTEM:  No focal neurologic deficit on gross examination.  Labs on Admission:  Results for orders placed during the hospital encounter of 01/13/13 (from the past 48 hour(s))  CBC WITH DIFFERENTIAL     Status: Abnormal   Collection Time    01/13/13 11:03 AM      Result Value Range   WBC 9.0  4.0 - 10.5 K/uL   RBC 4.87  4.22 - 5.81 MIL/uL   Hemoglobin 14.4  13.0 - 17.0 g/dL   HCT 45.4  09.8 - 11.9 %   MCV 89.3  78.0 - 100.0 fL   MCH 29.6  26.0 - 34.0 pg   MCHC 33.1  30.0 - 36.0 g/dL   RDW 14.7  82.9 - 56.2 %   Platelets 250  150 - 400 K/uL   Neutrophils Relative % 50  43 - 77 %   Neutro Abs 4.5  1.7 - 7.7 K/uL   Lymphocytes Relative 32  12 - 46 %   Lymphs Abs 2.9  0.7 - 4.0 K/uL   Monocytes Relative 5  3 - 12 %   Monocytes Absolute 0.5  0.1 - 1.0 K/uL   Eosinophils Relative 12 (*) 0 - 5 %   Eosinophils Absolute 1.1 (*) 0.0 - 0.7 K/uL   Basophils Relative 1  0 - 1 %   Basophils Absolute 0.1  0.0 - 0.1 K/uL  BASIC METABOLIC PANEL     Status: Abnormal   Collection Time    01/13/13 11:03 AM      Result Value Range   Sodium 134 (*) 135 - 145 mEq/L   Potassium 4.2  3.5 - 5.1 mEq/L   Chloride 97  96 - 112 mEq/L   CO2 31  19 - 32 mEq/L   Glucose, Bld 86  70 - 99 mg/dL   BUN 11  6 - 23 mg/dL   Creatinine, Ser 1.30  0.50 - 1.35  mg/dL   Calcium 9.2  8.4 - 86.5 mg/dL   GFR calc non Af Amer >90  >90 mL/min   GFR calc Af Amer >90  >90 mL/min   Comment:            The eGFR has been calculated     using the CKD EPI equation.     This calculation has not been     validated in all clinical     situations.  eGFR's persistently     <90 mL/min signify     possible Chronic Kidney Disease.  POCT I-STAT TROPONIN I     Status: None   Collection Time    01/13/13 11:07 AM      Result Value Range   Troponin i, poc 0.00  0.00 - 0.08 ng/mL   Comment 3            Comment: Due to the release kinetics of cTnI,     a negative result within the first hours     of the onset of symptoms does not rule out     myocardial infarction with certainty.     If myocardial infarction is still suspected,     repeat the test at appropriate intervals.    Radiological Exams on Admission: Dg Chest 2 View  01/13/2013   *RADIOLOGY REPORT*  Clinical Data: Shortness of breath.  CHEST - 2 VIEW  Comparison: Chest x-ray 11/27/2012.  Findings: Lung volumes are normal.  No consolidative airspace disease.  No pleural effusions.  No pneumothorax.  No pulmonary nodule or mass noted.  Pulmonary vasculature and the cardiomediastinal silhouette are within normal limits.  IMPRESSION: 1. No radiographic evidence of acute cardiopulmonary disease.   Original Report Authenticated By: Trudie Reed, M.D.   Ct Angio Chest Pe W/cm &/or Wo Cm  01/13/2013   *RADIOLOGY REPORT*  Clinical Data: Shortness of breath.  Cough.  Back pain.  CT ANGIOGRAPHY CHEST  Technique:  Multidetector CT imaging of the chest using the standard protocol during bolus administration of intravenous contrast. Multiplanar reconstructed images including MIPs were obtained and reviewed to evaluate the vascular anatomy.  Contrast: OMNIPAQUE IOHEXOL 350 MG/ML SOLN  Comparison: No priors.  Findings:  Mediastinum: There is considerable respiratory motion on today's examination, which limits  sensitivity for detection of small subsegmental sized pulmonary emboli.  With this limitation in mind, there is no definite to clinically relevant central, lobar or segmental sized embolus noted. Heart size is borderline enlarged. There is no significant pericardial fluid, thickening or pericardial calcification. There is atherosclerosis of the thoracic aorta, the great vessels of the mediastinum and the coronary arteries, including calcified atherosclerotic plaque in the left anterior descending and right coronary arteries. Numerous mediastinal and bilateral hilar lymph nodes are borderline to mildly enlarged and extensively calcified, similar to the remote prior examination.  Esophagus is unremarkable in appearance.No acute abnormality of the thoracic aorta; specifically, no aneurysm or dissection.  Lungs/Pleura: No acute consolidative airspace disease.  No pleural effusions.  No suspicious appearing pulmonary nodules or masses. There is a small cluster of fibrocavitary blebs in the right apex which are unchanged compared to the prior study and most compatible with areas of scarring.  Mild diffuse bronchial wall thickening is unchanged.  Upper Abdomen: Unremarkable.  Musculoskeletal: There are no aggressive appearing lytic or blastic lesions noted in the visualized portions of the skeleton.  IMPRESSION: 1.  Despite the limitations of today's examination there is no evidence to suggest clinically relevant central, lobar or segmental sized pulmonary embolism. 2.  Mediastinal and bilateral hilar calcified lymphadenopathy redemonstrated, compatible with the reported clinical history of sarcoidosis. 3.  Mild diffuse bronchial wall thickening.  This appears be chronic in this patient. 4.  No acute abnormality of the thoracic aorta to account for the patient's history of back pain.   Original Report Authenticated By: Trudie Reed, M.D.    Assessment/Plan Active Problems:    1. Dyspnea/Chronic respiratory  failure: Patient presented  with progressive SOB over 3 months, now much worse. CXR was devoid of active disease, CTA revealed no evidence to suggest clinically relevant central, lobar or segmental sized pulmonary embolism, there was mediastinal and bilateral hilar calcified lymphadenopathy as well as mild diffuse bronchial wall thickening. Clinically and radiologically, there were no features of CHF. He is currently afebrile despite history of pyrexia up to 102, and wcc is normal. Oxygen saturation on RA was said to be in the 80s in the ED, consistent with hypoxic respiratory failure, possibly acute on chronic, improved to the 90s on O2. Etiology appears to be exacerbation/progression of sarcoidosis, although given body habitus, OSA may be a contributory factor. Will obtain ABG, and manage as described below, in addition to supportive treatment with oxygen, and bronchodilator nebulizers.   2. Pulmonary sarcoidosis: See discussion above. Will commence Levaquin, parenteral steroids. Blood cultures will be obtained if patient spikes a fever. Will consult pulmonary for input and subsequent follow up.  3. HTN (hypertension): BP appears reasonably controlled. Will continue pre-admission antihypertensives, and observe.  4. Dyslipidemia: Not currently on statin. will check Lipid profile and TSH.  5. Chronic low back pain: Patient is under care of Dr Ethelene Hal, for this. Will manage with pre-admission analgesics.  6. History of TIAs: On Aggrenox.  7. Chronic headaches: On Lamictal/Not problematic.  8. UTI/Prostatitis: On day# 5 of planned 2-week course of antibiotics. Now managing with Levaquin. Re-check U/A. 9. Urolithiasis: Passed a small stone on 01/11/13. Not problematic at this time.   Further management will depend on clinical course.  Comment: Patient is FULL CODE.    Time Spent on Admission: 1 hour.   Scott Holden,CHRISTOPHER 01/13/2013, 3:35 PM

## 2013-01-13 NOTE — Progress Notes (Signed)
Comment:   ABG report seen: PH 7.339, PCO2 57.5, O2 57.0, Bicarb 30.1, O2 sat 88.0. These findings are consistent with Type 2 respiratory failure.   Plan: 1. Will initiate BIPAP.  2. Transfer to SDU.  C. Jersie Beel. MD, FACP.  Note: According to patient, he was scheduled for PSG on 01/14/13.

## 2013-01-13 NOTE — Progress Notes (Signed)
Pt transported to SDU Room 1236.  Receiving RN- Merril Abbe.  Pt in NAD at time of transport.  Pt transported via w/c with cane and pt's clothing.  Telemetry box removed in SDU.

## 2013-01-13 NOTE — Progress Notes (Signed)
MD paged and made aware for ABG results (see flowsheet).

## 2013-01-13 NOTE — ED Provider Notes (Signed)
History    CSN: [id number] Arrival date & time 01/13/13  4098  First MD Initiated Contact with Patient 01/13/13 1000     Chief Complaint  Patient presents with  . Shortness of Breath  . Cough  . Back Pain   (Consider location/radiation/quality/duration/timing/severity/associated sxs/prior Treatment) HPI Comments: Patient is a 57 year old male with a past medical history TIA, back pain, and pulmonary sarcoidosis who presents with SOB for the past 3 days. Symptoms started gradually and progressively worsened since the onset. Patient reports associated chest tightness in his central chest without radiation. Patient also reports associated non productive cough and back pain exacerbated by his cough. Patient has tried expectorants and albuterol inhaler without relief. No aggravating/alleviating factors. Patient does not wear oxygen at home.   Past Medical History  Diagnosis Date  . Kidney calculi   . Hypertension   . History of TIAs   . Arthritis   . Back pain     trouble with lumbar 2, 3, and 4 - degenerative disease per pt  . Unspecified transient cerebral ischemia   . HA (headache)   . Syncope and collapse    Past Surgical History  Procedure Laterality Date  . Total hip arthroplasty    . Joint replacement    . Knee surgery    . Hernia repair  2003   Family History  Problem Relation Age of Onset  . Cancer Maternal Grandfather     prostate  . Diabetes Mother   . Heart Problems Father    History  Substance Use Topics  . Smoking status: Current Every Day Smoker -- 0.50 packs/day for 20 years    Types: Cigarettes  . Smokeless tobacco: Never Used     Comment: three cigarettes daily  . Alcohol Use: No    Review of Systems  Respiratory: Positive for cough and shortness of breath.   All other systems reviewed and are negative.    Allergies  Morphine  Home Medications   Current Outpatient Rx  Name  Route  Sig  Dispense  Refill  . albuterol (PROVENTIL HFA;VENTOLIN  HFA) 108 (90 BASE) MCG/ACT inhaler   Inhalation   Inhale 1-2 puffs into the lungs every 6 (six) hours as needed for wheezing.   1 Inhaler   0   . dipyridamole-aspirin (AGGRENOX) 200-25 MG per 12 hr capsule   Oral   Take 1 capsule by mouth 2 (two) times daily.         Marland Kitchen lamoTRIgine (LAMICTAL) 25 MG tablet   Oral   Take 2 tablets (50 mg total) by mouth 2 (two) times daily.   120 tablet   6   . lisinopril-hydrochlorothiazide (PRINZIDE,ZESTORETIC) 20-25 MG per tablet   Oral   Take 1 tablet by mouth daily.         Marland Kitchen loratadine (CLARITIN) 10 MG tablet   Oral   Take 10 mg by mouth daily.         Marland Kitchen omeprazole (PRILOSEC) 40 MG capsule   Oral   Take 40 mg by mouth daily.         . traMADol (ULTRAM) 50 MG tablet   Oral   Take 50 mg by mouth every 6 (six) hours as needed for pain.          BP 139/77  Pulse 80  Temp(Src) 98.7 F (37.1 C) (Oral)  Resp 21  SpO2 85% Physical Exam  Nursing note and vitals reviewed. Constitutional: He is oriented to person,  place, and time. He appears well-developed and well-nourished. No distress.  HENT:  Head: Normocephalic and atraumatic.  Eyes: Conjunctivae and EOM are normal.  Neck: Normal range of motion.  Cardiovascular: Normal rate and regular rhythm.  Exam reveals no gallop and no friction rub.   No murmur heard. Pulmonary/Chest: Effort normal. He has wheezes. He has no rales. He exhibits no tenderness.  Wheezing noted bilaterally.   Abdominal: Soft. He exhibits no distension. There is no tenderness. There is no rebound and no guarding.  Musculoskeletal: Normal range of motion.  No calf tenderness to palpation or lower extremity edema.   Neurological: He is alert and oriented to person, place, and time. Coordination normal.  Speech is goal-oriented. Moves limbs without ataxia.   Skin: Skin is warm and dry.  Psychiatric: He has a normal mood and affect. His behavior is normal.    ED Course  Procedures (including critical  care time) Labs Reviewed  CBC WITH DIFFERENTIAL - Abnormal; Notable for the following:    Eosinophils Relative 12 (*)    Eosinophils Absolute 1.1 (*)    All other components within normal limits  BASIC METABOLIC PANEL - Abnormal; Notable for the following:    Sodium 134 (*)    All other components within normal limits  POCT I-STAT TROPONIN I   Dg Chest 2 View  01/13/2013   *RADIOLOGY REPORT*  Clinical Data: Shortness of breath.  CHEST - 2 VIEW  Comparison: Chest x-ray 11/27/2012.  Findings: Lung volumes are normal.  No consolidative airspace disease.  No pleural effusions.  No pneumothorax.  No pulmonary nodule or mass noted.  Pulmonary vasculature and the cardiomediastinal silhouette are within normal limits.  IMPRESSION: 1. No radiographic evidence of acute cardiopulmonary disease.   Original Report Authenticated By: Trudie Reed, M.D.   Ct Angio Chest Pe W/cm &/or Wo Cm  01/13/2013   *RADIOLOGY REPORT*  Clinical Data: Shortness of breath.  Cough.  Back pain.  CT ANGIOGRAPHY CHEST  Technique:  Multidetector CT imaging of the chest using the standard protocol during bolus administration of intravenous contrast. Multiplanar reconstructed images including MIPs were obtained and reviewed to evaluate the vascular anatomy.  Contrast: OMNIPAQUE IOHEXOL 350 MG/ML SOLN  Comparison: No priors.  Findings:  Mediastinum: There is considerable respiratory motion on today's examination, which limits sensitivity for detection of small subsegmental sized pulmonary emboli.  With this limitation in mind, there is no definite to clinically relevant central, lobar or segmental sized embolus noted. Heart size is borderline enlarged. There is no significant pericardial fluid, thickening or pericardial calcification. There is atherosclerosis of the thoracic aorta, the great vessels of the mediastinum and the coronary arteries, including calcified atherosclerotic plaque in the left anterior descending and right  coronary arteries. Numerous mediastinal and bilateral hilar lymph nodes are borderline to mildly enlarged and extensively calcified, similar to the remote prior examination.  Esophagus is unremarkable in appearance.No acute abnormality of the thoracic aorta; specifically, no aneurysm or dissection.  Lungs/Pleura: No acute consolidative airspace disease.  No pleural effusions.  No suspicious appearing pulmonary nodules or masses. There is a small cluster of fibrocavitary blebs in the right apex which are unchanged compared to the prior study and most compatible with areas of scarring.  Mild diffuse bronchial wall thickening is unchanged.  Upper Abdomen: Unremarkable.  Musculoskeletal: There are no aggressive appearing lytic or blastic lesions noted in the visualized portions of the skeleton.  IMPRESSION: 1.  Despite the limitations of today's examination there is  no evidence to suggest clinically relevant central, lobar or segmental sized pulmonary embolism. 2.  Mediastinal and bilateral hilar calcified lymphadenopathy redemonstrated, compatible with the reported clinical history of sarcoidosis. 3.  Mild diffuse bronchial wall thickening.  This appears be chronic in this patient. 4.  No acute abnormality of the thoracic aorta to account for the patient's history of back pain.   Original Report Authenticated By: Trudie Reed, M.D.   1. Hypoxia     MDM  10:35 AM Chest xray unremarkable for acute changes. Labs pending. Patient oxygen saturation around 90% on 2L oxygen.   1:36 PM Chest CT shows sarcoidosis. Patient will be admitted to the hospital for hypoxia.   1:58 PM Dr. Brien Few will admit the patient for hypoxia.   Emilia Beck, PA-C 01/13/13 1401

## 2013-01-14 DIAGNOSIS — J42 Unspecified chronic bronchitis: Secondary | ICD-10-CM | POA: Diagnosis present

## 2013-01-14 DIAGNOSIS — J309 Allergic rhinitis, unspecified: Secondary | ICD-10-CM

## 2013-01-14 DIAGNOSIS — G478 Other sleep disorders: Secondary | ICD-10-CM

## 2013-01-14 DIAGNOSIS — G4730 Sleep apnea, unspecified: Secondary | ICD-10-CM | POA: Diagnosis present

## 2013-01-14 DIAGNOSIS — I359 Nonrheumatic aortic valve disorder, unspecified: Secondary | ICD-10-CM

## 2013-01-14 DIAGNOSIS — F17200 Nicotine dependence, unspecified, uncomplicated: Secondary | ICD-10-CM

## 2013-01-14 LAB — CBC
MCH: 29.1 pg (ref 26.0–34.0)
MCV: 89.5 fL (ref 78.0–100.0)
Platelets: 271 10*3/uL (ref 150–400)
RBC: 5.05 MIL/uL (ref 4.22–5.81)

## 2013-01-14 LAB — TSH: TSH: 1.435 u[IU]/mL (ref 0.350–4.500)

## 2013-01-14 LAB — COMPREHENSIVE METABOLIC PANEL
AST: 19 U/L (ref 0–37)
BUN: 13 mg/dL (ref 6–23)
CO2: 30 mEq/L (ref 19–32)
Calcium: 9.5 mg/dL (ref 8.4–10.5)
Creatinine, Ser: 0.93 mg/dL (ref 0.50–1.35)
GFR calc Af Amer: >90 mL/min (ref >90)
GFR calc non Af Amer: >90 mL/min (ref >90)
Glucose, Bld: 155 mg/dL — ABNORMAL HIGH (ref 70–99)

## 2013-01-14 MED ORDER — ALBUTEROL SULFATE (5 MG/ML) 0.5% IN NEBU: 2.5000 mg | INHALATION_SOLUTION | RESPIRATORY_TRACT | Status: DC | PRN

## 2013-01-14 MED ORDER — LOSARTAN POTASSIUM 50 MG PO TABS
100.0000 mg | ORAL_TABLET | Freq: Every day | ORAL | Status: DC
  Filled 2013-01-14 (×2): qty 2

## 2013-01-14 MED ORDER — METHYLPREDNISOLONE SODIUM SUCC 40 MG IJ SOLR
20.0000 mg | Freq: Two times a day (BID) | INTRAMUSCULAR | Status: DC
  Administered 2013-01-14: 20 mg via INTRAVENOUS
  Filled 2013-01-14 (×3): qty 0.5

## 2013-01-14 NOTE — Progress Notes (Signed)
  Echocardiogram 2D Echocardiogram has been performed.  Scott Holden, [facility] 01/14/2013, 10:59 AM

## 2013-01-14 NOTE — Consult Note (Signed)
PULMONARY  / CRITICAL CARE MEDICINE  Name: Scott Holden MRN: [id number] DOB: 1956-03-25    ADMISSION DATE:  01/13/2013 CONSULTATION DATE:  01/14/2013  REFERRING MD :  Scott Holden PRIMARY SERVICE:  TRH  CHIEF COMPLAINT:  SOB x 3 months  BRIEF PATIENT DESCRIPTION: [name] with hx of kidney calculi, HTN, TIAs, arthritis, headache, sarcoidosis admitted 7/21 with ~ 1 wk of non-productive cough, and progressive shortness of breath.   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 Marjory Lies), chronic lower back pain (treated by Dr. Ethelene Hal), s/p total R hip arthroplasty, s/p R knee arthroscopy x 2, urolithiasis (resolved 7/19), UTI (current) and sarcoidosis x 8-10 years who presented to [facility] ER with progressive SOB x 5 days. Patient states symptoms began last Thursday (7/17) and include non-productive cough and difficulty catching his breath when walking.  He reports fever last Friday but denies chills. States dysuria began 7/17 and was treated by PCP ([facility]) with Bactrim. States symptoms are improving.  Admits SOB symptoms are improving with 2L O2 however, still feels pressure in mid-sternal region and that he needs to "cough something up."  Work up included clear CXR and CTA of chest which was negative for PE.  PCCM consulted for pulmonary evaluation.    PAST MEDICAL HISTORY :  Past Medical History  Diagnosis Date  . Kidney calculi   . Hypertension   . History of TIAs   . Arthritis   . Back pain     trouble with lumbar 2, 3, and 4 - degenerative disease per pt  . Unspecified transient cerebral ischemia   . HA  (headache)   . Syncope and collapse    Past Surgical History  Procedure Laterality Date  . Total hip arthroplasty    . Joint replacement    . Knee surgery    . Hernia repair  2003   Prior to Admission medications   Medication Sig Start Date End Date Taking? Authorizing Provider  albuterol (PROVENTIL HFA;VENTOLIN HFA) 108 (90 BASE) MCG/ACT inhaler Inhale 1-2 puffs into the lungs every 6 (six) hours as needed for wheezing. 11/27/12  Yes Derwood Kaplan, MD  diltiazem (DILACOR XR) 240 MG 24 hr capsule Take 240 mg by mouth daily.   Yes Historical Provider, MD  dipyridamole-aspirin (AGGRENOX) 200-25 MG per 12 hr capsule Take 1 capsule by mouth 2 (two) times daily.   Yes Historical Provider, MD  lamoTRIgine (LAMICTAL) 25 MG tablet Take 2 tablets (50 mg total) by mouth 2 (two) times daily. 01/07/13  Yes Nilda Riggs, NP  lisinopril-hydrochlorothiazide (PRINZIDE,ZESTORETIC) 20-25 MG per tablet Take 1 tablet by mouth daily.   Yes Historical Provider, MD  loratadine (CLARITIN) 10 MG tablet Take 10 mg by mouth daily.   Yes Historical Provider, MD  omeprazole (PRILOSEC) 40 MG capsule Take 40 mg by mouth daily.   Yes Historical Provider, MD  sulfamethoxazole-trimethoprim (BACTRIM DS) 800-160 MG per tablet Take 1 tablet by mouth 2 (two) times daily. 01/04/13 01/18/13 Yes Historical Provider, MD  traMADol (ULTRAM) 50 MG tablet Take 50 mg by mouth every 6 (six) hours as needed for pain.   Yes  Historical Provider, MD   Allergies  Allergen Reactions  . Morphine Hives, Itching and Rash    FAMILY HISTORY:  Family History  Problem Relation Age of Onset  . Cancer Maternal Grandfather     prostate  . Diabetes Mother   . Heart Problems Father    SOCIAL HISTORY:  reports that he has been smoking Cigarettes.  He has a 10 pack-year smoking history. He has never used smokeless tobacco. He reports that he does not drink alcohol or use illicit drugs.  REVIEW OF SYSTEMS:   Constitutional: Negative for  fever, chills Respiratory: Positive for cough, SOB, wheezing. Negative for hemoptysis, sputum production   Cardiovascular: Negative for chest pain. Positive for leg swelling. Gastrointestinal: Negative for heartburn, nausea, vomiting, abdominal pain, diarrhea, constipation, blood in stool and melena.  Genitourinary: Positive for dysuria.  Musculoskeletal: Positive for back pain Neurological: Positive for recurrent TIAs and weakness. Negative for headaches.    SUBJECTIVE: Patient alert, sitting upright in bed, able to respond to questions and commands  VITAL SIGNS: Temp:  [98 F (36.7 C)-98.7 F (37.1 C)] 98.4 F (36.9 C) (07/22 0800) Pulse Rate:  [62-80] 71 (07/22 0741) Resp:  [11-25] 16 (07/22 0741) BP: (132-175)/(61-95) 175/86 mmHg (07/22 0741) SpO2:  [85 %-98 %] 98 % (07/22 0812) Weight:  [300 lb 0.7 oz (136.1 kg)-302 lb 9.6 oz (137.258 kg)] 300 lb 0.7 oz (136.1 kg) (07/22 0500)  PHYSICAL EXAMINATION: General:  [name], NAD Neuro:  AAOx4, no focal neuro deficits Cardiovascular:  RRR, no m/r/g Lungs:  resp's even bilaterally, CTA, on nasal cannula 2L Abdomen:  BS+, soft, non-tender, ND, no masses Extremities: no edema Skin:  Warm, no cyanosis   Recent Labs Lab 01/13/13 1103 01/14/13 0337  NA 134* 131*  K 4.2 5.0  CL 97 93*  CO2 31 30  BUN 11 13  CREATININE 0.95 0.93  GLUCOSE 86 155*    Recent Labs Lab 01/13/13 1103 01/14/13 0337  HGB 14.4 14.7  HCT 43.5 45.2  WBC 9.0 7.4  PLT 250 271   Dg Chest 2 View  01/13/2013   *RADIOLOGY REPORT*  Clinical Data: Shortness of breath.  CHEST - 2 VIEW  Comparison: Chest x-ray 11/27/2012.  Findings: Lung volumes are normal.  No consolidative airspace disease.  No pleural effusions.  No pneumothorax.  No pulmonary nodule or mass noted.  Pulmonary vasculature and the cardiomediastinal silhouette are within normal limits.  IMPRESSION: 1. No radiographic evidence of acute cardiopulmonary disease.   Original Report  Authenticated By: Trudie Reed, M.D.   Ct Angio Chest Pe W/cm &/or Wo Cm  01/13/2013   *RADIOLOGY REPORT*  Clinical Data: Shortness of breath.  Cough.  Back pain.  CT ANGIOGRAPHY CHEST  Technique:  Multidetector CT imaging of the chest using the standard protocol during bolus administration of intravenous contrast. Multiplanar reconstructed images including MIPs were obtained and reviewed to evaluate the vascular anatomy.  Contrast: OMNIPAQUE IOHEXOL 350 MG/ML SOLN  Comparison: No priors.  Findings:  Mediastinum: There is considerable respiratory motion on today's examination, which limits sensitivity for detection of small subsegmental sized pulmonary emboli.  With this limitation in mind, there is no definite to clinically relevant central, lobar or segmental sized embolus noted. Heart size is borderline enlarged. There is no significant pericardial fluid, thickening or pericardial calcification. There is atherosclerosis of the thoracic aorta, the great vessels of the mediastinum and the coronary arteries, including calcified atherosclerotic plaque in the left anterior descending and right  coronary arteries. Numerous mediastinal and bilateral hilar lymph nodes are borderline to mildly enlarged and extensively calcified, similar to the remote prior examination.  Esophagus is unremarkable in appearance.No acute abnormality of the thoracic aorta; specifically, no aneurysm or dissection.  Lungs/Pleura: No acute consolidative airspace disease.  No pleural effusions.  No suspicious appearing pulmonary nodules or masses. There is a small cluster of fibrocavitary blebs in the right apex which are unchanged compared to the prior study and most compatible with areas of scarring.  Mild diffuse bronchial wall thickening is unchanged.  Upper Abdomen: Unremarkable.  Musculoskeletal: There are no aggressive appearing lytic or blastic lesions noted in the visualized portions of the skeleton.  IMPRESSION: 1.  Despite  the limitations of today's examination there is no evidence to suggest clinically relevant central, lobar or segmental sized pulmonary embolism. 2.  Mediastinal and bilateral hilar calcified lymphadenopathy redemonstrated, compatible with the reported clinical history of sarcoidosis. 3.  Mild diffuse bronchial wall thickening.  This appears be chronic in this patient. 4.  No acute abnormality of the thoracic aorta to account for the patient's history of back pain.   Original Report Authenticated By: Trudie Reed, M.D.    ASSESSMENT / PLAN: PULMONARY A: Dyspnea / chronic respiratory failure - 57 y/o with cough, hypoxia, dyspnea, hx of sarcoidosis.  Dyspnea is likely multifactorial in setting of acute exacerbation of COPD / obstructive lung dz, OSA / OHS, and deconditioning.  Other considerations to include diastolic dysfunction, pulm hypertension and ADR of medication (lisinopril).   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 Mild acute respiratory acidosis w/slight metabolic alkalosis  P: -continue Levaquin x 5 days -continue Solu-medrol with one week taper to off -continue ipratropium and albuterol -oxygen to support sat goal 90-94% -CPAP QHS & PRN Sleep -outpatient PFTs, outpatient pulmonary follow up -consider changing ACE inhibitor with further risk stratification from cardiac / renal standpoint (if no systolic fx on ECHO or DM with A1c)  CARDIOVASCULAR A: Hypertension - on multidrug regimen at home, including ACE-I Dyslipidemia - not currently on meds, lipid panel WNL except low HDL (36) Troponin 0.00 P: -continue to monitor BP -continue HTN meds -ECHO pending -EKG  RENAL  A: UTI / Prostatitis - improving, given Bactrim by PCP 6 days ago, UA 7/21 showed mod Hgb, trace leuks, rare bacteria Hyponatremia  Urolithiasis - passed small stone 01/11/13, no acute problems at this time  P:   -See ID -monitor UOP -BMET in AM  ENDOCRINE A:  Hyperglycemia TSH 1.4 P:   -SSI -check A1C  GASTROINTESTINAL  A:  Morbid obesity  SUP P: -nutrition counseling -continue pantoprazole  -encourage wt loss  HEMATOLOGIC  A:  Elevated Hgb - further supports element of chronic hypoxia  P: -Heparin for DVT prophylaxis   INFECTIOUS A:  UTI AECOPD P: -see renal  -replaced Bactrim with Levaquin; total of 5 days for pulm / GU coverage  NEUROLOGY A: Hx of TIAs Chronic HAs P:  -Continue Aggrenox   Today's summary: continue abx, monitor sats & BP, continue BDs, check ECHO, encourage weight loss and smoking cessation   Leo Rod, PA-S  Canary Brim, NP-C [facility] Pgr: [phone] or [phone]    01/14/2013, 8:47 AM  Reviewed above, examined pt, and agree with assessment/plan.  57 yo male who recently quit smoking with progressive dyspnea, wheezing, and cough.  He has hx of pulmonary sarcoidosis, but CT chest findings are indicative of chronic rather that acute findings >> I don't think his  current symptoms are related to sarcoidosis.  He has hx of allergies, smoking hx, and intermittent wheezing >> this raises concern of obstructive lung disease (asthma +/- COPD) >> he reports benefit from bronchodilator therapy.  He reports snoring, witnessed apnea, and sleep disruption >> he had recent neurology evaluation and was scheduled for outpatient sleep study.  He also has chronic hypercapnia on ABG.  He likely has sleep disordered breathing with OSA and OHS.  He has history of hypertension on multiple medications, and certainly could have component of diastolic dysfunction contributing to his dyspnea.  He has chronic back problems and limited activity >> therefore he could have component of deconditioning.  Lastly, he is on ACE inhibitor and this could be contributing to upper airway irritation.  I recommend to continue ABx and bronchodilator therapy.  Will decrease solumedrol to 20 mg q12h.  He will need PFT's when he is more stable as  an outpt.  Continue supplemental oxygen to keep SpO2 > 92% >> will need to assess for home oxygen needs prior to d/c home.  He needs to ensure smoking abstinence.  Continue empiric CPAP while asleep, and reschedule outpt sleep study to further evaluate for sleep disordered breathing.  F/u Echo to assess for diastolic dysfunction and pulmonary artery pressures.  He will need further education about diet, exercise, and weight reduction.  Will defer to primary team whether he can safely be changed from lisinopril to alternative anti-hypertensive agent.  He is interested in arrange for pulmonary follow up as an outpt.  PCCM will continue to follow while he is inpatient.  Coralyn Helling, MD [facility] Pulmonary/Critical Care 01/14/2013, 11:41 AM Pager:  [phone] After 3pm call: [phone]

## 2013-01-14 NOTE — Progress Notes (Signed)
[id number]/Rhonda Davis, RN,BSN,CCM: Case management [phone] Chart reviewed and updated.  Next chart review due on 07252014. Needs for discharge at time of review:  None 

## 2013-01-14 NOTE — ED Provider Notes (Signed)
Medical screening examination/treatment/procedure(s) were performed by non-physician practitioner and as supervising physician I was immediately available for consultation/collaboration.  Tashanti Dalporto T Lynden Flemmer, MD 01/14/13 0757 

## 2013-01-14 NOTE — Progress Notes (Addendum)
[facility] PROGRESS NOTE  Scott Holden WUJ:[id number] DOB: [birth year] DOA: 01/13/2013 PCP: No primary provider on file.  Assessment/Plan: Active Problems:   Dyspnea   Chronic respiratory failure   Pulmonary sarcoidosis   HTN (hypertension)   Dyslipidemia   Chronic low back pain    1. Dyspnea/Chronic respiratory failure: Patient presented with progressive SOB over 3 months, now much worse. CXR was devoid of active disease, CTA revealed no evidence to suggest clinically relevant central, lobar or segmental sized pulmonary embolism, there was mediastinal and bilateral hilar calcified lymphadenopathy as well as mild diffuse bronchial wall thickening. Clinically and radiologically, there were no features of CHF. He is currently afebrile despite history of pyrexia up to 102, and wcc is normal. Oxygen saturation on RA was said to be in the 80s in the ED, consistent with hypoxic respiratory failure, possibly acute on chronic, improved to the 90s on O2. Etiology appears to be exacerbation/progression of sarcoidosis, although given body habitus, OSA may be a contributory factor. Managing as described below, in addition to supportive treatment with oxygen, Mucinex and bronchodilator nebulizers. ABG showed PH 7.339, PCO2 57.5, O2 57.0, Bicarb 30.1, O2 sat 88.0, consistent with hypercapnic/hypoxic respiratory failure. Fortunately, BIPAP was not needed on 01/13/13. Will start nocturnal CPAP today. Dr Sherene Sires has been requested to provide pulmonary consultation. Will arrange 2D Echocardiogram 2. Pulmonary sarcoidosis: See discussion above. Will commence Levaquin, parenteral steroids. Blood cultures will be obtained if patient spikes a fever. Outpatient pulmonary follow up will be needed.  3. HTN (hypertension): BP appears reasonably controlled. Continue pre-admission antihypertensives, and observe.  4. Dyslipidemia: Not currently on statin. Lipid profileshows TC 161, TG 133, HDL 36, LDL 98. TSH is 1.435.  5.  Chronic low back pain: Patient is under care of Dr Ethelene Hal, for this. Will manage with prn analgesics.  6. History of TIAs: On Aggrenox.  7. Chronic headaches: On Lamictal/Not problematic.  8. UTI/Prostatitis: On day# 6 of planned 2-week course of antibiotics. Now managing with Levaquin. Repeat U/A is unimpressive.  9. Urolithiasis: Passed a small stone on 01/11/13. Not problematic at this time.    Code Status: Full Code.  Family Communication:  Disposition Plan: To be determined. Will likely transfer to tele later today.    Brief narrative: This is a 57 year old male, with known history of tobacco abuse, quit about 8 weeks ago, dyslipidemia, HTN, recurrent TIAs, chronic headaches under care of Dr Joycelyn Schmid, neurologist, OA, lumbar DJD/chronic low back pain under care of Dr Ethelene Hal, s/p total right hip arthroplasty s/p right knee arthroscopy x 2, urolithiasis, pulmonary sarcoidosis, presenting with progressive SOB. According to patient, he was treated with a 7-Day course of antibiotics by his primary MD at that time, but never really seamed to improve. Over the last 3 months, he has become progressively SOB, associated with a non-productive cough. He saw his primary MD at [facility], for dysuria and urinary frequency on 01/09/13, was prescribed Bactrim DS BID (a 2-week course) for U/A-confirmed UTI and possible acute prostatitis. He has been compliant with this medication, and passed a urinary stone on 01/11/13. On 01/11/13-01/12/13, he had fevers, sometimes as high as 102, but no chills. SOB has become much worse, he is unable to catch his breath, and effort tolerance is now down to only about 10 steps. He came to the ED on 01/13/13, and was admitted for further management.   Consultants:  Dr Sandrea Hughs, Pulmonary.   Procedures:  CXR.  CTA.   Antibiotics:  Levaquin 01/13/13>>>  HPI/Subjective: Feels better. Has tenacious phlegm.   Objective: Vital signs in last 24 hours: Temp:  [98  F (36.7 C)-98.7 F (37.1 C)] 98.4 F (36.9 C) (07/22 0800) Pulse Rate:  [62-80] 71 (07/22 0741) Resp:  [11-25] 16 (07/22 0741) BP: (132-175)/(61-95) 175/86 mmHg (07/22 0741) SpO2:  [85 %-98 %] 98 % (07/22 0812) Weight:  [136.1 kg (300 lb 0.7 oz)-137.258 kg (302 lb 9.6 oz)] 136.1 kg (300 lb 0.7 oz) (07/22 0500) Weight change:  Last BM Date: 01/12/13  Intake/Output from previous day: 07/21 0701 - 07/22 0700 In: 963 [P.O.:960; I.V.:3] Out: 1801 [Urine:1801]     Physical Exam: General: Alert, communicative, fully oriented, talking in complete sentences, not short of breath at rest.  HEENT: No clinical pallor, no jaundice, no conjunctival injection or discharge. Hydration status is fair.  NECK: Supple, JVP not seen, no carotid bruits, no palpable lymphadenopathy, no palpable goiter.  CHEST: Clinically clear to auscultation, no wheezes, no crackles.  HEART: Sounds 1 and 2 heard, normal, regular, no murmurs.  ABDOMEN: Morbidly obese, soft, non-tender, no palpable organomegaly, no palpable masses, normal bowel sounds.  GENITALIA: Not examined.  LOWER EXTREMITIES: No pitting edema, palpable peripheral pulses.  MUSCULOSKELETAL SYSTEM: Unremarkable.  CENTRAL NERVOUS SYSTEM: No focal neurologic deficit on gross examination.  Lab Results:  Recent Labs  01/13/13 1103 01/14/13 0337  WBC 9.0 7.4  HGB 14.4 14.7  HCT 43.5 45.2  PLT 250 271    Recent Labs  01/13/13 1103 01/14/13 0337  NA 134* 131*  K 4.2 5.0  CL 97 93*  CO2 31 30  GLUCOSE 86 155*  BUN 11 13  CREATININE 0.95 0.93  CALCIUM 9.2 9.5   Recent Results (from the past 240 hour(s))  MRSA PCR SCREENING     Status: None   Collection Time    01/13/13  9:24 PM      Result Value Range Status   MRSA by PCR NEGATIVE  NEGATIVE Final   Comment:            The GeneXpert MRSA Assay (FDA     approved for NASAL specimens     only), is one component of a     comprehensive MRSA colonization     surveillance program. It is  not     intended to diagnose MRSA     infection nor to guide or     monitor treatment for     MRSA infections.     Studies/Results: Dg Chest 2 View  01/13/2013   *RADIOLOGY REPORT*  Clinical Data: Shortness of breath.  CHEST - 2 VIEW  Comparison: Chest x-ray 11/27/2012.  Findings: Lung volumes are normal.  No consolidative airspace disease.  No pleural effusions.  No pneumothorax.  No pulmonary nodule or mass noted.  Pulmonary vasculature and the cardiomediastinal silhouette are within normal limits.  IMPRESSION: 1. No radiographic evidence of acute cardiopulmonary disease.   Original Report Authenticated By: Trudie Reed, M.D.   Ct Angio Chest Pe W/cm &/or Wo Cm  01/13/2013   *RADIOLOGY REPORT*  Clinical Data: Shortness of breath.  Cough.  Back pain.  CT ANGIOGRAPHY CHEST  Technique:  Multidetector CT imaging of the chest using the standard protocol during bolus administration of intravenous contrast. Multiplanar reconstructed images including MIPs were obtained and reviewed to evaluate the vascular anatomy.  Contrast: OMNIPAQUE IOHEXOL 350 MG/ML SOLN  Comparison: No priors.  Findings:  Mediastinum: There is considerable respiratory motion on today's examination, which limits  sensitivity for detection of small subsegmental sized pulmonary emboli.  With this limitation in mind, there is no definite to clinically relevant central, lobar or segmental sized embolus noted. Heart size is borderline enlarged. There is no significant pericardial fluid, thickening or pericardial calcification. There is atherosclerosis of the thoracic aorta, the great vessels of the mediastinum and the coronary arteries, including calcified atherosclerotic plaque in the left anterior descending and right coronary arteries. Numerous mediastinal and bilateral hilar lymph nodes are borderline to mildly enlarged and extensively calcified, similar to the remote prior examination.  Esophagus is unremarkable in appearance.No  acute abnormality of the thoracic aorta; specifically, no aneurysm or dissection.  Lungs/Pleura: No acute consolidative airspace disease.  No pleural effusions.  No suspicious appearing pulmonary nodules or masses. There is a small cluster of fibrocavitary blebs in the right apex which are unchanged compared to the prior study and most compatible with areas of scarring.  Mild diffuse bronchial wall thickening is unchanged.  Upper Abdomen: Unremarkable.  Musculoskeletal: There are no aggressive appearing lytic or blastic lesions noted in the visualized portions of the skeleton.  IMPRESSION: 1.  Despite the limitations of today's examination there is no evidence to suggest clinically relevant central, lobar or segmental sized pulmonary embolism. 2.  Mediastinal and bilateral hilar calcified lymphadenopathy redemonstrated, compatible with the reported clinical history of sarcoidosis. 3.  Mild diffuse bronchial wall thickening.  This appears be chronic in this patient. 4.  No acute abnormality of the thoracic aorta to account for the patient's history of back pain.   Original Report Authenticated By: Trudie Reed, M.D.    Medications: Scheduled Meds: . ipratropium  0.5 mg Nebulization Q6H   And  . albuterol  2.5 mg Nebulization Q6H  . diltiazem  240 mg Oral Daily  . dipyridamole-aspirin  1 capsule Oral BID  . guaiFENesin  600 mg Oral BID  . heparin  5,000 Units Subcutaneous Q8H  . hydrochlorothiazide  25 mg Oral Daily  . lamoTRIgine  50 mg Oral BID  . levofloxacin (LEVAQUIN) IV  750 mg Intravenous Q24H  . lisinopril  20 mg Oral Daily  . loratadine  10 mg Oral Daily  . methylPREDNISolone (SOLU-MEDROL) injection  40 mg Intravenous Q6H  . pantoprazole  40 mg Oral Daily  . sodium chloride  3 mL Intravenous Q12H  . sodium chloride  3 mL Intravenous Q12H   Continuous Infusions:  PRN Meds:.sodium chloride, acetaminophen, acetaminophen, albuterol, guaiFENesin-dextromethorphan, HYDROmorphone (DILAUDID)  injection, sodium chloride, traMADol    LOS: 1 day   Kinsler Soeder,CHRISTOPHER  [facility] Pager [phone]. If 8PM-8AM, please contact night-coverage at [email or web address], password [facility] 01/14/2013, 8:53 AM  LOS: 1 day

## 2013-01-15 ENCOUNTER — Encounter: Payer: Self-pay | Admitting: Neurology

## 2013-01-15 ENCOUNTER — Inpatient Hospital Stay (HOSPITAL_COMMUNITY): Payer: Medicaid Other

## 2013-01-15 DIAGNOSIS — J9610 Chronic respiratory failure, unspecified whether with hypoxia or hypercapnia: Secondary | ICD-10-CM

## 2013-01-15 DIAGNOSIS — J99 Respiratory disorders in diseases classified elsewhere: Secondary | ICD-10-CM

## 2013-01-15 DIAGNOSIS — J441 Chronic obstructive pulmonary disease with (acute) exacerbation: Secondary | ICD-10-CM

## 2013-01-15 LAB — CBC
HCT: 42.8 % (ref 39.0–52.0)
Hemoglobin: 14.5 g/dL (ref 13.0–17.0)
RDW: 14.7 % (ref 11.5–15.5)
WBC: 17.1 10*3/uL — ABNORMAL HIGH (ref 4.0–10.5)

## 2013-01-15 LAB — BASIC METABOLIC PANEL
BUN: 21 mg/dL (ref 6–23)
Chloride: 93 mEq/L — ABNORMAL LOW (ref 96–112)
GFR calc Af Amer: 88 mL/min — ABNORMAL LOW (ref >90)
GFR calc non Af Amer: 76 mL/min — ABNORMAL LOW (ref >90)
Glucose, Bld: 165 mg/dL — ABNORMAL HIGH (ref 70–99)
Potassium: 5.1 mEq/L (ref 3.5–5.1)
Sodium: 130 mEq/L — ABNORMAL LOW (ref 135–145)

## 2013-01-15 MED ORDER — LOSARTAN POTASSIUM 50 MG PO TABS: 50.0000 mg | ORAL_TABLET | Freq: Every day | ORAL | Status: DC

## 2013-01-15 MED ORDER — PREDNISONE 20 MG PO TABS
40.0000 mg | ORAL_TABLET | Freq: Every day | ORAL | Status: DC
  Filled 2013-01-15: qty 2

## 2013-01-15 MED ORDER — LEVOFLOXACIN 750 MG PO TABS: 750.0000 mg | ORAL_TABLET | Freq: Every day | ORAL | Status: DC

## 2013-01-15 MED ORDER — HYDROMORPHONE HCL 4 MG PO TABS: 4.0000 mg | ORAL_TABLET | ORAL | Status: DC | PRN

## 2013-01-15 MED ORDER — IPRATROPIUM-ALBUTEROL 0.5-2.5 (3) MG/3ML IN SOLN: 3.0000 mL | RESPIRATORY_TRACT | Status: AC | PRN

## 2013-01-15 MED ORDER — GUAIFENESIN ER 600 MG PO TB12: 600.0000 mg | ORAL_TABLET | Freq: Two times a day (BID) | ORAL | Status: DC

## 2013-01-15 MED ORDER — PREDNISONE 20 MG PO TABS
40.0000 mg | ORAL_TABLET | Freq: Every day | ORAL | Status: DC
  Administered 2013-01-15: 40 mg via ORAL
  Filled 2013-01-15 (×2): qty 2

## 2013-01-15 MED ORDER — PREDNISONE 10 MG PO TABS: ORAL_TABLET | ORAL | Status: DC

## 2013-01-15 MED ORDER — HYDROCHLOROTHIAZIDE 25 MG PO TABS: 25.0000 mg | ORAL_TABLET | Freq: Every day | ORAL | Status: DC

## 2013-01-15 MED ORDER — LEVOFLOXACIN 750 MG PO TABS
750.0000 mg | ORAL_TABLET | Freq: Every day | ORAL | Status: DC
  Administered 2013-01-15: 750 mg via ORAL
  Filled 2013-01-15: qty 1

## 2013-01-15 MED ORDER — UNABLE TO FIND: Status: DC

## 2013-01-15 NOTE — Progress Notes (Addendum)
Pt's follow up appointments in EPIC. Pt selected [facility] for DME. Sleep study with [facility] Sleep study [phone], January 23, 2013, 900 PM. Pt also dc home with CPAP/verbal settings from Dr. Brien Few,  Auto rate 5-20. Spoke with pt and he understands sleep study is at 900 PM January 23, 2013.

## 2013-01-15 NOTE — Progress Notes (Addendum)
PULMONARY  / CRITICAL CARE MEDICINE  Name: Scott Holden MRN: [id number] DOB: [birth year]    ADMISSION DATE:  01/13/2013 CONSULTATION DATE:  01/14/2013  REFERRING MD :  Ricke Hey PRIMARY SERVICE:  TRH  CHIEF COMPLAINT:  SOB x 3 months  BRIEF PATIENT DESCRIPTION: [name] with hx of kidney calculi, HTN, TIAs, arthritis, headache, sarcoidosis admitted 7/21 with ~ 1 wk of non-productive cough, and progressive shortness of breath.   SIGNIFICANT EVENTS / STUDIES:  7/21 CXR>>>neg 7/21 CT angio chest>>> mediastinal and bil hilar calcified lymphadenopathy, mild diffuse bronchial wall thickening  7/22 ECHO>>> L ventricle: mildly dilated, systolic function normal, EF 55-60%, grade 1 diastolic dysfunction; aortic valve: mild regurg  LINES / TUBES:   CULTURES: MRSA>>>neg  ANTIBIOTICS: 7/21 levaquin>>>   SUBJECTIVE: Alert and oriented, sitting upright in bed, responds to questions and commands. States SOB and cough improving. He has had brown-colored sputum production. Sleep improved on CPAP.  VITAL SIGNS: Temp:  [97.7 F (36.5 C)-98.5 F (36.9 C)] 98 F (36.7 C) (07/23 0525) Pulse Rate:  [62-101] 62 (07/23 0525) Resp:  [16-18] 18 (07/23 0525) BP: (104-136)/(54-79) 104/54 mmHg (07/23 0525) SpO2:  [91 %-94 %] 91 % (07/23 0525)  Intake/Output     07/22 0701 - 07/23 0700 07/23 0701 - 07/24 0700   P.O. 900    I.V. (mL/kg) 80 (0.6)    IV Piggyback 300    Total Intake(mL/kg) 1280 (9.4)    Urine (mL/kg/hr) 3325 (1)    Stool 1 (0)    Total Output 3326     Net -2046            PHYSICAL EXAMINATION: General:  [name], NAD Neuro:  AAOx4, no focal neuro deficits Cardiovascular:  RRR, no m/r/g Lungs:  resp's even bilaterally, CTA, on nasal cannula 2L Abdomen:  BS+, soft, non-tender, ND, no masses Extremities: no edema Skin:  Warm, no cyanosis   Recent Labs Lab 01/13/13 1103 01/14/13 0337 01/15/13 0454  NA 134* 131* 130*  K 4.2 5.0 5.1  CL 97 93* 93*  CO2 31 30  29   BUN 11 13 21   CREATININE 0.95 0.93 1.06  GLUCOSE 86 155* 165*    Recent Labs Lab 01/13/13 1103 01/14/13 0337 01/15/13 0454  HGB 14.4 14.7 14.5  HCT 43.5 45.2 42.8  WBC 9.0 7.4 17.1*  PLT 250 271 265   CXR: Shallow inspiration, developing hazy opacities in lung bases suggests developing pna or atelectasis   ASSESSMENT / PLAN: PULMONARY A: Dyspnea / chronic respiratory failure - 57 y/o with cough, hypoxia, dyspnea, hx of sarcoidosis.  Dyspnea is likely multifactorial in setting of acute exacerbation of COPD / obstructive lung dz, OSA / OHS, and deconditioning.  Other considerations to include diastolic dysfunction, pulm hypertension and ADR of medication (lisinopril).   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 Craige Cotta arranged -pulmonary hygiene / IS -repeat PA / Lateral in am -ambulatory desaturation prior to discharge  CARDIOVASCULAR A: Hypertension - on multidrug regimen at home, including ACE-I Dyslipidemia - not currently on meds, lipid panel WNL except low HDL (36) Grade 1 diastolic dysfunction on ECHO 7/22 - EF 55-60%, aortic regurg CAD - noted on CTA chest.  Troponin neg P: -continue to monitor BP -continue HTN meds -encouraged patient to review the DASH Diet   RENAL  A: UTI / Prostatitis - improving, given Bactrim by PCP 7 days ago, UA 7/21 showed mod Hgb, trace leuks, rare bacteria Hyponatremia  Urolithiasis - passed small stone 01/11/13, no acute problems at this time  P:   -See ID -monitor UOP -BMET in AM  ENDOCRINE A:  Hyperglycemia High risk for diabetes mellitus - A1C 6.3 TSH 1.4 P:  -SSI  GASTROINTESTINAL  A:  Morbid obesity   SUP P: -nutrition counseling -continue pantoprazole  -encourage wt loss  HEMATOLOGIC  A:  Elevated Hgb - further supports element of chronic hypoxia  P: -Heparin for DVT prophylaxis   INFECTIOUS A:  UTI AECOPD P: -see renal  -replaced Bactrim with Levaquin; total of 5 days for pulm / GU coverage -monitor fever curve/ leukocytosis  NEUROLOGY A: Hx of TIAs Chronic HAs P:  -Continue Aggrenox   Today's summary: continue abx, monitor sats & BP, continue BDs, encourage weight loss and smoking cessation, cxr in AM  Leo Rod, PA-S  Canary Brim, NP-C [facility] Pgr: [phone] or [phone]   Reviewed above, examine pt, and agree with assessment/plan.  Continue bronchodilator therapy.  Wean off prednisone as tolerated for acute bronchitis.  Abx per primary team.  He will follow up with neurology to further assess sleep apnea.  He will need to be assessed for home oxygen prior to d/c >> concern he could also have obesity hypoventilation syndrome.  Will need PFT's as outpt to further assess for obstructive lung disease.  Encouraged him to continue with smoking abstinence.  Continue to monitor off ACE inhibitor.  Needs education about weight reduction.  PCCM will sign off.  Follow up appointment with pulmonary as outpt has been scheduled for 01/24/13.  Please call if additional help needed while pt is in hospital.  Coralyn Helling, MD [facility] Pulmonary/Critical Care 01/15/2013, 11:31 AM Pager:  [phone] After 3pm call: [phone]

## 2013-01-15 NOTE — Progress Notes (Signed)
Patient's WBC's jumped from 7.4 yesterday to 17.1 this morning, sent FYI text page to floor coverage.  Will also pass information to day shift nurse to follow up on.  Ernesta Amble, RN

## 2013-01-15 NOTE — Progress Notes (Signed)
RT placed patient on CPAP. Settings are Auto Titrate. Patient had an appointment to have a sleep study done but was unable to go due to patient having to come to the hospital. Order for CPAP was given per MD. Patient is tolerating well at this time.

## 2013-01-15 NOTE — Discharge Summary (Signed)
Physician Discharge Summary  Scott Holden AVW:[id number] DOB: 09-28-1955 DOA: 01/13/2013  PCP: No primary provider on file.  Admit date: 01/13/2013 Discharge date: 01/15/2013  Time spent: 40 minutes  Recommendations for Outpatient Follow-up:  1. Follow up with primary MD. 2. Follow up with primary neurologist.  3. Reschedule Polysomnogram ASAP. 4. Follow up with Dr Coralyn Helling, pulmonologist on 01/24/13. 5. Outpatient PFT's as outpt to further assess for obstructive lung disease   Discharge Diagnoses:  Active Problems:   Dyspnea   Chronic respiratory failure   Pulmonary sarcoidosis   HTN (hypertension)   Dyslipidemia   Chronic low back pain   Bronchitis, chronic obstructive, with exacerbation   Sleep disorder breathing   Discharge Condition: Satisfactory.  Diet recommendation: Heart-Healthy.   Filed Weights   01/13/13 1449 01/13/13 2028 01/14/13 0500  Weight: 137.258 kg (302 lb 9.6 oz) 137 kg (302 lb 0.5 oz) 136.1 kg (300 lb 0.7 oz)    History of present illness:  This is a 57 year old male, with known history of tobacco abuse, quit about 8 weeks ago, dyslipidemia, HTN, recurrent TIAs, chronic headaches under care of Dr Joycelyn Schmid, neurologist, OA, lumbar DJD/chronic low back pain under care of Dr Ethelene Hal, s/p total right hip arthroplasty s/p right knee arthroscopy x 2, urolithiasis, pulmonary sarcoidosis, presenting with progressive SOB. According to patient, he was treated with a 7-Day course of antibiotics by his primary MD at that time, but never really seamed to improve. Over the last 3 months, he has become progressively SOB, associated with a non-productive cough. He saw his primary MD at [facility], for dysuria and urinary frequency on 01/09/13, was prescribed Bactrim DS BID (a 2-week course) for U/A-confirmed UTI and possible acute prostatitis. He has been compliant with this medication, and passed a urinary stone on 01/11/13. On 01/11/13-01/12/13, he had fevers, sometimes  as high as 102, but no chills. SOB has become much worse, he is unable to catch his breath, and effort tolerance is now down to only about 10 steps. He came to the ED on 01/13/13, and was admitted for further management.   Hospital Course:  1. Dyspnea/Chronic respiratory failure: Patient presented with progressive SOB over 3 months, now much worse. CXR was devoid of active disease, CTA revealed no evidence to suggest clinically relevant central, lobar or segmental sized pulmonary embolism, there was mediastinal and bilateral hilar calcified lymphadenopathy as well as mild diffuse bronchial wall thickening. Clinically and radiologically, there were no features of CHF. He was afebrile, despite history of pyrexia up to 102, and wcc was normal. Oxygen saturation on RA was said to be in the 80s in the ED, consistent with hypoxic respiratory failure, possibly acute on chronic, improved to the 90s on O2. ABG showed PH 7.339, PCO2 57.5, O2 57.0, Bicarb 30.1, O2 sat 88.0, consistent with hypercapnic/hypoxic respiratory failure. 2D Echocardiogram showed mildly dilated LV cavity size, mild LVH, EF of 55% to 60% and no regional wall motion abnormalities. Grade 1 diastolic dysfunction. Dr Coralyn Helling provided pulmonary consultation, and has opined that  There appears to be no evidence for exacerbation/progression of sarcoidosis, and likely etiologies, are COPD exacerbation, OSA/OHS. Patient was managed with Steroids, Levaquin, Mucinex, bronchodilator nebulizers and oxygen supplementation, with rather dramatic clinical response. Patient was already scheduled for outpatient PSG on 01/14/13, but this was pre-empted by his hospitalization, and will have to be rescheduled. He was empirically commenced on nocturnal CPAP, Auto-titrated, effective 01/14/13. He was transitioned to oral Prednisone taper from 7//23/14,  and will conclude Levaquin on 02/22/13. Follow up with Dr Craige Cotta, as well as outpatient PFT, has been arranged. Leukocytosis  noted on 01/15/13, is attributable to steroids. Patient O2 saturation at rest was 96% on 2L. Saturation on room air was 93% at rest. After walking 100 feet down the hallway, O2 saturation was 92% on room air. Patient at rest on side of bed, saturation was 92%. Remained on room air, without any discomfort.  2. Pulmonary sarcoidosis: See discussion above.  3. HTN (hypertension): BP was managed with ARB and Cardizem CD, with satisfactory control. ACE-I was discontinued, secondary to cough.  4. Dyslipidemia: Not currently on statin. Lipid profile showed TC 161, TG 133, HDL 36, LDL 98. TSH is 1.435. Patient has been reassured accordingly.  5. Chronic low back pain: Patient is under care of Dr Ethelene Hal, for this. Managed with prn analgesics. Not problematic.  6. History of TIAs: On Aggrenox.  7. Chronic headaches: On Lamictal/Not problematic.  8. UTI/Prostatitis: As of 01/15/13, patient had completed 7 days of a planned 14 day course of antibiotics. Antibiotics were changed to Levaquin (he was on Bactrim DS pre-admission) on admission, and will be concluded on 01/22/13. Of note repeat U/A of 01/13/13, was negative.   9. Urolithiasis: Passed a small stone on 01/11/13. Not problematic or symptomatic at this time.    Procedures:  See Below.   2D Echocardiogram.   Consultations:  Dr Coralyn Helling, pulmonologist.   Discharge Exam: Filed Vitals:   01/14/13 2133 01/15/13 0525 01/15/13 0916 01/15/13 1120  BP: 129/74 104/54  138/70  Pulse: 82 62    Temp: 98.3 F (36.8 C) 98 F (36.7 C)    TempSrc: Oral Oral    Resp: 18 18    Height:      Weight:      SpO2: 93% 91% 93%     General: Alert, communicative, fully oriented, talking in complete sentences, not short of breath at rest.  HEENT: No clinical pallor, no jaundice, no conjunctival injection or discharge. Hydration status is fair.  NECK: Supple, JVP not seen, no carotid bruits, no palpable lymphadenopathy, no palpable goiter.  CHEST: Clinically clear  to auscultation, no wheezes, no crackles.  HEART: Sounds 1 and 2 heard, normal, regular, no murmurs.  ABDOMEN: Morbidly obese, soft, non-tender, no palpable organomegaly, no palpable masses, normal bowel sounds.  GENITALIA: Not examined.  LOWER EXTREMITIES: No pitting edema, palpable peripheral pulses.  MUSCULOSKELETAL SYSTEM: Unremarkable.  CENTRAL NERVOUS SYSTEM: No focal neurologic deficit on gross examination.  Discharge Instructions      Discharge Orders   Future Appointments Provider Department Dept Phone   01/24/2013 10:00 AM Coralyn Helling, MD [facility] [phone]   04/03/2013 3:30 PM Nilda Riggs, NP [facility] [phone]   Future Orders Complete By Expires     Diet - low sodium heart healthy  As directed     Increase activity slowly  As directed         Medication List    STOP taking these medications       lisinopril-hydrochlorothiazide 20-25 MG per tablet  Commonly known as:  PRINZIDE,ZESTORETIC     sulfamethoxazole-trimethoprim 800-160 MG per tablet  Commonly known as:  BACTRIM DS      TAKE these medications       albuterol 108 (90 BASE) MCG/ACT inhaler  Commonly known as:  PROVENTIL HFA;VENTOLIN HFA  Inhale 1-2 puffs into the lungs every 6 (six) hours as needed for wheezing.  diltiazem 240 MG 24 hr capsule  Commonly known as:  DILACOR XR  Take 240 mg by mouth daily.     dipyridamole-aspirin 200-25 MG per 12 hr capsule  Commonly known as:  AGGRENOX  Take 1 capsule by mouth 2 (two) times daily.     guaiFENesin 600 MG 12 hr tablet  Commonly known as:  MUCINEX  Take 1 tablet (600 mg total) by mouth 2 (two) times daily.     hydrochlorothiazide 25 MG tablet  Commonly known as:  HYDRODIURIL  Take 1 tablet (25 mg total) by mouth daily.     HYDROmorphone 4 MG tablet  Commonly known as:  DILAUDID  Take 1 tablet (4 mg total) by mouth every 4 (four) hours as needed for pain.     ipratropium-albuterol 0.5-2.5 (3)  MG/3ML Soln  Commonly known as:  DUONEB  Take 3 mLs by nebulization every 4 (four) hours as needed (For shortness of breath).     lamoTRIgine 25 MG tablet  Commonly known as:  LAMICTAL  Take 2 tablets (50 mg total) by mouth 2 (two) times daily.     levofloxacin 750 MG tablet  Commonly known as:  LEVAQUIN  Take 1 tablet (750 mg total) by mouth daily.     loratadine 10 MG tablet  Commonly known as:  CLARITIN  Take 10 mg by mouth daily.     losartan 50 MG tablet  Commonly known as:  COZAAR  Take 1 tablet (50 mg total) by mouth daily.  Start taking on:  01/16/2013     omeprazole 40 MG capsule  Commonly known as:  PRILOSEC  Take 40 mg by mouth daily.     predniSONE 10 MG tablet  Commonly known as:  DELTASONE  Take 40 mg daily for 3 days, then 30 mg daily for 3 days, then 20 mg daily for 3 days, then 10 mg daily for 3 days, then stop.     traMADol 50 MG tablet  Commonly known as:  ULTRAM  Take 50 mg by mouth every 6 (six) hours as needed for pain.     UNABLE TO FIND  Bronchodilator Nebulizer Machine x 1.       Allergies  Allergen Reactions  . Morphine Hives, Itching and Rash   Follow-up Information   Follow up with SOOD,VINEET, MD On 01/24/2013. (Appt at 10:00 AM.  Will need PFT's & Sleep / CPAP follow up)    Contact information:   [address] [place] [address] [phone]       Schedule an appointment as soon as possible for a visit with Joycelyn Schmid, MD.   Contact information:   [address] [place] [address] [phone]       Schedule an appointment as soon as possible for a visit with RAMOS,RICHARD D, MD.   Contact information:   [address] [place] [address] [phone]       Please follow up. (Follow up with primary MD. )        The results of significant diagnostics from this hospitalization (including imaging, microbiology, ancillary and laboratory) are listed below for reference.     Significant Diagnostic Studies: Dg Chest 2 View  01/13/2013   *RADIOLOGY REPORT*  Clinical Data: Shortness of breath.  CHEST - 2 VIEW  Comparison: Chest x-ray 11/27/2012.  Findings: Lung volumes are normal.  No consolidative airspace disease.  No pleural effusions.  No pneumothorax.  No pulmonary nodule or mass noted.  Pulmonary vasculature and the cardiomediastinal silhouette are within normal limits.  IMPRESSION: 1. No radiographic evidence of acute cardiopulmonary disease.   Original Report Authenticated By: Trudie Reed, M.D.   Ct Angio Chest Pe W/cm &/or Wo Cm  01/13/2013   *RADIOLOGY REPORT*  Clinical Data: Shortness of breath.  Cough.  Back pain.  CT ANGIOGRAPHY CHEST  Technique:  Multidetector CT imaging of the chest using the standard protocol during bolus administration of intravenous contrast. Multiplanar reconstructed images including MIPs were obtained and reviewed to evaluate the vascular anatomy.  Contrast: OMNIPAQUE IOHEXOL 350 MG/ML SOLN  Comparison: No priors.  Findings:  Mediastinum: There is considerable respiratory motion on today's examination, which limits sensitivity for detection of small subsegmental sized pulmonary emboli.  With this limitation in mind, there is no definite to clinically relevant central, lobar or segmental sized embolus noted. Heart size is borderline enlarged. There is no significant pericardial fluid, thickening or pericardial calcification. There is atherosclerosis of the thoracic aorta, the great vessels of the mediastinum and the coronary arteries, including calcified atherosclerotic plaque in the left anterior descending and right coronary arteries. Numerous mediastinal and bilateral hilar lymph nodes are borderline to mildly enlarged and extensively calcified, similar to the remote prior examination.  Esophagus is unremarkable in appearance.No acute abnormality of the thoracic aorta; specifically, no aneurysm or dissection.  Lungs/Pleura: No acute  consolidative airspace disease.  No pleural effusions.  No suspicious appearing pulmonary nodules or masses. There is a small cluster of fibrocavitary blebs in the right apex which are unchanged compared to the prior study and most compatible with areas of scarring.  Mild diffuse bronchial wall thickening is unchanged.  Upper Abdomen: Unremarkable.  Musculoskeletal: There are no aggressive appearing lytic or blastic lesions noted in the visualized portions of the skeleton.  IMPRESSION: 1.  Despite the limitations of today's examination there is no evidence to suggest clinically relevant central, lobar or segmental sized pulmonary embolism. 2.  Mediastinal and bilateral hilar calcified lymphadenopathy redemonstrated, compatible with the reported clinical history of sarcoidosis. 3.  Mild diffuse bronchial wall thickening.  This appears be chronic in this patient. 4.  No acute abnormality of the thoracic aorta to account for the patient's history of back pain.   Original Report Authenticated By: Trudie Reed, M.D.   Dg Chest Port 1 View  01/15/2013   *RADIOLOGY REPORT*  Clinical Data: Elevated white count and shortness of breath. History of sarcoidosis.  PORTABLE CHEST - 1 VIEW  Comparison: 01/13/2013  Findings: Shallow inspiration.  Heart size and pulmonary vascularity are normal for inspiratory effort.  Increasing hazy opacities in the lung bases since previous study may represent developing pneumonia or atelectasis.  No blunting of costophrenic angles.  No pneumothorax.  Mediastinal contours appear intact. Tortuous aorta.  IMPRESSION: Shallow inspiration.  Developing hazy opacities in the lung bases suggest developing pneumonia or atelectasis.   Original Report Authenticated By: Burman Nieves, M.D.    Microbiology: Recent Results (from the past 240 hour(s))  MRSA PCR SCREENING     Status: None   Collection Time    01/13/13  9:24 PM      Result Value Range Status   MRSA by PCR NEGATIVE  NEGATIVE  Final   Comment:            The GeneXpert MRSA Assay (FDA     approved for NASAL specimens     only), is one component of a     comprehensive MRSA colonization  surveillance program. It is not     intended to diagnose MRSA     infection nor to guide or     monitor treatment for     MRSA infections.     Labs: Basic Metabolic Panel:  Recent Labs Lab 01/13/13 1103 01/14/13 0337 01/15/13 0454  NA 134* 131* 130*  K 4.2 5.0 5.1  CL 97 93* 93*  CO2 31 30 29   GLUCOSE 86 155* 165*  BUN 11 13 21   CREATININE 0.95 0.93 1.06  CALCIUM 9.2 9.5 9.8   Liver Function Tests:  Recent Labs Lab 01/14/13 0337  AST 19  ALT 15  ALKPHOS 72  BILITOT 0.3  PROT 7.2  ALBUMIN 3.6   No results found for this basename: LIPASE, AMYLASE,  in the last 168 hours No results found for this basename: AMMONIA,  in the last 168 hours CBC:  Recent Labs Lab 01/13/13 1103 01/14/13 0337 01/15/13 0454  WBC 9.0 7.4 17.1*  NEUTROABS 4.5  --   --   HGB 14.4 14.7 14.5  HCT 43.5 45.2 42.8  MCV 89.3 89.5 88.4  PLT 250 271 265   Cardiac Enzymes: No results found for this basename: CKTOTAL, CKMB, CKMBINDEX, TROPONINI,  in the last 168 hours BNP: BNP (last 3 results) No results found for this basename: PROBNP,  in the last 8760 hours CBG: No results found for this basename: GLUCAP,  in the last 168 hours     Signed:  Nolie Bignell,CHRISTOPHER  [facility] 01/15/2013, 12:58 PM

## 2013-01-15 NOTE — Progress Notes (Signed)
Nutrition Education Note  RD consulted for nutrition education regarding DASH diet and Morbid Obesity  RD provided "Heart Healthy Nutrition Therapy" handout from the Academy of [employer] and [employer]etics. Reviewed patient's dietary recall. Provided examples on ways to decrease sodium and fat intake in diet. Discouraged intake of processed foods and use of salt shaker. Encouraged fresh fruits and vegetables as well as whole grain sources of carbohydrates to maximize fiber intake. Additionally, provided "Weight Loss Tips" and 1800-2200 calorie menus from the Academy of Nutrition and Dietetics.Teach back method used. Pt feels that he can increase his intake of fruits and vegetables, eat more whole grains, decrease his intake of meat, and bake meats instead of fry them. Pt voiced interest in seeing an outpatient nutritionist. Encouraged pt to obtain referral from his PCP. Expect good compliance.  Body mass index is 43.05 kg/(m^2). Pt meets criteria for Morbid Obesity based on current BMI.  Current diet order is Heart Healthy, patient is consuming approximately 100% of meals at this time. Labs and medications reviewed. No further nutrition interventions warranted at this time. RD contact information provided. If additional nutrition issues arise, please re-consult RD.  Ian Malkin RD, LDN Inpatient Clinical Dietitian Pager: [phone] After Hours Pager: [phone]

## 2013-01-15 NOTE — Progress Notes (Signed)
Patient at rest was 96% on 2L. Sat on room air was 93% at rest. After 100 feet of walking down the hallway, oxygen saturation was 92% on room air. Patient at rest on side of bed saturation was 92%. Remained on room air. Setzer, Don Broach

## 2013-01-23 ENCOUNTER — Telehealth: Payer: Self-pay | Admitting: Pulmonary Disease

## 2013-01-23 ENCOUNTER — Ambulatory Visit (INDEPENDENT_AMBULATORY_CARE_PROVIDER_SITE_OTHER): Payer: Medicaid Other | Admitting: Neurology

## 2013-01-23 DIAGNOSIS — G4733 Obstructive sleep apnea (adult) (pediatric): Secondary | ICD-10-CM

## 2013-01-23 DIAGNOSIS — R9431 Abnormal electrocardiogram [ECG] [EKG]: Secondary | ICD-10-CM

## 2013-01-23 NOTE — Telephone Encounter (Signed)
Pt aware.

## 2013-01-23 NOTE — Telephone Encounter (Signed)
Dr. Craige Cotta since pt is having sleep study tonight does he need to r/s his appt scheduled for  tomorrow? Please advise thanks

## 2013-01-23 NOTE — Telephone Encounter (Signed)
Please inform him that he needs to keep appointment for tomorrow to re-assess his respiratory status after recent hospital admission.  I can call him later when his sleep study is reviewed.

## 2013-01-24 ENCOUNTER — Ambulatory Visit (INDEPENDENT_AMBULATORY_CARE_PROVIDER_SITE_OTHER): Payer: Medicaid Other | Admitting: Pulmonary Disease

## 2013-01-24 ENCOUNTER — Encounter: Payer: Self-pay | Admitting: Pulmonary Disease

## 2013-01-24 VITALS — BP 118/78 | HR 90 | Temp 97.4°F | Ht 70.0 in | Wt 299.0 lb

## 2013-01-24 DIAGNOSIS — IMO0001 Reserved for inherently not codable concepts without codable children: Secondary | ICD-10-CM

## 2013-01-24 DIAGNOSIS — G4730 Sleep apnea, unspecified: Secondary | ICD-10-CM

## 2013-01-24 DIAGNOSIS — D869 Sarcoidosis, unspecified: Secondary | ICD-10-CM

## 2013-01-24 DIAGNOSIS — J9610 Chronic respiratory failure, unspecified whether with hypoxia or hypercapnia: Secondary | ICD-10-CM

## 2013-01-24 DIAGNOSIS — G478 Other sleep disorders: Secondary | ICD-10-CM

## 2013-01-24 DIAGNOSIS — J42 Unspecified chronic bronchitis: Secondary | ICD-10-CM

## 2013-01-24 DIAGNOSIS — J99 Respiratory disorders in diseases classified elsewhere: Secondary | ICD-10-CM

## 2013-01-24 DIAGNOSIS — J309 Allergic rhinitis, unspecified: Secondary | ICD-10-CM

## 2013-01-24 DIAGNOSIS — D860 Sarcoidosis of lung: Secondary | ICD-10-CM

## 2013-01-24 DIAGNOSIS — J449 Chronic obstructive pulmonary disease, unspecified: Secondary | ICD-10-CM

## 2013-01-24 MED ORDER — LORATADINE 10 MG PO TABS: 10.0000 mg | ORAL_TABLET | Freq: Every day | ORAL | Status: DC | PRN

## 2013-01-24 NOTE — Progress Notes (Signed)
Chief Complaint  Patient presents with  . Sleep Consult    Epworth: 14.    History of Present Illness: Scott Holden is a 57 y.o. male former smoker with dyspnea likely from chronic bronchitis, OSA/OHS, and diastolic dysfunction.  He has hx of pulmonary sarcoidosis.  Has hoarseness from prior ACE inhibitor use >> better after d/c ACE inhibitor.  His breathing has been doing better since his hospital stay.  He completed antibiotics.  He has two more days of prednisone left.  He has been using his nebulizer three times per day >> he does not feel like he needs it this often, but was told to do this.  He is not having cough, wheeze, sputum, or chest tightness.  He did not need oxygen after hospital stay.    He had sleep study on 01/23/13 at [facility].  He was put on CPAP in the second half of the study.  He has not heard about final results yet.  Tests: CT chest 01/13/13 >> b/l mediastinal/hilar LAN with calcification, mild diffuse bronchial wall thickening  ABG 01/13/13 >> pH 7.33, PaCO2 57.5, PaO2 57 ECHO 01/14/13 >> EF 55 to 60%, grade 1 diastolic dysfx, mild AI  Scott Holden  has a past medical history of Kidney calculi; Hypertension; History of TIAs; Arthritis; Back pain; Unspecified transient cerebral ischemia; HA (headache); and Syncope and collapse.  Scott Holden  has past surgical history that includes Total hip arthroplasty; Joint replacement; Knee surgery; and Hernia repair (2003).  Prior to Admission medications   Medication Sig Start Date End Date Taking? Authorizing Provider  albuterol (PROVENTIL HFA;VENTOLIN HFA) 108 (90 BASE) MCG/ACT inhaler Inhale 1-2 puffs into the lungs every 6 (six) hours as needed for wheezing. 11/27/12  Yes Derwood Kaplan, MD  diltiazem (DILACOR XR) 240 MG 24 hr capsule Take 240 mg by mouth daily.   Yes Historical Provider, MD  dipyridamole-aspirin (AGGRENOX) 200-25 MG per 12 hr capsule Take 1 capsule by mouth 2 (two) times daily.   Yes Historical  Provider, MD  guaiFENesin (MUCINEX) 600 MG 12 hr tablet Take 1 tablet (600 mg total) by mouth 2 (two) times daily. 01/15/13  Yes Laveda Norman, MD  hydrochlorothiazide (HYDRODIURIL) 25 MG tablet Take 1 tablet (25 mg total) by mouth daily. 01/15/13  Yes Laveda Norman, MD  HYDROmorphone (DILAUDID) 4 MG tablet Take 1 tablet (4 mg total) by mouth every 4 (four) hours as needed for pain. 01/15/13  Yes Laveda Norman, MD  ipratropium-albuterol (DUONEB) 0.5-2.5 (3) MG/3ML SOLN Take 3 mLs by nebulization every 4 (four) hours as needed (For shortness of breath). 01/15/13  Yes Laveda Norman, MD  lamoTRIgine (LAMICTAL) 25 MG tablet Take 2 tablets (50 mg total) by mouth 2 (two) times daily. 01/07/13  Yes Nilda Riggs, NP  levofloxacin (LEVAQUIN) 750 MG tablet Take 1 tablet (750 mg total) by mouth daily. 01/15/13  Yes Laveda Norman, MD  loratadine (CLARITIN) 10 MG tablet Take 10 mg by mouth daily.   Yes Historical Provider, MD  losartan (COZAAR) 50 MG tablet Take 1 tablet (50 mg total) by mouth daily. 01/16/13  Yes Laveda Norman, MD  omeprazole (PRILOSEC) 40 MG capsule Take 40 mg by mouth daily.   Yes Historical Provider, MD  predniSONE (DELTASONE) 10 MG tablet Take 40 mg daily for 3 days, then 30 mg daily for 3 days, then 20 mg daily for 3 days, then 10 mg daily for 3 days, then stop.  01/15/13  Yes Laveda Norman, MD  traMADol (ULTRAM) 50 MG tablet Take 50 mg by mouth every 6 (six) hours as needed for pain.   Yes Historical Provider, MD  UNABLE TO FIND Bronchodilator Nebulizer Machine x 1. 01/15/13  Yes Laveda Norman, MD    Allergies  Allergen Reactions  . Morphine Hives, Itching and Rash    His family history includes Cancer in his maternal grandfather; Diabetes in his mother; and Heart Problems in his father.  He  reports that he quit smoking about 3 months ago. His smoking use included Cigarettes. He has a 10 pack-year smoking history. He has never used smokeless tobacco. He reports that he does not drink alcohol or use  illicit drugs.  Review of Systems  Constitutional: Negative for fever, chills, diaphoresis, activity change, appetite change, fatigue and unexpected weight change.  HENT: Negative for hearing loss, ear pain, nosebleeds, congestion, sore throat, facial swelling, rhinorrhea, sneezing, mouth sores, trouble swallowing, neck pain, neck stiffness, dental problem, voice change, postnasal drip, sinus pressure, tinnitus and ear discharge.   Eyes: Negative for photophobia, discharge, itching and visual disturbance.  Respiratory: Positive for cough, chest tightness, shortness of breath and wheezing. Negative for apnea, choking and stridor.   Cardiovascular: Negative for chest pain, palpitations and leg swelling.  Gastrointestinal: Negative for nausea, vomiting, abdominal pain, constipation, blood in stool and abdominal distention.  Genitourinary: Negative for dysuria, urgency, frequency, hematuria, flank pain, decreased urine volume and difficulty urinating.  Musculoskeletal: Negative for myalgias, back pain, joint swelling, arthralgias and gait problem.  Skin: Negative for color change, pallor and rash.  Neurological: Negative for dizziness, tremors, seizures, syncope, speech difficulty, weakness, light-headedness, numbness and headaches.  Hematological: Negative for adenopathy. Does not bruise/bleed easily.  Psychiatric/Behavioral: Positive for sleep disturbance. Negative for confusion and agitation. The patient is not nervous/anxious.    Physical Exam:  General - No distress ENT - No sinus tenderness, no oral exudate, no LAN Cardiac - s1s2 regular, no murmur Chest - decreased breath sounds, no wheeze/rales Back - No focal tenderness Abd - Soft, non-tender Ext - No edema Neuro - Normal strength Skin - No rashes Psych - Normal mood, and behavior  Assessment:  Coralyn Helling, MD [facility] Pulmonary/Critical Care 01/24/2013, 10:24 AM Pager:  [phone] After 3pm call: [phone]

## 2013-01-24 NOTE — Patient Instructions (Signed)
Will call with results of sleep study Will arrange for breathing test (PFT) and call with results Follow up in 3 months

## 2013-01-24 NOTE — Assessment & Plan Note (Signed)
Not an active issue at present.  Will monitor clinically.

## 2013-01-24 NOTE — Progress Notes (Deleted)
  Subjective:    Patient ID: Scott Holden, male    DOB: 23-May-1956, 57 y.o.   MRN: [id number]  HPI    Review of Systems  Constitutional: Negative for fever, chills, diaphoresis, activity change, appetite change, fatigue and unexpected weight change.  HENT: Negative for hearing loss, ear pain, nosebleeds, congestion, sore throat, facial swelling, rhinorrhea, sneezing, mouth sores, trouble swallowing, neck pain, neck stiffness, dental problem, voice change, postnasal drip, sinus pressure, tinnitus and ear discharge.   Eyes: Negative for photophobia, discharge, itching and visual disturbance.  Respiratory: Positive for cough, chest tightness, shortness of breath and wheezing. Negative for apnea, choking and stridor.   Cardiovascular: Negative for chest pain, palpitations and leg swelling.  Gastrointestinal: Negative for nausea, vomiting, abdominal pain, constipation, blood in stool and abdominal distention.  Genitourinary: Negative for dysuria, urgency, frequency, hematuria, flank pain, decreased urine volume and difficulty urinating.  Musculoskeletal: Negative for myalgias, back pain, joint swelling, arthralgias and gait problem.  Skin: Negative for color change, pallor and rash.  Neurological: Negative for dizziness, tremors, seizures, syncope, speech difficulty, weakness, light-headedness, numbness and headaches.  Hematological: Negative for adenopathy. Does not bruise/bleed easily.  Psychiatric/Behavioral: Positive for sleep disturbance. Negative for confusion and agitation. The patient is not nervous/anxious.        Objective:   Physical Exam        Assessment & Plan:

## 2013-01-24 NOTE — Assessment & Plan Note (Signed)
He did not need oxygen after hospital discharge.  He did have hypercapnia on ABG in hospital >> most likely related to sleep related hypoventilation.  Await results of sleep study.

## 2013-01-24 NOTE — Assessment & Plan Note (Signed)
He can use prn claritin.

## 2013-01-24 NOTE — Assessment & Plan Note (Signed)
Likely has OSA +/- OHS.  He had sleep study with [facility] on 01/23/13.  Will call him once results are available.

## 2013-01-24 NOTE — Assessment & Plan Note (Signed)
Breathing has improved since hospital stay.  He is to complete course of prednisone from hospital.  He can use duoneb prn >> discussed indications for when to use bronchodilators.  Will arrange PFT to further assess for COPD.

## 2013-01-28 ENCOUNTER — Emergency Department (HOSPITAL_COMMUNITY)
Admission: EM | Admit: 2013-01-28 | Discharge: 2013-01-28 | Disposition: A | Discharge status: Home / Self Care | Payer: Medicaid Other | Attending: Emergency Medicine | Admitting: Emergency Medicine

## 2013-01-28 ENCOUNTER — Encounter (HOSPITAL_COMMUNITY): Payer: Self-pay | Admitting: *Deleted

## 2013-01-28 DIAGNOSIS — R42 Dizziness and giddiness: Secondary | ICD-10-CM | POA: Insufficient documentation

## 2013-01-28 DIAGNOSIS — Z87891 Personal history of nicotine dependence: Secondary | ICD-10-CM | POA: Insufficient documentation

## 2013-01-28 DIAGNOSIS — R1013 Epigastric pain: Secondary | ICD-10-CM | POA: Insufficient documentation

## 2013-01-28 DIAGNOSIS — R110 Nausea: Secondary | ICD-10-CM | POA: Insufficient documentation

## 2013-01-28 DIAGNOSIS — Z8673 Personal history of transient ischemic attack (TIA), and cerebral infarction without residual deficits: Secondary | ICD-10-CM | POA: Insufficient documentation

## 2013-01-28 DIAGNOSIS — Z8679 Personal history of other diseases of the circulatory system: Secondary | ICD-10-CM | POA: Insufficient documentation

## 2013-01-28 DIAGNOSIS — R195 Other fecal abnormalities: Secondary | ICD-10-CM | POA: Insufficient documentation

## 2013-01-28 DIAGNOSIS — Z79899 Other long term (current) drug therapy: Secondary | ICD-10-CM | POA: Insufficient documentation

## 2013-01-28 DIAGNOSIS — Z96649 Presence of unspecified artificial hip joint: Secondary | ICD-10-CM | POA: Insufficient documentation

## 2013-01-28 DIAGNOSIS — Z87442 Personal history of urinary calculi: Secondary | ICD-10-CM | POA: Insufficient documentation

## 2013-01-28 DIAGNOSIS — I10 Essential (primary) hypertension: Secondary | ICD-10-CM | POA: Insufficient documentation

## 2013-01-28 DIAGNOSIS — Z8739 Personal history of other diseases of the musculoskeletal system and connective tissue: Secondary | ICD-10-CM | POA: Insufficient documentation

## 2013-01-28 DIAGNOSIS — K921 Melena: Secondary | ICD-10-CM | POA: Insufficient documentation

## 2013-01-28 LAB — CBC WITH DIFFERENTIAL/PLATELET
Basophils Absolute: 0.1 10*3/uL (ref 0.0–0.1)
Basophils Relative: 0 % (ref 0–1)
Eosinophils Absolute: 0.4 10*3/uL (ref 0.0–0.7)
Eosinophils Relative: 3 % (ref 0–5)
HCT: 43.9 % (ref 39.0–52.0)
MCH: 30.1 pg (ref 26.0–34.0)
MCHC: 34.4 g/dL (ref 30.0–36.0)
MCV: 87.5 fL (ref 78.0–100.0)
Monocytes Absolute: 0.7 10*3/uL (ref 0.1–1.0)
RDW: 14.4 % (ref 11.5–15.5)

## 2013-01-28 LAB — COMPREHENSIVE METABOLIC PANEL
AST: 14 U/L (ref 0–37)
Albumin: 3.4 g/dL — ABNORMAL LOW (ref 3.5–5.2)
BUN: 14 mg/dL (ref 6–23)
Calcium: 9.4 mg/dL (ref 8.4–10.5)
Creatinine, Ser: 0.98 mg/dL (ref 0.50–1.35)

## 2013-01-28 LAB — LACTIC ACID, PLASMA: Lactic Acid, Venous: 2 mmol/L (ref 0.5–2.2)

## 2013-01-28 LAB — URINALYSIS, ROUTINE W REFLEX MICROSCOPIC
Glucose, UA: NEGATIVE mg/dL
Hgb urine dipstick: NEGATIVE
Specific Gravity, Urine: 1.015 (ref 1.005–1.030)
Urobilinogen, UA: 0.2 mg/dL (ref 0.0–1.0)

## 2013-01-28 LAB — LIPASE, BLOOD: Lipase: 38 U/L (ref 11–59)

## 2013-01-28 LAB — URINE MICROSCOPIC-ADD ON

## 2013-01-28 MED ORDER — SODIUM CHLORIDE 0.9 % IV BOLUS (SEPSIS)
1000.0000 mL | Freq: Once | INTRAVENOUS | Status: AC
  Administered 2013-01-28: 1000 mL via INTRAVENOUS

## 2013-01-28 MED ORDER — FENTANYL CITRATE 0.05 MG/ML IJ SOLN
50.0000 ug | Freq: Once | INTRAMUSCULAR | Status: AC
  Administered 2013-01-28: 50 ug via INTRAVENOUS
  Filled 2013-01-28: qty 2

## 2013-01-28 NOTE — ED Notes (Signed)
Pt states started having rectal bleeding last Thursday, has worsened over time, pt states blood is dark red, pt states also having abdominal pain, had a colonoscopy 3 months ago, pt states now feeling dizzy when standing.

## 2013-01-28 NOTE — ED Provider Notes (Signed)
CSN: [id number]     Arrival date & time 01/28/13  1100 History     First MD Initiated Contact with Patient 01/28/13 1128     Chief Complaint  Patient presents with  . Rectal Bleeding  . Dizziness   (Consider location/radiation/quality/duration/timing/severity/associated sxs/prior Treatment) Patient is a 57 y.o. male presenting with hematochezia.  Rectal Bleeding Quality: "dark" Amount:  Copious Duration:  4 days Timing:  Constant Progression:  Worsening Chronicity:  New Context: not anal fissures, not diarrhea and not hemorrhoids   Relieved by:  Nothing Worsened by:  Nothing tried Ineffective treatments:  None tried Associated symptoms: abdominal pain and light-headedness   Associated symptoms: no fever, no loss of consciousness and no vomiting   Associated symptoms comment:  Nausea Abdominal pain:    Location:  Epigastric   Quality:  Sharp   Severity:  Moderate   Timing:  Constant   Past Medical History  Diagnosis Date  . Kidney calculi   . Hypertension   . History of TIAs   . Arthritis   . Back pain     trouble with lumbar 2, 3, and 4 - degenerative disease per pt  . Unspecified transient cerebral ischemia   . HA (headache)   . Syncope and collapse    Past Surgical History  Procedure Laterality Date  . Total hip arthroplasty    . Joint replacement    . Knee surgery    . Hernia repair  2003   Family History  Problem Relation Age of Onset  . Cancer Maternal Grandfather     prostate  . Diabetes Mother   . Heart Problems Father    History  Substance Use Topics  . Smoking status: Former Smoker -- 0.50 packs/day for 20 years    Types: Cigarettes    Quit date: 10/24/2012  . Smokeless tobacco: Never Used     Comment: three cigarettes daily  . Alcohol Use: No    Review of Systems  Constitutional: Negative for fever.  HENT: Negative for congestion.   Respiratory: Negative for cough and shortness of breath.   Cardiovascular: Negative for chest pain.   Gastrointestinal: Positive for abdominal pain and hematochezia. Negative for nausea, vomiting and diarrhea.  Neurological: Positive for light-headedness. Negative for loss of consciousness.  All other systems reviewed and are negative.    Allergies  Morphine  Home Medications   Current Outpatient Rx  Name  Route  Sig  Dispense  Refill  . acetaminophen (TYLENOL) 325 MG tablet   Oral   Take 650 mg by mouth every 6 (six) hours as needed (For back pain.).         Marland Kitchen albuterol (PROVENTIL HFA;VENTOLIN HFA) 108 (90 BASE) MCG/ACT inhaler   Inhalation   Inhale 1-2 puffs into the lungs every 6 (six) hours as needed for wheezing.   1 Inhaler   0   . diltiazem (DILACOR XR) 240 MG 24 hr capsule   Oral   Take 240 mg by mouth every morning.          . dipyridamole-aspirin (AGGRENOX) 200-25 MG per 12 hr capsule   Oral   Take 1 capsule by mouth 2 (two) times daily.         . hydrochlorothiazide (HYDRODIURIL) 25 MG tablet   Oral   Take 25 mg by mouth every morning.         Marland Kitchen ipratropium-albuterol (DUONEB) 0.5-2.5 (3) MG/3ML SOLN   Nebulization   Take 3 mLs by nebulization every 4 (  four) hours as needed (For shortness of breath).   360 mL   3   . lamoTRIgine (LAMICTAL) 25 MG tablet   Oral   Take 2 tablets (50 mg total) by mouth 2 (two) times daily.   120 tablet   6   . loratadine (CLARITIN) 10 MG tablet   Oral   Take 10 mg by mouth every morning.         Marland Kitchen losartan (COZAAR) 50 MG tablet   Oral   Take 50 mg by mouth every morning.         Marland Kitchen omeprazole (PRILOSEC) 40 MG capsule   Oral   Take 40 mg by mouth every morning.          . traMADol (ULTRAM) 50 MG tablet   Oral   Take 50 mg by mouth every 6 (six) hours as needed for pain.          BP 112/69  Pulse 95  Temp(Src) 98.9 F (37.2 C) (Oral)  Resp 18  Ht 5\' 11"  (1.803 m)  Wt 297 lb (134.718 kg)  BMI 41.44 kg/m2  SpO2 95% Physical Exam  Nursing note and vitals reviewed. Constitutional: He is  oriented to person, place, and time. He appears well-developed and well-nourished. No distress.  HENT:  Head: Normocephalic and atraumatic.  Mouth/Throat: Oropharynx is clear and moist.  Eyes: Conjunctivae are normal. Pupils are equal, round, and reactive to light. No scleral icterus.  Neck: Neck supple.  Cardiovascular: Normal rate, regular rhythm, normal heart sounds and intact distal pulses.   No murmur heard. Pulmonary/Chest: Effort normal and breath sounds normal. No stridor. No respiratory distress. He has no wheezes. He has no rales.  Abdominal: Soft. He exhibits no distension. There is no tenderness.  Genitourinary: Rectal exam shows no external hemorrhoid, no fissure, no mass, no tenderness and anal tone normal. Guaiac negative stool (no stool in rectal vault. residue heme negative).  Musculoskeletal: Normal range of motion. He exhibits no edema.  Neurological: He is alert and oriented to person, place, and time. He has normal strength. Coordination and gait normal.  No truncal ataxia  Skin: Skin is warm and dry. No rash noted.  Psychiatric: He has a normal mood and affect. His behavior is normal.    ED Course   Procedures (including critical care time)  Labs Reviewed  CBC WITH DIFFERENTIAL - Abnormal; Notable for the following:    WBC 15.2 (*)    Neutro Abs 9.6 (*)    Lymphs Abs 4.5 (*)    All other components within normal limits  COMPREHENSIVE METABOLIC PANEL - Abnormal; Notable for the following:    Sodium 134 (*)    Glucose, Bld 123 (*)    Albumin 3.4 (*)    GFR calc non Af Amer 90 (*)    All other components within normal limits  URINALYSIS, ROUTINE W REFLEX MICROSCOPIC - Abnormal; Notable for the following:    Leukocytes, UA TRACE (*)    All other components within normal limits  URINE MICROSCOPIC-ADD ON - Abnormal; Notable for the following:    Squamous Epithelial / LPF FEW (*)    Bacteria, UA FEW (*)    Crystals CA OXALATE CRYSTALS (*)    All other  components within normal limits  LIPASE, BLOOD  LACTIC ACID, PLASMA   No results found.  EKG - sinus, rate 82, leftward axis, normal intervals, QRSD 112, nonspecific t wave changes, similar to prior.    1. Lightheaded  2. Dark stools     MDM  57 yo male complaining of dark blood per rectum for 4 days, getting worse.  Associated dizziness and near syncope upon standing from bowel movement.  Well appearing, vss.  No formed stool in rectal vault, but liquid is heme negative.  Plan labs.  No chest pain or SOB.  History does not seem c/w ACS.  No vertigo.  No signs or symptoms of CVA.    3:40 PM Labwork unremarkable, Hg stable.  UA pending.  Pt requesting dose of pain meds for his chronic back pain.  Otherwise feeling better.  Remains well appearing and HDS.  Plan dc home with PCP follow up, pending UA results.   Candyce Churn, MD 01/29/13 [phone]

## 2013-01-28 NOTE — ED Notes (Signed)
Pt. Unable to urinate at this time. Pt. Aware of giving a urine specimen. Nurse was notified.

## 2013-01-29 ENCOUNTER — Telehealth: Payer: Self-pay | Admitting: Neurology

## 2013-01-29 DIAGNOSIS — G4733 Obstructive sleep apnea (adult) (pediatric): Secondary | ICD-10-CM

## 2013-01-29 DIAGNOSIS — R9431 Abnormal electrocardiogram [ECG] [EKG]: Secondary | ICD-10-CM

## 2013-01-29 NOTE — Telephone Encounter (Signed)
Please call and notify patient that the recent sleep study confirmed the diagnosis of OSA. He did well with CPAP during the study with significant improvement of the respiratory events. Therefore, I would like start the patient on CPAP at home. I placed the order in the chart. Please also advise patient His sleep study showed that several different EKG changes including frequent extra beats or PVCs. I would like for him to see his cardiologist. He does not have a cardiologist I would like to refer him to one. Please let me know.  Arrange for CPAP set up at home through a DME company of patient's choice and fax/route report to PCP and referring MD (if other than PCP).   The patient will also need a follow up appointment with me in 6-8 weeks post set up that has to be scheduled; help the patient schedule this (in a follow-up slot).   Please re-enforce the importance of compliance with treatment and the need for [place] to monitor compliance data.   Once you have spoken to the patient and scheduled the return appointment, you may close this encounter, thanks,   Huston Foley, MD, PhD [facility] ([facility])

## 2013-01-30 ENCOUNTER — Telehealth: Payer: Self-pay | Admitting: Pulmonary Disease

## 2013-01-30 ENCOUNTER — Encounter: Payer: Self-pay | Admitting: *Deleted

## 2013-01-30 NOTE — Telephone Encounter (Signed)
Called, spoke with pt -  He had sleep study done 8.1 at [facility] and is requesting results.  Advised it can take up to a few weeks to get results back and would send msg to [facility] to advise on results when available.  He verbalized understanding.  Dr. Craige Cotta, pls advise.  Thank you.

## 2013-01-30 NOTE — Telephone Encounter (Signed)
Pt called and we discussed results.  He states he was in the hospital and they placed him on a CPAP while he was there and a CPAP order had been sent to [facility] already (looks like [facility]).  [facility] was working on the authorization from [employer].  I will contact [facility] today and send the order for DME that Dr. Frances Furbish has provided.  Pt and I discussed follow up and an appt was scheduled with Dr. Frances Furbish for Wednesday 03/19/13 at 11:00 AM for his post CPAP visit.  Pt was appreciative and anxious to get started with therapy.  He states he hasn't slept longer than 1.5 hours at a time for several years.

## 2013-01-30 NOTE — Telephone Encounter (Signed)
His sleep study was done with [facility] neurology by Dr. Huston Foley.  He should call Dr. Teofilo Pod office to get results.

## 2013-01-30 NOTE — Telephone Encounter (Signed)
Called and spoke with pt and he is aware of VS recs.  Pt stated that he did call and get the results.  He stated that it was bad and he will follow up with neurology for any other needs.

## 2013-02-16 ENCOUNTER — Encounter (HOSPITAL_COMMUNITY): Payer: Self-pay | Admitting: Emergency Medicine

## 2013-02-16 ENCOUNTER — Other Ambulatory Visit: Payer: Self-pay

## 2013-02-16 ENCOUNTER — Emergency Department (HOSPITAL_COMMUNITY)
Admission: EM | Admit: 2013-02-16 | Discharge: 2013-02-16 | Disposition: A | Discharge status: Home / Self Care | Payer: Medicaid Other | Attending: Emergency Medicine | Admitting: Emergency Medicine

## 2013-02-16 ENCOUNTER — Emergency Department (HOSPITAL_COMMUNITY): Payer: Medicaid Other

## 2013-02-16 DIAGNOSIS — Z79899 Other long term (current) drug therapy: Secondary | ICD-10-CM | POA: Insufficient documentation

## 2013-02-16 DIAGNOSIS — Z87891 Personal history of nicotine dependence: Secondary | ICD-10-CM | POA: Insufficient documentation

## 2013-02-16 DIAGNOSIS — R062 Wheezing: Secondary | ICD-10-CM | POA: Insufficient documentation

## 2013-02-16 DIAGNOSIS — Z87442 Personal history of urinary calculi: Secondary | ICD-10-CM | POA: Insufficient documentation

## 2013-02-16 DIAGNOSIS — R110 Nausea: Secondary | ICD-10-CM | POA: Insufficient documentation

## 2013-02-16 DIAGNOSIS — M129 Arthropathy, unspecified: Secondary | ICD-10-CM | POA: Insufficient documentation

## 2013-02-16 DIAGNOSIS — R42 Dizziness and giddiness: Secondary | ICD-10-CM | POA: Insufficient documentation

## 2013-02-16 DIAGNOSIS — I10 Essential (primary) hypertension: Secondary | ICD-10-CM | POA: Insufficient documentation

## 2013-02-16 DIAGNOSIS — R0682 Tachypnea, not elsewhere classified: Secondary | ICD-10-CM | POA: Insufficient documentation

## 2013-02-16 DIAGNOSIS — R0600 Dyspnea, unspecified: Secondary | ICD-10-CM

## 2013-02-16 DIAGNOSIS — R0609 Other forms of dyspnea: Secondary | ICD-10-CM | POA: Insufficient documentation

## 2013-02-16 DIAGNOSIS — R55 Syncope and collapse: Secondary | ICD-10-CM | POA: Insufficient documentation

## 2013-02-16 DIAGNOSIS — Z8669 Personal history of other diseases of the nervous system and sense organs: Secondary | ICD-10-CM | POA: Insufficient documentation

## 2013-02-16 DIAGNOSIS — Z8673 Personal history of transient ischemic attack (TIA), and cerebral infarction without residual deficits: Secondary | ICD-10-CM | POA: Insufficient documentation

## 2013-02-16 DIAGNOSIS — R0989 Other specified symptoms and signs involving the circulatory and respiratory systems: Secondary | ICD-10-CM | POA: Insufficient documentation

## 2013-02-16 LAB — COMPREHENSIVE METABOLIC PANEL
ALT: 12 U/L (ref 0–53)
CO2: 26 mEq/L (ref 19–32)
Calcium: 9.1 mg/dL (ref 8.4–10.5)
Creatinine, Ser: 0.91 mg/dL (ref 0.50–1.35)
GFR calc Af Amer: >90 mL/min (ref >90)
GFR calc non Af Amer: >90 mL/min (ref >90)
Glucose, Bld: 114 mg/dL — ABNORMAL HIGH (ref 70–99)
Sodium: 137 mEq/L (ref 135–145)
Total Protein: 6.6 g/dL (ref 6.0–8.3)

## 2013-02-16 LAB — CBC
Hemoglobin: 14.6 g/dL (ref 13.0–17.0)
MCH: 30 pg (ref 26.0–34.0)
MCHC: 34 g/dL (ref 30.0–36.0)
MCV: 88.1 fL (ref 78.0–100.0)
RBC: 4.87 MIL/uL (ref 4.22–5.81)

## 2013-02-16 LAB — PRO B NATRIURETIC PEPTIDE: Pro B Natriuretic peptide (BNP): 182.8 pg/mL — ABNORMAL HIGH (ref 0–125)

## 2013-02-16 LAB — POCT I-STAT TROPONIN I: Troponin i, poc: 0 ng/mL (ref 0.00–0.08)

## 2013-02-16 MED ORDER — ALBUTEROL SULFATE (5 MG/ML) 0.5% IN NEBU
5.0000 mg | INHALATION_SOLUTION | Freq: Once | RESPIRATORY_TRACT | Status: AC
  Administered 2013-02-16: 5 mg via RESPIRATORY_TRACT
  Filled 2013-02-16: qty 1

## 2013-02-16 MED ORDER — HYDROCODONE-ACETAMINOPHEN 5-325 MG PO TABS
1.0000 | ORAL_TABLET | Freq: Once | ORAL | Status: AC
  Administered 2013-02-16: 1 via ORAL
  Filled 2013-02-16: qty 1

## 2013-02-16 MED ORDER — PREDNISONE 20 MG PO TABS: 40.0000 mg | ORAL_TABLET | Freq: Every day | ORAL | Status: AC

## 2013-02-16 MED ORDER — HYDROCODONE-ACETAMINOPHEN 5-325 MG PO TABS: 1.0000 | ORAL_TABLET | Freq: Three times a day (TID) | ORAL | Status: DC | PRN

## 2013-02-16 MED ORDER — METHYLPREDNISOLONE SODIUM SUCC 125 MG IJ SOLR
125.0000 mg | Freq: Once | INTRAMUSCULAR | Status: AC
  Administered 2013-02-16: 125 mg via INTRAVENOUS
  Filled 2013-02-16: qty 2

## 2013-02-16 NOTE — ED Provider Notes (Addendum)
CSN: [id number]     Arrival date & time 02/16/13  4098 History     First MD Initiated Contact with Patient 02/16/13 [phone]     Chief Complaint  Patient presents with  . Shortness of Breath  . Near Syncope   (Consider location/radiation/quality/duration/timing/severity/associated sxs/prior Treatment) HPI Patient presents with shortness of breath.  This episode began 3 days ago without clear precipitant.  Since onset has been progressive, in spite of using home medication.  Patient denies concurrent chest pain, fever.  He does endorse cough.  No syncope, though the patient does have near syncope, and has a several episodes over the past 3 days.  Past Medical History  Diagnosis Date  . Kidney calculi   . Hypertension   . History of TIAs   . Arthritis   . Back pain     trouble with lumbar 2, 3, and 4 - degenerative disease per pt  . Unspecified transient cerebral ischemia   . HA (headache)   . Syncope and collapse    Past Surgical History  Procedure Laterality Date  . Total hip arthroplasty    . Joint replacement    . Knee surgery    . Hernia repair  2003   Family History  Problem Relation Age of Onset  . Cancer Maternal Grandfather     prostate  . Diabetes Mother   . Heart Problems Father    History  Substance Use Topics  . Smoking status: Former Smoker -- 0.50 packs/day for 20 years    Types: Cigarettes    Quit date: 10/24/2012  . Smokeless tobacco: Never Used     Comment: three cigarettes daily  . Alcohol Use: No    Review of Systems  Constitutional:       Per HPI, otherwise negative  HENT:       Per HPI, otherwise negative  Respiratory:       Per HPI, otherwise negative  Cardiovascular:       Per HPI, otherwise negative  Gastrointestinal: Positive for nausea. Negative for vomiting.  Endocrine:       Negative aside from HPI  Genitourinary:       Neg aside from HPI   Musculoskeletal:       Per HPI, otherwise negative  Skin: Negative.   Neurological:  Positive for light-headedness. Negative for syncope.    Allergies  Morphine  Home Medications   Current Outpatient Rx  Name  Route  Sig  Dispense  Refill  . acetaminophen (TYLENOL) 325 MG tablet   Oral   Take 650 mg by mouth every 6 (six) hours as needed (For back pain.).         Marland Kitchen albuterol (PROVENTIL HFA;VENTOLIN HFA) 108 (90 BASE) MCG/ACT inhaler   Inhalation   Inhale 1-2 puffs into the lungs every 6 (six) hours as needed for wheezing.   1 Inhaler   0   . diltiazem (DILACOR XR) 240 MG 24 hr capsule   Oral   Take 240 mg by mouth every morning.          . dipyridamole-aspirin (AGGRENOX) 200-25 MG per 12 hr capsule   Oral   Take 1 capsule by mouth 2 (two) times daily.         . hydrochlorothiazide (HYDRODIURIL) 25 MG tablet   Oral   Take 25 mg by mouth every morning.         Marland Kitchen ipratropium-albuterol (DUONEB) 0.5-2.5 (3) MG/3ML SOLN   Nebulization   Take 3 mLs  by nebulization every 4 (four) hours as needed (For shortness of breath).   360 mL   3   . lamoTRIgine (LAMICTAL) 25 MG tablet   Oral   Take 2 tablets (50 mg total) by mouth 2 (two) times daily.   120 tablet   6   . loratadine (CLARITIN) 10 MG tablet   Oral   Take 10 mg by mouth every morning.         Marland Kitchen losartan (COZAAR) 50 MG tablet   Oral   Take 50 mg by mouth every morning.         Marland Kitchen omeprazole (PRILOSEC) 40 MG capsule   Oral   Take 40 mg by mouth every morning.          . traMADol (ULTRAM) 50 MG tablet   Oral   Take 50 mg by mouth every 6 (six) hours as needed for pain.          BP 145/90  Pulse 93  Temp(Src) 98.8 F (37.1 C) (Axillary)  Resp 22  SpO2 95% Physical Exam  Nursing note and vitals reviewed. Constitutional: He is oriented to person, place, and time. He appears well-developed. No distress.  HENT:  Head: Normocephalic and atraumatic.  Eyes: Conjunctivae and EOM are normal.  Cardiovascular: Normal rate and regular rhythm.   Pulmonary/Chest: No stridor.  Tachypnea noted. He has decreased breath sounds. He has wheezes.  Abdominal: He exhibits no distension.  Musculoskeletal: He exhibits no edema.  Neurological: He is alert and oriented to person, place, and time.  Skin: Skin is warm and dry.  Psychiatric: He has a normal mood and affect.    ED Course   Procedures (including critical care time)  Labs Reviewed  CBC  COMPREHENSIVE METABOLIC PANEL  PRO B NATRIURETIC PEPTIDE  URINALYSIS, ROUTINE W REFLEX MICROSCOPIC   No results found. No diagnosis found.  pulse oximetry 98%, supplemental oxygen abnormal Cardiac, regular sinus 90 normal MDM  Patient presents with dyspnea.  He improved substantially with bronchodilators, steroids.  Absent any evidence of ongoing infection, and with his improvement, and his coronary history, the patient was appropriate for discharge with outpatient followup with his pulmonologist.  Given the absence of chest pain, his continued use of Aggrenox, there is low suspicion of occult ACS.   Gerhard Munch, MD 02/16/13 0654   Date: 02/16/2013  Rate: 91  Rhythm: normal sinus rhythm  QRS Axis: left  Intervals: normal  ST/T Wave abnormalities: nonspecific T wave changes  Conduction Disutrbances:nonspecific intraventricular conduction delay  Narrative Interpretation:   Old EKG Reviewed: none available borderline   Gerhard Munch, MD 02/16/13 [phone]

## 2013-02-16 NOTE — ED Notes (Signed)
Bed: WA22 Expected date:  Expected time:  Means of arrival:  Comments: EMS 

## 2013-02-16 NOTE — ED Notes (Addendum)
Per EMS, pt. From home with complaint of SOB and near syncope episode at 0230 this morning as he got up going to the bathroom. Pt. Denies any pain, alert/ oriented x 3. Claimed of having history of ARF and COPD.  breathing treatment given on board EMS.

## 2013-02-21 ENCOUNTER — Ambulatory Visit (INDEPENDENT_AMBULATORY_CARE_PROVIDER_SITE_OTHER): Payer: Medicaid Other | Admitting: Pulmonary Disease

## 2013-02-21 DIAGNOSIS — J9610 Chronic respiratory failure, unspecified whether with hypoxia or hypercapnia: Secondary | ICD-10-CM

## 2013-02-21 LAB — PULMONARY FUNCTION TEST

## 2013-02-21 NOTE — Progress Notes (Signed)
PFT done today. 

## 2013-02-26 ENCOUNTER — Encounter: Payer: Self-pay | Admitting: Neurology

## 2013-02-27 ENCOUNTER — Telehealth: Payer: Self-pay | Admitting: Pulmonary Disease

## 2013-02-27 NOTE — Telephone Encounter (Signed)
PFT 02/21/13 >> FEV1 2.87 (83%) FEV1% 74, TLC 6.13 (93%), DLCO 96%, +BD.  Will have my nurse inform pt that PFT shows changes suggestive of asthma.  He is continue as needed albuterol and/or duoneb.  No other changes to regimen at this time unless he is having more trouble with his breathing.

## 2013-02-27 NOTE — Telephone Encounter (Signed)
Pt is aware of PFT results. 

## 2013-02-28 NOTE — Progress Notes (Signed)
Quick Note:  I reviewed the patient's CPAP compliance data from 02/11/2013 to 02/19/2013, which is a total of 9 days, during which time the patient used CPAP every day except for 0 days. The average usage for all days was 3 hours and 38 minutes. The percent used days greater than 4 hours was 67 %, indicating good to fair compliance. The residual AHI was 3.2 per hour, indicating an adequate treatment pressure of 10 cwp with EPR of 2. I will review this data with the patient at the next visit, provide feedback and additional trouble shooting if need be.  Huston Foley, MD, PhD [facility] ([facility]   ______

## 2013-03-07 ENCOUNTER — Encounter: Payer: Self-pay | Admitting: Neurology

## 2013-03-17 ENCOUNTER — Encounter: Payer: Self-pay | Admitting: Pulmonary Disease

## 2013-03-18 ENCOUNTER — Encounter: Payer: Self-pay | Admitting: Neurology

## 2013-03-19 ENCOUNTER — Institutional Professional Consult (permissible substitution): Payer: Medicaid Other | Admitting: Neurology

## 2013-03-20 NOTE — Progress Notes (Signed)
Quick Note:  I reviewed the patient's CPAP compliance data from 02/11/2013 to 03/12/2013, which is a total of 30 days, during which time the patient used CPAP every day except for 3 days. The average usage for all days was 3 hours and 45 minutes. The percent used days greater than 4 hours was 57 %, indicating mediocre compliance. The residual AHI was 6.4 per hour, indicating a somewhat suboptimal treatment pressure of 10 cwp with EPR of 2. I will review this data with the patient at the next visit, provide feedback and additional trouble shooting if need be.  Huston Foley, MD, PhD [facility] ([facility]   ______

## 2013-03-21 ENCOUNTER — Encounter: Payer: Self-pay | Admitting: Neurology

## 2013-03-25 ENCOUNTER — Encounter: Payer: Self-pay | Admitting: Cardiovascular Disease

## 2013-03-25 ENCOUNTER — Ambulatory Visit (INDEPENDENT_AMBULATORY_CARE_PROVIDER_SITE_OTHER): Payer: Medicaid Other | Admitting: Cardiovascular Disease

## 2013-03-25 VITALS — BP 130/90 | HR 106 | Ht 70.0 in | Wt 301.0 lb

## 2013-03-25 DIAGNOSIS — E785 Hyperlipidemia, unspecified: Secondary | ICD-10-CM

## 2013-03-25 DIAGNOSIS — J9610 Chronic respiratory failure, unspecified whether with hypoxia or hypercapnia: Secondary | ICD-10-CM

## 2013-03-25 DIAGNOSIS — I4949 Other premature depolarization: Secondary | ICD-10-CM

## 2013-03-25 DIAGNOSIS — I10 Essential (primary) hypertension: Secondary | ICD-10-CM

## 2013-03-25 DIAGNOSIS — I493 Ventricular premature depolarization: Secondary | ICD-10-CM | POA: Insufficient documentation

## 2013-03-25 DIAGNOSIS — R0600 Dyspnea, unspecified: Secondary | ICD-10-CM

## 2013-03-25 DIAGNOSIS — R0609 Other forms of dyspnea: Secondary | ICD-10-CM

## 2013-03-25 NOTE — Assessment & Plan Note (Signed)
F/U Dr Craige Cotta  Lungs still sound bad.  Continue CPAP , steroids and oxygen

## 2013-03-25 NOTE — Progress Notes (Signed)
Patient ID: Scott Holden, male   DOB: 06-22-1956, 57 y.o.   MRN: [id number] 57 yo referred for PVC, dyspnea and abnormal ECG  Patient in hospital a lot for chronic respitory failure related to previous smoking, sarcoid and sleep apnea.  Has had a rough time since quit smoking 5 months ago. Still on steroids. He indicates ? PVC;s in hospital in March  I see no documentation in epic.  He does not have palpitations.  No chest pain Chronic exertional dyspnea with some non productive cough.  Activity also limited by chronic back pain and sciatica worse on right side.  Had myovue in 2003 described as ? Old anterior mi EF 53% no ischemia  No further w/u done Reviewed ECG from   02/19/13  SR rate 91  LAD nonspecific T wave changes poor R wave progression  12/17/12  SR rate 90 LAD same with ? IVCD 11/27/12  SR rate 87 same with PAC;s   Echo done in hospital normal EF and no valve disease Reviewed  01/14/13  EF 55-60% mild AR  ROS: Denies fever, malais, weight loss, blurry vision, decreased visual acuity, cough, sputum, SOB, hemoptysis, pleuritic pain, palpitaitons, heartburn, abdominal pain, melena, lower extremity edema, claudication, or rash.  All other systems reviewed and negative   General: Affect appropriate Obese white male  HEENT: normal Neck supple with no adenopathy JVP normal no bruits no thyromegaly Lungs rhonchi and  wheezing and good diaphragmatic motion Heart:  S1/S2 no murmur,rub, gallop or click PMI normal Abdomen: benighn, BS positve, no tenderness, no AAA no bruit.  No HSM or HJR Distal pulses intact with no bruits Trace edema Neuro non-focal Skin warm and dry No muscular weakness  Medications Current Outpatient Prescriptions  Medication Sig Dispense Refill  . albuterol (PROVENTIL HFA;VENTOLIN HFA) 108 (90 BASE) MCG/ACT inhaler Inhale 1-2 puffs into the lungs every 6 (six) hours as needed for wheezing.  1 Inhaler  0  . diltiazem (DILACOR XR) 240 MG 24 hr capsule Take 240 mg by  mouth every morning.       . dipyridamole-aspirin (AGGRENOX) 200-25 MG per 12 hr capsule Take 1 capsule by mouth 2 (two) times daily.      . hydrochlorothiazide (HYDRODIURIL) 25 MG tablet Take 25 mg by mouth every morning.      Marland Kitchen ipratropium-albuterol (DUONEB) 0.5-2.5 (3) MG/3ML SOLN Take 3 mLs by nebulization every 4 (four) hours as needed (For shortness of breath).  360 mL  3  . lamoTRIgine (LAMICTAL) 25 MG tablet Take 2 tablets (50 mg total) by mouth 2 (two) times daily.  120 tablet  6  . loratadine (CLARITIN) 10 MG tablet Take 10 mg by mouth every morning.      Marland Kitchen losartan (COZAAR) 50 MG tablet Take 50 mg by mouth every morning.      Marland Kitchen omeprazole (PRILOSEC) 40 MG capsule Take 40 mg by mouth every morning.        No current facility-administered medications for this visit.    Allergies Morphine  Family History: Family History  Problem Relation Age of Onset  . Cancer Maternal Grandfather     prostate  . Diabetes Mother   . Heart Problems Father     Social History: History   Social History  . Marital Status: Single    Spouse Name: N/A    Number of Children: 4  . Years of Education: 12   Occupational History  . disabled    Social History Main Topics  .  Smoking status: Former Smoker -- 0.50 packs/day for 20 years    Types: Cigarettes    Quit date: 10/24/2012  . Smokeless tobacco: Never Used     Comment: three cigarettes daily  . Alcohol Use: No  . Drug Use: No  . Sexual Activity: No   Other Topics Concern  . Not on file   Social History Narrative   Patient is disabled and not working.Patient is divorced and has four children. Patient drinks about three cups of coffee daily. Right handed. High school education.    Electrocardiogram:  See HPI  Assessment and Plan

## 2013-03-25 NOTE — Patient Instructions (Addendum)
Your physician has requested that you have a lexiscan myoview.   Please follow instruction sheet, as given.  Your physician recommends that you schedule a follow-up appointment in: AS NEEDED BASIS

## 2013-03-25 NOTE — Assessment & Plan Note (Signed)
Apparently seen on telemetry in hospital.  ECG;s with PAC.  Given significant lung issues dyspnea and abnormal ECG with long time smoking Should have CAD r/o  F/U lexiscan myovue 2 day protocol

## 2013-03-25 NOTE — Assessment & Plan Note (Signed)
Cholesterol is at goal.  Continue current dose of statin and diet Rx.  No myalgias or side effects.  F/U  LFT's in 6 months. Lab Results  Component Value Date   LDLCALC 98 01/13/2013

## 2013-03-25 NOTE — Assessment & Plan Note (Signed)
Well controlled.  Continue current medications and low sodium Dash type diet.    

## 2013-03-28 ENCOUNTER — Telehealth: Payer: Self-pay | Admitting: Pulmonary Disease

## 2013-03-28 NOTE — Telephone Encounter (Signed)
Error.Scott Holden ° °

## 2013-04-03 ENCOUNTER — Ambulatory Visit (INDEPENDENT_AMBULATORY_CARE_PROVIDER_SITE_OTHER): Payer: Medicaid Other | Admitting: Nurse Practitioner

## 2013-04-03 ENCOUNTER — Encounter: Payer: Self-pay | Admitting: Nurse Practitioner

## 2013-04-03 VITALS — BP 141/88 | HR 90 | Ht 70.0 in | Wt 309.0 lb

## 2013-04-03 DIAGNOSIS — R51 Headache: Secondary | ICD-10-CM

## 2013-04-03 DIAGNOSIS — R55 Syncope and collapse: Secondary | ICD-10-CM

## 2013-04-03 DIAGNOSIS — G459 Transient cerebral ischemic attack, unspecified: Secondary | ICD-10-CM

## 2013-04-03 DIAGNOSIS — G4733 Obstructive sleep apnea (adult) (pediatric): Secondary | ICD-10-CM | POA: Insufficient documentation

## 2013-04-03 MED ORDER — LAMOTRIGINE 25 MG PO TABS: ORAL_TABLET | ORAL | Status: DC

## 2013-04-03 NOTE — Progress Notes (Signed)
[facility]  PATIENT: Scott Holden DOB: Jun 12, 1956   REASON FOR VISIT: Followup for passing out episodes, history of TIAs   HISTORY OF PRESENT ILLNESS: Mr. Kuenzel, 57 year old white male returns for followup. He was last seen 11/20/2012. 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 He has had no loss of vision or blackouts with these episodes until Novemeber 2013 when seen in the ER. He has approximately 1 episode monthly which he states is brief. He has had no falls in the last year, he has a chronic back condition that is treated by Dr. Ethelene Hal and pain in good control. He had knee surgery since last seen and trying to get off Percocet. He also has a history of headaches but has had no recent bad headaches, well-controlled on verapamil. He has history of sarcoidosis. He also has a history of hyperlipidemia which is followed by a cardiologist at [facility]. Gets no regular exercise. Seen in the emergency room on 04/27/2012 for an episode of passing out in the bathroom at home. He has denied blackout episodes in the past, he has no idea how long he was out, he did not have dizziness or prodrome prior to passing out. His TIA episodes in the past have been right-sided numbness. He did not have EEG done. MRI of the brain did not show anything acute. Carotid duplex without significant stenosis. He did not have recurrent events while in the hospital. He continues to complain of episodes where he just doesn't feel right, and is unable to communicate with his son when this occurs. He may stare off into space for a few moments.He is now having several events per months, he has not kept a record. EEG after last visit was normal however he was placed on Lamictal and these events have decreased in number. He has also been diagnosed with obstructive sleep apnea and during that evaluation was found to have EKG changes of PVCs. He is currently seeing Dr. Eden Emms .    REVIEW  OF SYSTEMS: Full 14 system review of systems performed and notable only for:  Constitutional: N/A  Cardiovascular: N/A  Ear/Nose/Throat: N/A  Skin: N/A  Eyes: N/A  Respiratory: Shortness of breath, cough, wheezing  Gastroitestinal: N/A  Hematology/Lymphatic: N/A  Endocrine: N/A Musculoskeletal: Joint pain  Allergy/Immunology: N/A  Neurological: Numbness Psychiatric: Not enough sleep   ALLERGIES: Allergies  Allergen Reactions  . Morphine Hives, Itching and Rash    HOME MEDICATIONS: Outpatient Prescriptions Prior to Visit  Medication Sig Dispense Refill  . albuterol (PROVENTIL HFA;VENTOLIN HFA) 108 (90 BASE) MCG/ACT inhaler Inhale 1-2 puffs into the lungs every 6 (six) hours as needed for wheezing.  1 Inhaler  0  . diltiazem (DILACOR XR) 240 MG 24 hr capsule Take 240 mg by mouth every morning.       . dipyridamole-aspirin (AGGRENOX) 200-25 MG per 12 hr capsule Take 1 capsule by mouth 2 (two) times daily.      . hydrochlorothiazide (HYDRODIURIL) 25 MG tablet Take 25 mg by mouth every morning.      Marland Kitchen ipratropium-albuterol (DUONEB) 0.5-2.5 (3) MG/3ML SOLN Take 3 mLs by nebulization every 4 (four) hours as needed (For shortness of breath).  360 mL  3  . loratadine (CLARITIN) 10 MG tablet Take 10 mg by mouth every morning.      Marland Kitchen losartan (COZAAR) 50 MG tablet Take 50 mg by mouth every morning.      Marland Kitchen omeprazole (PRILOSEC) 40 MG capsule  Take 40 mg by mouth every morning.       . lamoTRIgine (LAMICTAL) 25 MG tablet Take 2 tablets (50 mg total) by mouth 2 (two) times daily.  120 tablet  6   No facility-administered medications prior to visit.    PAST MEDICAL HISTORY: Past Medical History  Diagnosis Date  . Kidney calculi   . Hypertension   . History of TIAs   . Arthritis   . Back pain     trouble with lumbar 2, 3, and 4 - degenerative disease per pt  . Unspecified transient cerebral ischemia   . HA (headache)   . Syncope and collapse     PAST SURGICAL HISTORY: Past  Surgical History  Procedure Laterality Date  . Total hip arthroplasty    . Joint replacement    . Knee surgery    . Hernia repair  2003    FAMILY HISTORY: Family History  Problem Relation Age of Onset  . Cancer Maternal Grandfather     prostate  . Diabetes Mother   . Heart Problems Father     SOCIAL HISTORY: History   Social History  . Marital Status: Single    Spouse Name: N/A    Number of Children: 4  . Years of Education: 12   Occupational History  . disabled    Social History Main Topics  . Smoking status: Former Smoker -- 0.50 packs/day for 20 years    Types: Cigarettes    Quit date: 10/24/2012  . Smokeless tobacco: Never Used     Comment: three cigarettes daily  . Alcohol Use: No  . Drug Use: No  . Sexual Activity: No   Other Topics Concern  . Not on file   Social History Narrative   Patient is disabled and not working.Patient is divorced and has four children. Patient drinks about three cups of coffee daily. Right handed. High school education.     PHYSICAL EXAM  Filed Vitals:   04/03/13 1541  BP: 141/88  Pulse: 90  Height: 5\' 10"  (1.778 m)  Weight: 309 lb (140.161 kg)   Body mass index is 44.34 kg/(m^2).  Generalized: Well developed, obese male in no acute distress  Head: normocephalic and atraumatic,. Oropharynx benign  Neck: Supple, no carotid bruits  Cardiac: Regular rate rhythm, Neurological examination   Mentation: Alert oriented to time, place, history taking. Follows all commands speech and language fluent  Cranial nerve II-XII: Fundoscopic exam reveals sharp disc margins.Pupils were equal round reactive to light extraocular movements were full, visual field were full on confrontational test. Facial sensation and strength were normal. hearing was intact to finger rubbing bilaterally. Uvula tongue midline. head turning and shoulder shrug and were normal and symmetric.Tongue protrusion into cheek strength was normal. Motor: normal bulk  and tone, full strength in the BUE, BLE, fine finger movements normal, no pronator drift. No focal weakness Sensory: normal and symmetric to light touch, pinprick, and  vibration  Coordination: finger-nose-finger, heel-to-shin bilaterally, no dysmetria Reflexes: Brachioradialis 2/2, biceps 2/2, triceps 2/2, patellar 2/2, Achilles 2/2, plantar responses were flexor bilaterally. Gait and Station: Rising up from seated position without assistance, normal stance,  moderate stride, good arm swing, smooth turning, able to perform tiptoe, and heel walking without difficulty. Tandem gait steady  DIAGNOSTIC DATA (LABS, IMAGING, TESTING) - I reviewed patient records, labs, notes, testing and imaging myself where available.  Lab Results  Component Value Date   WBC 10.9* 02/16/2013   HGB 14.6 02/16/2013   HCT  42.9 02/16/2013   MCV 88.1 02/16/2013   PLT 291 02/16/2013      Component Value Date/Time   NA 137 02/16/2013 0420   K 3.5 02/16/2013 0420   CL 101 02/16/2013 0420   CO2 26 02/16/2013 0420   GLUCOSE 114* 02/16/2013 0420   BUN 11 02/16/2013 0420   CREATININE 0.91 02/16/2013 0420   CALCIUM 9.1 02/16/2013 0420   PROT 6.6 02/16/2013 0420   ALBUMIN 3.3* 02/16/2013 0420   AST 15 02/16/2013 0420   ALT 12 02/16/2013 0420   ALKPHOS 71 02/16/2013 0420   BILITOT 0.2* 02/16/2013 0420   GFRNONAA >90 02/16/2013 0420   GFRAA >90 02/16/2013 0420   Lab Results  Component Value Date   CHOL 161 01/13/2013   HDL 36* 01/13/2013   LDLCALC 98 01/13/2013   TRIG 133 01/13/2013   CHOLHDL 4.5 01/13/2013   Lab Results  Component Value Date   HGBA1C 6.3* 01/14/2013    Lab Results  Component Value Date   TSH 1.435 01/13/2013      ASSESSMENT AND PLAN  57 y.o. 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 History of migraines currently in good control. Obstructive sleep apnea currently on CPAP consistently using about 4 hours. Episodes of staring  into space unable to communicate EEG was normal however events have decreased since being placed on Lamictal .   Increase Lamictal to 75 mg twice daily for one week then 100 mg twice daily Continue Aggrenox for secondary stroke prevention Continue verapamil for headaches Continue to lose weight any healthy Continue to use CPAP for obstructive sleep apnea  Nilda Riggs, [facility], [facility], APRN  [facility] [address] [place], [place] [address] [phone]

## 2013-04-03 NOTE — Patient Instructions (Addendum)
Pt to increase Lamictal to 75 mg twice daily for one week then increase to 100 mg BID (Patient has 25 mg tablet) Continue CPAP as directed Continue Aggrenox Continue healthy diet and weight loss for overall general health Followup in 6 months

## 2013-04-04 ENCOUNTER — Encounter: Payer: Self-pay | Admitting: Neurology

## 2013-04-04 ENCOUNTER — Ambulatory Visit (INDEPENDENT_AMBULATORY_CARE_PROVIDER_SITE_OTHER): Payer: Medicaid Other | Admitting: Neurology

## 2013-04-04 ENCOUNTER — Telehealth: Payer: Self-pay | Admitting: Pulmonary Disease

## 2013-04-04 VITALS — BP 148/91 | HR 78 | Temp 98.0°F | Ht 70.0 in | Wt 306.0 lb

## 2013-04-04 DIAGNOSIS — R0981 Nasal congestion: Secondary | ICD-10-CM

## 2013-04-04 DIAGNOSIS — G4733 Obstructive sleep apnea (adult) (pediatric): Secondary | ICD-10-CM

## 2013-04-04 DIAGNOSIS — R9431 Abnormal electrocardiogram [ECG] [EKG]: Secondary | ICD-10-CM

## 2013-04-04 DIAGNOSIS — J3489 Other specified disorders of nose and nasal sinuses: Secondary | ICD-10-CM

## 2013-04-04 MED ORDER — FLUTICASONE PROPIONATE 50 MCG/ACT NA SUSP: 2.0000 | Freq: Every day | NASAL | Status: DC

## 2013-04-04 NOTE — Telephone Encounter (Signed)
Called, spoke with pt.  He was seen by Dr. Craige Cotta on 01/24/13 for a Sleep Consult.  He had a sleep study done in Aug with [facility] and is currently on cpap.  Pt states he was seen by Dr. Frances Furbish today who recommend he call Dr. Craige Cotta to see if he could get o2 qhs.  Following are the Patient Instructions from his OV today with Dr. Frances Furbish:  Patient Instructions    Please continue using your CPAP regularly. While your insurance requires that you use CPAP at least 4 hours each night on 70% of the nights, I recommend, that you not skip any nights and use it throughout the night if you can. Getting used to CPAP does take time and patience and discipline. Untreated obstructive sleep apnea when it is moderate to severe can have an adverse impact on cardiovascular health and raise her risk for heart disease, arrhythmias, hypertension, congestive heart failure, stroke and diabetes. Untreated obstructive sleep apnea causes sleep disruption, nonrestorative sleep, and sleep deprivation. This can have an impact on your day to day functioning and cause daytime sleepiness and impairment of cognitive function, memory loss, mood disturbance, and problems focussing. Using CPAP regularly can improve these symptoms.  Use flonase nasal spray each night for nasal congestion.  Please remember to try to maintain good sleep hygiene, which means: Keep a regular sleep and wake schedule, try not to exercise or have a meal within 2 hours of your bedtime, try to keep your bedroom conducive for sleep, that is, cool and dark, without light distractors such as an illuminated alarm clock, and refrain from watching TV right before sleep or in the middle of the night and do not keep the TV or radio on during the night. Also, try not to use or play on electronic devices at bedtime, such as your cell phone, tablet PC or laptop. If you like to read at bedtime on an electronic device, try to dim the background light as much as possible.  Ask Dr.  Craige Cotta if you can get oxygen at night.    --------  Dr. Craige Cotta, please advise.  Thank you.

## 2013-04-04 NOTE — Telephone Encounter (Signed)
Order placed.  Pt aware and is aware he will receive another call to get this test set up.  He verbalized understanding and voiced no further questions or concerns at this time.

## 2013-04-04 NOTE — Patient Instructions (Addendum)
Please continue using your CPAP regularly. While your insurance requires that you use CPAP at least 4 hours each night on 70% of the nights, I recommend, that you not skip any nights and use it throughout the night if you can. Getting used to CPAP does take time and patience and discipline. Untreated obstructive sleep apnea when it is moderate to severe can have an adverse impact on cardiovascular health and raise her risk for heart disease, arrhythmias, hypertension, congestive heart failure, stroke and diabetes. Untreated obstructive sleep apnea causes sleep disruption, nonrestorative sleep, and sleep deprivation. This can have an impact on your day to day functioning and cause daytime sleepiness and impairment of cognitive function, memory loss, mood disturbance, and problems focussing. Using CPAP regularly can improve these symptoms.  Use flonase nasal spray each night for nasal congestion.   Please remember to try to maintain good sleep hygiene, which means: Keep a regular sleep and wake schedule, try not to exercise or have a meal within 2 hours of your bedtime, try to keep your bedroom conducive for sleep, that is, cool and dark, without light distractors such as an illuminated alarm clock, and refrain from watching TV right before sleep or in the middle of the night and do not keep the TV or radio on during the night. Also, try not to use or play on electronic devices at bedtime, such as your cell phone, tablet PC or laptop. If you like to read at bedtime on an electronic device, try to dim the background light as much as possible.  Ask Dr. Craige Cotta if you can get oxygen at night.

## 2013-04-04 NOTE — Progress Notes (Signed)
Subjective:    Patient ID: Scott Holden is a 57 y.o. male.  HPI  Interim history:   Scott Holden is a very pleasant 57 year old gentleman with a complex medical history including TIA, lung disease, chronic back pain, arthritis, syncope, headaches and recurrent spells of decreased attentiveness, suspicious for seizures, who presents for followup consultation of his severe obstructive sleep apnea after his recent split-night sleep study. He is unaccompanied today. I first met him on 01/23/2013 at which time I asked him to come back for a sleep study based on his history of snoring and witnessed apneas as well as daytime somnolence. His ESS at the time was 12/24. He was recently hospitalized for respiratory distress and was treated with positive airway pressure while in the hospital. He had a split-night sleep study on 01/23/2013 and I went over his test results with him in detail today. His baseline sleep efficiency was reduced at 69.3% with a latency to sleep of 9 minutes and wake after sleep onset of 23.5 minutes with severe sleep fragmentation noted. He had a increased percentage of stage I and stage II sleep, absence of slow-wave and absence of REM sleep prior to CPAP initiation. He had frequent and multifocal PVCs and one brief 4 beat episode of ventricular tachycardia and brief episode of trigeminy on single-lead EKG. based on these findings I referred him to cardiology. He has recent seen Dr. Eden Emms in cardiology on 03/25/13 and is scheduled to have a myocardial perfusion study on 04/14/13. He had an increase in Lamictal to 75 mg bid for 1 week, then 100 mg bid thereafter per NP visit yesterday. He had an AHI of 71 per hour with a baseline oxygen saturation noted to be only 89% and nadir of 78% during non-REM sleep. He was then titrated on CPAP from the pressure of 5-10 cm and utilization of EPR of 2. On the final pressure his AHI was reduced to 0 events per hour with supine REM sleep achieved. His  oxyhemoglobin desaturation nadir on the final pressure was 82%. I reviewed compliance data from 02/11/2013 to 02/19/2013 which is a total of 9 days during which he used it every day. Average usage was 3 hours and 30 minutes and percent use stays greater than 4 hours was 67% indicating fair compliance. His residual AHI was 3.2 per hour on a pressure of 10 with EPR of 2, indicating a appropriate treatment pressure. His most recent compliance data is from 02/11/2013 2 03/12/2013 which is a total of 30 days during which time he used it every day except for 3 days. His percent to use stays greater than 4 hours was 57% indicating mediocre compliance. He had an average usage for all days of 3 hours and 45 minutes and the average usage for the days he was on it was 4 hours and 10 minutes. His residual AHI is a little higher at 6.4 per hour. He was in the emergency room on 02/21/2013 for her shortness or breath. He recently had PFT results which indicated asthma. He follows with Dr. Craige Cotta in pulmonology.  We got the most recent download from his machine. This is from 02/11/2013 through 04/03/2013 which is a total of 52 days during which time he used it every day except for 4 days. His percent used days greater than 4 hours however was only 54%, indicating again mediocre compliance. Average usage for all days was 3 hours and 43 minutes and average usage 4 days on and was 4  hours and 2 minutes. His residual AHI was 7 on a pressure of 10 cm with EPR of 2. His leak at times was quite high. He may not use the mask correctly. He reports sleeping better with CPAP and feeling better rested. He still has chest congestion and coughing spells and has severe nasal congestion at times, which makes it hard for him to tolerate the CPAP. He would really like to use it more, but the congestion makes him take off the mask or sometimes he cannot use it at all.   His Past Medical History Is Significant For: Past Medical History  Diagnosis  Date  . Kidney calculi   . Hypertension   . History of TIAs   . Arthritis   . Back pain     trouble with lumbar 2, 3, and 4 - degenerative disease per pt  . Unspecified transient cerebral ischemia   . HA (headache)   . Syncope and collapse     His Past Surgical History Is Significant For: Past Surgical History  Procedure Laterality Date  . Total hip arthroplasty    . Joint replacement    . Knee surgery    . Hernia repair  2003    His Family History Is Significant For: Family History  Problem Relation Age of Onset  . Cancer Maternal Grandfather     prostate  . Diabetes Mother   . Heart Problems Father     His Social History Is Significant For: History   Social History  . Marital Status: Single    Spouse Name: N/A    Number of Children: 4  . Years of Education: 12   Occupational History  . disabled    Social History Main Topics  . Smoking status: Former Smoker -- 0.50 packs/day for 20 years    Types: Cigarettes    Quit date: 10/24/2012  . Smokeless tobacco: Never Used     Comment: three cigarettes daily  . Alcohol Use: No  . Drug Use: No  . Sexual Activity: No   Other Topics Concern  . None   Social History Narrative   Patient is disabled and not working.Patient is divorced and has four children. Patient drinks about three cups of coffee daily. Right handed. High school education.    His Allergies Are:  No Active Allergies:   His Current Medications Are:  Outpatient Encounter Prescriptions as of 04/04/2013  Medication Sig Dispense Refill  . albuterol (PROVENTIL HFA;VENTOLIN HFA) 108 (90 BASE) MCG/ACT inhaler Inhale 1-2 puffs into the lungs every 6 (six) hours as needed for wheezing.  1 Inhaler  0  . diltiazem (DILACOR XR) 240 MG 24 hr capsule Take 240 mg by mouth every morning.       . dipyridamole-aspirin (AGGRENOX) 200-25 MG per 12 hr capsule Take 1 capsule by mouth 2 (two) times daily.      . hydrochlorothiazide (HYDRODIURIL) 25 MG tablet Take 25  mg by mouth every morning.      Marland Kitchen ipratropium-albuterol (DUONEB) 0.5-2.5 (3) MG/3ML SOLN Take 3 mLs by nebulization every 4 (four) hours as needed (For shortness of breath).  360 mL  3  . lamoTRIgine (LAMICTAL) 25 MG tablet Increase to 75mg  BID for 1 week then 100mg  BID  240 tablet  3  . loratadine (CLARITIN) 10 MG tablet Take 10 mg by mouth every morning.      Marland Kitchen losartan (COZAAR) 50 MG tablet Take 50 mg by mouth every morning.      Marland Kitchen  omeprazole (PRILOSEC) 40 MG capsule Take 40 mg by mouth every morning.       . fluticasone (FLONASE) 50 MCG/ACT nasal spray Place 2 sprays into the nose daily.  16 g  5   No facility-administered encounter medications on file as of 04/04/2013.  :  Review of Systems  Constitutional: Positive for fatigue.  Respiratory: Positive for choking, shortness of breath and wheezing.   Cardiovascular: Positive for leg swelling.  Musculoskeletal: Positive for arthralgias, back pain, gait problem, joint swelling and myalgias.  Allergic/Immunologic: Positive for environmental allergies.  Neurological: Positive for dizziness, numbness and headaches.    Objective:  Neurologic Exam  Physical Exam Physical Examination:   Filed Vitals:   04/04/13 1038  BP: 148/91  Pulse: 78  Temp: 98 F (36.7 C)     General Examination: The patient is a very pleasant 57 y.o. male in no acute distress. He appears deconditioned and exhausted. He is adequately groomed. He is obese.   HEENT: Normocephalic, atraumatic, pupils are equal, round and reactive to light and accommodation. Extraocular tracking is good without limitation to gaze excursion or nystagmus noted. Normal smooth pursuit is noted. Hearing is grossly intact. Nasal inspection shows mild mucosal thickening and redness. Face is symmetric with normal facial animation and normal facial sensation. Speech is clear with no dysarthria noted. There is no hypophonia. There is no lip, neck/head, jaw or voice tremor. Neck is supple with  full range of passive and active motion. There are no carotid bruits on auscultation. Oropharynx exam reveals: moderate mouth dryness, adequate dental hygiene and moderate airway crowding, due to elongated uvula and narrow airway. Mallampati is class II. Tongue protrudes centrally and palate elevates symmetrically. Tonsils are 1+. Neck size is large.   Chest: shows expiratory wheezing and rhonchi, no crackles, and he has a few bouts of cough.  Heart: S1+S2+0, regular and normal without murmurs, rubs or gallops noted.   Abdomen: Soft, non-tender and non-distended with normal bowel sounds appreciated on auscultation.  Extremities: There is trace pitting edema in the distal lower extremities bilaterally. Pedal pulses are intact.  Skin: Warm and dry without trophic changes noted. There are no varicose veins.  Musculoskeletal: exam reveals no obvious joint deformities, tenderness or joint swelling or erythema.   Neurologically:  Mental status: The patient is awake, alert and oriented in all 4 spheres. His memory, attention, language and knowledge are appropriate. There is no aphasia, agnosia, apraxia or anomia. Speech is clear with normal prosody and enunciation. Thought process is linear. Mood is congruent and affect is normal.  Cranial nerves are as described above under HEENT exam. In addition, shoulder shrug is normal with equal shoulder height noted. Motor exam: Normal bulk, strength and tone is noted. There is no drift, tremor or rebound. Romberg is negative. Reflexes are 2+ throughout with the exception of an absent R ankle jerk. Fine motor skills are intact.  Cerebellar testing shows no dysmetria or intention tremor on finger to nose testing. Heel to shin is unremarkable bilaterally. There is no truncal or gait ataxia.  Sensory exam is unchanged.   Gait, station and balance: he stands up slowly with pain reported in his R hip and lower back. His R leg is longer and has a limp on the R,  unchanged.   Most of my 35 minute visit today was spent in counseling and coordination of care, reviewing test results and reviewing medication.  Assessment and Plan:   In summary, Scott Holden is a very  pleasant 58 y.o.-year old male with a complex medical history including TIA, chronic lung disease, chronic back pain, arthritis, syncope, headaches and recurrent spells of decreased attentiveness, suspicious for seizures, who presents for followup consultation of his severe obstructive sleep apnea. I talked to him about the sleep test results as well as his compliance data results. He is on CPAP of 10 cm with EPR of 2 and his latest compliance download indicates that his residual AH it is 7 per hour. To that end I am going to increase his CPAP pressure from 10 to11 cm and I will fax a new order to his DME company [facility]. His physical exam is fairly stable from the neurological standpoint, but his lung exam is worse for me. He is advised to ask Dr. Craige Cotta, if he would be a good candidate for supplemental O2 at night. He also has further cardiac w/u pending for later this month. While he indicates good results with the use of CPAP, he has difficulty with tolerance mostly because of nasal congestion. I reviewed his sleep test results and compliance data at length with him. I suggested a trial of Flonase nasal spray. I encouraged him to continue to use CPAP regularly to help reduce cardiovascular risk.   We also talked about trying to maintaining a healthy lifestyle in general. I encouraged the patient to eat healthy, exercise daily and keep well hydrated, to keep a scheduled bedtime and wake time routine, to not skip any meals and eat healthy snacks in between meals and to have protein with every meal. I stressed the importance of regular exercise.   I answered all his questions today and the patient was in agreement with the above outlined plan. I would like to see the patient back in 3 months, sooner if the need  arises and encouraged him to call with any interim questions, concerns, problems or updates and refill requests.

## 2013-04-04 NOTE — Telephone Encounter (Signed)
Needs to have ONO with CPAP and room air.

## 2013-04-07 ENCOUNTER — Encounter: Payer: Self-pay | Admitting: Neurology

## 2013-04-08 ENCOUNTER — Ambulatory Visit: Payer: Medicaid Other | Admitting: Neurology

## 2013-04-08 DIAGNOSIS — Z0289 Encounter for other administrative examinations: Secondary | ICD-10-CM

## 2013-04-09 ENCOUNTER — Encounter: Payer: Self-pay | Admitting: Cardiovascular Disease

## 2013-04-14 ENCOUNTER — Ambulatory Visit (HOSPITAL_COMMUNITY): Payer: Medicaid Other | Attending: Cardiology | Admitting: Radiology

## 2013-04-14 VITALS — BP 108/68 | Ht 70.0 in | Wt 304.0 lb

## 2013-04-14 DIAGNOSIS — R0609 Other forms of dyspnea: Secondary | ICD-10-CM | POA: Insufficient documentation

## 2013-04-14 DIAGNOSIS — Z8249 Family history of ischemic heart disease and other diseases of the circulatory system: Secondary | ICD-10-CM | POA: Insufficient documentation

## 2013-04-14 DIAGNOSIS — R0600 Dyspnea, unspecified: Secondary | ICD-10-CM

## 2013-04-14 DIAGNOSIS — Z8673 Personal history of transient ischemic attack (TIA), and cerebral infarction without residual deficits: Secondary | ICD-10-CM | POA: Insufficient documentation

## 2013-04-14 DIAGNOSIS — I4949 Other premature depolarization: Secondary | ICD-10-CM | POA: Insufficient documentation

## 2013-04-14 DIAGNOSIS — I10 Essential (primary) hypertension: Secondary | ICD-10-CM | POA: Insufficient documentation

## 2013-04-14 DIAGNOSIS — I779 Disorder of arteries and arterioles, unspecified: Secondary | ICD-10-CM | POA: Insufficient documentation

## 2013-04-14 DIAGNOSIS — R42 Dizziness and giddiness: Secondary | ICD-10-CM | POA: Insufficient documentation

## 2013-04-14 DIAGNOSIS — R002 Palpitations: Secondary | ICD-10-CM | POA: Insufficient documentation

## 2013-04-14 DIAGNOSIS — R9431 Abnormal electrocardiogram [ECG] [EKG]: Secondary | ICD-10-CM

## 2013-04-14 DIAGNOSIS — R55 Syncope and collapse: Secondary | ICD-10-CM | POA: Insufficient documentation

## 2013-04-14 DIAGNOSIS — I493 Ventricular premature depolarization: Secondary | ICD-10-CM

## 2013-04-14 DIAGNOSIS — R0602 Shortness of breath: Secondary | ICD-10-CM

## 2013-04-14 DIAGNOSIS — E785 Hyperlipidemia, unspecified: Secondary | ICD-10-CM | POA: Insufficient documentation

## 2013-04-14 DIAGNOSIS — Z87891 Personal history of nicotine dependence: Secondary | ICD-10-CM | POA: Insufficient documentation

## 2013-04-14 DIAGNOSIS — R0989 Other specified symptoms and signs involving the circulatory and respiratory systems: Secondary | ICD-10-CM | POA: Insufficient documentation

## 2013-04-14 MED ORDER — TECHNETIUM TC 99M SESTAMIBI GENERIC - CARDIOLITE
30.0000 | Freq: Once | INTRAVENOUS | Status: AC | PRN
  Administered 2013-04-14: 30 via INTRAVENOUS

## 2013-04-14 MED ORDER — REGADENOSON 0.4 MG/5ML IV SOLN
0.4000 mg | Freq: Once | INTRAVENOUS | Status: AC
  Administered 2013-04-14: 0.4 mg via INTRAVENOUS

## 2013-04-14 NOTE — Progress Notes (Signed)
I reviewed note and agree with plan.   Suanne Marker, MD 04/14/2013, 11:19 PM Certified in Neurology, Neurophysiology and Neuroimaging  [facility] [address] [place], [place] [address] [phone]

## 2013-04-14 NOTE — Progress Notes (Signed)
[facility] SITE 3 NUCLEAR MED [address] [place], [place] [address] [phone]    Cardiology Nuclear Med Study  Scott Holden is a 57 y.o. male     MRN : [id number]     DOB: 09-Mar-1956  Procedure Date: 04/14/2013  Nuclear Med Background Indication for Stress Test:  Evaluation for Ischemia and Abnormal EKG History:  No prior known history of CAD; '09 Myocardial Perfusion Imaging-Normal, EF=46%; 01-14-13 Echo: EF=55-60%; 01-2013 Admission for SOB,respiratory distress, PVC's Cardiac Risk Factors: Carotid Disease, Family History - CAD, History of Smoking, Hypertension, Lipids, TIA and ? history of seizures  Symptoms:  Dizziness, DOE, Near Syncope and Palpitations   Nuclear Pre-Procedure Caffeine/Decaff Intake:  None > 12 hrs NPO After: 8:00pm   Lungs:  Inspiratory/expiratory wheezing O2 Sat: 92% on room air. IV 0.9% NS with Angio Cath:  22g  IV Site: R Antecubital x 1, tolerated well IV Started by:  Irean Hong, RN  Chest Size (in):  50 Cup Size: n/a  Height: 5\' 10"  (1.778 m)  Weight:  304 lb (137.893 kg)  BMI:  Body mass index is 43.62 kg/(m^2). Tech Comments:  Held Aggrenox > 84 hrs. Neb. Treatment at 0600 today.Took Diltiazem and losartan this am. Irean Hong, RN    Nuclear Med Study 1 or 2 day study: 2 day  Stress Test Type:  Eugenie Birks  Reading MD: Cassell Clement, MD  Order Authorizing Provider:  Charlton Haws, MD  Resting Radionuclide: Technetium 27m Sestamibi  Resting Radionuclide Dose: 33.0 mCi on 04/18/13  Stress Radionuclide:  Technetium 61m Sestamibi  Stress Radionuclide Dose: 33.0 mCi 04/14/13          Stress Protocol Rest HR: 75 Stress HR: 90  Rest BP: 108/68 Stress BP: 131/51  Exercise Time (min): n/a METS: n/a   Predicted Max HR: 163 bpm % Max HR: 55.21 bpm Rate Pressure Product: 21308   Dose of Adenosine (mg):  n/a Dose of Lexiscan: 0.4 mg  Dose of Atropine (mg): n/a Dose of Dobutamine: n/a mcg/kg/min (at max HR)  Stress Test  Technologist: Milana Na, EMT-P  Nuclear Technologist:  Harlow Asa, CNMT     Rest Procedure:  Myocardial perfusion imaging was performed at rest 45 minutes following the intravenous administration of Technetium 5m Sestamibi. Rest ECG: NSR with non-specific ST-T wave changes  Stress Procedure:  The patient received IV Lexiscan 0.4 mg over 15-seconds.  Technetium 49m Sestamibi injected at 30-seconds.  This patient had sob and was warm with the Lexiscan injection. Quantitative spect images were obtained after a 45 minute delay. Stress ECG: No significant change from baseline ECG  QPS Raw Data Images:  Normal; no motion artifact; normal heart/lung ratio. Stress Images:  There is decreased uptake in the inferior wall. Rest Images:  There is decreased uptake in the inferior wall. Subtraction (SDS):  There is a fixed defect that is most consistent with a previous infarction. Transient Ischemic Dilatation (Normal <1.22):  1.03 Lung/Heart Ratio (Normal <0.45):  0.39 Quantitative Gated Spect Images QGS EDV:  131 ml QGS ESV:  53 ml  Impression Exercise Capacity:  Lexiscan with no exercise. BP Response:  Normal blood pressure response. Clinical Symptoms:  No chest pain. ECG Impression:  No significant ST segment change suggestive of ischemia. Comparison with Prior Nuclear Study: Since previous myoview 10/10/07, a small fixed inferolateral scar is now present. Since last study, EF has increased from 46% to 60%.  Overall Impression:  Low risk stress nuclear study.  There is a  small fixed perfusion defect of moderate severity involving the mid-inferior and mid-inferolateral segments. Cannot exclude diaphragmatic attenuation.  No reversible ischemia.   LV Ejection Fraction: 60% LV Wall Motion:  NL LV Function; NL Wall Motion  [employer]

## 2013-04-18 ENCOUNTER — Ambulatory Visit (HOSPITAL_COMMUNITY): Payer: Medicaid Other | Admitting: Radiology

## 2013-04-18 DIAGNOSIS — R0989 Other specified symptoms and signs involving the circulatory and respiratory systems: Secondary | ICD-10-CM

## 2013-04-18 MED ORDER — TECHNETIUM TC 99M SESTAMIBI GENERIC - CARDIOLITE
30.0000 | Freq: Once | INTRAVENOUS | Status: AC | PRN
  Administered 2013-04-18: 30 via INTRAVENOUS

## 2013-04-21 ENCOUNTER — Telehealth: Payer: Self-pay | Admitting: Pulmonary Disease

## 2013-04-21 DIAGNOSIS — G4733 Obstructive sleep apnea (adult) (pediatric): Secondary | ICD-10-CM

## 2013-04-21 NOTE — Telephone Encounter (Signed)
Results are in Dr. Evlyn Courier look at. Pt is aware we will call once these are reviewed. Please advise Dr. Craige Cotta thanks

## 2013-04-22 NOTE — Telephone Encounter (Signed)
Pt is aware of ONO results. Order will be placed for O2 with CPAP and repeat ONO with 2lpm.

## 2013-04-22 NOTE — Telephone Encounter (Signed)
ONO with CPAP and RA 04/16/13 >> Test time 6 hrs 7 min.  Mean SpO2 89.3%, low SpO2 80%.  Spent 3 hrs 19 min with SpO2 < 89%.    Please inform him that his oxygen level is low, and that we will arrange for supplemental oxygen at 2 liters.  Will repeat ONO with this set up.    Please also inform him that if his oxygen level at night does not improve with combination of CPAP and supplemental oxygen, then he may need alternative device for his sleep apnea and sleep related hypoventilation (ie BiPAP or ASV).  Will call him with results of repeat ONO with CPAP and 2 liters oxygen.  If he needs respiratory assist device (RAD) in place of CPAP then he will need to coordinate that with his sleep physician Dr. Huston Foley.  Will also route message to Dr. Frances Furbish.

## 2013-04-22 NOTE — Telephone Encounter (Signed)
Thank you, Dr. Craige Cotta! Sounds like a plan.

## 2013-04-29 ENCOUNTER — Telehealth: Payer: Self-pay | Admitting: Pulmonary Disease

## 2013-04-29 DIAGNOSIS — G4736 Sleep related hypoventilation in conditions classified elsewhere: Secondary | ICD-10-CM

## 2013-04-29 DIAGNOSIS — G4733 Obstructive sleep apnea (adult) (pediatric): Secondary | ICD-10-CM

## 2013-04-29 NOTE — Telephone Encounter (Signed)
Received ONO report with pt using 2 liters oxygen >> order mistakenly entered to have pt use 2 liters oxygen during ONO, but did not use CPAP at same time.  Will reschedule ONO with CPAP and 2 liters oxygen.

## 2013-05-01 ENCOUNTER — Other Ambulatory Visit: Payer: Self-pay

## 2013-05-05 ENCOUNTER — Encounter: Payer: Self-pay | Admitting: Pulmonary Disease

## 2013-05-12 ENCOUNTER — Other Ambulatory Visit: Payer: Self-pay

## 2013-05-12 MED ORDER — ASPIRIN-DIPYRIDAMOLE ER 25-200 MG PO CP12: 1.0000 | ORAL_CAPSULE | Freq: Two times a day (BID) | ORAL | Status: DC

## 2013-05-12 NOTE — Telephone Encounter (Signed)
Previous Love patient.  We have been prescribing Aggrenox since 2011.

## 2013-05-13 ENCOUNTER — Telehealth: Payer: Self-pay | Admitting: Pulmonary Disease

## 2013-05-13 DIAGNOSIS — G4733 Obstructive sleep apnea (adult) (pediatric): Secondary | ICD-10-CM

## 2013-05-13 DIAGNOSIS — G4736 Sleep related hypoventilation in conditions classified elsewhere: Secondary | ICD-10-CM

## 2013-05-13 NOTE — Telephone Encounter (Signed)
Pt is aware of ONO results. He is fine with proceeding with using 3 liters of O2 at night.

## 2013-05-13 NOTE — Telephone Encounter (Signed)
ONO with CPAP and 2 liters 05/02/13 >> Test time 4 hrs 24 min.  Basal SpO2 91.4%, low SpO2 65% (likely artifact).  Spent 22 min with SpO2 < 88%  Will have my nurse inform pt that ONO with CPAP and 2 liters looks better, but he is still having low oxygen saturation during portions of the night.  Will increase oxygen to 3 liters with CPAP and repeat ONO on this set up (I have placed order).  Will route message to Dr. Frances Furbish for her review.

## 2013-05-13 NOTE — Telephone Encounter (Signed)
Thanks, Vineet for keeping me posted.  Shawn Dannenberg

## 2013-05-14 ENCOUNTER — Telehealth: Payer: Self-pay | Admitting: Nurse Practitioner

## 2013-05-26 ENCOUNTER — Encounter: Payer: Self-pay | Admitting: *Deleted

## 2013-05-26 ENCOUNTER — Telehealth: Payer: Self-pay | Admitting: *Deleted

## 2013-05-26 NOTE — Telephone Encounter (Signed)
Calling for ok to stop aggrenox for surgery (one week prior).  [facility].   I called and LMVM for Sammi, re: what type of surgery or procedure.

## 2013-05-26 NOTE — Telephone Encounter (Signed)
This message was sent to the Admin pool when the original call came in on 11/19.  The call back today was sent to Rx pool.  I called the number/extension back that was given in note, got no answer.  Left message saying we received the message given to our front desk stating he needs to stop Aggrenox for 7 days, unsure if it was just an FYI or not.  Asked them to call us back if further assistance is needed.

## 2013-05-27 ENCOUNTER — Telehealth: Payer: Self-pay | Admitting: Pulmonary Disease

## 2013-05-27 NOTE — Telephone Encounter (Signed)
ONO with CPAP and 3 liters oxygen 05/16/13 >> Test time 4 hrs 45 min.  Basal SpO2 91.1%, low SpO2 84%.  Spent 9 min with SpO2 < 88%.  Spent 23.8% of test with SpO2 < 90%.  Will have my nurse inform pt that he still has oxygen desaturation in spite of increase in oxygen at night.  Will need to discuss sleep apnea set up with Dr. Frances Furbish.  Will route message to Dr. Frances Furbish, and need to determine if pt should be tried on BiPAP in place of CPAP and then re-assess need for supplemental oxygen.

## 2013-05-27 NOTE — Telephone Encounter (Signed)
Pt is aware of ONO results. 

## 2013-05-30 ENCOUNTER — Encounter: Payer: Self-pay | Admitting: Pulmonary Disease

## 2013-07-10 ENCOUNTER — Encounter: Payer: Self-pay | Admitting: Neurology

## 2013-07-10 ENCOUNTER — Ambulatory Visit (INDEPENDENT_AMBULATORY_CARE_PROVIDER_SITE_OTHER): Payer: Medicaid Other | Admitting: Neurology

## 2013-07-10 VITALS — BP 140/90 | HR 86 | Temp 97.4°F | Ht 70.0 in | Wt 313.0 lb

## 2013-07-10 DIAGNOSIS — J3489 Other specified disorders of nose and nasal sinuses: Secondary | ICD-10-CM

## 2013-07-10 DIAGNOSIS — Z9989 Dependence on other enabling machines and devices: Principal | ICD-10-CM

## 2013-07-10 DIAGNOSIS — G4733 Obstructive sleep apnea (adult) (pediatric): Secondary | ICD-10-CM

## 2013-07-10 DIAGNOSIS — R51 Headache: Secondary | ICD-10-CM

## 2013-07-10 DIAGNOSIS — R519 Headache, unspecified: Secondary | ICD-10-CM

## 2013-07-10 DIAGNOSIS — R0981 Nasal congestion: Secondary | ICD-10-CM

## 2013-07-10 DIAGNOSIS — G459 Transient cerebral ischemic attack, unspecified: Secondary | ICD-10-CM

## 2013-07-10 NOTE — Patient Instructions (Addendum)
Please continue using your CPAP regularly. While your insurance requires that you use CPAP at least 4 hours each night on 70% of the nights, I recommend, that you not skip any nights and use it throughout the night if you can. Getting used to CPAP does take time and patience and discipline. Untreated obstructive sleep apnea when it is moderate to severe can have an adverse impact on cardiovascular health and raise her risk for heart disease, arrhythmias, hypertension, congestive heart failure, stroke and diabetes. Untreated obstructive sleep apnea causes sleep disruption, nonrestorative sleep, and sleep deprivation. This can have an impact on your day to day functioning and cause daytime sleepiness and impairment of cognitive function, memory loss, mood disturbance, and problems focussing. Using CPAP regularly can improve these symptoms.  We will bring you back for another sleep study to treat you with CPAP and your oxygen but also to see if you might do better with a different treatment modality called BiPAP.    I will see you back after the sleep study.  Please look into using a salt water nasal rinse system, such as the EchoStar and follow the directions and my instructions. It may help your nasal congestion and help you tolerate your CPAP.

## 2013-07-10 NOTE — Progress Notes (Signed)
Subjective:    Scott Holden ID: Scott Holden is a 58 y.o. male.  HPI  Interim history:   Scott Holden is a very pleasant 58 year old gentleman with a complex medical history including TIA, COPD on oxygen, chronic back pain, arthritis, kidney stones, syncope, headaches, recurrent spells of decreased attentiveness, suspicious for seizures, who presents for followup consultation of Scott Holden severe obstructive sleep apnea. He is unaccompanied today. I last saw Scott Holden on 04/04/2013, at which time I talked to Scott Holden about Scott Holden split-night sleep study report as well as Scott Holden recent compliance data at Scott time. I increased Scott Holden CPAP pressure from 10-11 at Scott time based on Scott download report. He reported sleeping better with CPAP and feeling better rested. He still had chest congestion and coughing spells and had severe nasal congestion at times, which made it hard for Scott Holden to tolerate Scott CPAP. He indicated that he would like to use Scott machine more, but Scott congestion makes Scott Holden take off Scott mask or sometimes he cannot use it at all. He has in Scott interim seen Scott Holden lung doctor Dr. Halford Chessman in December and talked to Scott Holden about Scott Holden recent overnight pulse oximetry results, which indicated that while he is on CPAP and on supplemental oxygen at 3 L per minute, he still had desaturations and spent a total of 9 min with SpO2 < 88% and he spent 23.8% of test with SpO2 < 90%. Dr. Halford Chessman suggested evaluation for BiPAP therapy in lieu of CPAP therapy and reassessment of Scott Holden oxygen desaturations. On 04/14/2013 he had a nuclear medicine stress test without exercise and I reviewed those test results: Low risk stress nuclear study. There is a small fixed perfusion defect of moderate severity involving Scott mid-inferior and mid-inferolateral segments. Cannot exclude diaphragmatic attenuation. No reversible ischemia. LV Ejection Fraction: 60% LV Wall Motion: NL LV Function; NL Wall Motion.   Today, I reviewed Scott Holden compliance data from Scott machine: 06/10/2013  through 07/09/2013 which is Scott last 30 days, during which time he used CPAP every night. Scott Holden average usage was 3 hours and 30 minutes. Percent used days greater than 4 hours was 50% only. Scott Holden residual AHI is 3.2 per hour indicating a reasonable pressure setting at 11 cm with EPR of 2, however Scott Holden leak has been quite high at times. Today, he reports that he still struggles with nasal congestion, despite using Flonase. However, when he is able to tolerate Scott CPAP, he endorses sleeping much better and feeling well rested. He reports no new problems and no new medication changes, except for knee pain.  I first met Scott Holden on 01/23/2013 at which time I asked Scott Holden to come back for a sleep study based on Scott Holden history of snoring and witnessed apneas as well as daytime somnolence. He had a split-night sleep study on 01/23/2013 and I previously discussed Scott Holden test results with Scott Holden. Scott Holden baseline sleep efficiency was reduced at 69.3% with a latency to sleep of 9 minutes and wake after sleep onset of 23.5 minutes with severe sleep fragmentation noted. He had a increased percentage of stage I and stage II sleep, absence of slow-wave and absence of REM sleep prior to CPAP initiation. He had frequent and multifocal PVCs and one brief 4 beat episode of ventricular tachycardia and brief episode of trigeminy on single-lead EKG. Based on these findings I referred Scott Holden to cardiology.  He had an AHI of 71 per hour with a baseline oxygen saturation noted to be only 89% and nadir of 78% during  non-REM sleep. He was then titrated on CPAP from Scott pressure of 5-10 cm and utilization of EPR of 2. On Scott final pressure Scott Holden AHI was reduced to 0 events per hour with supine REM sleep achieved. Scott Holden oxyhemoglobin desaturation nadir on Scott final pressure was 82%. I reviewed compliance data from 02/11/2013 to 02/19/2013, total of 9 days during which he used it every day. Average usage was 3 hours and 30 minutes and percent use stays greater than 4  hours was 67% indicating fair compliance. Scott Holden residual AHI was 3.2 per hour on a pressure of 10 with EPR of 2, indicating a appropriate treatment pressure. Scott Holden compliance data from 02/11/2013 2 03/12/2013, showed he used CPAP every day except for 3 days. Scott Holden percent to use days greater than 4 hours was 57% indicating mediocre compliance. He had an average usage for all days of 3 hours and 45 minutes and Scott average usage for Scott days he was on it was 4 hours and 10 minutes. Scott Holden residual AHI was a little higher at 6.4 per hour. He was in Scott emergency room on 02/21/2013 for her shortness or breath. He recently had PFT results which indicated asthma.  Scott Holden compliance download from 02/11/2013 through 04/03/2013, indicated he used CPAP every day except for 4 days. Scott Holden percent used days greater than 4 hours however was only 54%, indicating again mediocre compliance. Average usage for all days was 3 hours and 43 minutes and average usage 4 days on and was 4 hours and 2 minutes. Scott Holden residual AHI was 7 on a pressure of 10 cm with EPR of 2. Scott Holden leak at times was quite high.   Scott Holden Past Medical History Is Significant For: Past Medical History  Diagnosis Date  . Kidney calculi   . Hypertension   . History of TIAs   . Arthritis   . Back pain     trouble with lumbar 2, 3, and 4 - degenerative disease per pt  . Unspecified transient cerebral ischemia   . HA (headache)   . Syncope and collapse     Scott Holden Past Surgical History Is Significant For: Past Surgical History  Procedure Laterality Date  . Total hip arthroplasty    . Joint replacement    . Knee surgery    . Hernia repair  2003    Scott Holden Family History Is Significant For: Family History  Problem Relation Age of Onset  . Cancer Maternal Grandfather     prostate  . Diabetes Mother   . Heart Problems Father     Scott Holden Social History Is Significant For: History   Social History  . Marital Status: Single    Spouse Name: N/A    Number of Children: 4   . Years of Education: 12   Occupational History  . disabled    Social History Main Topics  . Smoking status: Former Smoker -- 0.50 packs/day for 20 years    Types: Cigarettes    Quit date: 10/24/2012  . Smokeless tobacco: Never Used     Comment: three cigarettes daily  . Alcohol Use: No  . Drug Use: No  . Sexual Activity: No   Other Topics Concern  . None   Social History Narrative   Scott Holden is disabled and not working.Scott Holden is divorced and has four children. Scott Holden drinks about three cups of coffee daily. Right handed. High school education.    Scott Holden Allergies Are:  No Known Allergies:   Scott Holden Current Medications Are:  Outpatient Encounter  Prescriptions as of 07/10/2013  Medication Sig  . albuterol (PROVENTIL HFA;VENTOLIN HFA) 108 (90 BASE) MCG/ACT inhaler Inhale 1-2 puffs into Scott lungs every 6 (six) hours as needed for wheezing.  . diltiazem (DILACOR XR) 240 MG 24 hr capsule Take 240 mg by mouth every morning.   . dipyridamole-aspirin (AGGRENOX) 200-25 MG per 12 hr capsule Take 1 capsule by mouth 2 (two) times daily.  . fluticasone (FLONASE) 50 MCG/ACT nasal spray Place 2 sprays into Scott nose daily.  . hydrochlorothiazide (HYDRODIURIL) 25 MG tablet Take 25 mg by mouth every morning.  Marland Kitchen ipratropium-albuterol (DUONEB) 0.5-2.5 (3) MG/3ML SOLN Take 3 mLs by nebulization every 4 (four) hours as needed (For shortness of breath).  . lamoTRIgine (LAMICTAL) 25 MG tablet Increase to 45m BID for 1 week then 1064mBID  . loratadine (CLARITIN) 10 MG tablet Take 10 mg by mouth every morning.  . Marland Kitchenosartan (COZAAR) 50 MG tablet Take 50 mg by mouth every morning.  . Marland Kitchenmeprazole (PRILOSEC) 40 MG capsule Take 40 mg by mouth every morning.   :  Review of Systems:  Out of a complete 14 point review of systems, all are reviewed and negative with Scott exception of these symptoms as listed below:   Review of Systems  Eyes: Negative.   Respiratory: Negative.   Cardiovascular: Negative.    Gastrointestinal: Negative.   Endocrine: Negative.   Genitourinary: Negative.   Musculoskeletal: Positive for arthralgias, back pain, joint swelling and myalgias.       Muscle cramps  Skin: Negative.   Neurological: Positive for dizziness, weakness, numbness and headaches.  Hematological: Negative.   Psychiatric/Behavioral: Positive for sleep disturbance (apnea).    Objective:  Neurologic Exam  Physical Exam Physical Examination:   Filed Vitals:   07/10/13 0916  BP: 140/90  Pulse: 86  Temp: 97.4 F (36.3 C)     General Examination: Scott Holden is a very pleasant [age].o. male in no acute distress. He appears well today. He is adequately groomed. He is obese.   HEENT: Normocephalic, atraumatic, pupils are equal, round and reactive to light and accommodation. Extraocular tracking is good without limitation to gaze excursion or nystagmus noted. Normal smooth pursuit is noted. Hearing is grossly intact. Nasal inspection shows mild mucosal thickening and redness. Face is symmetric with normal facial animation and normal facial sensation. Speech is clear with no dysarthria noted. There is no hypophonia. There is no lip, neck/head, jaw or voice tremor. Neck is supple with full range of passive and active motion. There are no carotid bruits on auscultation. Oropharynx exam reveals: moderate mouth dryness, adequate dental hygiene and moderate airway crowding, due to elongated uvula and narrow airway. Mallampati is class II. Tongue protrudes centrally and palate elevates symmetrically. Tonsils are 1+. Neck size is large. Nasal inspection reveals no significant nasal mucosal bogginess or redness and no septal deviation. Scott Holden congestion may be higher up.   Chest: shows expiratory wheezing and rhonchi, no crackles, and he has a few bouts of cough.  Heart: S1+S2+0, regular and normal without murmurs, rubs or gallops noted.   Abdomen: Soft, non-tender and non-distended with normal bowel sounds  appreciated on auscultation.  Extremities: There is trace pitting edema in Scott distal lower extremities bilaterally.   Skin: Warm and dry without trophic changes noted. There are no varicose veins.  Musculoskeletal: exam reveals no obvious joint deformities, tenderness or joint swelling or erythema.   Neurologically:  Mental status: Scott Holden is awake, alert and oriented in all 4  spheres. Scott Holden memory, attention, language and knowledge are appropriate. There is no aphasia, agnosia, apraxia or anomia. Speech is clear with normal prosody and enunciation. Thought process is linear. Mood is congruent and affect is normal.  Cranial nerves are as described above under HEENT exam. In addition, shoulder shrug is normal with equal shoulder height noted. Motor exam: Normal bulk, strength and tone is noted. There is no drift, tremor or rebound. Romberg is negative. Reflexes are 2+ throughout with Scott exception of an absent R ankle jerk. Fine motor skills are intact.  Cerebellar testing shows no dysmetria or intention tremor on finger to nose testing. Heel to shin is unremarkable bilaterally. There is no truncal or gait ataxia.  Sensory exam is unchanged.   Gait, station and balance: he stands up slowly with pain reported in Scott Holden R hip and lower back. Scott Holden R leg is longer and has a limp on Scott R, unchanged.  Assessment and Plan:   In summary, Scott Holden is a very pleasant 58 year old male with a complex medical history including TIA, chronic lung disease, chronic back pain, arthritis, syncope, headaches and recurrent spells of decreased attentiveness, suspicious for seizures, who presents for followup consultation of Scott Holden severe obstructive sleep apnea, on CPAP.therapy. I talked to Scott Holden about Scott prior sleep test results as well as Scott Holden compliance data results. He is on CPAP of 11 cm with EPR of 2 and Scott Holden latest compliance download indicates that Scott Holden residual AHI is better since we increased Scott Holden pressure from 10  to 11 cm in 10/14, but he still has evidence of hypoxemia during a recent ONO. To that, end I am going to bring Scott Holden back for full night PAP titration to see if he may do better with BiPAP. Dr. Halford Chessman recommended Scott use of BiPAP and we will have to prove, that he indeed does better on bilevel pressures to ensure insurance coverage for treatment. He will continue O2 at 3 lpm during Scott titration. Scott Holden physical exam is fairly stable. He also had cardiac w/u, which I reviewed. While he indicates good results with Scott use of CPAP, he has difficulty with tolerance mostly because of nasal congestion. I asked Scott Holden to continue flonase and also start using a salt water nasal rinse system, such as Scott [employer].    We also talked about trying to maintaining a healthy lifestyle in general. I encouraged Scott Holden to eat healthy, exercise daily and keep well hydrated, to keep a scheduled bedtime and wake time routine, to not skip any meals and eat healthy snacks in between meals and to have protein with every meal. I stressed Scott importance of regular exercise. He used to take daily narcotic pain medication in Scott past for back pain, but not anymore.    I answered all Scott Holden questions today and Scott Holden was in agreement with Scott above outlined plan. I would like to see Scott Holden back after Scott sleep study and encouraged Scott Holden to call with any interim questions, concerns, problems or updates and refill requests.

## 2013-07-16 ENCOUNTER — Encounter: Payer: Self-pay | Admitting: Neurology

## 2013-07-22 ENCOUNTER — Ambulatory Visit (INDEPENDENT_AMBULATORY_CARE_PROVIDER_SITE_OTHER): Payer: Medicaid Other

## 2013-07-22 DIAGNOSIS — G4733 Obstructive sleep apnea (adult) (pediatric): Secondary | ICD-10-CM

## 2013-07-22 DIAGNOSIS — Z9989 Dependence on other enabling machines and devices: Principal | ICD-10-CM

## 2013-07-30 ENCOUNTER — Telehealth: Payer: Self-pay | Admitting: Neurology

## 2013-07-30 DIAGNOSIS — G4733 Obstructive sleep apnea (adult) (pediatric): Secondary | ICD-10-CM

## 2013-07-30 DIAGNOSIS — G4734 Idiopathic sleep related nonobstructive alveolar hypoventilation: Secondary | ICD-10-CM

## 2013-07-30 NOTE — Telephone Encounter (Signed)
Please call and inform patient that I have entered an order for treatment with PAP. He did fairly well during the latest sleep study with CPAP and I will increase his pressure to 13 cm and change his mask to a FFM. Please also make sure, the patient has a follow-up appointment with me in about 6 weeks from the setup date, thanks. Copy report to Dr. Craige Cotta.  Huston Foley, MD, PhD [facility])

## 2013-08-04 ENCOUNTER — Encounter: Payer: Self-pay | Admitting: *Deleted

## 2013-08-04 NOTE — Telephone Encounter (Signed)
I called and spoke with the patient about his recent sleep study results. I informed the patient that he did well on CPAP during the night of his study, and Dr. Frances Furbish his increased his pressure to 13 cm and changed his mask to a FFM. I will send the new orders to [facility] and I will fax a copy of the report to Dr. Evlyn Courier office

## 2013-10-07 ENCOUNTER — Ambulatory Visit: Payer: Medicaid Other | Admitting: Nurse Practitioner

## 2014-01-09 ENCOUNTER — Telehealth: Payer: Self-pay | Admitting: *Deleted

## 2014-01-09 NOTE — Telephone Encounter (Signed)
Called patient to r/s appointment time on 01/16/14 for 3:30 pm with NP CM, left message to return the call.

## 2014-01-09 NOTE — Telephone Encounter (Signed)
Patient returning call, appointment was changed to 01/15/14 at 8:30 am

## 2014-01-15 ENCOUNTER — Ambulatory Visit (INDEPENDENT_AMBULATORY_CARE_PROVIDER_SITE_OTHER): Payer: Medicaid Other | Admitting: Nurse Practitioner

## 2014-01-15 ENCOUNTER — Encounter: Payer: Self-pay | Admitting: Nurse Practitioner

## 2014-01-15 ENCOUNTER — Encounter: Payer: Self-pay | Admitting: Neurology

## 2014-01-15 VITALS — BP 158/97 | HR 94 | Ht 70.0 in | Wt 322.2 lb

## 2014-01-15 DIAGNOSIS — Z5181 Encounter for therapeutic drug level monitoring: Secondary | ICD-10-CM

## 2014-01-15 DIAGNOSIS — G4733 Obstructive sleep apnea (adult) (pediatric): Secondary | ICD-10-CM

## 2014-01-15 DIAGNOSIS — R51 Headache: Secondary | ICD-10-CM

## 2014-01-15 DIAGNOSIS — R519 Headache, unspecified: Secondary | ICD-10-CM

## 2014-01-15 DIAGNOSIS — R569 Unspecified convulsions: Secondary | ICD-10-CM

## 2014-01-15 MED ORDER — ASPIRIN-DIPYRIDAMOLE ER 25-200 MG PO CP12: 1.0000 | ORAL_CAPSULE | Freq: Two times a day (BID) | ORAL | Status: AC

## 2014-01-15 NOTE — Progress Notes (Addendum)
[facility]  PATIENT: Scott Holden DOB: March 24, 1956   REASON FOR VISIT: follow up for questionable seizures, OSA,Hx of TIA's   HISTORY OF PRESENT ILLNESS:Scott Holden, 58 year old white male returns for followup. He was last seen 07/10/13 by Dr. Frances Holden. 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 He has had no loss of vision or blackouts with these episodes until Novemeber 2013 when seen in the ER. He has approximately 1 episode every 2 months since placed on Lamictal.  He has had no falls recently  he has a chronic back condition that is treated by pain clinic in [facility].  He also has a history of headaches but has had no recent bad headaches, well-controlled on verapamil. He has history of sarcoidosis. He also has a history of hyperlipidemia which is followed by a cardiologist at [facility]. Gets no regular exercise.  His TIA episodes in the past have been right-sided numbness.  EEG  11/2012 was normal.  MRI of the brain did not show anything acute. Carotid duplex without significant stenosis.  He continues to complain of episodes where he just doesn't feel right, and is unable to communicate with his son when this occurs. He may stare off into space for a few moments but these episodes have decreased since being on Lamictal.  He has also been diagnosed with obstructive sleep apnea. He reports that his fatigue is much better being on CPAP. He brings in his downloaded information. He has not had recent labs. He returns for reevaluation     REVIEW OF SYSTEMS: Full 14 system review of systems performed and notable only for those listed, all others are neg:  Constitutional: Weight gain Cardiovascular: N/A  Ear/Nose/Throat: N/A  Skin: N/A  Eyes: N/A  Respiratory: N/A  Gastroitestinal: N/A  Hematology/Lymphatic: N/A  Endocrine: N/A Musculoskeletal: Joint pain, back pain Allergy/Immunology: N/A  Neurological: Rare headache, rare  numbness Psychiatric: N/A Sleep : Obstructive sleep apnea with CPAP  ALLERGIES: No Known Allergies  HOME MEDICATIONS: Outpatient Prescriptions Prior to Visit  Medication Sig Dispense Refill  . albuterol (PROVENTIL HFA;VENTOLIN HFA) 108 (90 BASE) MCG/ACT inhaler Inhale 1-2 puffs into the lungs every 6 (six) hours as needed for wheezing.  1 Inhaler  0  . diltiazem (DILACOR XR) 240 MG 24 hr capsule Take 240 mg by mouth every morning.       . dipyridamole-aspirin (AGGRENOX) 200-25 MG per 12 hr capsule Take 1 capsule by mouth 2 (two) times daily.  60 capsule  3  . fluticasone (FLONASE) 50 MCG/ACT nasal spray Place 2 sprays into the nose daily.  16 g  5  . hydrochlorothiazide (HYDRODIURIL) 25 MG tablet Take 25 mg by mouth every morning.      Marland Kitchen ipratropium-albuterol (DUONEB) 0.5-2.5 (3) MG/3ML SOLN Take 3 mLs by nebulization every 4 (four) hours as needed (For shortness of breath).  360 mL  3  . lamoTRIgine (LAMICTAL) 25 MG tablet Increase to 75mg  BID for 1 week then 100mg  BID  240 tablet  3  . loratadine (CLARITIN) 10 MG tablet Take 10 mg by mouth every morning.      Marland Kitchen losartan (COZAAR) 50 MG tablet Take 50 mg by mouth every morning.      Marland Kitchen omeprazole (PRILOSEC) 40 MG capsule Take 40 mg by mouth every morning.        No facility-administered medications prior to visit.    PAST MEDICAL HISTORY: Past Medical History  Diagnosis Date  . Kidney  calculi   . Hypertension   . History of TIAs   . Arthritis   . Back pain     trouble with lumbar 2, 3, and 4 - degenerative disease per pt  . Unspecified transient cerebral ischemia   . HA (headache)   . Syncope and collapse     PAST SURGICAL HISTORY: Past Surgical History  Procedure Laterality Date  . Total hip arthroplasty    . Joint replacement    . Knee surgery    . Hernia repair  2003    FAMILY HISTORY: Family History  Problem Relation Age of Onset  . Cancer Maternal Grandfather     prostate  . Diabetes Mother   . Heart Problems  Father     SOCIAL HISTORY: History   Social History  . Marital Status: Single    Spouse Name: N/A    Number of Children: 4  . Years of Education: 12   Occupational History  . disabled    Social History Main Topics  . Smoking status: Former Smoker -- 0.50 packs/day for 20 years    Types: Cigarettes    Quit date: 10/24/2012  . Smokeless tobacco: Never Used     Comment: three cigarettes daily  . Alcohol Use: No  . Drug Use: No  . Sexual Activity: No   Other Topics Concern  . Not on file   Social History Narrative   Patient is disabled and not working.Patient is divorced and has four children. Patient drinks about three cups of coffee daily. Right handed. High school education.     PHYSICAL EXAM  Filed Vitals:   01/15/14 0836  BP: 158/97  Pulse: 94  Height: 5\' 10"  (1.778 m)  Weight: 322 lb 3.2 oz (146.149 kg)   Body mass index is 46.23 kg/(m^2). Generalized: Well developed, obese male in no acute distress  Head: normocephalic and atraumatic,. Oropharynx benign  Neck: Supple, no carotid bruits  Cardiac: Regular rate rhythm,  Neurological examination  Mentation: Alert oriented to time, place, history taking. Follows all commands speech and language fluent ESS =6 Cranial nerve II-XII: Pupils were equal round reactive to light extraocular movements were full, visual field were full on confrontational test. Facial sensation and strength were normal. hearing was intact to finger rubbing bilaterally. Uvula tongue midline. head turning and shoulder shrug and were normal and symmetric.Tongue protrusion into cheek strength was normal.  Motor: normal bulk and tone, full strength in the BUE, BLE, fine finger movements normal, no pronator drift. No focal weakness  Sensory: normal and symmetric to light touch, pinprick, and vibration  Coordination: finger-nose-finger, heel-to-shin bilaterally, no dysmetria  Reflexes: Brachioradialis 2/2, biceps 2/2, triceps 2/2, patellar 2/2,  Achilles absent right ankle,  plantar responses were flexor bilaterally.  Gait and Station: Rising up from seated position without assistance, wide based, moderate stride, good arm swing, smooth turning, able to perform tiptoe, and heel walking without difficulty. Tandem gait steady no assistive device   DIAGNOSTIC DATA (LABS, IMAGING, TESTING) - I reviewed patient records, labs, notes, testing and imaging myself where available.  ASSESSMENT AND PLAN  58 y.o. year old male  has a past medical history of Kidney calculi; Hypertension; History of TIAs; Arthritis; Back pain; Unspecified transient cerebral ischemia; HA (headache); and Syncope and collapse, recurrent spells of decreased attentiveness suspicious for seizure and obstructive sleep apnea.  Dr. Frances FurbishAthar reviewed compliance report. 08/07/13-09/05/13. and use > than 4 hours was 57%. AHI 3.4. But with pressure leak 44.9.Needs new mask and  needs to use > than 4 hours every night.  Check with equipment company and get new CPAP mask Continue Agggrenox will refill Continue Lamictal, will check level today and CBC, CMPand refill meds after labs back F/U in 6 months Nilda Riggs, [facility], [facility], APRN  [facility] [address] [place], [place] [address] [phone]  I reviewed the above note and documentation by the Nurse Practitioner and agree with the history, physical exam, assessment and plan as outlined above. Huston Foley, MD, PhD [facility])

## 2014-01-15 NOTE — Patient Instructions (Signed)
Check with equipment company and get new CPAP mask Continue Agggrenox will refill Continue Lamictal, will check level today and CBC, CMP F/U in 6 months

## 2014-01-16 ENCOUNTER — Ambulatory Visit: Payer: Medicaid Other | Admitting: Nurse Practitioner

## 2014-01-18 LAB — COMPREHENSIVE METABOLIC PANEL
ALK PHOS: 73 IU/L (ref 39–117)
ALT: 18 IU/L (ref 0–44)
AST: 21 IU/L (ref 0–40)
Albumin/Globulin Ratio: 1.8 (ref 1.1–2.5)
Albumin: 4.4 g/dL (ref 3.5–5.5)
BILIRUBIN TOTAL: 0.4 mg/dL (ref 0.0–1.2)
BUN/Creatinine Ratio: 10 (ref 9–20)
BUN: 9 mg/dL (ref 6–24)
CHLORIDE: 99 mmol/L (ref 97–108)
CO2: 20 mmol/L (ref 18–29)
Calcium: 9.3 mg/dL (ref 8.7–10.2)
Creatinine, Ser: 0.9 mg/dL (ref 0.76–1.27)
GFR calc non Af Amer: 94 mL/min/{1.73_m2} (ref >59)
GFR, EST AFRICAN AMERICAN: 108 mL/min/{1.73_m2} (ref >59)
GLUCOSE: 121 mg/dL — AB (ref 65–99)
Globulin, Total: 2.4 g/dL (ref 1.5–4.5)
POTASSIUM: 4.5 mmol/L (ref 3.5–5.2)
Sodium: 140 mmol/L (ref 134–144)
Total Protein: 6.8 g/dL (ref 6.0–8.5)

## 2014-01-18 LAB — LAMOTRIGINE LEVEL: Lamotrigine Lvl: NOT DETECTED ug/mL (ref 2.0–20.0)

## 2014-01-18 LAB — CBC WITH DIFFERENTIAL/PLATELET
BASOS ABS: 0 10*3/uL (ref 0.0–0.2)
Basos: 0 %
EOS ABS: 0.3 10*3/uL (ref 0.0–0.4)
Eos: 3 %
HCT: 43.3 % (ref 37.5–51.0)
Hemoglobin: 14.4 g/dL (ref 12.6–17.7)
Immature Grans (Abs): 0 10*3/uL (ref 0.0–0.1)
Immature Granulocytes: 0 %
LYMPHS: 29 %
Lymphocytes Absolute: 3.2 10*3/uL — ABNORMAL HIGH (ref 0.7–3.1)
MCH: 29.6 pg (ref 26.6–33.0)
MCHC: 33.3 g/dL (ref 31.5–35.7)
MCV: 89 fL (ref 79–97)
Monocytes Absolute: 0.6 10*3/uL (ref 0.1–0.9)
Monocytes: 6 %
Neutrophils Absolute: 7 10*3/uL (ref 1.4–7.0)
Neutrophils Relative %: 62 %
RBC: 4.86 x10E6/uL (ref 4.14–5.80)
RDW: 15.2 % (ref 12.3–15.4)
WBC: 11.3 10*3/uL — ABNORMAL HIGH (ref 3.4–10.8)

## 2014-01-19 ENCOUNTER — Other Ambulatory Visit: Payer: Self-pay | Admitting: *Deleted

## 2014-01-19 DIAGNOSIS — R569 Unspecified convulsions: Secondary | ICD-10-CM

## 2014-01-19 NOTE — Progress Notes (Signed)
Quick Note:  Pt called and given the results of the lab. Informed looked good, except lamictal level showed no detectable level. Take as directed at 100 mg po bid and repeat lab in 2 wks around 02-02-14. Bring am dose with you and take after lab drawn, bring crackers if need something on his stomache. He verbalized understanding. ______

## 2014-02-04 ENCOUNTER — Telehealth: Payer: Self-pay | Admitting: Nurse Practitioner

## 2014-02-04 NOTE — Telephone Encounter (Signed)
Patient needs repeat Lamictal level

## 2014-02-04 NOTE — Telephone Encounter (Signed)
Informed patient that he needs to come to office to have labs drawn. The order is in the system. Patient says he will come by tomorrow.

## 2014-02-05 ENCOUNTER — Other Ambulatory Visit (INDEPENDENT_AMBULATORY_CARE_PROVIDER_SITE_OTHER): Payer: Self-pay

## 2014-02-05 DIAGNOSIS — R569 Unspecified convulsions: Secondary | ICD-10-CM

## 2014-02-05 DIAGNOSIS — Z0289 Encounter for other administrative examinations: Secondary | ICD-10-CM

## 2014-02-07 LAB — LAMOTRIGINE LEVEL: Lamotrigine Lvl: 3.7 ug/mL (ref 2.0–20.0)

## 2014-03-14 ENCOUNTER — Other Ambulatory Visit: Payer: Self-pay | Admitting: Nurse Practitioner

## 2014-07-20 ENCOUNTER — Ambulatory Visit: Payer: Medicaid Other | Admitting: Diagnostic Neuroimaging

## 2014-07-20 ENCOUNTER — Ambulatory Visit: Payer: Medicaid Other | Admitting: Nurse Practitioner

## 2014-07-28 ENCOUNTER — Ambulatory Visit: Payer: Medicaid Other | Admitting: Diagnostic Neuroimaging

## 2014-11-06 ENCOUNTER — Encounter (HOSPITAL_COMMUNITY): Payer: Self-pay | Admitting: Emergency Medicine

## 2014-11-06 ENCOUNTER — Emergency Department (HOSPITAL_COMMUNITY)
Admission: EM | Admit: 2014-11-06 | Discharge: 2014-11-06 | Disposition: A | Discharge status: Home / Self Care | Payer: Medicaid Other | Attending: Emergency Medicine | Admitting: Emergency Medicine

## 2014-11-06 ENCOUNTER — Emergency Department (HOSPITAL_COMMUNITY): Payer: Medicaid Other

## 2014-11-06 DIAGNOSIS — I10 Essential (primary) hypertension: Secondary | ICD-10-CM | POA: Diagnosis not present

## 2014-11-06 DIAGNOSIS — N23 Unspecified renal colic: Secondary | ICD-10-CM

## 2014-11-06 DIAGNOSIS — M1990 Unspecified osteoarthritis, unspecified site: Secondary | ICD-10-CM | POA: Insufficient documentation

## 2014-11-06 DIAGNOSIS — Z87891 Personal history of nicotine dependence: Secondary | ICD-10-CM | POA: Insufficient documentation

## 2014-11-06 DIAGNOSIS — R109 Unspecified abdominal pain: Secondary | ICD-10-CM | POA: Diagnosis present

## 2014-11-06 DIAGNOSIS — N201 Calculus of ureter: Secondary | ICD-10-CM | POA: Diagnosis not present

## 2014-11-06 DIAGNOSIS — Z79899 Other long term (current) drug therapy: Secondary | ICD-10-CM | POA: Insufficient documentation

## 2014-11-06 DIAGNOSIS — Z8673 Personal history of transient ischemic attack (TIA), and cerebral infarction without residual deficits: Secondary | ICD-10-CM | POA: Diagnosis not present

## 2014-11-06 DIAGNOSIS — Z7951 Long term (current) use of inhaled steroids: Secondary | ICD-10-CM | POA: Insufficient documentation

## 2014-11-06 LAB — CBC WITH DIFFERENTIAL/PLATELET
Basophils Absolute: 0 10*3/uL (ref 0.0–0.1)
Basophils Relative: 0 % (ref 0–1)
Eosinophils Absolute: 0.3 10*3/uL (ref 0.0–0.7)
Eosinophils Relative: 2 % (ref 0–5)
HCT: 44.3 % (ref 39.0–52.0)
HEMOGLOBIN: 14.8 g/dL (ref 13.0–17.0)
LYMPHS PCT: 28 % (ref 12–46)
Lymphs Abs: 4.7 10*3/uL — ABNORMAL HIGH (ref 0.7–4.0)
MCH: 29.5 pg (ref 26.0–34.0)
MCHC: 33.4 g/dL (ref 30.0–36.0)
MCV: 88.2 fL (ref 78.0–100.0)
MONO ABS: 1.1 10*3/uL — AB (ref 0.1–1.0)
Monocytes Relative: 6 % (ref 3–12)
Neutro Abs: 10.6 10*3/uL — ABNORMAL HIGH (ref 1.7–7.7)
Neutrophils Relative %: 64 % (ref 43–77)
Platelets: 298 10*3/uL (ref 150–400)
RBC: 5.02 MIL/uL (ref 4.22–5.81)
RDW: 14.3 % (ref 11.5–15.5)
WBC: 16.7 10*3/uL — ABNORMAL HIGH (ref 4.0–10.5)

## 2014-11-06 LAB — COMPREHENSIVE METABOLIC PANEL
ALT: 14 U/L — AB (ref 17–63)
AST: 14 U/L — ABNORMAL LOW (ref 15–41)
Albumin: 3.8 g/dL (ref 3.5–5.0)
Alkaline Phosphatase: 71 U/L (ref 38–126)
Anion gap: 4 — ABNORMAL LOW (ref 5–15)
BILIRUBIN TOTAL: 0.5 mg/dL (ref 0.3–1.2)
BUN: 12 mg/dL (ref 6–20)
CALCIUM: 9 mg/dL (ref 8.9–10.3)
CHLORIDE: 106 mmol/L (ref 101–111)
CO2: 27 mmol/L (ref 22–32)
Creatinine, Ser: 0.91 mg/dL (ref 0.61–1.24)
GFR calc Af Amer: >60 mL/min (ref >60)
GLUCOSE: 94 mg/dL (ref 65–99)
POTASSIUM: 4 mmol/L (ref 3.5–5.1)
SODIUM: 137 mmol/L (ref 135–145)
TOTAL PROTEIN: 7.2 g/dL (ref 6.5–8.1)

## 2014-11-06 LAB — URINALYSIS, ROUTINE W REFLEX MICROSCOPIC
BILIRUBIN URINE: NEGATIVE
GLUCOSE, UA: NEGATIVE mg/dL
KETONES UR: NEGATIVE mg/dL
LEUKOCYTES UA: NEGATIVE
NITRITE: NEGATIVE
PROTEIN: NEGATIVE mg/dL
Specific Gravity, Urine: 1.008 (ref 1.005–1.030)
Urobilinogen, UA: 1 mg/dL (ref 0.0–1.0)
pH: 6.5 (ref 5.0–8.0)

## 2014-11-06 LAB — URINE MICROSCOPIC-ADD ON

## 2014-11-06 LAB — LIPASE, BLOOD: LIPASE: 14 U/L — AB (ref 22–51)

## 2014-11-06 MED ORDER — ONDANSETRON 8 MG PO TBDP: 8.0000 mg | ORAL_TABLET | Freq: Three times a day (TID) | ORAL | Status: DC | PRN

## 2014-11-06 MED ORDER — ONDANSETRON 4 MG PO TBDP
4.0000 mg | ORAL_TABLET | Freq: Once | ORAL | Status: AC
  Administered 2014-11-06: 4 mg via ORAL
  Filled 2014-11-06: qty 1

## 2014-11-06 MED ORDER — TAMSULOSIN HCL 0.4 MG PO CAPS: 0.4000 mg | ORAL_CAPSULE | Freq: Every day | ORAL | Status: DC

## 2014-11-06 MED ORDER — ONDANSETRON 8 MG PO TBDP
8.0000 mg | ORAL_TABLET | Freq: Once | ORAL | Status: AC
  Administered 2014-11-06: 8 mg via ORAL
  Filled 2014-11-06: qty 1

## 2014-11-06 MED ORDER — HYDROCODONE-ACETAMINOPHEN 5-325 MG PO TABS
2.0000 | ORAL_TABLET | Freq: Once | ORAL | Status: AC
  Administered 2014-11-06: 2 via ORAL
  Filled 2014-11-06: qty 2

## 2014-11-06 MED ORDER — KETOROLAC TROMETHAMINE 60 MG/2ML IM SOLN
60.0000 mg | Freq: Once | INTRAMUSCULAR | Status: AC
  Administered 2014-11-06: 60 mg via INTRAMUSCULAR
  Filled 2014-11-06: qty 2

## 2014-11-06 MED ORDER — HYDROCODONE-ACETAMINOPHEN 5-325 MG PO TABS: 1.0000 | ORAL_TABLET | Freq: Four times a day (QID) | ORAL | Status: DC | PRN

## 2014-11-06 NOTE — ED Provider Notes (Signed)
CSN: [id number]     Arrival date & time 11/06/14  1859 History   First MD Initiated Contact with Patient 11/06/14 1918     Chief Complaint  Patient presents with  . Nephrolithiasis  . Flank Pain    right     (Consider location/radiation/quality/duration/timing/severity/associated sxs/prior Treatment) HPI Comments: PT comes in with cc of flank pain. Pt has right sided groin pain, radiating to the scrotal region that started today. Pain is colicky, and waxing and waning, similar to her previous stone pain. There is mild nausea, no emesis. No uti like sx. Pt has had 20+ episodes of renal stones, has needed urologic intervention only on a couple of occasions. Denies any fevers, chills.   Patient is a 59 y.o. male presenting with flank pain. The history is provided by the patient.  Flank Pain Pertinent negatives include no chest pain, no abdominal pain and no shortness of breath.    Past Medical History  Diagnosis Date  . Kidney calculi   . Hypertension   . History of TIAs   . Arthritis   . Back pain     trouble with lumbar 2, 3, and 4 - degenerative disease per pt  . Unspecified transient cerebral ischemia   . HA (headache)   . Syncope and collapse    Past Surgical History  Procedure Laterality Date  . Total hip arthroplasty    . Joint replacement    . Knee surgery    . Hernia repair  2003   Family History  Problem Relation Age of Onset  . Cancer Maternal Grandfather     prostate  . Diabetes Mother   . Heart Problems Father    History  Substance Use Topics  . Smoking status: Former Smoker -- 0.50 packs/day for 20 years    Types: Cigarettes    Quit date: 10/24/2012  . Smokeless tobacco: Never Used     Comment: three cigarettes daily  . Alcohol Use: No    Review of Systems  Constitutional: Positive for activity change.  Respiratory: Negative for cough and shortness of breath.   Cardiovascular: Negative for chest pain.  Gastrointestinal: Negative for abdominal  pain.  Genitourinary: Positive for flank pain. Negative for dysuria and hematuria.      Allergies  Review of patient's allergies indicates no known allergies.  Home Medications   Prior to Admission medications   Medication Sig Start Date End Date Taking? Authorizing Provider  albuterol (PROVENTIL HFA;VENTOLIN HFA) 108 (90 BASE) MCG/ACT inhaler Inhale 1-2 puffs into the lungs every 6 (six) hours as needed for wheezing. 11/27/12  Yes Derwood Kaplan, MD  diltiazem (CARDIZEM CD) 360 MG 24 hr capsule Take 360 mg by mouth every morning. 09/05/14  Yes Historical Provider, MD  dipyridamole-aspirin (AGGRENOX) 200-25 MG per 12 hr capsule Take 1 capsule by mouth 2 (two) times daily. 01/15/14  Yes Nilda Riggs, NP  fluticasone [facility]) 50 MCG/ACT nasal spray Place 2 sprays into the nose daily. 04/04/13  Yes Huston Foley, MD  hydrochlorothiazide (HYDRODIURIL) 25 MG tablet Take 25 mg by mouth every morning.   Yes Historical Provider, MD  ipratropium-albuterol (DUONEB) 0.5-2.5 (3) MG/3ML SOLN Take 3 mLs by nebulization every 4 (four) hours as needed (For shortness of breath). 01/15/13  Yes Laveda Norman, MD  lamoTRIgine (LAMICTAL) 25 MG tablet TAKE 3 TABLETS BY MOUTH TWICE A DAY FOR 1 WEEK THEN TAKE 4 TABLETS BY MOUTH TWICE A DAY Patient taking differently: Take 3 tablets by mouth twice daily  for 1 week then take 4 tablets by mouth twice a day. 03/17/14  Yes Nilda Riggs, NP  loratadine (CLARITIN) 10 MG tablet Take 10 mg by mouth every morning.   Yes Historical Provider, MD  losartan (COZAAR) 50 MG tablet Take 50 mg by mouth every morning.   Yes Historical Provider, MD  metFORMIN (GLUCOPHAGE) 500 MG tablet Take 1 tablet by mouth 2 (two) times daily. 10/22/14  Yes Historical Provider, MD  omeprazole (PRILOSEC) 40 MG capsule Take 40 mg by mouth every morning.    Yes Historical Provider, MD  diltiazem (DILACOR XR) 240 MG 24 hr capsule Take 240 mg by mouth every morning.     Historical Provider, MD   fentaNYL (DURAGESIC - DOSED MCG/HR) 25 MCG/HR patch Place 25 mcg onto the skin every 3 (three) days.    Historical Provider, MD  HYDROcodone-acetaminophen (NORCO/VICODIN) 5-325 MG per tablet Take 1 tablet by mouth every 6 (six) hours as needed. 11/06/14   Derwood Kaplan, MD  ondansetron (ZOFRAN ODT) 8 MG disintegrating tablet Take 1 tablet (8 mg total) by mouth every 8 (eight) hours as needed for nausea. 11/06/14   Derwood Kaplan, MD  oxyCODONE-acetaminophen (PERCOCET) 10-325 MG per tablet Take 1 tablet by mouth every 4 (four) hours as needed for pain.    Historical Provider, MD  tamsulosin (FLOMAX) 0.4 MG CAPS capsule Take 1 capsule (0.4 mg total) by mouth daily. 11/06/14   Jenevie Casstevens, MD   BP 151/101 mmHg  Pulse 71  Temp(Src) 98.7 F (37.1 C) (Oral)  Resp 0  SpO2 92% Physical Exam  Constitutional: He is oriented to person, place, and time. He appears well-developed.  HENT:  Head: Normocephalic and atraumatic.  Eyes: Conjunctivae and EOM are normal. Pupils are equal, round, and reactive to light.  Neck: Normal range of motion. Neck supple.  Cardiovascular: Normal rate and regular rhythm.   Pulmonary/Chest: Effort normal and breath sounds normal.  Abdominal: Soft. Bowel sounds are normal. He exhibits no distension. There is tenderness. There is no rebound and no guarding.  Mild rlq tenderness  Neurological: He is alert and oriented to person, place, and time.  Skin: Skin is warm.  Nursing note and vitals reviewed.   ED Course  Procedures (including critical care time) Labs Review Labs Reviewed  CBC WITH DIFFERENTIAL/PLATELET - Abnormal; Notable for the following:    WBC 16.7 (*)    Neutro Abs 10.6 (*)    Lymphs Abs 4.7 (*)    Monocytes Absolute 1.1 (*)    All other components within normal limits  COMPREHENSIVE METABOLIC PANEL - Abnormal; Notable for the following:    AST 14 (*)    ALT 14 (*)    Anion gap 4 (*)    All other components within normal limits  LIPASE, BLOOD -  Abnormal; Notable for the following:    Lipase 14 (*)    All other components within normal limits  URINALYSIS, ROUTINE W REFLEX MICROSCOPIC - Abnormal; Notable for the following:    APPearance CLOUDY (*)    Hgb urine dipstick LARGE (*)    All other components within normal limits  URINE MICROSCOPIC-ADD ON    Imaging Review US Renal  11/06/2014   CLINICAL DATA:  Right flank pain.  History of nephrolithiasis.  EXAM: RENAL / URINARY TRACT ULTRASOUND COMPLETE  COMPARISON:  None.  FINDINGS: Right Kidney:  Length: 14.3 cm. Echogenicity within normal limits. No mass or hydronephrosis visualized. There is a probable 0.7 cm nonobstructing stone in  the midpole right kidney.  Left Kidney:  Length: 12.1 cm. Echogenicity within normal limits. No mass or hydronephrosis visualized.  Bladder:  There is debris within the bladder.  IMPRESSION: Probable nonobstructing stone in the right kidney. No hydronephrosis bilaterally.  Debris identified within the bladder.  This can be seen in cystitis.   Electronically Signed   By: Sherian Rein M.D.   On: 11/06/2014 21:25     EKG Interpretation None      MDM   Final diagnoses:  Ureteral colic   Pt comes in with cc of right sided groin pain. Hx of renal stones, and pt is having similar pain. [place] ordered - and there is no hydronephrosis. Pain is waxing and waning, and colicky - so based on hx it appears to be stone as well. Negative [place]. Pain is in relative control post im toradol. Will d/c with pain meds and urology f/u. Pt advised to come to the Er if there is fevers, worsening pain, inability to keep meds down.      Derwood Kaplan, MD 11/06/14 2253

## 2014-11-06 NOTE — ED Notes (Signed)
Pt c/o right lower flank pain. Is usually able to pass kidney stones on his own but says this time he cannot. Denies dysuria but is having urinary "hesitancy." Has seen [facility] in the past. Hx kidney stones. Endorses nausea. Has not had any pain medications in the past 6 hours. No other c/c.

## 2014-11-06 NOTE — Discharge Instructions (Signed)
We saw you in the ER for the abdominal pain. °Our results indicate that you have a kidney stone. °We were able to get your pain is relative control, and we can safely send you home. ° °Take the meds prescribed. °Set up an appointment with the Urologist. °If the pain is unbearable, you start having fevers, chills, and are unable to keep any meds down - then return to the ER. ° ° ° °Ureteral Colic (Kidney Stones) °Ureteral colic is the result of a condition when kidney stones form inside the kidney. Once kidney stones are formed they may move into the tube that connects the kidney with the bladder (ureter). If this occurs, this condition may cause pain (colic) in the ureter.  °CAUSES  °Pain is caused by stone movement in the ureter and the obstruction caused by the stone. °SYMPTOMS  °The pain comes and goes as the ureter contracts around the stone. The pain is usually intense, sharp, and stabbing in character. The location of the pain may move as the stone moves through the ureter. When the stone is near the kidney the pain is usually located in the back and radiates to the belly (abdomen). When the stone is ready to pass into the bladder the pain is often located in the lower abdomen on the side the stone is located. At this location, the symptoms may mimic those of a urinary tract infection with urinary frequency. Once the stone is located here it often passes into the bladder and the pain disappears completely. °TREATMENT  °· Your caregiver will provide you with medicine for pain relief. °· You may require specialized follow-up X-rays. °· The absence of pain does not always mean that the stone has passed. It may have just stopped moving. If the urine remains completely obstructed, it can cause loss of kidney function or even complete destruction of the involved kidney. It is your responsibility and in your interest that X-rays and follow-ups as suggested by your caregiver are completed. Relief of pain without  passage of the stone can be associated with severe damage to the kidney, including loss of kidney function on that side. °· If your stone does not pass on its own, additional measures may be taken by your caregiver to ensure its removal. °HOME CARE INSTRUCTIONS  °· Increase your fluid intake. Water is the preferred fluid since juices containing vitamin C may acidify the urine making it less likely for certain stones (uric acid stones) to pass. °· Strain all urine. A strainer will be provided. Keep all particulate matter or stones for your caregiver to inspect. °· Take your pain medicine as directed. °· Make a follow-up appointment with your caregiver as directed. °· Remember that the goal is passage of your stone. The absence of pain does not mean the stone is gone. Follow your caregiver's instructions. °· Only take over-the-counter or prescription medicines for pain, discomfort, or fever as directed by your caregiver. °SEEK MEDICAL CARE IF:  °· Pain cannot be controlled with the prescribed medicine. °· You have a fever. °· Pain continues for longer than your caregiver advises it should. °· There is a change in the pain, and you develop chest discomfort or constant abdominal pain. °· You feel faint or pass out. °MAKE SURE YOU:  °· Understand these instructions. °· Will watch your condition. °· Will get help right away if you are not doing well or get worse. °Document Released: 03/22/2005 Document Revised: 10/07/2012 Document Reviewed: 12/07/2010 °Exit[employer]® Patient Information ©2015 [employer]   [employer]. This information is not intended to replace advice given to you by your health care provider. Make sure you discuss any questions you have with your health care provider. ° °

## 2014-11-06 NOTE — ED Notes (Signed)
Bed: NZ97 Expected date:  Expected time:  Means of arrival:  Comments: Triage 9

## 2014-11-09 ENCOUNTER — Encounter (HOSPITAL_BASED_OUTPATIENT_CLINIC_OR_DEPARTMENT_OTHER): Payer: Self-pay | Admitting: Emergency Medicine

## 2014-11-09 ENCOUNTER — Emergency Department (HOSPITAL_BASED_OUTPATIENT_CLINIC_OR_DEPARTMENT_OTHER): Payer: Medicaid Other

## 2014-11-09 ENCOUNTER — Ambulatory Visit (HOSPITAL_BASED_OUTPATIENT_CLINIC_OR_DEPARTMENT_OTHER): Admission: RE | Admit: 2014-11-09 | Payer: Medicaid Other | Source: Ambulatory Visit

## 2014-11-09 ENCOUNTER — Emergency Department (HOSPITAL_BASED_OUTPATIENT_CLINIC_OR_DEPARTMENT_OTHER)
Admission: EM | Admit: 2014-11-09 | Discharge: 2014-11-09 | Disposition: A | Discharge status: Home / Self Care | Payer: Medicaid Other | Attending: Emergency Medicine | Admitting: Emergency Medicine

## 2014-11-09 DIAGNOSIS — I10 Essential (primary) hypertension: Secondary | ICD-10-CM | POA: Insufficient documentation

## 2014-11-09 DIAGNOSIS — Z7982 Long term (current) use of aspirin: Secondary | ICD-10-CM | POA: Insufficient documentation

## 2014-11-09 DIAGNOSIS — M1990 Unspecified osteoarthritis, unspecified site: Secondary | ICD-10-CM | POA: Insufficient documentation

## 2014-11-09 DIAGNOSIS — Z79899 Other long term (current) drug therapy: Secondary | ICD-10-CM | POA: Diagnosis not present

## 2014-11-09 DIAGNOSIS — N200 Calculus of kidney: Secondary | ICD-10-CM | POA: Diagnosis not present

## 2014-11-09 DIAGNOSIS — Z8673 Personal history of transient ischemic attack (TIA), and cerebral infarction without residual deficits: Secondary | ICD-10-CM | POA: Diagnosis not present

## 2014-11-09 DIAGNOSIS — R509 Fever, unspecified: Secondary | ICD-10-CM | POA: Diagnosis not present

## 2014-11-09 DIAGNOSIS — R7989 Other specified abnormal findings of blood chemistry: Secondary | ICD-10-CM

## 2014-11-09 DIAGNOSIS — R1031 Right lower quadrant pain: Secondary | ICD-10-CM

## 2014-11-09 DIAGNOSIS — G8929 Other chronic pain: Secondary | ICD-10-CM

## 2014-11-09 DIAGNOSIS — Z87891 Personal history of nicotine dependence: Secondary | ICD-10-CM | POA: Diagnosis not present

## 2014-11-09 DIAGNOSIS — R319 Hematuria, unspecified: Secondary | ICD-10-CM | POA: Diagnosis present

## 2014-11-09 DIAGNOSIS — R945 Abnormal results of liver function studies: Secondary | ICD-10-CM | POA: Diagnosis not present

## 2014-11-09 DIAGNOSIS — M549 Dorsalgia, unspecified: Secondary | ICD-10-CM | POA: Insufficient documentation

## 2014-11-09 DIAGNOSIS — Z7951 Long term (current) use of inhaled steroids: Secondary | ICD-10-CM | POA: Insufficient documentation

## 2014-11-09 LAB — BILIRUBIN, DIRECT: BILIRUBIN DIRECT: 3.2 mg/dL — AB (ref 0.1–0.5)

## 2014-11-09 LAB — CBC
HCT: 44.5 % (ref 39.0–52.0)
Hemoglobin: 15.1 g/dL (ref 13.0–17.0)
MCH: 29.3 pg (ref 26.0–34.0)
MCHC: 33.9 g/dL (ref 30.0–36.0)
MCV: 86.4 fL (ref 78.0–100.0)
Platelets: 281 10*3/uL (ref 150–400)
RBC: 5.15 MIL/uL (ref 4.22–5.81)
RDW: 14.4 % (ref 11.5–15.5)
WBC: 13.6 10*3/uL — ABNORMAL HIGH (ref 4.0–10.5)

## 2014-11-09 LAB — URINALYSIS, ROUTINE W REFLEX MICROSCOPIC
Glucose, UA: NEGATIVE mg/dL
Hgb urine dipstick: NEGATIVE
KETONES UR: 15 mg/dL — AB
NITRITE: POSITIVE — AB
PROTEIN: NEGATIVE mg/dL
Specific Gravity, Urine: 1.024 (ref 1.005–1.030)
UROBILINOGEN UA: 4 mg/dL — AB (ref 0.0–1.0)
pH: 8 (ref 5.0–8.0)

## 2014-11-09 LAB — COMPREHENSIVE METABOLIC PANEL
ALT: 69 U/L — ABNORMAL HIGH (ref 17–63)
AST: 69 U/L — ABNORMAL HIGH (ref 15–41)
Albumin: 3.9 g/dL (ref 3.5–5.0)
Alkaline Phosphatase: 118 U/L (ref 38–126)
Anion gap: 9 (ref 5–15)
BUN: 10 mg/dL (ref 6–20)
CALCIUM: 9 mg/dL (ref 8.9–10.3)
CO2: 28 mmol/L (ref 22–32)
Chloride: 102 mmol/L (ref 101–111)
Creatinine, Ser: 0.82 mg/dL (ref 0.61–1.24)
GFR calc Af Amer: >60 mL/min (ref >60)
GFR calc non Af Amer: >60 mL/min (ref >60)
Glucose, Bld: 111 mg/dL — ABNORMAL HIGH (ref 65–99)
POTASSIUM: 4 mmol/L (ref 3.5–5.1)
Sodium: 139 mmol/L (ref 135–145)
Total Bilirubin: 5.2 mg/dL — ABNORMAL HIGH (ref 0.3–1.2)
Total Protein: 7.4 g/dL (ref 6.5–8.1)

## 2014-11-09 LAB — URINE MICROSCOPIC-ADD ON

## 2014-11-09 LAB — ACETAMINOPHEN LEVEL: Acetaminophen (Tylenol), Serum: <10 ug/mL — ABNORMAL LOW (ref 10–30)

## 2014-11-09 MED ORDER — CIPROFLOXACIN HCL 500 MG PO TABS: 500.0000 mg | ORAL_TABLET | Freq: Two times a day (BID) | ORAL | Status: DC

## 2014-11-09 MED ORDER — OXYCODONE HCL 5 MG PO TABS: 5.0000 mg | ORAL_TABLET | ORAL | Status: DC | PRN

## 2014-11-09 MED ORDER — ONDANSETRON 4 MG PO TBDP
4.0000 mg | ORAL_TABLET | Freq: Once | ORAL | Status: AC
  Administered 2014-11-09: 4 mg via ORAL
  Filled 2014-11-09: qty 1

## 2014-11-09 MED ORDER — HYDROMORPHONE HCL 1 MG/ML IJ SOLN
1.0000 mg | Freq: Once | INTRAMUSCULAR | Status: AC
  Administered 2014-11-09: 1 mg via INTRAMUSCULAR
  Filled 2014-11-09: qty 1

## 2014-11-09 NOTE — ED Notes (Signed)
Pt in c/o flank pain, lower abdominal pain, hematuria, and fever of 102 last pm. States heavy hx of kidney stones, counting 27 stones in the past. States noticed sx 4 days ago, was seen as [facility] 3 days ago with confirmation of stone. Here for reevaluation due to increased pain, gross hematuria, and fever.

## 2014-11-09 NOTE — ED Provider Notes (Signed)
CSN: [id number]     Arrival date & time 11/09/14  1504 History  This chart was scribed for Arby Barrette, MD by Abel Presto, ED Scribe. This patient was seen in room MH11/MH11 and the patient's care was started at 4:12 PM.     Chief Complaint  Patient presents with  . Hematuria  . Fever     The history is provided by the patient. No language interpreter was used.   HPI Comments: Scott Holden is a 59 y.o. male with PMHx of HTN and nephrolithiasis who presents to the Emergency Department complaining of hematuria and fever, at highest 102 with onset yesterday. Pt notes associated flank pain with onset 3 days ago and nausea for which he has taken Zofran. Pt states pain radiates into left leg. Pt notes some left leg deficits at baseline. Pt reports h/o 27 renal calculi. Pt was seen in [facility] on 11/06/14 for flank pain with onset that day.  Pt was d/c with hydrocodone and instructions for a urology f/u with return precautions. He states symptoms have worsened since. Pt denies chest pain, SOB, acute leg swelling, vomiting and chills.    Past Medical History  Diagnosis Date  . Kidney calculi   . Hypertension   . History of TIAs   . Arthritis   . Back pain     trouble with lumbar 2, 3, and 4 - degenerative disease per pt  . Unspecified transient cerebral ischemia   . HA (headache)   . Syncope and collapse    Past Surgical History  Procedure Laterality Date  . Total hip arthroplasty    . Joint replacement    . Knee surgery    . Hernia repair  2003   Family History  Problem Relation Age of Onset  . Cancer Maternal Grandfather     prostate  . Diabetes Mother   . Heart Problems Father    History  Substance Use Topics  . Smoking status: Former Smoker -- 0.50 packs/day for 20 years    Types: Cigarettes    Quit date: 10/24/2012  . Smokeless tobacco: Never Used     Comment: three cigarettes daily  . Alcohol Use: No    Review of Systems  Constitutional: Positive for  fever.  Genitourinary: Positive for hematuria.  10 Systems reviewed and all are negative for acute change except as noted in the HPI.      Allergies  Review of patient's allergies indicates no known allergies.  Home Medications   Prior to Admission medications   Medication Sig Start Date End Date Taking? Authorizing Provider  albuterol (PROVENTIL HFA;VENTOLIN HFA) 108 (90 BASE) MCG/ACT inhaler Inhale 1-2 puffs into the lungs every 6 (six) hours as needed for wheezing. 11/27/12   Derwood Kaplan, MD  ciprofloxacin (CIPRO) 500 MG tablet Take 1 tablet (500 mg total) by mouth 2 (two) times daily. 11/09/14   Arby Barrette, MD  diltiazem (CARDIZEM CD) 360 MG 24 hr capsule Take 360 mg by mouth every morning. 09/05/14   Historical Provider, MD  diltiazem (DILACOR XR) 240 MG 24 hr capsule Take 240 mg by mouth every morning.     Historical Provider, MD  dipyridamole-aspirin (AGGRENOX) 200-25 MG per 12 hr capsule Take 1 capsule by mouth 2 (two) times daily. 01/15/14   Nilda Riggs, NP  fentaNYL (DURAGESIC - DOSED MCG/HR) 25 MCG/HR patch Place 25 mcg onto the skin every 3 (three) days.    Historical Provider, MD  fluticasone (FLONASE) 50  MCG/ACT nasal spray Place 2 sprays into the nose daily. Patient not taking: Reported on 11/11/2014 04/04/13   Huston Foley, MD  hydrochlorothiazide (HYDRODIURIL) 25 MG tablet Take 25 mg by mouth every morning.    Historical Provider, MD  HYDROcodone-acetaminophen (NORCO/VICODIN) 5-325 MG per tablet Take 1 tablet by mouth every 6 (six) hours as needed. 11/06/14   Derwood Kaplan, MD  ipratropium-albuterol (DUONEB) 0.5-2.5 (3) MG/3ML SOLN Take 3 mLs by nebulization every 4 (four) hours as needed (For shortness of breath). Patient not taking: Reported on 11/11/2014 01/15/13   Laveda Norman, MD  lamoTRIgine (LAMICTAL) 25 MG tablet TAKE 3 TABLETS BY MOUTH TWICE A DAY FOR 1 WEEK THEN TAKE 4 TABLETS BY MOUTH TWICE A DAY Patient taking differently: Take 3 tablets by mouth twice  daily for 1 week then take 4 tablets by mouth twice a day. 03/17/14   Nilda Riggs, NP  loratadine (CLARITIN) 10 MG tablet Take 10 mg by mouth every morning.    Historical Provider, MD  losartan (COZAAR) 50 MG tablet Take 50 mg by mouth every morning.    Historical Provider, MD  metFORMIN (GLUCOPHAGE) 500 MG tablet Take 1 tablet by mouth 2 (two) times daily. 10/22/14   Historical Provider, MD  omeprazole (PRILOSEC) 40 MG capsule Take 40 mg by mouth every morning.     Historical Provider, MD  ondansetron (ZOFRAN ODT) 8 MG disintegrating tablet Take 1 tablet (8 mg total) by mouth every 8 (eight) hours as needed for nausea. 11/06/14   Derwood Kaplan, MD  oxyCODONE (ROXICODONE) 5 MG immediate release tablet Take 1 tablet (5 mg total) by mouth every 6 (six) hours as needed for severe pain. 11/11/14   Roxy Horseman, PA-C  oxyCODONE-acetaminophen (PERCOCET) 10-325 MG per tablet Take 1 tablet by mouth every 4 (four) hours as needed for pain.    Historical Provider, MD  tamsulosin (FLOMAX) 0.4 MG CAPS capsule Take 1 capsule (0.4 mg total) by mouth daily. Patient not taking: Reported on 11/11/2014 11/06/14   Derwood Kaplan, MD   BP 130/68 mmHg  Pulse 89  Temp(Src) 99.5 F (37.5 C) (Oral)  Resp 16  Ht 5\' 11"  (1.803 m)  Wt 298 lb (135.172 kg)  BMI 41.58 kg/m2  SpO2 100% Physical Exam  Constitutional: He is oriented to person, place, and time.  The patient is morbidly obese. He is nontoxic and alert. No respiratory distress.  HENT:  Head: Normocephalic and atraumatic.  Eyes: Conjunctivae and EOM are normal. Pupils are equal, round, and reactive to light.  Neck: No thyromegaly present.  Cardiovascular: Normal rate, regular rhythm, normal heart sounds and intact distal pulses.   Pulmonary/Chest: Effort normal and breath sounds normal. No respiratory distress.  Abdominal: Soft. Bowel sounds are normal.  Abdomen is morbidly obese. He does not endorse right upper quadrant tenderness to deep  palpation. He endorses some right lower quadrant tenderness without guarding or rebound. No right CVA tenderness. Abdomen is soft. No peritoneal signs.  Musculoskeletal: Normal range of motion.  Trace edema bilateral lower extremities. No calf tenderness. Movements are coordinated and symmetric.   Neurological: He is alert and oriented to person, place, and time. No cranial nerve deficit. He exhibits normal muscle tone. Coordination normal.  Skin: Skin is warm and dry.  Psychiatric: He has a normal mood and affect.    ED Course  Procedures (including critical care time) DIAGNOSTIC STUDIES: Oxygen Saturation is 99% on room air, normal by my interpretation.    COORDINATION OF CARE:  4:17 PM Discussed treatment plan with patient at beside, the patient agrees with the plan and has no further questions at this time.   Labs Review Labs Reviewed  CBC - Abnormal; Notable for the following:    WBC 13.6 (*)    All other components within normal limits  COMPREHENSIVE METABOLIC PANEL - Abnormal; Notable for the following:    Glucose, Bld 111 (*)    AST 69 (*)    ALT 69 (*)    Total Bilirubin 5.2 (*)    All other components within normal limits  URINALYSIS, ROUTINE W REFLEX MICROSCOPIC - Abnormal; Notable for the following:    Color, Urine ORANGE (*)    Bilirubin Urine LARGE (*)    Ketones, ur 15 (*)    Urobilinogen, UA 4.0 (*)    Nitrite POSITIVE (*)    Leukocytes, UA SMALL (*)    All other components within normal limits  URINE MICROSCOPIC-ADD ON - Abnormal; Notable for the following:    Bacteria, UA FEW (*)    All other components within normal limits  ACETAMINOPHEN LEVEL - Abnormal; Notable for the following:    Acetaminophen (Tylenol), Serum <10 (*)    All other components within normal limits  BILIRUBIN, DIRECT - Abnormal; Notable for the following:    Bilirubin, Direct 3.2 (*)    All other components within normal limits  URINE CULTURE  HEPATITIS PANEL, ACUTE    Imaging  Review No results found.   EKG Interpretation None      MDM   Final diagnoses:  Right lower quadrant abdominal pain  LFTs abnormal  Chronic back pain  Recurrent kidney stones    The patient presents with right lower abdominal pain which he perceives as being a kidney stone. He states he has some old time and usually he passes them at home. This however has been more persistent. CT shows kidney stones but no ureteral stone or acutely obstructing stone. Incidentally the patient has a significant elevated total bilirubin. I contacted gastroenterology to discuss this as there was no evident etiology and the patient did not have hepatic pain or symptoms to suggest that his presentation today is in relationship to elevated bilirubin. There is no evidence of hemolysis or other causes or symptoms associated with hepatic failure. CT does not show any acute intra-abdominal findings and his examination is nonsurgical. At this point the patient will follow up with gastroenterology for further evaluation regarding the elevated total bili. He is also advised to follow-up with his family physician.    Arby Barrette, MD 11/15/14 [phone]

## 2014-11-09 NOTE — Discharge Instructions (Signed)
You have abnormal function of your liver. You will require further evaluation. Contact the physician listed above for recheck this week. Return to the emergency department or developing pain in your right upper abdomen or back, vomiting or other concerning symptoms.  Kidney Stones Kidney stones (urolithiasis) are deposits that form inside your kidneys. The intense pain is caused by the stone moving through the urinary tract. When the stone moves, the ureter goes into spasm around the stone. The stone is usually passed in the urine.  CAUSES   A disorder that makes certain neck glands produce too much parathyroid hormone (primary hyperparathyroidism).  A buildup of uric acid crystals, similar to gout in your joints.  Narrowing (stricture) of the ureter.  A kidney obstruction present at birth (congenital obstruction).  Previous surgery on the kidney or ureters.  Numerous kidney infections. SYMPTOMS   Feeling sick to your stomach (nauseous).  Throwing up (vomiting).  Blood in the urine (hematuria).  Pain that usually spreads (radiates) to the groin.  Frequency or urgency of urination. DIAGNOSIS   Taking a history and physical exam.  Blood or urine tests.  CT scan.  Occasionally, an examination of the inside of the urinary bladder (cystoscopy) is performed. TREATMENT   Observation.  Increasing your fluid intake.  Extracorporeal shock wave lithotripsy--This is a noninvasive procedure that uses shock waves to break up kidney stones.  Surgery may be needed if you have severe pain or persistent obstruction. There are various surgical procedures. Most of the procedures are performed with the use of small instruments. Only small incisions are needed to accommodate these instruments, so recovery time is minimized. The size, location, and chemical composition are all important variables that will determine the proper choice of action for you. Talk to your health care provider to  better understand your situation so that you will minimize the risk of injury to yourself and your kidney.  HOME CARE INSTRUCTIONS   Drink enough water and fluids to keep your urine clear or pale yellow. This will help you to pass the stone or stone fragments.  Strain all urine through the provided strainer. Keep all particulate matter and stones for your health care provider to see. The stone causing the pain may be as small as a grain of salt. It is very important to use the strainer each and every time you pass your urine. The collection of your stone will allow your health care provider to analyze it and verify that a stone has actually passed. The stone analysis will often identify what you can do to reduce the incidence of recurrences.  Only take over-the-counter or prescription medicines for pain, discomfort, or fever as directed by your health care provider.  Make a follow-up appointment with your health care provider as directed.  Get follow-up X-rays if required. The absence of pain does not always mean that the stone has passed. It may have only stopped moving. If the urine remains completely obstructed, it can cause loss of kidney function or even complete destruction of the kidney. It is your responsibility to make sure X-rays and follow-ups are completed. Ultrasounds of the kidney can show blockages and the status of the kidney. Ultrasounds are not associated with any radiation and can be performed easily in a matter of minutes. SEEK MEDICAL CARE IF:  You experience pain that is progressive and unresponsive to any pain medicine you have been prescribed. SEEK IMMEDIATE MEDICAL CARE IF:   Pain cannot be controlled with the prescribed medicine.  You have a fever or shaking chills.  The severity or intensity of pain increases over 18 hours and is not relieved by pain medicine.  You develop a new onset of abdominal pain.  You feel faint or pass out.  You are unable to  urinate. MAKE SURE YOU:   Understand these instructions.  Will watch your condition.  Will get help right away if you are not doing well or get worse. Document Released: 06/12/2005 Document Revised: 02/12/2013 Document Reviewed: 11/13/2012 [facility] Patient Information 2015 [place], [place]. This information is not intended to replace advice given to you by your health care provider. Make sure you discuss any questions you have with your health care provider.

## 2014-11-10 LAB — URINE CULTURE
COLONY COUNT: NO GROWTH
CULTURE: NO GROWTH

## 2014-11-10 LAB — HEPATITIS PANEL, ACUTE
HCV Ab: NEGATIVE
HEP B S AG: NEGATIVE
Hep A IgM: NONREACTIVE
Hep B C IgM: NONREACTIVE

## 2014-11-11 ENCOUNTER — Emergency Department (HOSPITAL_COMMUNITY)
Admission: EM | Admit: 2014-11-11 | Discharge: 2014-11-11 | Disposition: A | Discharge status: Home / Self Care | Payer: Medicaid Other | Attending: Emergency Medicine | Admitting: Emergency Medicine

## 2014-11-11 ENCOUNTER — Encounter (HOSPITAL_COMMUNITY): Payer: Self-pay | Admitting: Family Medicine

## 2014-11-11 ENCOUNTER — Emergency Department (HOSPITAL_COMMUNITY): Payer: Medicaid Other

## 2014-11-11 DIAGNOSIS — M1990 Unspecified osteoarthritis, unspecified site: Secondary | ICD-10-CM | POA: Insufficient documentation

## 2014-11-11 DIAGNOSIS — Z9889 Other specified postprocedural states: Secondary | ICD-10-CM | POA: Diagnosis not present

## 2014-11-11 DIAGNOSIS — Z7952 Long term (current) use of systemic steroids: Secondary | ICD-10-CM | POA: Diagnosis not present

## 2014-11-11 DIAGNOSIS — Z87891 Personal history of nicotine dependence: Secondary | ICD-10-CM | POA: Insufficient documentation

## 2014-11-11 DIAGNOSIS — Z8673 Personal history of transient ischemic attack (TIA), and cerebral infarction without residual deficits: Secondary | ICD-10-CM | POA: Insufficient documentation

## 2014-11-11 DIAGNOSIS — Z87442 Personal history of urinary calculi: Secondary | ICD-10-CM | POA: Insufficient documentation

## 2014-11-11 DIAGNOSIS — R1011 Right upper quadrant pain: Secondary | ICD-10-CM | POA: Diagnosis present

## 2014-11-11 DIAGNOSIS — Z79899 Other long term (current) drug therapy: Secondary | ICD-10-CM | POA: Diagnosis not present

## 2014-11-11 DIAGNOSIS — R10A0 Flank pain, unspecified side: Secondary | ICD-10-CM

## 2014-11-11 DIAGNOSIS — I10 Essential (primary) hypertension: Secondary | ICD-10-CM | POA: Diagnosis not present

## 2014-11-11 DIAGNOSIS — R319 Hematuria, unspecified: Secondary | ICD-10-CM | POA: Diagnosis not present

## 2014-11-11 DIAGNOSIS — Z792 Long term (current) use of antibiotics: Secondary | ICD-10-CM | POA: Diagnosis not present

## 2014-11-11 DIAGNOSIS — R109 Unspecified abdominal pain: Secondary | ICD-10-CM

## 2014-11-11 LAB — CBC WITH DIFFERENTIAL/PLATELET
BASOS ABS: 0 10*3/uL (ref 0.0–0.1)
Basophils Relative: 1 % (ref 0–1)
EOS ABS: 0.3 10*3/uL (ref 0.0–0.7)
Eosinophils Relative: 3 % (ref 0–5)
HCT: 42.9 % (ref 39.0–52.0)
Hemoglobin: 14.1 g/dL (ref 13.0–17.0)
Lymphocytes Relative: 22 % (ref 12–46)
Lymphs Abs: 1.8 10*3/uL (ref 0.7–4.0)
MCH: 28.7 pg (ref 26.0–34.0)
MCHC: 32.9 g/dL (ref 30.0–36.0)
MCV: 87.2 fL (ref 78.0–100.0)
Monocytes Absolute: 0.6 10*3/uL (ref 0.1–1.0)
Monocytes Relative: 7 % (ref 3–12)
NEUTROS ABS: 5.8 10*3/uL (ref 1.7–7.7)
Neutrophils Relative %: 67 % (ref 43–77)
PLATELETS: 269 10*3/uL (ref 150–400)
RBC: 4.92 MIL/uL (ref 4.22–5.81)
RDW: 14.9 % (ref 11.5–15.5)
WBC: 8.5 10*3/uL (ref 4.0–10.5)

## 2014-11-11 LAB — URINE MICROSCOPIC-ADD ON

## 2014-11-11 LAB — COMPREHENSIVE METABOLIC PANEL
ALT: 43 U/L (ref 17–63)
ANION GAP: 7 (ref 5–15)
AST: 33 U/L (ref 15–41)
Albumin: 3.2 g/dL — ABNORMAL LOW (ref 3.5–5.0)
Alkaline Phosphatase: 118 U/L (ref 38–126)
BUN: 10 mg/dL (ref 6–20)
CO2: 25 mmol/L (ref 22–32)
Calcium: 8.7 mg/dL — ABNORMAL LOW (ref 8.9–10.3)
Chloride: 106 mmol/L (ref 101–111)
Creatinine, Ser: 1.01 mg/dL (ref 0.61–1.24)
GFR calc Af Amer: >60 mL/min (ref >60)
GFR calc non Af Amer: >60 mL/min (ref >60)
Glucose, Bld: 120 mg/dL — ABNORMAL HIGH (ref 65–99)
POTASSIUM: 3.8 mmol/L (ref 3.5–5.1)
Sodium: 138 mmol/L (ref 135–145)
Total Bilirubin: 0.9 mg/dL (ref 0.3–1.2)
Total Protein: 6.4 g/dL — ABNORMAL LOW (ref 6.5–8.1)

## 2014-11-11 LAB — URINALYSIS, ROUTINE W REFLEX MICROSCOPIC
GLUCOSE, UA: NEGATIVE mg/dL
Ketones, ur: 15 mg/dL — AB
Nitrite: NEGATIVE
PH: 5 (ref 5.0–8.0)
PROTEIN: NEGATIVE mg/dL
Specific Gravity, Urine: 1.025 (ref 1.005–1.030)
Urobilinogen, UA: 1 mg/dL (ref 0.0–1.0)

## 2014-11-11 LAB — LIPASE, BLOOD: Lipase: 22 U/L (ref 22–51)

## 2014-11-11 MED ORDER — HYDROMORPHONE HCL 1 MG/ML IJ SOLN
1.0000 mg | Freq: Once | INTRAMUSCULAR | Status: AC
Start: 2014-11-11 — End: 2014-11-11
  Administered 2014-11-11: 1 mg via INTRAVENOUS
  Filled 2014-11-11: qty 1

## 2014-11-11 MED ORDER — OXYCODONE HCL 5 MG PO TABS: 5.0000 mg | ORAL_TABLET | Freq: Four times a day (QID) | ORAL | Status: DC | PRN

## 2014-11-11 MED ORDER — ONDANSETRON HCL 4 MG/2ML IJ SOLN
4.0000 mg | Freq: Once | INTRAMUSCULAR | Status: AC
  Administered 2014-11-11: 4 mg via INTRAVENOUS
  Filled 2014-11-11: qty 2

## 2014-11-11 MED ORDER — HYDROMORPHONE HCL 1 MG/ML IJ SOLN
1.0000 mg | Freq: Once | INTRAMUSCULAR | Status: AC
  Administered 2014-11-11: 1 mg via INTRAVENOUS
  Filled 2014-11-11: qty 1

## 2014-11-11 NOTE — Discharge Instructions (Signed)

## 2014-11-11 NOTE — ED Notes (Addendum)
Pt here for RLQ pain, right flank pain and hematuria. sts a lot of blood. sts recently seen at [facility]. sts he cant get a full breath and feels bloated.

## 2014-11-11 NOTE — ED Provider Notes (Signed)
CSN: [id number]     Arrival date & time 11/11/14  1054 History   First MD Initiated Contact with Patient 11/11/14 1130     Chief Complaint  Patient presents with  . Abdominal Pain  . Flank Pain  . Hematuria     (Consider location/radiation/quality/duration/timing/severity/associated sxs/prior Treatment) HPI Comments: Patient presents emergency department with chief complaint of right flank pain. Recently diagnosed with kidney stone. Reports still having some hematuria. States that he has seen a lot of blood in his urine. Additionally, states that he has increased right upper quadrant pain. He feels bloated and that he felt short of breath last night. When he was seen approximately 3 days ago, his bilirubin was elevated to 5, and he was instructed to follow-up with gastroenterology. Patient has an appointment with gastroenterology today, but decided to come to the emergency department instead. He reports running a fever up to 102. Symptoms are aggravated with palpation. Nothing makes his symptoms better. He denies nausea, vomiting, or diarrhea.  The history is provided by the patient. No language interpreter was used.    Past Medical History  Diagnosis Date  . Kidney calculi   . Hypertension   . History of TIAs   . Arthritis   . Back pain     trouble with lumbar 2, 3, and 4 - degenerative disease per pt  . Unspecified transient cerebral ischemia   . HA (headache)   . Syncope and collapse    Past Surgical History  Procedure Laterality Date  . Total hip arthroplasty    . Joint replacement    . Knee surgery    . Hernia repair  2003   Family History  Problem Relation Age of Onset  . Cancer Maternal Grandfather     prostate  . Diabetes Mother   . Heart Problems Father    History  Substance Use Topics  . Smoking status: Former Smoker -- 0.50 packs/day for 20 years    Types: Cigarettes    Quit date: 10/24/2012  . Smokeless tobacco: Never Used     Comment: three cigarettes  daily  . Alcohol Use: No    Review of Systems  Gastrointestinal: Positive for abdominal pain.  Genitourinary: Positive for hematuria and flank pain.  All other systems reviewed and are negative.     Allergies  Review of patient's allergies indicates no known allergies.  Home Medications   Prior to Admission medications   Medication Sig Start Date End Date Taking? Authorizing Provider  albuterol (PROVENTIL HFA;VENTOLIN HFA) 108 (90 BASE) MCG/ACT inhaler Inhale 1-2 puffs into the lungs every 6 (six) hours as needed for wheezing. 11/27/12   Derwood Kaplan, MD  ciprofloxacin (CIPRO) 500 MG tablet Take 1 tablet (500 mg total) by mouth 2 (two) times daily. 11/09/14   Arby Barrette, MD  diltiazem (CARDIZEM CD) 360 MG 24 hr capsule Take 360 mg by mouth every morning. 09/05/14   Historical Provider, MD  diltiazem (DILACOR XR) 240 MG 24 hr capsule Take 240 mg by mouth every morning.     Historical Provider, MD  dipyridamole-aspirin (AGGRENOX) 200-25 MG per 12 hr capsule Take 1 capsule by mouth 2 (two) times daily. 01/15/14   Nilda Riggs, NP  fentaNYL (DURAGESIC - DOSED MCG/HR) 25 MCG/HR patch Place 25 mcg onto the skin every 3 (three) days.    Historical Provider, MD  fluticasone (FLONASE) 50 MCG/ACT nasal spray Place 2 sprays into the nose daily. 04/04/13   Huston Foley, MD  hydrochlorothiazide (HYDRODIURIL)  25 MG tablet Take 25 mg by mouth every morning.    Historical Provider, MD  HYDROcodone-acetaminophen (NORCO/VICODIN) 5-325 MG per tablet Take 1 tablet by mouth every 6 (six) hours as needed. 11/06/14   Derwood Kaplan, MD  ipratropium-albuterol (DUONEB) 0.5-2.5 (3) MG/3ML SOLN Take 3 mLs by nebulization every 4 (four) hours as needed (For shortness of breath). 01/15/13   Laveda Norman, MD  lamoTRIgine (LAMICTAL) 25 MG tablet TAKE 3 TABLETS BY MOUTH TWICE A DAY FOR 1 WEEK THEN TAKE 4 TABLETS BY MOUTH TWICE A DAY Patient taking differently: Take 3 tablets by mouth twice daily for 1 week then  take 4 tablets by mouth twice a day. 03/17/14   Nilda Riggs, NP  loratadine (CLARITIN) 10 MG tablet Take 10 mg by mouth every morning.    Historical Provider, MD  losartan (COZAAR) 50 MG tablet Take 50 mg by mouth every morning.    Historical Provider, MD  metFORMIN (GLUCOPHAGE) 500 MG tablet Take 1 tablet by mouth 2 (two) times daily. 10/22/14   Historical Provider, MD  omeprazole (PRILOSEC) 40 MG capsule Take 40 mg by mouth every morning.     Historical Provider, MD  ondansetron (ZOFRAN ODT) 8 MG disintegrating tablet Take 1 tablet (8 mg total) by mouth every 8 (eight) hours as needed for nausea. 11/06/14   Derwood Kaplan, MD  oxyCODONE (ROXICODONE) 5 MG immediate release tablet Take 1 tablet (5 mg total) by mouth every 4 (four) hours as needed for severe pain. 11/09/14   Arby Barrette, MD  oxyCODONE-acetaminophen (PERCOCET) 10-325 MG per tablet Take 1 tablet by mouth every 4 (four) hours as needed for pain.    Historical Provider, MD  tamsulosin (FLOMAX) 0.4 MG CAPS capsule Take 1 capsule (0.4 mg total) by mouth daily. 11/06/14   Ankit Nanavati, MD   BP 148/87 mmHg  Pulse 90  Temp(Src) 98.6 F (37 C) (Oral)  Resp 20  Ht  (1.778 m)  Wt 295 lb (133.811 kg)  BMI 42.33 kg/m2  SpO2 95% Physical Exam  Constitutional: He is oriented to person, place, and time. He appears well-developed and well-nourished.  HENT:  Head: Normocephalic and atraumatic.  Eyes: Conjunctivae and EOM are normal. Pupils are equal, round, and reactive to light. Right eye exhibits no discharge. Left eye exhibits no discharge. No scleral icterus.  Neck: Normal range of motion. Neck supple. No JVD present.  Cardiovascular: Normal rate, regular rhythm and normal heart sounds.  Exam reveals no gallop and no friction rub.   No murmur heard. Pulmonary/Chest: Effort normal and breath sounds normal. No respiratory distress. He has no wheezes. He has no rales. He exhibits no tenderness.  Abdominal: Soft. He exhibits  no distension and no mass. There is tenderness. There is no rebound and no guarding.  RUQ ttp, no other focal abdominal tenderness  Musculoskeletal: Normal range of motion. He exhibits no edema or tenderness.  Neurological: He is alert and oriented to person, place, and time.  Skin: Skin is warm and dry.  Psychiatric: He has a normal mood and affect. His behavior is normal. Judgment and thought content normal.  Nursing note and vitals reviewed.   ED Course  Procedures (including critical care time) Labs Review Labs Reviewed  CBC WITH DIFFERENTIAL/PLATELET  COMPREHENSIVE METABOLIC PANEL  LIPASE, BLOOD  URINALYSIS, ROUTINE W REFLEX MICROSCOPIC    Imaging Review Ct Renal Stone Study  11/09/2014   CLINICAL DATA:  59 year old male with right-sided flank pain and hematuria  EXAM:  CT ABDOMEN AND PELVIS WITHOUT CONTRAST  TECHNIQUE: Multidetector CT imaging of the abdomen and pelvis was performed following the standard protocol without IV contrast.  COMPARISON:  Prior CT abdomen/ pelvis 12/09/2012  FINDINGS: Lower Chest: The lung bases are clear. Visualized cardiac structures are within normal limits for size. No pericardial effusion. Unremarkable visualized distal thoracic esophagus.  Abdomen: Unenhanced CT was performed per clinician order. Lack of IV contrast limits sensitivity and specificity, especially for evaluation of abdominal/pelvic solid viscera. Within these limitations, unremarkable CT appearance of the stomach, duodenum, spleen, adrenal glands and pancreas save for fatty atrophy. Normal hepatic contour and morphology. No discrete hepatic lesion. Gallbladder is unremarkable. No intra or extrahepatic biliary ductal dilatation.  The bilateral nephrolithiasis. 5 mm stone noted in the interpolar collecting system on the right. Two punctate stones are present of the left kidney, 1 in the upper and 1 in the interpolar collecting system. Nonspecific perinephric stranding bilaterally. No  hydronephrosis.  No evidence of obstruction or focal bowel wall thickening. Normal appendix in the right lower quadrant. The terminal ileum is unremarkable. No free fluid or suspicious adenopathy.  Pelvis: Although visualization is limited secondary to streak artifact from the right hip prosthesis, there is a punctate calcification in the region of the right UVJ. The bladder is otherwise decompressed. Dystrophic calcifications present centrally within the prostate gland.  Bones/Soft Tissues: Right hip arthroplasty without evidence of hardware complication in the visualized segment. No acute fracture or aggressive appearing lytic or blastic osseous lesion. Lower lumbar facet arthropathy.  Vascular: Limited evaluation in the absence of intravenous contrast. Atherosclerotic vascular disease without significant stenosis or aneurysmal dilatation.  IMPRESSION: 1. Small 1-2 mm stone in the right aspect of the bladder may be within the ureterovesicular junction, or recently passed into the bladder. There is no evidence of associated right hydro ureteral nephrosis. 2. Additional nonobstructing renal stones bilaterally. 3. Nonspecific bilateral perinephric stranding which is similar compared to prior imaging. Atherosclerotic vascular calcifications.   Electronically Signed   By: Malachy Moan M.D.   On: 11/09/2014 17:02     EKG Interpretation None      MDM   Final diagnoses:  RUQ pain  Flank pain  Hematuria  Abdominal pain    Patient with known kidney stones, presents with persistent right flank pain. States that he thinks that he passed the stone, but is still having pain. Will check labs, an ultrasound.  Ultrasound remarkable for unresolved small right kidney stone. Will give additional pain medicine, this patient has used all of his. He has follow-up on Friday. Other labs have improved from his previous visit. He is stable and well-appearing. Will discharge to home.    Roxy Horseman,  PA-C 11/11/14 1532  Blake Divine, MD 11/12/14 [id number]

## 2014-12-02 ENCOUNTER — Encounter (HOSPITAL_COMMUNITY): Payer: Self-pay | Admitting: *Deleted

## 2014-12-02 ENCOUNTER — Inpatient Hospital Stay (HOSPITAL_COMMUNITY)
Admission: EM | Admit: 2014-12-02 | Discharge: 2014-12-05 | DRG: 872 | Disposition: A | Discharge status: Home Health | Payer: Medicaid Other | Attending: Family Medicine | Admitting: Family Medicine

## 2014-12-02 ENCOUNTER — Emergency Department (HOSPITAL_COMMUNITY): Payer: Medicaid Other

## 2014-12-02 ENCOUNTER — Other Ambulatory Visit (HOSPITAL_COMMUNITY): Payer: Self-pay

## 2014-12-02 DIAGNOSIS — K529 Noninfective gastroenteritis and colitis, unspecified: Secondary | ICD-10-CM | POA: Diagnosis present

## 2014-12-02 DIAGNOSIS — M1990 Unspecified osteoarthritis, unspecified site: Secondary | ICD-10-CM | POA: Diagnosis present

## 2014-12-02 DIAGNOSIS — F17210 Nicotine dependence, cigarettes, uncomplicated: Secondary | ICD-10-CM | POA: Diagnosis present

## 2014-12-02 DIAGNOSIS — R569 Unspecified convulsions: Secondary | ICD-10-CM

## 2014-12-02 DIAGNOSIS — G40909 Epilepsy, unspecified, not intractable, without status epilepticus: Secondary | ICD-10-CM | POA: Diagnosis present

## 2014-12-02 DIAGNOSIS — Z6841 Body Mass Index (BMI) 40.0 and over, adult: Secondary | ICD-10-CM

## 2014-12-02 DIAGNOSIS — T502X5A Adverse effect of carbonic-anhydrase inhibitors, benzothiadiazides and other diuretics, initial encounter: Secondary | ICD-10-CM | POA: Diagnosis present

## 2014-12-02 DIAGNOSIS — E119 Type 2 diabetes mellitus without complications: Secondary | ICD-10-CM

## 2014-12-02 DIAGNOSIS — K2100 Gastro-esophageal reflux disease with esophagitis, without bleeding: Secondary | ICD-10-CM

## 2014-12-02 DIAGNOSIS — I4510 Unspecified right bundle-branch block: Secondary | ICD-10-CM | POA: Diagnosis present

## 2014-12-02 DIAGNOSIS — E440 Moderate protein-calorie malnutrition: Secondary | ICD-10-CM | POA: Diagnosis present

## 2014-12-02 DIAGNOSIS — R1031 Right lower quadrant pain: Secondary | ICD-10-CM | POA: Diagnosis not present

## 2014-12-02 DIAGNOSIS — D860 Sarcoidosis of lung: Secondary | ICD-10-CM | POA: Diagnosis present

## 2014-12-02 DIAGNOSIS — Z7902 Long term (current) use of antithrombotics/antiplatelets: Secondary | ICD-10-CM | POA: Diagnosis not present

## 2014-12-02 DIAGNOSIS — Z79891 Long term (current) use of opiate analgesic: Secondary | ICD-10-CM | POA: Diagnosis not present

## 2014-12-02 DIAGNOSIS — J42 Unspecified chronic bronchitis: Secondary | ICD-10-CM | POA: Diagnosis present

## 2014-12-02 DIAGNOSIS — N390 Urinary tract infection, site not specified: Secondary | ICD-10-CM | POA: Diagnosis present

## 2014-12-02 DIAGNOSIS — M5136 Other intervertebral disc degeneration, lumbar region: Secondary | ICD-10-CM | POA: Diagnosis present

## 2014-12-02 DIAGNOSIS — G4733 Obstructive sleep apnea (adult) (pediatric): Secondary | ICD-10-CM | POA: Diagnosis present

## 2014-12-02 DIAGNOSIS — A419 Sepsis, unspecified organism: Secondary | ICD-10-CM | POA: Diagnosis present

## 2014-12-02 DIAGNOSIS — I10 Essential (primary) hypertension: Secondary | ICD-10-CM | POA: Diagnosis present

## 2014-12-02 DIAGNOSIS — M5137 Other intervertebral disc degeneration, lumbosacral region: Secondary | ICD-10-CM | POA: Diagnosis present

## 2014-12-02 DIAGNOSIS — I452 Bifascicular block: Secondary | ICD-10-CM | POA: Diagnosis present

## 2014-12-02 DIAGNOSIS — G459 Transient cerebral ischemic attack, unspecified: Secondary | ICD-10-CM

## 2014-12-02 DIAGNOSIS — Z79899 Other long term (current) drug therapy: Secondary | ICD-10-CM | POA: Diagnosis not present

## 2014-12-02 DIAGNOSIS — R51 Headache: Secondary | ICD-10-CM

## 2014-12-02 DIAGNOSIS — N200 Calculus of kidney: Secondary | ICD-10-CM

## 2014-12-02 DIAGNOSIS — J9610 Chronic respiratory failure, unspecified whether with hypoxia or hypercapnia: Secondary | ICD-10-CM | POA: Diagnosis present

## 2014-12-02 DIAGNOSIS — K219 Gastro-esophageal reflux disease without esophagitis: Secondary | ICD-10-CM | POA: Diagnosis present

## 2014-12-02 DIAGNOSIS — I6782 Cerebral ischemia: Secondary | ICD-10-CM | POA: Diagnosis present

## 2014-12-02 DIAGNOSIS — R0602 Shortness of breath: Secondary | ICD-10-CM

## 2014-12-02 DIAGNOSIS — R109 Unspecified abdominal pain: Secondary | ICD-10-CM

## 2014-12-02 DIAGNOSIS — R197 Diarrhea, unspecified: Secondary | ICD-10-CM

## 2014-12-02 DIAGNOSIS — Z8042 Family history of malignant neoplasm of prostate: Secondary | ICD-10-CM

## 2014-12-02 DIAGNOSIS — R531 Weakness: Secondary | ICD-10-CM

## 2014-12-02 DIAGNOSIS — N179 Acute kidney failure, unspecified: Secondary | ICD-10-CM | POA: Diagnosis present

## 2014-12-02 DIAGNOSIS — Z720 Tobacco use: Secondary | ICD-10-CM

## 2014-12-02 DIAGNOSIS — B349 Viral infection, unspecified: Secondary | ICD-10-CM | POA: Diagnosis present

## 2014-12-02 DIAGNOSIS — M51379 Other intervertebral disc degeneration, lumbosacral region without mention of lumbar back pain or lower extremity pain: Secondary | ICD-10-CM | POA: Diagnosis present

## 2014-12-02 DIAGNOSIS — R519 Headache, unspecified: Secondary | ICD-10-CM | POA: Diagnosis present

## 2014-12-02 DIAGNOSIS — J309 Allergic rhinitis, unspecified: Secondary | ICD-10-CM | POA: Diagnosis present

## 2014-12-02 DIAGNOSIS — D72829 Elevated white blood cell count, unspecified: Secondary | ICD-10-CM | POA: Diagnosis present

## 2014-12-02 DIAGNOSIS — N400 Enlarged prostate without lower urinary tract symptoms: Secondary | ICD-10-CM | POA: Diagnosis present

## 2014-12-02 DIAGNOSIS — Z833 Family history of diabetes mellitus: Secondary | ICD-10-CM | POA: Diagnosis not present

## 2014-12-02 DIAGNOSIS — Z96641 Presence of right artificial hip joint: Secondary | ICD-10-CM | POA: Diagnosis present

## 2014-12-02 DIAGNOSIS — Z87442 Personal history of urinary calculi: Secondary | ICD-10-CM | POA: Diagnosis not present

## 2014-12-02 DIAGNOSIS — E860 Dehydration: Secondary | ICD-10-CM | POA: Diagnosis present

## 2014-12-02 DIAGNOSIS — R42 Dizziness and giddiness: Secondary | ICD-10-CM | POA: Diagnosis not present

## 2014-12-02 DIAGNOSIS — Z8673 Personal history of transient ischemic attack (TIA), and cerebral infarction without residual deficits: Secondary | ICD-10-CM

## 2014-12-02 DIAGNOSIS — R300 Dysuria: Secondary | ICD-10-CM | POA: Diagnosis present

## 2014-12-02 HISTORY — DX: Tobacco use: Z72.0

## 2014-12-02 HISTORY — DX: Sarcoidosis of lung: D86.0

## 2014-12-02 HISTORY — DX: Gastro-esophageal reflux disease without esophagitis: K21.9

## 2014-12-02 HISTORY — DX: Type 2 diabetes mellitus without complications: E11.9

## 2014-12-02 HISTORY — DX: Unspecified convulsions: R56.9

## 2014-12-02 HISTORY — DX: Benign prostatic hyperplasia without lower urinary tract symptoms: N40.0

## 2014-12-02 LAB — COMPREHENSIVE METABOLIC PANEL
ALBUMIN: 3.9 g/dL (ref 3.5–5.0)
ALT: 22 U/L (ref 17–63)
AST: 22 U/L (ref 15–41)
Alkaline Phosphatase: 93 U/L (ref 38–126)
Anion gap: 9 (ref 5–15)
BUN: 12 mg/dL (ref 6–20)
CALCIUM: 9.9 mg/dL (ref 8.9–10.3)
CO2: 26 mmol/L (ref 22–32)
Chloride: 102 mmol/L (ref 101–111)
Creatinine, Ser: 1.25 mg/dL — ABNORMAL HIGH (ref 0.61–1.24)
GFR calc Af Amer: >60 mL/min (ref >60)
GFR calc non Af Amer: >60 mL/min (ref >60)
GLUCOSE: 170 mg/dL — AB (ref 65–99)
Potassium: 3.9 mmol/L (ref 3.5–5.1)
SODIUM: 137 mmol/L (ref 135–145)
Total Bilirubin: 0.7 mg/dL (ref 0.3–1.2)
Total Protein: 7.5 g/dL (ref 6.5–8.1)

## 2014-12-02 LAB — CBC
HCT: 49.5 % (ref 39.0–52.0)
Hemoglobin: 16.6 g/dL (ref 13.0–17.0)
MCH: 29.9 pg (ref 26.0–34.0)
MCHC: 33.5 g/dL (ref 30.0–36.0)
MCV: 89 fL (ref 78.0–100.0)
PLATELETS: 299 10*3/uL (ref 150–400)
RBC: 5.56 MIL/uL (ref 4.22–5.81)
RDW: 14.2 % (ref 11.5–15.5)
WBC: 15.3 10*3/uL — AB (ref 4.0–10.5)

## 2014-12-02 LAB — CBG MONITORING, ED: Glucose-Capillary: 177 mg/dL — ABNORMAL HIGH (ref 65–99)

## 2014-12-02 LAB — TROPONIN I: Troponin I: <0.03 ng/mL (ref <0.031)

## 2014-12-02 MED ORDER — SODIUM CHLORIDE 0.9 % IV BOLUS (SEPSIS)
2000.0000 mL | Freq: Once | INTRAVENOUS | Status: AC
  Administered 2014-12-02: 2000 mL via INTRAVENOUS

## 2014-12-02 MED ORDER — SODIUM CHLORIDE 0.9 % IV SOLN
INTRAVENOUS | Status: DC
  Administered 2014-12-03 – 2014-12-04 (×5): via INTRAVENOUS

## 2014-12-02 MED ORDER — HYDROMORPHONE HCL 1 MG/ML IJ SOLN
1.0000 mg | Freq: Once | INTRAMUSCULAR | Status: AC
  Administered 2014-12-02: 1 mg via INTRAVENOUS
  Filled 2014-12-02: qty 1

## 2014-12-02 MED ORDER — LORAZEPAM 2 MG/ML IJ SOLN
1.0000 mg | Freq: Once | INTRAMUSCULAR | Status: AC
  Administered 2014-12-02: 1 mg via INTRAVENOUS
  Filled 2014-12-02: qty 1

## 2014-12-02 NOTE — H&P (Signed)
[facility] History and Physical  SEVRIN [name]:[id number] DOB: Jul 24, 1955 DOA: 12/02/2014  Referring physician: ED physician PCP: Felton Clinton, MD  Specialists:   Chief Complaint: Generalized weakness, dizziness, diarrhea, abdominal pain, dysuria  HPI: Scott Holden is a 59 y.o. male with PMH of hypertension, diabetes mellitus, GERD, TIA, arthritis, chronic back pain, BPH, seizure, possible mild sarcoidosis, OSA, recurrent kidney stone, chronic brhonchitis, tobacco abuse, who presents with generalized weakness, dizziness, diarrhea, abdominal pain, dysuria.  Patient reports that in the past 6 days, he has been having generalized weakness and dizziness. Sometimes he felt that he was going to pass out, but has not. He reports that he had right hip replacement long time ago, since then he has right leg weakness due to hip pain, which has not changed. Other than that, he does not have new unilateral weakness, numbness or tingling sensations.  He has been having abdominal pain for several weeks. His abdominal pain is located in the right lower quadrant, also involving right upper quadrant. It is associated with nausea, but not vomiting. Today he started having diarrhea. He has had 3 bowel movement with loose stool, but no blood in the stools. He possibly he used ciprofloxacin for UTI approximately 1-1/2 month ago. He states that he has hx recurrent bilateral kidney stone, and is not sure whether his abdominal pain is related to his kidney stone. He states that he had mild fever with temperature 100 at home.  Patient also reports dysuria and burning on urination for long time. No increased urinary frequency.  He has chronic mild dry cough and mild shortness of breath due to smoking, which has not changed recently.  Currently patient denies running nose, sore throat, ear pain, headaches, chest pain, constipation, hematuria, skin rashes or leg swelling. No vision change or hearing loss.  In  ED, patient was found to have WBC 15.3, temperature 98.1, tachycardia, AKI, negative chest x-ray for acute abnormalities. 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 Patient is admitted to inpatient for further evaluation and treatment.  Where does patient live?   At home    Can patient participate in ADLs?  Some   Review of Systems:   General: had fevers, chills, poor appetite, has fatigue HEENT: no blurry vision, hearing changes or sore throat Pulm: has dyspnea, has coughing, no wheezing CV: no chest pain, palpitations Abd: has nausea, no vomiting, has abdominal pain, diarrhea GU: has dysuria, burning on urination, no increased urinary frequency, hematuria  Ext: no leg edema Neuro: has chronic right leg weakness, but no new unilateral weakness, numbness, or tingling, no vision change or hearing loss Skin: no rash MSK: No muscle spasm, no deformity, no limitation of range of movement in spin Heme: No easy bruising.  Travel history: No recent long distant travel.  Allergy: No Known Allergies  Past Medical History  Diagnosis Date  . Kidney calculi   . Hypertension   . History of TIAs   . Arthritis   . Back pain     trouble with lumbar 2, 3, and 4 - degenerative disease per pt  . Unspecified transient cerebral ischemia   . HA (headache)   . Syncope and collapse   . BPH (benign prostatic hyperplasia)   . GERD (gastroesophageal reflux disease)   . Diabetes mellitus without complication   . Seizure   . BPH (benign prostatic hyperplasia)   . Sarcoidosis of lung   . Tobacco abuse  Past Surgical History  Procedure Laterality Date  . Total hip arthroplasty    . Joint replacement    . Knee surgery    . Hernia repair  2003    Social History:  reports that he has been smoking Cigarettes.  He has a 10 pack-year smoking history. He has never used smokeless tobacco. He reports that he does not drink alcohol or use illicit  drugs.  Family History:  Family History  Problem Relation Age of Onset  . Cancer Maternal Grandfather     prostate  . Diabetes Mother   . Heart Problems Father      Prior to Admission medications   Medication Sig Start Date End Date Taking? Authorizing Provider  albuterol (PROVENTIL HFA;VENTOLIN HFA) 108 (90 BASE) MCG/ACT inhaler Inhale 1-2 puffs into the lungs every 6 (six) hours as needed for wheezing. 11/27/12  Yes Derwood Kaplan, MD  diltiazem (CARDIZEM CD) 360 MG 24 hr capsule Take 360 mg by mouth every morning. 09/05/14  Yes Historical Provider, MD  dipyridamole-aspirin (AGGRENOX) 200-25 MG per 12 hr capsule Take 1 capsule by mouth 2 (two) times daily. 01/15/14  Yes Nilda Riggs, NP  hydrochlorothiazide (HYDRODIURIL) 25 MG tablet Take 25 mg by mouth every morning.   Yes Historical Provider, MD  ipratropium-albuterol (DUONEB) 0.5-2.5 (3) MG/3ML SOLN Take 3 mLs by nebulization every 4 (four) hours as needed (For shortness of breath). 01/15/13  Yes Laveda Norman, MD  lamoTRIgine (LAMICTAL) 25 MG tablet TAKE 3 TABLETS BY MOUTH TWICE A DAY FOR 1 WEEK THEN TAKE 4 TABLETS BY MOUTH TWICE A DAY Patient taking differently: Take 3 tablets by mouth twice daily for 1 week then take 4 tablets by mouth twice a day. 03/17/14  Yes Nilda Riggs, NP  loratadine (CLARITIN) 10 MG tablet Take 10 mg by mouth every morning.   Yes Historical Provider, MD  losartan (COZAAR) 50 MG tablet Take 50 mg by mouth every morning.   Yes Historical Provider, MD  metFORMIN (GLUCOPHAGE) 500 MG tablet Take 1 tablet by mouth 2 (two) times daily. 10/22/14  Yes Historical Provider, MD  omeprazole (PRILOSEC) 40 MG capsule Take 40 mg by mouth every morning.    Yes Historical Provider, MD  traMADol (ULTRAM) 50 MG tablet Take 50 mg by mouth 3 (three) times daily as needed for moderate pain.   Yes Historical Provider, MD  ciprofloxacin (CIPRO) 500 MG tablet Take 1 tablet (500 mg total) by mouth 2 (two) times daily. Patient  not taking: Reported on 12/02/2014 11/09/14   Arby Barrette, MD  fluticasone [facility]) 50 MCG/ACT nasal spray Place 2 sprays into the nose daily. Patient not taking: Reported on 11/11/2014 04/04/13   Huston Foley, MD  HYDROcodone-acetaminophen (NORCO/VICODIN) 5-325 MG per tablet Take 1 tablet by mouth every 6 (six) hours as needed. Patient not taking: Reported on 12/02/2014 11/06/14   Derwood Kaplan, MD  ondansetron (ZOFRAN ODT) 8 MG disintegrating tablet Take 1 tablet (8 mg total) by mouth every 8 (eight) hours as needed for nausea. 11/06/14   Derwood Kaplan, MD  oxyCODONE (ROXICODONE) 5 MG immediate release tablet Take 1 tablet (5 mg total) by mouth every 6 (six) hours as needed for severe pain. Patient not taking: Reported on 12/02/2014 11/11/14   Roxy Horseman, PA-C  tamsulosin (FLOMAX) 0.4 MG CAPS capsule Take 1 capsule (0.4 mg total) by mouth daily. Patient not taking: Reported on 11/11/2014 11/06/14   Derwood Kaplan, MD    Physical Exam: Filed Vitals:  12/02/14 2021 12/02/14 2200 12/03/14 0000  BP: 112/80 110/65 119/86  Pulse: 110 101 92  Temp: 98.1 F (36.7 C)    TempSrc: Oral    Resp: SpO2: 95% 94% 95%   General: Not in acute distress HEENT:       Eyes: PERRL, EOMI, no scleral icterus.       ENT: No discharge from the ears and nose, no pharynx injection, no tonsillar enlargement.        Neck: No JVD, no bruit, no mass felt. Heme: No neck lymph node enlargement. Cardiac: S1/S2, RRR, tachycardia, No murmurs, No gallops or rubs. Pulm: Good air movement bilaterally. No rales, wheezing, rhonchi or rubs. Abd: Soft, nondistended, tenderness over RUQ and QLQ, no rebound pain, no organomegaly, BS present. Has right CVA tenderness  Ext: No pitting leg edema bilaterally. 2+DP/PT pulse bilaterally. Musculoskeletal: No joint deformities, No joint redness or warmth, no limitation of ROM in spin. Skin: No rashes.  Neuro: Alert, oriented X3, cranial nerves II-XII grossly intact, muscle  strength 3/5 in right leg, 5/5 in other extremities, sensation to light touch intact. Brachial reflex 2+ bilaterally. Knee reflex 1+ bilaterally. Negative Babinski's sign. Normal finger to nose test. Psych: Patient is not psychotic, no suicidal or hemocidal ideation.  Labs on Admission:  Basic Metabolic Panel:  Recent Labs Lab 12/02/14 2059  NA 137  K 3.9  CL 102  CO2 26  GLUCOSE 170*  BUN 12  CREATININE 1.25*  CALCIUM 9.9   Liver Function Tests:  Recent Labs Lab 12/02/14 2059  AST 22  ALT 22  ALKPHOS 93  BILITOT 0.7  PROT 7.5  ALBUMIN 3.9   No results for input(s): LIPASE, AMYLASE in the last 168 hours. No results for input(s): AMMONIA in the last 168 hours. CBC:  Recent Labs Lab 12/02/14 2100  WBC 15.3*  HGB 16.6  HCT 49.5  MCV 89.0  PLT 299   Cardiac Enzymes:  Recent Labs Lab 12/02/14 2059  TROPONINI <0.03    BNP (last 3 results) No results for input(s): BNP in the last 8760 hours.  ProBNP (last 3 results) No results for input(s): PROBNP in the last 8760 hours.  CBG:  Recent Labs Lab 12/02/14 2052  GLUCAP 177*    Radiological Exams on Admission: Dg Chest 2 View  12/02/2014   CLINICAL DATA:  59 year old male with weakness and dizziness for the past 4 days.  EXAM: CHEST  2 VIEW  COMPARISON:  Chest x-ray 02/16/2013.  FINDINGS: Lung volumes are normal. No consolidative airspace disease. No pleural effusions. No pneumothorax. No pulmonary nodule or mass noted. Pulmonary vasculature and the cardiomediastinal silhouette are within normal limits.  IMPRESSION: No radiographic evidence of acute cardiopulmonary disease.   Electronically Signed   By: Trudie Reed M.D.   On: 12/02/2014 21:11   Ct Head Wo Contrast  12/02/2014   CLINICAL DATA:  59 year old male with weakness and dizziness for the past 5 days. Near syncopal event 3 days ago.  EXAM: CT HEAD WITHOUT CONTRAST  TECHNIQUE: Contiguous axial images were obtained from the base of the skull through  the vertex without intravenous contrast.  COMPARISON:  Head CT 04/26/2012.  FINDINGS: Patchy and confluent areas of decreased attenuation are noted throughout the deep and periventricular white matter of the cerebral hemispheres bilaterally, compatible with chronic microvascular ischemic disease. Well-defined areas of low attenuation in the cerebral hemispheres bilaterally, compatible with old lacunar infarcts. No acute intracranial abnormalities. Specifically, no evidence of acute  intracranial hemorrhage, no definite findings of acute/subacute cerebral ischemia, no mass, mass effect, hydrocephalus or abnormal intra or extra-axial fluid collections. Visualized paranasal sinuses and mastoids are well pneumatized. No acute displaced skull fractures are identified.  IMPRESSION: 1. No acute intracranial abnormalities. 2. 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 3. Extensive chronic microvascular ischemic changes in the cerebral white matter, increased compared to prior examination 04/26/2012.   Electronically Signed   By: Trudie Reed M.D.   On: 12/02/2014 23:00    EKG: Independently reviewed.  Abnormal findings:   Bifascicular block, QTC 516 (patient had old left axis deviation, anterior fascicular block on previous EKG 02/16/13)  Assessment/Plan Principal Problem:   Weakness Active Problems:   HYPERTENSION, BENIGN ESSENTIAL   Transient cerebral ischemia   Allergic rhinitis   RENAL CALCULUS   DEGENERATIVE DISC DISEASE, LUMBAR SPINE   HA (headache)   Chronic respiratory failure   Pulmonary sarcoidosis   Chronic bronchitis   Seizures   BPH (benign prostatic hyperplasia)   GERD (gastroesophageal reflux disease)   Diabetes mellitus without complication   Dizziness   Generalized weakness   Leukocytosis   Tobacco abuse   Abdominal pain   Diarrhea   Dysuria   AKI (acute kidney injury)   Sepsis   UTI (lower urinary tract infection)  Generalized weakness:  Etiology is not clear. He has chronic right leg weakness, which is due to right hip problem, and has not changed per patient. No signs of stroke. It is likely due to deconditioning 2/2 multiple comorbidities. His worsening weakness is likely due to ongoing diarrhea and possible UTI.  -will admit to tele bed -PT/OT -treat possible UTI and diarrhea as below  Abdominal pain, diarrhea and sepsis: Likely due to viral infection, but need to rule out C. difficile colitis since patient has history of antibiotic use. Patient is septic on admission with leukocytosis and tachycardia. He also had fever at home. Currently hemodynamically stable. -will check c diff PCR and GI pathogen panel -will get Procalcitonin and trend lactic acid level per sepsis protocol -IVF: received 2.5L of NS bolus in ED, followed by 125 cc/h -Hydroxyzine for nausea, Norco for pain -lipase -CT-abdomen/pelvis  Possible UTI: Urinalysis showed trace amount of leukocytes. He has dysuria and burning on urination. -start ciprofloxacin which may also cover possible intra-abdominal bacterial infection or diarrhea. -Follow-up blood culture and urine culture -IV fluid as above  AKI: likely due to prerenal secondary to dehydration and continuation of ARB, diruetics -IVF as above -Check FeUrea -follow up CT-abd/pelvise to r/o other nephrosis -Hold losartan and HCTZ -Follow up renal function by BMP  Transient cerebral ischemia: -Continue Aggrenox  Abnormal EKG: Patient has a new bifascicular block, but no chest pain. -Troponin 3  -repeat EKG morning  DM-II: Last A1c 6.3 on 01/14/13.  well controled. Patient is taking metformin at home -SSI -Check A1c  Chronic bronchitis: Stable, no signs of acute exacerbation. Chest x-ray negative for acute abnormalities. -DuoNeb and albuterol when necessary -Mucinex for cough  Seizure:  -middle toe  GERD: -pepcide  HTN:  -Hold losartan and HCTZ due to acute renal injury -IV  hydralazine when necessary -continue Cardizem, but decrease dose from 360-180 due to sepsis.   DVT ppx: SQ Heparin      Code Status: Full code Family Communication:    Yes, patient's  son at bed side Disposition Plan: Admit to inpatient   Date of Service 12/03/2014    Lorretta Harp [facility] Pager [phone]  If 7PM-7AM, please contact night-coverage [email or web address] Password [facility] 12/03/2014, 12:35 AM

## 2014-12-02 NOTE — ED Notes (Signed)
Pt states that he has felt weak and dizzy since Fri evening; pt states that he was getting off the toilet on Sunday morning and went to his knees due to dizziness and near syncope; pt c/o generalized weakness; pt states that he has "mini strokes" and feels like he is in a constant state of having them

## 2014-12-02 NOTE — ED Provider Notes (Signed)
CSN: [id number]     Arrival date & time 12/02/14  2017 History   First MD Initiated Contact with Patient 12/02/14 2025     Chief Complaint  Patient presents with  . Weakness  . Dizziness     (Consider location/radiation/quality/duration/timing/severity/associated sxs/prior Treatment) HPI Comments: Patient here complaining of one-week history of worsening diffuse whole-body weakness has been persistent. Denies any focality to this. No headache, vomiting, neck pain. Denies any chest pain but does note some dyspnea without cough. He denies any anginal type symptoms. Denies abdominal pain. No orthopnea noted. Did have a near syncopal event. Patient had been on chronic pain medication but self weaned himself for the past month. Does note he has been somewhat anxious. Symptoms have been persistent and nothing makes them worse. No treatment used for this prior to arrival  The history is provided by the patient.    Past Medical History  Diagnosis Date  . Kidney calculi   . Hypertension   . History of TIAs   . Arthritis   . Back pain     trouble with lumbar 2, 3, and 4 - degenerative disease per pt  . Unspecified transient cerebral ischemia   . HA (headache)   . Syncope and collapse    Past Surgical History  Procedure Laterality Date  . Total hip arthroplasty    . Joint replacement    . Knee surgery    . Hernia repair  2003   Family History  Problem Relation Age of Onset  . Cancer Maternal Grandfather     prostate  . Diabetes Mother   . Heart Problems Father    History  Substance Use Topics  . Smoking status: Current Some Day Smoker -- 0.50 packs/day for 20 years    Types: Cigarettes    Last Attempt to Quit: 10/24/2012  . Smokeless tobacco: Never Used     Comment: three cigarettes daily  . Alcohol Use: No    Review of Systems  All other systems reviewed and are negative.     Allergies  Review of patient's allergies indicates no known allergies.  Home Medications    Prior to Admission medications   Medication Sig Start Date End Date Taking? Authorizing Provider  albuterol (PROVENTIL HFA;VENTOLIN HFA) 108 (90 BASE) MCG/ACT inhaler Inhale 1-2 puffs into the lungs every 6 (six) hours as needed for wheezing. 11/27/12   Derwood Kaplan, MD  ciprofloxacin (CIPRO) 500 MG tablet Take 1 tablet (500 mg total) by mouth 2 (two) times daily. 11/09/14   Arby Barrette, MD  diltiazem (CARDIZEM CD) 360 MG 24 hr capsule Take 360 mg by mouth every morning. 09/05/14   Historical Provider, MD  diltiazem (DILACOR XR) 240 MG 24 hr capsule Take 240 mg by mouth every morning.     Historical Provider, MD  dipyridamole-aspirin (AGGRENOX) 200-25 MG per 12 hr capsule Take 1 capsule by mouth 2 (two) times daily. 01/15/14   Nilda Riggs, NP  fentaNYL (DURAGESIC - DOSED MCG/HR) 25 MCG/HR patch Place 25 mcg onto the skin every 3 (three) days.    Historical Provider, MD  fluticasone (FLONASE) 50 MCG/ACT nasal spray Place 2 sprays into the nose daily. Patient not taking: Reported on 11/11/2014 04/04/13   Huston Foley, MD  hydrochlorothiazide (HYDRODIURIL) 25 MG tablet Take 25 mg by mouth every morning.    Historical Provider, MD  HYDROcodone-acetaminophen (NORCO/VICODIN) 5-325 MG per tablet Take 1 tablet by mouth every 6 (six) hours as needed. 11/06/14   Ankit Nanavati,  MD  ipratropium-albuterol (DUONEB) 0.5-2.5 (3) MG/3ML SOLN Take 3 mLs by nebulization every 4 (four) hours as needed (For shortness of breath). Patient not taking: Reported on 11/11/2014 01/15/13   Laveda Norman, MD  lamoTRIgine (LAMICTAL) 25 MG tablet TAKE 3 TABLETS BY MOUTH TWICE A DAY FOR 1 WEEK THEN TAKE 4 TABLETS BY MOUTH TWICE A DAY Patient taking differently: Take 3 tablets by mouth twice daily for 1 week then take 4 tablets by mouth twice a day. 03/17/14   Nilda Riggs, NP  loratadine (CLARITIN) 10 MG tablet Take 10 mg by mouth every morning.    Historical Provider, MD  losartan (COZAAR) 50 MG tablet Take 50 mg by  mouth every morning.    Historical Provider, MD  metFORMIN (GLUCOPHAGE) 500 MG tablet Take 1 tablet by mouth 2 (two) times daily. 10/22/14   Historical Provider, MD  omeprazole (PRILOSEC) 40 MG capsule Take 40 mg by mouth every morning.     Historical Provider, MD  ondansetron (ZOFRAN ODT) 8 MG disintegrating tablet Take 1 tablet (8 mg total) by mouth every 8 (eight) hours as needed for nausea. 11/06/14   Derwood Kaplan, MD  oxyCODONE (ROXICODONE) 5 MG immediate release tablet Take 1 tablet (5 mg total) by mouth every 6 (six) hours as needed for severe pain. 11/11/14   Roxy Horseman, PA-C  oxyCODONE-acetaminophen (PERCOCET) 10-325 MG per tablet Take 1 tablet by mouth every 4 (four) hours as needed for pain.    Historical Provider, MD  tamsulosin (FLOMAX) 0.4 MG CAPS capsule Take 1 capsule (0.4 mg total) by mouth daily. Patient not taking: Reported on 11/11/2014 11/06/14   Derwood Kaplan, MD   BP 112/80 mmHg  Pulse 110  Temp(Src) 98.1 F (36.7 C) (Oral)  Resp 18  SpO2 95% Physical Exam  Constitutional: He is oriented to person, place, and time. He appears well-developed and well-nourished.  Non-toxic appearance. No distress.  HENT:  Head: Normocephalic and atraumatic.  Eyes: Conjunctivae, EOM and lids are normal. Pupils are equal, round, and reactive to light.  Neck: Normal range of motion. Neck supple. No tracheal deviation present. No thyroid mass present.  Cardiovascular: Regular rhythm and normal heart sounds.  Tachycardia present.  Exam reveals no gallop.   No murmur heard. Pulmonary/Chest: Effort normal and breath sounds normal. No stridor. No respiratory distress. He has no decreased breath sounds. He has no wheezes. He has no rhonchi. He has no rales.  Abdominal: Soft. Normal appearance and bowel sounds are normal. He exhibits no distension. There is no tenderness. There is no rebound and no CVA tenderness.  Musculoskeletal: Normal range of motion. He exhibits no edema or tenderness.   Neurological: He is alert and oriented to person, place, and time. He has normal strength. He displays no tremor. No cranial nerve deficit or sensory deficit. GCS eye subscore is 4. GCS verbal subscore is 5. GCS motor subscore is 6.  No nystagmus  Skin: Skin is warm and dry. No abrasion and no rash noted.  Psychiatric: His speech is normal and behavior is normal. His mood appears anxious.  Nursing note and vitals reviewed.   ED Course  Procedures (including critical care time) Labs Review Labs Reviewed  CBC  COMPREHENSIVE METABOLIC PANEL  TROPONIN I  URINALYSIS, ROUTINE W REFLEX MICROSCOPIC (NOT AT [facility])  CBG MONITORING, ED    Imaging Review No results found.   EKG Interpretation None      MDM   Final diagnoses:  SOB (shortness of breath)  ED ECG REPORT   Date: 12/02/2014  Rate: 76  Rhythm: normal sinus rhythm  QRS Axis: normal  Intervals: normal  ST/T Wave abnormalities: nonspecific ST changes  Conduction Disutrbances:right bundle branch block  Narrative Interpretation:   Old EKG Reviewed: changes noted  I have personally reviewed the EKG tracing and agree with the computerized printout as noted.   Patient given IV fluids and Ativan continues to note weakness. Head CT without acute findings. She remains mildly tachycardic. Due to his increasing weakness and dizziness will need to have an MRI which cannot be obtained at this time. Patient will be admitted for observation.  Lorre Nick, MD 12/02/14 [phone]

## 2014-12-03 ENCOUNTER — Inpatient Hospital Stay (HOSPITAL_COMMUNITY): Payer: Medicaid Other

## 2014-12-03 ENCOUNTER — Encounter (HOSPITAL_COMMUNITY): Payer: Self-pay | Admitting: *Deleted

## 2014-12-03 DIAGNOSIS — K2100 Gastro-esophageal reflux disease with esophagitis, without bleeding: Secondary | ICD-10-CM | POA: Insufficient documentation

## 2014-12-03 DIAGNOSIS — N179 Acute kidney failure, unspecified: Secondary | ICD-10-CM | POA: Diagnosis present

## 2014-12-03 DIAGNOSIS — E440 Moderate protein-calorie malnutrition: Secondary | ICD-10-CM | POA: Insufficient documentation

## 2014-12-03 DIAGNOSIS — R300 Dysuria: Secondary | ICD-10-CM | POA: Diagnosis present

## 2014-12-03 DIAGNOSIS — R109 Unspecified abdominal pain: Secondary | ICD-10-CM | POA: Diagnosis present

## 2014-12-03 DIAGNOSIS — R531 Weakness: Secondary | ICD-10-CM

## 2014-12-03 DIAGNOSIS — A419 Sepsis, unspecified organism: Secondary | ICD-10-CM | POA: Diagnosis present

## 2014-12-03 DIAGNOSIS — N390 Urinary tract infection, site not specified: Secondary | ICD-10-CM | POA: Diagnosis present

## 2014-12-03 DIAGNOSIS — R197 Diarrhea, unspecified: Secondary | ICD-10-CM | POA: Diagnosis present

## 2014-12-03 LAB — URINALYSIS, ROUTINE W REFLEX MICROSCOPIC
Bilirubin Urine: NEGATIVE
GLUCOSE, UA: NEGATIVE mg/dL
HGB URINE DIPSTICK: NEGATIVE
Ketones, ur: NEGATIVE mg/dL
NITRITE: NEGATIVE
PH: 6 (ref 5.0–8.0)
PROTEIN: NEGATIVE mg/dL
Specific Gravity, Urine: 1.018 (ref 1.005–1.030)
Urobilinogen, UA: 1 mg/dL (ref 0.0–1.0)

## 2014-12-03 LAB — CBC
HEMATOCRIT: 41.9 % (ref 39.0–52.0)
Hemoglobin: 13.3 g/dL (ref 13.0–17.0)
MCH: 28.3 pg (ref 26.0–34.0)
MCHC: 31.7 g/dL (ref 30.0–36.0)
MCV: 89.1 fL (ref 78.0–100.0)
Platelets: 260 10*3/uL (ref 150–400)
RBC: 4.7 MIL/uL (ref 4.22–5.81)
RDW: 14.3 % (ref 11.5–15.5)
WBC: 15.5 10*3/uL — ABNORMAL HIGH (ref 4.0–10.5)

## 2014-12-03 LAB — BASIC METABOLIC PANEL
ANION GAP: 6 (ref 5–15)
BUN: 13 mg/dL (ref 6–20)
CO2: 28 mmol/L (ref 22–32)
Calcium: 8.8 mg/dL — ABNORMAL LOW (ref 8.9–10.3)
Chloride: 106 mmol/L (ref 101–111)
Creatinine, Ser: 1.14 mg/dL (ref 0.61–1.24)
GFR calc non Af Amer: >60 mL/min (ref >60)
GLUCOSE: 84 mg/dL (ref 65–99)
Potassium: 3.8 mmol/L (ref 3.5–5.1)
SODIUM: 140 mmol/L (ref 135–145)

## 2014-12-03 LAB — GLUCOSE, CAPILLARY
GLUCOSE-CAPILLARY: 137 mg/dL — AB (ref 65–99)
Glucose-Capillary: 79 mg/dL (ref 65–99)
Glucose-Capillary: 154 mg/dL — ABNORMAL HIGH (ref 65–99)
Glucose-Capillary: 102 mg/dL — ABNORMAL HIGH (ref 65–99)

## 2014-12-03 LAB — RAPID URINE DRUG SCREEN, HOSP PERFORMED
Amphetamines: NOT DETECTED
BARBITURATES: NOT DETECTED
BENZODIAZEPINES: POSITIVE — AB
Cocaine: NOT DETECTED
Opiates: POSITIVE — AB
TETRAHYDROCANNABINOL: NOT DETECTED

## 2014-12-03 LAB — LACTIC ACID, PLASMA
LACTIC ACID, VENOUS: 0.6 mmol/L (ref 0.5–2.0)
Lactic Acid, Venous: 1.4 mmol/L (ref 0.5–2.0)

## 2014-12-03 LAB — PROTIME-INR
INR: 1.02 (ref 0.00–1.49)
Prothrombin Time: 13.6 seconds (ref 11.6–15.2)

## 2014-12-03 LAB — PROCALCITONIN

## 2014-12-03 LAB — TROPONIN I

## 2014-12-03 LAB — LIPASE, BLOOD: Lipase: 18 U/L — ABNORMAL LOW (ref 22–51)

## 2014-12-03 LAB — URINE MICROSCOPIC-ADD ON

## 2014-12-03 LAB — APTT: APTT: 30 s (ref 24–37)

## 2014-12-03 MED ORDER — ACETAMINOPHEN 325 MG PO TABS: 650.0000 mg | ORAL_TABLET | Freq: Four times a day (QID) | ORAL | Status: DC | PRN

## 2014-12-03 MED ORDER — HEPARIN SODIUM (PORCINE) 5000 UNIT/ML IJ SOLN
5000.0000 [IU] | Freq: Three times a day (TID) | INTRAMUSCULAR | Status: DC
  Administered 2014-12-03 – 2014-12-05 (×8): 5000 [IU] via SUBCUTANEOUS
  Filled 2014-12-03 (×8): qty 1

## 2014-12-03 MED ORDER — NICOTINE 14 MG/24HR TD PT24
14.0000 mg | MEDICATED_PATCH | Freq: Every day | TRANSDERMAL | Status: DC
  Administered 2014-12-03 – 2014-12-05 (×3): 14 mg via TRANSDERMAL
  Filled 2014-12-03 (×3): qty 1

## 2014-12-03 MED ORDER — HYDROXYZINE HCL 50 MG/ML IM SOLN
25.0000 mg | Freq: Four times a day (QID) | INTRAMUSCULAR | Status: DC | PRN
  Filled 2014-12-03: qty 0.5

## 2014-12-03 MED ORDER — SODIUM CHLORIDE 0.9 % IJ SOLN
3.0000 mL | Freq: Two times a day (BID) | INTRAMUSCULAR | Status: DC
  Administered 2014-12-03 – 2014-12-05 (×2): 3 mL via INTRAVENOUS

## 2014-12-03 MED ORDER — MORPHINE SULFATE 2 MG/ML IJ SOLN
2.0000 mg | INTRAMUSCULAR | Status: DC | PRN
  Administered 2014-12-03 – 2014-12-05 (×13): 2 mg via INTRAVENOUS
  Filled 2014-12-03 (×13): qty 1

## 2014-12-03 MED ORDER — HYDRALAZINE HCL 20 MG/ML IJ SOLN: 5.0000 mg | INTRAMUSCULAR | Status: DC | PRN

## 2014-12-03 MED ORDER — GUAIFENESIN-DM 100-10 MG/5ML PO SYRP: 5.0000 mL | ORAL_SOLUTION | ORAL | Status: DC | PRN

## 2014-12-03 MED ORDER — FAMOTIDINE IN NACL 20-0.9 MG/50ML-% IV SOLN
20.0000 mg | Freq: Two times a day (BID) | INTRAVENOUS | Status: DC
  Administered 2014-12-03 – 2014-12-05 (×5): 20 mg via INTRAVENOUS
  Filled 2014-12-03 (×5): qty 50

## 2014-12-03 MED ORDER — ACETAMINOPHEN 650 MG RE SUPP: 650.0000 mg | Freq: Four times a day (QID) | RECTAL | Status: DC | PRN

## 2014-12-03 MED ORDER — ALBUTEROL SULFATE (2.5 MG/3ML) 0.083% IN NEBU: 3.0000 mL | INHALATION_SOLUTION | Freq: Four times a day (QID) | RESPIRATORY_TRACT | Status: DC | PRN

## 2014-12-03 MED ORDER — CETYLPYRIDINIUM CHLORIDE 0.05 % MT LIQD
7.0000 mL | Freq: Two times a day (BID) | OROMUCOSAL | Status: DC
  Administered 2014-12-03 – 2014-12-04 (×4): 7 mL via OROMUCOSAL

## 2014-12-03 MED ORDER — CIPROFLOXACIN IN D5W 400 MG/200ML IV SOLN
400.0000 mg | Freq: Two times a day (BID) | INTRAVENOUS | Status: DC
Start: 2014-12-03 — End: 2014-12-05
  Administered 2014-12-03 – 2014-12-05 (×5): 400 mg via INTRAVENOUS
  Filled 2014-12-03 (×6): qty 200

## 2014-12-03 MED ORDER — CHLORHEXIDINE GLUCONATE 0.12 % MT SOLN
15.0000 mL | Freq: Two times a day (BID) | OROMUCOSAL | Status: DC
  Administered 2014-12-03 – 2014-12-04 (×2): 15 mL via OROMUCOSAL
  Filled 2014-12-03 (×2): qty 15

## 2014-12-03 MED ORDER — SODIUM CHLORIDE 0.9 % IV BOLUS (SEPSIS)
500.0000 mL | Freq: Once | INTRAVENOUS | Status: AC
  Administered 2014-12-03: 500 mL via INTRAVENOUS

## 2014-12-03 MED ORDER — INSULIN ASPART 100 UNIT/ML ~~LOC~~ SOLN
0.0000 [IU] | Freq: Three times a day (TID) | SUBCUTANEOUS | Status: DC
  Administered 2014-12-03: 2 [IU] via SUBCUTANEOUS
  Administered 2014-12-03: 1 [IU] via SUBCUTANEOUS

## 2014-12-03 MED ORDER — LORATADINE 10 MG PO TABS
10.0000 mg | ORAL_TABLET | Freq: Every morning | ORAL | Status: DC
  Administered 2014-12-03 – 2014-12-05 (×3): 10 mg via ORAL
  Filled 2014-12-03 (×3): qty 1

## 2014-12-03 MED ORDER — DILTIAZEM HCL ER COATED BEADS 180 MG PO CP24
180.0000 mg | ORAL_CAPSULE | Freq: Every morning | ORAL | Status: DC
  Administered 2014-12-03 – 2014-12-05 (×3): 180 mg via ORAL
  Filled 2014-12-03 (×3): qty 1

## 2014-12-03 MED ORDER — ASPIRIN-DIPYRIDAMOLE ER 25-200 MG PO CP12
1.0000 | ORAL_CAPSULE | Freq: Two times a day (BID) | ORAL | Status: DC
  Administered 2014-12-03 – 2014-12-05 (×5): 1 via ORAL
  Filled 2014-12-03 (×5): qty 1

## 2014-12-03 MED ORDER — HYDROCODONE-ACETAMINOPHEN 5-325 MG PO TABS
1.0000 | ORAL_TABLET | Freq: Four times a day (QID) | ORAL | Status: DC | PRN
  Administered 2014-12-03 – 2014-12-04 (×7): 1 via ORAL
  Filled 2014-12-03 (×7): qty 1

## 2014-12-03 MED ORDER — IPRATROPIUM-ALBUTEROL 0.5-2.5 (3) MG/3ML IN SOLN: 3.0000 mL | RESPIRATORY_TRACT | Status: DC | PRN

## 2014-12-03 MED ORDER — BOOST / RESOURCE BREEZE PO LIQD
1.0000 | Freq: Two times a day (BID) | ORAL | Status: DC
  Administered 2014-12-03 – 2014-12-05 (×4): 1 via ORAL

## 2014-12-03 MED ORDER — LAMOTRIGINE 100 MG PO TABS
100.0000 mg | ORAL_TABLET | Freq: Two times a day (BID) | ORAL | Status: DC
Start: 2014-12-03 — End: 2014-12-05
  Administered 2014-12-03 – 2014-12-05 (×5): 100 mg via ORAL
  Filled 2014-12-03 (×6): qty 1

## 2014-12-03 NOTE — Progress Notes (Signed)
[facility] PROGRESS NOTE  Scott Holden:[id number] DOB: Dec 19, 1955 DOA: 12/02/2014 PCP: Felton Clinton, MD  Assessment/Plan: Generalized weakness: Etiology is not clear. He has chronic right leg weakness, which is due to right hip problem, and has not changed per patient. No signs of stroke. It is likely due to deconditioning 2/2 multiple comorbidities Agree that his worsening weakness is likely due to ongoing diarrhea and possible UTI. - obtain PT evaluation  Abdominal pain, diarrhea and sepsis: Likely due to viral infection, but need to rule out C. difficile colitis since patient has history of antibiotic use. Patient is septic on admission with leukocytosis and tachycardia. Had fever at home reportedly.  - c diff PCR and GI pathogen panel pending -CT-abdomen/pelvis reviewed and reporting multiple nonobstructive calculi in the collecting systems of the kidneys bilaterally, normal appendix  Possible UTI: Urinalysis showed trace amount of leukocytes. He has dysuria and burning on urination. - Continue ciprofloxacin patient currently improving on this regimen - No urine culture obtained and if obtained now results would be skewed as patient has been started on Antibiotics.  AKI: likely due to prerenal secondary to dehydration and continuation of ARB, diruetics -IVF as above -Check FeUrea -follow up CT-abd/pelvise to r/o other nephrosis -Hold losartan and HCTZ -Follow up renal function by BMP  Transient cerebral ischemia: -Continue Aggrenox  Abnormal EKG: Patient has a new bifascicular block, but no chest pain. -Troponin 3  -repeat EKG morning  DM-II: Last A1c 6.3 on 01/14/13. well controled. Patient is taking metformin at home -SSI -Check A1c  Chronic bronchitis: Stable, no signs of acute exacerbation. Chest x-ray negative for acute abnormalities. -DuoNeb and albuterol when necessary -Mucinex for cough  Seizure:  -middle toe  GERD: -pepcide  HTN:  -Hold  losartan and HCTZ due to acute renal injury -IV hydralazine when necessary -continue current dose of Cardizem  Code Status: Full Family Communication: Discussed directly with patient Disposition Plan: Pending improvement in condition   Consultants:  None  Procedures:  None  Antibiotics:  cipro  HPI/Subjective: Pt states that he feels better. No new complaints.  Objective: Filed Vitals:   12/03/14 1407  BP: 123/71  Pulse: 69  Temp: 97.6 F (36.4 C)  Resp: 24    Intake/Output Summary (Last 24 hours) at 12/03/14 1733 Last data filed at 12/03/14 1545  Gross per 24 hour  Intake 2058.75 ml  Output    825 ml  Net 1233.75 ml   Filed Weights   12/03/14 0050  Weight: 133.267 kg (293 lb 12.8 oz)    Exam:   General:  Pt in nad, alert and awake  Cardiovascular: rrr, no mrg  Respiratory: no increased wob, no wheezes  Abdomen: soft, obese, no guarding  Musculoskeletal: no cyanosis or clubbing  Data Reviewed: Basic Metabolic Panel:  Recent Labs Lab 12/02/14 2059 12/03/14 0428  NA 137 140  K 3.9 3.8  CL 102 106  CO2 26 28  GLUCOSE 170* 84  BUN 12 13  CREATININE 1.25* 1.14  CALCIUM 9.9 8.8*   Liver Function Tests:  Recent Labs Lab 12/02/14 2059  AST 22  ALT 22  ALKPHOS 93  BILITOT 0.7  PROT 7.5  ALBUMIN 3.9    Recent Labs Lab 12/03/14 0600  LIPASE 18*   No results for input(s): AMMONIA in the last 168 hours. CBC:  Recent Labs Lab 12/02/14 2100 12/03/14 0428  WBC 15.3* 15.5*  HGB 16.6 13.3  HCT 49.5 41.9  MCV 89.0 89.1  PLT 299 260  Cardiac Enzymes:  Recent Labs Lab 12/02/14 2059 12/03/14 0120 12/03/14 0645 12/03/14 1225  TROPONINI <0.03 <0.03 <0.03 <0.03   BNP (last 3 results) No results for input(s): BNP in the last 8760 hours.  ProBNP (last 3 results) No results for input(s): PROBNP in the last 8760 hours.  CBG:  Recent Labs Lab 12/02/14 2052 12/03/14 0816 12/03/14 1208 12/03/14 1605  GLUCAP 177* 79  137* 154*    No results found for this or any previous visit (from the past 240 hour(s)).   Studies: Ct Abdomen Pelvis Wo Contrast  12/03/2014   CLINICAL DATA:  59 year old male with right lower quadrant and right-sided flank pain for 1 month. Dysuria. History of multiple stones. Prior hernia surgery.  EXAM: CT ABDOMEN AND PELVIS WITHOUT CONTRAST  TECHNIQUE: Multidetector CT imaging of the abdomen and pelvis was performed following the standard protocol without IV contrast.  COMPARISON:  CT of the abdomen and pelvis 11/09/2014.  FINDINGS: Lower chest: Atherosclerotic calcifications in the right coronary artery. Calcifications of the aortic valve.  Hepatobiliary: No definite cystic or solid hepatic lesions are identified on today's noncontrast CT examination. The unenhanced appearance of the gallbladder is normal.  Pancreas: No definite pancreatic mass or peripancreatic inflammatory changes on today's noncontrast CT examination.  Spleen: Unremarkable.  Adrenals/Urinary Tract: Multiple small nonobstructive calculi are present within the collecting systems of the kidneys bilaterally, largest of which measures 7 mm in the interpolar collecting system of the right kidney. No ureteral stones or bladder stones. No hydroureteronephrosis to indicate urinary tract obstruction at this time. The unenhanced appearance of the urinary bladder is normal. Bilateral adrenal glands are normal in appearance.  Stomach/Bowel: The unenhanced appearance of the stomach is normal. No pathologic dilatation of small bowel or colon. Normal appendix.  Vascular/Lymphatic: Atherosclerotic calcifications throughout the abdominal and pelvic vasculature, without definite aneurysm. No lymphadenopathy is noted in the abdomen or pelvis on today's noncontrast CT examination. Multiple prominent borderline enlarged right-sided pelvic lymph nodes are noted, but are nonspecific, measuring up to 9 mm in short axis in the right external iliac nodal  stations.  Reproductive: Prostate gland appears heterogeneous and has multiple internal calcifications (nonspecific). Seminal vesicles are normal in appearance.  Other: No significant volume of ascites.  No pneumoperitoneum.  Musculoskeletal: Status post right total hip arthroplasty. There are no aggressive appearing lytic or blastic lesions noted in the visualized portions of the skeleton.  IMPRESSION: 1. Multiple nonobstructive calculi in the collecting systems of the kidneys bilaterally (right greater than left), with the largest of these measuring 7 mm in the interpolar collecting system of the right kidney. No ureteral stones or findings of urinary tract obstruction are noted at this time. 2. Normal appendix. 3. Atherosclerosis, including right coronary artery disease. Please note that although the presence of coronary artery calcium documents the presence of coronary artery disease, the severity of this disease and any potential stenosis cannot be assessed on this non-gated CT examination. Assessment for potential risk factor modification, dietary therapy or pharmacologic therapy may be warranted, if clinically indicated. 4. Additional incidental findings, as above.   Electronically Signed   By: Trudie Reed M.D.   On: 12/03/2014 08:23   Dg Chest 2 View  12/02/2014   CLINICAL DATA:  59 year old male with weakness and dizziness for the past 4 days.  EXAM: CHEST  2 VIEW  COMPARISON:  Chest x-ray 02/16/2013.  FINDINGS: Lung volumes are normal. No consolidative airspace disease. No pleural effusions. No pneumothorax. No pulmonary nodule or  mass noted. Pulmonary vasculature and the cardiomediastinal silhouette are within normal limits.  IMPRESSION: No radiographic evidence of acute cardiopulmonary disease.   Electronically Signed   By: Trudie Reed M.D.   On: 12/02/2014 21:11   Ct Head Wo Contrast  12/02/2014   CLINICAL DATA:  59 year old male with weakness and dizziness for the past 5 days. Near  syncopal event 3 days ago.  EXAM: CT HEAD WITHOUT CONTRAST  TECHNIQUE: Contiguous axial images were obtained from the base of the skull through the vertex without intravenous contrast.  COMPARISON:  Head CT 04/26/2012.  FINDINGS: Patchy and confluent areas of decreased attenuation are noted throughout the deep and periventricular white matter of the cerebral hemispheres bilaterally, compatible with chronic microvascular ischemic disease. Well-defined areas of low attenuation in the cerebral hemispheres bilaterally, compatible with old lacunar infarcts. No acute intracranial abnormalities. Specifically, no evidence of acute intracranial hemorrhage, no definite findings of acute/subacute cerebral ischemia, no mass, mass effect, hydrocephalus or abnormal intra or extra-axial fluid collections. Visualized paranasal sinuses and mastoids are well pneumatized. No acute displaced skull fractures are identified.  IMPRESSION: 1. No acute intracranial abnormalities. 2. 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 3. Extensive chronic microvascular ischemic changes in the cerebral white matter, increased compared to prior examination 04/26/2012.   Electronically Signed   By: Trudie Reed M.D.   On: 12/02/2014 23:00    Scheduled Meds: . antiseptic oral rinse  7 mL Mouth Rinse q12n4p  . chlorhexidine  15 mL Mouth Rinse BID  . ciprofloxacin  400 mg Intravenous Q12H  . diltiazem  180 mg Oral q morning - 10a  . dipyridamole-aspirin  1 capsule Oral BID  . famotidine (PEPCID) IV  20 mg Intravenous Q12H  . feeding supplement (RESOURCE BREEZE)  1 Container Oral BID BM  . heparin  5,000 Units Subcutaneous 3 times per day  . insulin aspart  0-9 Units Subcutaneous TID WC  . lamoTRIgine  100 mg Oral BID  . loratadine  10 mg Oral q morning - 10a  . nicotine  14 mg Transdermal Daily  . sodium chloride  3 mL Intravenous Q12H   Continuous Infusions: . sodium chloride 125 mL/hr at 12/03/14 0103     Principal Problem:   Weakness Active Problems:   HYPERTENSION, BENIGN ESSENTIAL   Transient cerebral ischemia   Allergic rhinitis   RENAL CALCULUS   DEGENERATIVE DISC DISEASE, LUMBAR SPINE   HA (headache)   Chronic respiratory failure   Pulmonary sarcoidosis   Chronic bronchitis   Seizures   BPH (benign prostatic hyperplasia)   GERD (gastroesophageal reflux disease)   Diabetes mellitus without complication   Dizziness   Generalized weakness   Leukocytosis   Tobacco abuse   Abdominal pain   Diarrhea   Dysuria   AKI (acute kidney injury)   Sepsis   UTI (lower urinary tract infection)   Malnutrition of moderate degree    Time spent: > 35 minutes    Penny Pia  [facility] Pager [phone] If 7PM-7AM, please contact night-coverage at [email or web address], password [facility] 12/03/2014, 5:33 PM  LOS: 1 day

## 2014-12-03 NOTE — Progress Notes (Signed)
Initial Nutrition Assessment    DOCUMENTATION CODES:  Non-severe (moderate) malnutrition in context of acute illness/injury, Morbid obesity  INTERVENTION: - Will order Resource Breeze po BID, each supplement provides 250 kcal and 9 grams of protein - RD will continue to monitor for needs  NUTRITION DIAGNOSIS:  Inadequate oral intake related to acute illness, nausea as evidenced by per patient/family report.  GOAL:  Patient will meet greater than or equal to 90% of their needs  MONITOR:  Diet advancement, PO intake, Supplement acceptance, Weight trends, Labs, I & O's  REASON FOR ASSESSMENT:  Malnutrition Screening Tool  ASSESSMENT: 59 y.o. male with PMH of hypertension, diabetes mellitus, GERD, TIA, arthritis, chronic back pain, BPH, seizure, possible mild sarcoidosis, OSA, recurrent kidney stone, chronic brhonchitis, tobacco abuse, who presents with generalized weakness, dizziness, diarrhea, abdominal pain, dysuria. Patient reports that in the past 6 days, he has been having generalized weakness and dizziness. Sometimes he felt that he was going to pass out, but has not. He reports that he had right hip replacement long time ago, since then he has right leg weakness due to hip pain. He has been having abdominal pain for several weeks. His abdominal pain is located in the right lower quadrant, also involving right upper quadrant. It is associated with nausea, but not vomiting.   Pt seen for MST. He ate 100% of CLD tray late this AM. Pt reports poor appetite for the past 1 month and indicates that he would eat something small, like a sandwich, and then feel full which was unusual for him. He is unsure if abdominal pain was worse after intakes. He states he has been feeling nauseous for the past couple of days but has not vomited.   Pt indicates that 3 weeks ago he weighed 318 lbs which indicates 25 lb weight loss (8% body weight) in this time frame. He indicates that he had wanted to  lose weight to alleviate back and hip pain but that this weight loss was not fully intentional and was more due to lack of appetite and resultant decrease in intakes.  Not meeting needs/unable to meet needs with CLD. Will order Resource Breeze to supplement. No muscle or fat wasting. Pt not meeting needs PTA. Labs and medications reviewed.  Height:  Ht Readings from Last 1 Encounters:  12/03/14  (1.778 m)    Weight:  Wt Readings from Last 1 Encounters:  12/03/14 293 lb 12.8 oz (133.267 kg)    Ideal Body Weight:  75.5 kg (kg)  Wt Readings from Last 10 Encounters:  12/03/14 293 lb 12.8 oz (133.267 kg)  11/11/14 295 lb (133.811 kg)  11/09/14 298 lb (135.172 kg)  01/15/14 322 lb 3.2 oz (146.149 kg)  07/10/13 313 lb (141.976 kg)  04/14/13 304 lb (137.893 kg)  04/04/13 306 lb (138.801 kg)  04/03/13 309 lb (140.161 kg)  03/25/13 301 lb (136.533 kg)  01/28/13 297 lb (134.718 kg)    BMI:  Body mass index is 42.16 kg/(m^2).  Estimated Nutritional Needs:  Kcal:  1900-2100  Protein:  115-125 grams  Fluid:  2.5 L/day  Skin:  Reviewed, no issues  Diet Order:  Diet full liquid Room service appropriate?: Yes; Fluid consistency:: Thin  EDUCATION NEEDS:  No education needs identified at this time   Intake/Output Summary (Last 24 hours) at 12/03/14 1123 Last data filed at 12/03/14 0600  Gross per 24 hour  Intake 1318.75 ml  Output      0 ml  Net 1318.75  ml    Last BM:  6/9   Trenton Gammon, RD, LDN Inpatient Clinical Dietitian Pager # [phone] After hours/weekend pager # [phone]

## 2014-12-03 NOTE — Care Management Note (Signed)
Case Management Note  Patient Details  Name: Scott Holden MRN: [id number] Date of Birth: 14-Sep-1955  Subjective/Objective:    Pt admitted with cco weakness, found to have UTI                Action/Plan:from home, plan to return home   Expected Discharge Date:                  Expected Discharge Plan:  Home/Self Care  In-House Referral:     Discharge planning Services  CM Consult  Post Acute Care Choice:    Choice offered to:     DME Arranged:    DME Agency:     HH Arranged:    HH Agency:     Status of Service:     Medicare Important Message Given:    Date Medicare IM Given:    Medicare IM give by:    Date Additional Medicare IM Given:    Additional Medicare Important Message give by:     If discussed at Long Length of Stay Meetings, dates discussed:    Additional CommentsGeni Bers, RN 12/03/2014, 3:42 PM

## 2014-12-03 NOTE — Evaluation (Signed)
Physical Therapy Evaluation Patient Details Name: Scott Holden MRN: [id number] DOB: [birth year] Today's Date: 12/03/2014   History of Present Illness  Scott Holden is a 59 y.o. male with PMH of hypertension, diabetes mellitus, GERD, TIA, arthritis, chronic back pain, BPH, seizure, possible mild sarcoidosis, OSA, recurrent kidney stone, chronic brhonchitis, tobacco abuse, who presents with generalized weakness, dizziness, diarrhea, abdominal pain, dysuria.  Clinical Impression  On eval, pt required Min assist for mobility-able to walk several short distances (longest being 85 feet) before requiring seated rest break. Pt reports feeling sensations (tingling of top of head, heavy eyelids) similar to ones that occurred before he passed out at home. No LOC during this session however activity was fairly limited. Discussed d/c plan-pt states he plans to return home. Pt's son lives with him and has being assisting as needed. Pt appears very aware of when sensations begin and realizes need to sit down for safety. Instructed pt to continue this and to limit activity as needed for safety reasons. If condition persists, pt may need to consider getting wheelchair and mobilizing at wheelchair level.     Follow Up Recommendations Home health PT;Supervision for mobility/OOB (pt states he will return home at discharge)    Equipment Recommendations  None recommended by PT (may need wheelchair if condition persists)    Recommendations for Other Services       Precautions / Restrictions Precautions Precautions: Fall Restrictions Weight Bearing Restrictions: No      Mobility  Bed Mobility Overal bed mobility: Independent             General bed mobility comments: pt OOB in recliner  Transfers Overall transfer level: Needs assistance Equipment used: None Transfers: Sit to/from Stand Sit to Stand: Min guard         General transfer comment: close guard for safety. Increased  time.  Ambulation/Gait Ambulation/Gait assistance: Min assist Ambulation Distance (Feet): 85 Feet (85'x1, 65'x1, 50'x1) Assistive device: Rolling walker (2 wheeled) Gait Pattern/deviations: Step-through pattern;Decreased stride length     General Gait Details: Unsteady with ambulation-Min assist to stabilize intermittently. Seated rest breaks needed frequently due to pt reporting "tingling top of head, eyelids heavy" after short distances of ambulation. Sensations similar to what he experienced prior to passing out at home.   Stairs            Wheelchair Mobility    Modified Rankin (Stroke Patients Only)       Balance Overall balance assessment: Needs assistance         Standing balance support: No upper extremity supported;During functional activity Standing balance-Leahy Scale: Good                               Pertinent Vitals/Pain Pain Assessment: Faces Pain Score: 6  Faces Pain Scale: Hurts little more Pain Location: back, R hip Pain Descriptors / Indicators: Sore Pain Intervention(s): Monitored during session;Repositioned    Home Living Family/patient expects to be discharged to:: Private residence Living Arrangements: Children Available Help at Discharge: Family Type of Home: House Home Access: Stairs to enter   Secretary/administrator of Steps: 4 Home Layout: Two level Home Equipment: Environmental consultant - 2 wheels;Cane - single point      Prior Function Level of Independence: Independent               Hand Dominance        Extremity/Trunk Assessment   Upper Extremity Assessment: Defer  to OT evaluation           Lower Extremity Assessment: Generalized weakness;RLE deficits/detail RLE Deficits / Details: chronic R hip/LE weakness, debility    Cervical / Trunk Assessment: Normal  Communication   Communication: No difficulties  Cognition Arousal/Alertness: Awake/alert Behavior During Therapy: WFL for tasks  assessed/performed Overall Cognitive Status: Within Functional Limits for tasks assessed                      General Comments      Exercises        Assessment/Plan    PT Assessment Patient needs continued PT services  PT Diagnosis Difficulty walking;Generalized weakness   PT Problem List Decreased strength;Decreased activity tolerance;Decreased balance;Obesity;Decreased knowledge of use of DME;Pain  PT Treatment Interventions DME instruction;Gait training;Functional mobility training;Therapeutic activities;Therapeutic exercise;Patient/family education;Balance training   PT Goals (Current goals can be found in the Care Plan section) Acute Rehab PT Goals Patient Stated Goal: to be able to perform normal activities PT Goal Formulation: With patient Time For Goal Achievement: 12/17/14 Potential to Achieve Goals: Fair    Frequency Min 3X/week   Barriers to discharge        Co-evaluation               End of Session Equipment Utilized During Treatment: Gait belt Activity Tolerance: Other (comment) (Limited by tingling, heaviness feeling/dizziness) Patient left: in chair;with call bell/phone within reach;with family/visitor present           Time: 1610-9604 PT Time Calculation (min) (ACUTE ONLY): 23 min   Charges:   PT Evaluation $Initial PT Evaluation Tier I: 1 Procedure PT Treatments $Gait Training: 8-22 mins   PT G Codes:        Rebeca Alert, MPT Pager: [phone]

## 2014-12-03 NOTE — Evaluation (Signed)
Occupational Therapy Evaluation Patient Details Name: Scott Holden MRN: [id number] DOB: 06/01/1956 Today's Date: 12/03/2014    History of Present Illness Scott Holden is a 59 y.o. male with PMH of hypertension, diabetes mellitus, GERD, TIA, arthritis, chronic back pain, BPH, seizure, possible mild sarcoidosis, OSA, recurrent kidney stone, chronic brhonchitis, tobacco abuse, who presents with generalized weakness, dizziness, diarrhea, abdominal pain, dysuria.   Clinical Impression   Pt admitted with weakness. Pt currently with functional limitations due to the deficits listed below (see OT Problem List).  Pt will benefit from skilled OT to increase their safety and independence with ADL and functional mobility for ADL to facilitate discharge to venue listed below.     Follow Up Recommendations  No OT follow up    Equipment Recommendations  None recommended by OT    Recommendations for Other Services       Precautions / Restrictions Precautions Precautions: Fall Restrictions Weight Bearing Restrictions: No      Mobility Bed Mobility Overal bed mobility: Independent                Transfers Overall transfer level: Needs assistance Equipment used: 1 person hand held assist Transfers: Sit to/from Stand Sit to Stand: Min assist                   ADL Overall ADL's : Needs assistance/impaired Eating/Feeding: Supervision/ safety;Sitting   Grooming: Wash/dry face;Set up   Upper Body Bathing: Set up;Sitting   Lower Body Bathing: Minimal assistance;Sit to/from stand   Upper Body Dressing : Set up;Sitting   Lower Body Dressing: Minimal assistance;Sit to/from stand   Toilet Transfer: Minimal assistance Toilet Transfer Details (indicate cue type and reason): sit to stand for urinal Toileting- Clothing Manipulation and Hygiene: Minimal assistance;Sit to/from stand       Functional mobility during ADLs: Minimal assistance                 Pertinent  Vitals/Pain Pain Assessment: 0-10 Pain Score: 6  Pain Location: back Pain Descriptors / Indicators: Sore Pain Intervention(s): Monitored during session;Limited activity within patient's tolerance;Repositioned     Hand Dominance     Extremity/Trunk Assessment Upper Extremity Assessment Upper Extremity Assessment: Generalized weakness           Communication Communication Communication: No difficulties   Cognition Arousal/Alertness: Awake/alert Behavior During Therapy: WFL for tasks assessed/performed Overall Cognitive Status: Within Functional Limits for tasks assessed                     General Comments       Exercises       Shoulder Instructions      Home Living Family/patient expects to be discharged to:: Private residence Living Arrangements: Children Available Help at Discharge: Family Type of Home: House Home Access: Stairs to enter Secretary/administrator of Steps: 4   Home Layout: Two level     Bathroom Shower/Tub: Chief Strategy Officer: Standard     Home Equipment: Environmental consultant - 2 wheels;Cane - single point          Prior Functioning/Environment Level of Independence: Independent             OT Diagnosis: Generalized weakness;Acute pain   OT Problem List: Decreased strength;Decreased activity tolerance   OT Treatment/Interventions: Self-care/ADL training;DME and/or AE instruction;Patient/family education    OT Goals(Current goals can be found in the care plan section) Acute Rehab OT Goals Patient Stated Goal: doing my normal- piano  and computer OT Goal Formulation: With patient Time For Goal Achievement: 12/17/14 Potential to Achieve Goals: Good  OT Frequency: Min 2X/week   Barriers to D/C:            Co-evaluation              End of Session Nurse Communication: Mobility status  Activity Tolerance: Patient tolerated treatment well Patient left: in chair;with call bell/phone within reach   Time:  1215-1228 OT Time Calculation (min): 13 min Charges:  OT General Charges $OT Visit: 1 Procedure OT Treatments $Self Care/Home Management : 8-22 mins G-Codes:    Einar Crow D 12/16/2014, 12:30 PM

## 2014-12-03 NOTE — Progress Notes (Signed)
ANTIBIOTIC CONSULT NOTE - INITIAL  Pharmacy Consult for Cipro Indication:UTI, ? Intraabdominal infection   No Known Allergies  Patient Measurements:   Adjusted Body Weight: 97.4kg  Vital Signs: Temp: 98.1 F (36.7 C) (06/08 2021) Temp Source: Oral (06/08 2021) BP: 119/86 mmHg (06/09 0000) Pulse Rate: 92 (06/09 0000) Intake/Output from previous day:   Intake/Output from this shift:    Labs:  Recent Labs  12/02/14 2059 12/02/14 2100  WBC  --  15.3*  HGB  --  16.6  PLT  --  299  CREATININE 1.25*  --    CrCl cannot be calculated (Unknown ideal weight.). No results for input(s): VANCOTROUGH, VANCOPEAK, VANCORANDOM, GENTTROUGH, GENTPEAK, GENTRANDOM, TOBRATROUGH, TOBRAPEAK, TOBRARND, AMIKACINPEAK, AMIKACINTROU, AMIKACIN in the last 72 hours.   Microbiology: Recent Results (from the past 720 hour(s))  Urine culture     Status: None   Collection Time: 11/09/14  5:30 PM  Result Value Ref Range Status   Specimen Description URINE, CATHETERIZED  Final   Special Requests NONE  Final   Colony Count NO GROWTH Performed at [facility]   Final   Culture NO GROWTH Performed at [facility]   Final   Report Status 11/10/2014 FINAL  Final    Medical History: Past Medical History  Diagnosis Date  . Kidney calculi   . Hypertension   . History of TIAs   . Arthritis   . Back pain     trouble with lumbar 2, 3, and 4 - degenerative disease per pt  . Unspecified transient cerebral ischemia   . HA (headache)   . Syncope and collapse   . BPH (benign prostatic hyperplasia)   . GERD (gastroesophageal reflux disease)   . Diabetes mellitus without complication   . Seizure   . BPH (benign prostatic hyperplasia)   . Sarcoidosis of lung   . Tobacco abuse     Medications:  Scheduled:   Infusions:  . sodium chloride    . ciprofloxacin    . sodium chloride     PRN:  Assessment: 59 yo M with weakness and dizziness x 1 week Patient had near syncopal  event  Goal of Therapy:  Eradication of infection Plan:  Begin Cipro 400mg  IV q 12 hours. Follow up culture results  Loletta Specter 12/03/2014,12:35 AM

## 2014-12-04 LAB — GLUCOSE, CAPILLARY
GLUCOSE-CAPILLARY: 91 mg/dL (ref 65–99)
Glucose-Capillary: 86 mg/dL (ref 65–99)
Glucose-Capillary: 90 mg/dL (ref 65–99)
Glucose-Capillary: 144 mg/dL — ABNORMAL HIGH (ref 65–99)

## 2014-12-04 LAB — CBC
HEMATOCRIT: 40.6 % (ref 39.0–52.0)
Hemoglobin: 13.3 g/dL (ref 13.0–17.0)
MCH: 29.8 pg (ref 26.0–34.0)
MCHC: 32.8 g/dL (ref 30.0–36.0)
MCV: 90.8 fL (ref 78.0–100.0)
Platelets: 250 10*3/uL (ref 150–400)
RBC: 4.47 MIL/uL (ref 4.22–5.81)
RDW: 14.2 % (ref 11.5–15.5)
WBC: 11 10*3/uL — ABNORMAL HIGH (ref 4.0–10.5)

## 2014-12-04 LAB — BASIC METABOLIC PANEL
Anion gap: 7 (ref 5–15)
BUN: 10 mg/dL (ref 6–20)
CALCIUM: 8.3 mg/dL — AB (ref 8.9–10.3)
CHLORIDE: 105 mmol/L (ref 101–111)
CO2: 25 mmol/L (ref 22–32)
Creatinine, Ser: 0.92 mg/dL (ref 0.61–1.24)
GFR calc Af Amer: >60 mL/min (ref >60)
GFR calc non Af Amer: >60 mL/min (ref >60)
Glucose, Bld: 103 mg/dL — ABNORMAL HIGH (ref 65–99)
Potassium: 3.8 mmol/L (ref 3.5–5.1)
Sodium: 137 mmol/L (ref 135–145)

## 2014-12-04 LAB — CREATININE, URINE, RANDOM: CREATININE, URINE: 341.2 mg/dL

## 2014-12-04 LAB — UREA NITROGEN, URINE: UREA NITROGEN UR: 525 mg/dL

## 2014-12-04 MED ORDER — ONDANSETRON HCL 4 MG/2ML IJ SOLN
4.0000 mg | Freq: Four times a day (QID) | INTRAMUSCULAR | Status: DC | PRN
  Administered 2014-12-04 (×2): 4 mg via INTRAVENOUS
  Filled 2014-12-04 (×2): qty 2

## 2014-12-04 NOTE — Progress Notes (Signed)
ANTIBIOTIC CONSULT NOTE - follow up  Pharmacy Consult for Cipro Indication:UTI, ? Intraabdominal infection   No Known Allergies  Patient Measurements: Height: 5\' 10"  (177.8 cm) Weight: 293 lb 12.8 oz (133.267 kg) IBW/kg (Calculated) : 73 Adjusted Body Weight: 97.4kg  Vital Signs: Temp: 97.8 F (36.6 C) (06/10 0433) Temp Source: Oral (06/10 0433) BP: 120/66 mmHg (06/10 0433) Pulse Rate: 68 (06/10 0433) Intake/Output from previous day: 06/09 0701 - 06/10 0700 In: 4720 [P.O.:1220; I.V.:3000; IV Piggyback:500] Out: 3050 [Urine:3050] Intake/Output from this shift:    Labs:  Recent Labs  12/02/14 2059 12/02/14 2100 12/03/14 0204 12/03/14 0428 12/04/14 0512  WBC  --  15.3*  --  15.5* 11.0*  HGB  --  16.6  --  13.3 13.3  PLT  --  299  --  260 250  LABCREA  --   --  341.2  --   --   CREATININE 1.25*  --   --  1.14 0.92   Estimated Creatinine Clearance: 118.7 mL/min (by C-G formula based on Cr of 0.92). No results for input(s): VANCOTROUGH, VANCOPEAK, VANCORANDOM, GENTTROUGH, GENTPEAK, GENTRANDOM, TOBRATROUGH, TOBRAPEAK, TOBRARND, AMIKACINPEAK, AMIKACINTROU, AMIKACIN in the last 72 hours.   Microbiology: Recent Results (from the past 720 hour(s))  Urine culture     Status: None   Collection Time: 11/09/14  5:30 PM  Result Value Ref Range Status   Specimen Description URINE, CATHETERIZED  Final   Special Requests NONE  Final   Colony Count NO GROWTH Performed at [facility]   Final   Culture NO GROWTH Performed at [facility]   Final   Report Status 11/10/2014 FINAL  Final  Culture, blood (x 2)     Status: None (Preliminary result)   Collection Time: 12/03/14  1:20 AM  Result Value Ref Range Status   Specimen Description RIGHT ANTECUBITAL  Final   Special Requests BOTTLES DRAWN AEROBIC AND ANAEROBIC  Final   Culture   Final           BLOOD CULTURE RECEIVED NO GROWTH TO DATE CULTURE WILL BE HELD FOR 5 DAYS BEFORE ISSUING A FINAL NEGATIVE  REPORT Performed at [facility]    Report Status PENDING  Incomplete  Culture, blood (x 2)     Status: None (Preliminary result)   Collection Time: 12/03/14  1:25 AM  Result Value Ref Range Status   Specimen Description LEFT ANTECUBITAL  Final   Special Requests BOTTLES DRAWN AEROBIC AND ANAEROBIC  Final   Culture   Final           BLOOD CULTURE RECEIVED NO GROWTH TO DATE CULTURE WILL BE HELD FOR 5 DAYS BEFORE ISSUING A FINAL NEGATIVE REPORT Performed at [facility]    Report Status PENDING  Incomplete    Medical History: Past Medical History  Diagnosis Date  . Kidney calculi   . Hypertension   . History of TIAs   . Arthritis   . Back pain     trouble with lumbar 2, 3, and 4 - degenerative disease per pt  . Unspecified transient cerebral ischemia   . HA (headache)   . Syncope and collapse   . BPH (benign prostatic hyperplasia)   . GERD (gastroesophageal reflux disease)   . Diabetes mellitus without complication   . Seizure   . BPH (benign prostatic hyperplasia)   . Sarcoidosis of lung   . Tobacco abuse     Medications:  Scheduled:  . antiseptic oral rinse  7 mL Mouth Rinse q12n4p  . chlorhexidine  15 mL Mouth Rinse BID  . ciprofloxacin  400 mg Intravenous Q12H  . diltiazem  180 mg Oral q morning - 10a  . dipyridamole-aspirin  1 capsule Oral BID  . famotidine (PEPCID) IV  20 mg Intravenous Q12H  . feeding supplement (RESOURCE BREEZE)  1 Container Oral BID BM  . heparin  5,000 Units Subcutaneous 3 times per day  . insulin aspart  0-9 Units Subcutaneous TID WC  . lamoTRIgine  100 mg Oral BID  . loratadine  10 mg Oral q morning - 10a  . nicotine  14 mg Transdermal Daily  . sodium chloride  3 mL Intravenous Q12H   Infusions:  . sodium chloride 125 mL/hr at 12/04/14 0411    Assessment: 59 yo M with weakness and dizziness x 1 week.  Urinalysis showed trace amount of leukocytes. He has dysuria and burning on urination.  Pharmacy consulted to dose  cipro for uti.  -no urine culture obtained -Scr 0.92, CrCl >152mls/min  Goal of Therapy:  Eradication of infection cipro per renal function  Plan:  -continue Cipro  IV q 12 hours. -pharmacy will follow at a distance as current dose is appropriate for indication and renal function  Arley Phenix RPh 12/04/2014, 7:45 AM Pager [phone]

## 2014-12-04 NOTE — Progress Notes (Signed)
[facility] PROGRESS NOTE  Scott Holden ZOX:[id number] DOB: [birth year] DOA: 12/02/2014 PCP: Felton Clinton, MD  Assessment/Plan: Generalized weakness: Etiology is not clear. He has chronic right leg weakness, which is due to right hip problem, and has not changed per patient. No signs of stroke. It is likely due to deconditioning 2/2 multiple comorbidities Agree that his worsening weakness is likely due to ongoing diarrhea and possible UTI. - obtain PT evaluation  Abdominal pain, diarrhea and sepsis: Likely due to viral infection, but need to rule out C. difficile colitis since patient has history of antibiotic use. Patient is septic on admission with leukocytosis and tachycardia. Had fever at home reportedly.  - c diff PCR and GI pathogen panel pending -CT-abdomen/pelvis reviewed and reporting multiple nonobstructive calculi in the collecting systems of the kidneys bilaterally, normal appendix  Possible UTI: Urinalysis showed trace amount of leukocytes. He has dysuria and burning on urination. - Continue ciprofloxacin patient currently improving on this regimen - No urine culture obtained and if obtained now results would be skewed as patient has been started on Antibiotics.  AKI: likely due to prerenal secondary to dehydration and continuation of ARB, diruetics -IVF as above -Check FeUrea -CT abdomen and pelvis reviewed -Hold losartan and HCTZ -Follow up renal function by BMP  Transient cerebral ischemia: -Continue Aggrenox  Abnormal EKG: Patient has a new bifascicular block, but no chest pain. -Troponin 3  -repeat EKG morning  DM-II: Last A1c 6.3 on 01/14/13. well controled. Patient is taking metformin at home -SSI -Check A1c  Chronic bronchitis: Stable, no signs of acute exacerbation. Chest x-ray negative for acute abnormalities. -DuoNeb and albuterol when necessary -Mucinex for cough  Seizure:  -stable continue home regimen.  GERD: -pepcide, stable  HTN:   -Hold losartan and HCTZ due to acute renal injury -IV hydralazine when necessary -continue current dose of Cardizem  Code Status: Full Family Communication: Discussed directly with patient Disposition Plan: discharge most likely next am with continued improvement.   Consultants:  None  Procedures:  None  Antibiotics:  cipro  HPI/Subjective: Patient feels better at difficulty with ambulation.  Objective: Filed Vitals:   12/04/14 1301  BP: 135/66  Pulse: 60  Temp: 97.7 F (36.5 C)  Resp: 16    Intake/Output Summary (Last 24 hours) at 12/04/14 1645 Last data filed at 12/04/14 1301  Gross per 24 hour  Intake   3980 ml  Output   2825 ml  Net   1155 ml   Filed Weights   12/03/14 0050  Weight: 133.267 kg (293 lb 12.8 oz)    Exam:   General:  Pt in nad, alert and awake  Cardiovascular: rrr, no mrg  Respiratory: no increased wob, no wheezes  Abdomen: soft, obese, no guarding  Musculoskeletal: no cyanosis or clubbing  Data Reviewed: Basic Metabolic Panel:  Recent Labs Lab 12/02/14 2059 12/03/14 0428 12/04/14 0512  NA 137 140 137  K 3.9 3.8 3.8  CL 102 106 105  CO2 GLUCOSE 170* 84 103*  BUN CREATININE 1.25* 1.14 0.92  CALCIUM 9.9 8.8* 8.3*   Liver Function Tests:  Recent Labs Lab 12/02/14 2059  AST 22  ALT 22  ALKPHOS 93  BILITOT 0.7  PROT 7.5  ALBUMIN 3.9    Recent Labs Lab 12/03/14 0600  LIPASE 18*   No results for input(s): AMMONIA in the last 168 hours. CBC:  Recent Labs Lab 12/02/14 2100 12/03/14 0428 12/04/14 0512  WBC  15.3* 15.5* 11.0*  HGB 16.6 13.3 13.3  HCT 49.5 41.9 40.6  MCV 89.0 89.1 90.8  PLT 299 260 250   Cardiac Enzymes:  Recent Labs Lab 12/02/14 2059 12/03/14 0120 12/03/14 0645 12/03/14 1225  TROPONINI <0.03 <0.03 <0.03 <0.03   BNP (last 3 results) No results for input(s): BNP in the last 8760 hours.  ProBNP (last 3 results) No results for input(s): PROBNP in the last  8760 hours.  CBG:  Recent Labs Lab 12/03/14 1208 12/03/14 1605 12/03/14 2037 12/04/14 0732 12/04/14 1259  GLUCAP 137* 154* 102* 86 90    Recent Results (from the past 240 hour(s))  Culture, blood (x 2)     Status: None (Preliminary result)   Collection Time: 12/03/14  1:20 AM  Result Value Ref Range Status   Specimen Description RIGHT ANTECUBITAL  Final   Special Requests BOTTLES DRAWN AEROBIC AND ANAEROBIC  Final   Culture   Final           BLOOD CULTURE RECEIVED NO GROWTH TO DATE CULTURE WILL BE HELD FOR 5 DAYS BEFORE ISSUING A FINAL NEGATIVE REPORT Performed at [facility]    Report Status PENDING  Incomplete  Culture, blood (x 2)     Status: None (Preliminary result)   Collection Time: 12/03/14  1:25 AM  Result Value Ref Range Status   Specimen Description LEFT ANTECUBITAL  Final   Special Requests BOTTLES DRAWN AEROBIC AND ANAEROBIC  Final   Culture   Final           BLOOD CULTURE RECEIVED NO GROWTH TO DATE CULTURE WILL BE HELD FOR 5 DAYS BEFORE ISSUING A FINAL NEGATIVE REPORT Performed at [facility]    Report Status PENDING  Incomplete     Studies: Ct Abdomen Pelvis Wo Contrast  12/03/2014   CLINICAL DATA:  59 year old male with right lower quadrant and right-sided flank pain for 1 month. Dysuria. History of multiple stones. Prior hernia surgery.  EXAM: CT ABDOMEN AND PELVIS WITHOUT CONTRAST  TECHNIQUE: Multidetector CT imaging of the abdomen and pelvis was performed following the standard protocol without IV contrast.  COMPARISON:  CT of the abdomen and pelvis 11/09/2014.  FINDINGS: Lower chest: Atherosclerotic calcifications in the right coronary artery. Calcifications of the aortic valve.  Hepatobiliary: No definite cystic or solid hepatic lesions are identified on today's noncontrast CT examination. The unenhanced appearance of the gallbladder is normal.  Pancreas: No definite pancreatic mass or peripancreatic inflammatory changes on today's  noncontrast CT examination.  Spleen: Unremarkable.  Adrenals/Urinary Tract: Multiple small nonobstructive calculi are present within the collecting systems of the kidneys bilaterally, largest of which measures 7 mm in the interpolar collecting system of the right kidney. No ureteral stones or bladder stones. No hydroureteronephrosis to indicate urinary tract obstruction at this time. The unenhanced appearance of the urinary bladder is normal. Bilateral adrenal glands are normal in appearance.  Stomach/Bowel: The unenhanced appearance of the stomach is normal. No pathologic dilatation of small bowel or colon. Normal appendix.  Vascular/Lymphatic: Atherosclerotic calcifications throughout the abdominal and pelvic vasculature, without definite aneurysm. No lymphadenopathy is noted in the abdomen or pelvis on today's noncontrast CT examination. Multiple prominent borderline enlarged right-sided pelvic lymph nodes are noted, but are nonspecific, measuring up to 9 mm in short axis in the right external iliac nodal stations.  Reproductive: Prostate gland appears heterogeneous and has multiple internal calcifications (nonspecific). Seminal vesicles are normal in appearance.  Other: No significant volume of ascites.  No  pneumoperitoneum.  Musculoskeletal: Status post right total hip arthroplasty. There are no aggressive appearing lytic or blastic lesions noted in the visualized portions of the skeleton.  IMPRESSION: 1. Multiple nonobstructive calculi in the collecting systems of the kidneys bilaterally (right greater than left), with the largest of these measuring 7 mm in the interpolar collecting system of the right kidney. No ureteral stones or findings of urinary tract obstruction are noted at this time. 2. Normal appendix. 3. Atherosclerosis, including right coronary artery disease. Please note that although the presence of coronary artery calcium documents the presence of coronary artery disease, the severity of this  disease and any potential stenosis cannot be assessed on this non-gated CT examination. Assessment for potential risk factor modification, dietary therapy or pharmacologic therapy may be warranted, if clinically indicated. 4. Additional incidental findings, as above.   Electronically Signed   By: Trudie Reed M.D.   On: 12/03/2014 08:23   Dg Chest 2 View  12/02/2014   CLINICAL DATA:  59 year old male with weakness and dizziness for the past 4 days.  EXAM: CHEST  2 VIEW  COMPARISON:  Chest x-ray 02/16/2013.  FINDINGS: Lung volumes are normal. No consolidative airspace disease. No pleural effusions. No pneumothorax. No pulmonary nodule or mass noted. Pulmonary vasculature and the cardiomediastinal silhouette are within normal limits.  IMPRESSION: No radiographic evidence of acute cardiopulmonary disease.   Electronically Signed   By: Trudie Reed M.D.   On: 12/02/2014 21:11   Ct Head Wo Contrast  12/02/2014   CLINICAL DATA:  59 year old male with weakness and dizziness for the past 5 days. Near syncopal event 3 days ago.  EXAM: CT HEAD WITHOUT CONTRAST  TECHNIQUE: Contiguous axial images were obtained from the base of the skull through the vertex without intravenous contrast.  COMPARISON:  Head CT 04/26/2012.  FINDINGS: Patchy and confluent areas of decreased attenuation are noted throughout the deep and periventricular white matter of the cerebral hemispheres bilaterally, compatible with chronic microvascular ischemic disease. Well-defined areas of low attenuation in the cerebral hemispheres bilaterally, compatible with old lacunar infarcts. No acute intracranial abnormalities. Specifically, no evidence of acute intracranial hemorrhage, no definite findings of acute/subacute cerebral ischemia, no mass, mass effect, hydrocephalus or abnormal intra or extra-axial fluid collections. Visualized paranasal sinuses and mastoids are well pneumatized. No acute displaced skull fractures are identified.  IMPRESSION:  1. No acute intracranial abnormalities. 2. 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 3. Extensive chronic microvascular ischemic changes in the cerebral white matter, increased compared to prior examination 04/26/2012.   Electronically Signed   By: Trudie Reed M.D.   On: 12/02/2014 23:00    Scheduled Meds: . antiseptic oral rinse  7 mL Mouth Rinse q12n4p  . chlorhexidine  15 mL Mouth Rinse BID  . ciprofloxacin  400 mg Intravenous Q12H  . diltiazem  180 mg Oral q morning - 10a  . dipyridamole-aspirin  1 capsule Oral BID  . famotidine (PEPCID) IV  20 mg Intravenous Q12H  . feeding supplement (RESOURCE BREEZE)  1 Container Oral BID BM  . heparin  5,000 Units Subcutaneous 3 times per day  . insulin aspart  0-9 Units Subcutaneous TID WC  . lamoTRIgine  100 mg Oral BID  . loratadine  10 mg Oral q morning - 10a  . nicotine  14 mg Transdermal Daily  . sodium chloride  3 mL Intravenous Q12H   Continuous Infusions: . sodium chloride 125 mL/hr at 12/04/14 1328    Principal Problem:  Weakness Active Problems:   HYPERTENSION, BENIGN ESSENTIAL   Transient cerebral ischemia   Allergic rhinitis   RENAL CALCULUS   DEGENERATIVE DISC DISEASE, LUMBAR SPINE   HA (headache)   Chronic respiratory failure   Pulmonary sarcoidosis   Chronic bronchitis   Seizures   BPH (benign prostatic hyperplasia)   GERD (gastroesophageal reflux disease)   Diabetes mellitus without complication   Dizziness   Generalized weakness   Leukocytosis   Tobacco abuse   Abdominal pain   Diarrhea   Dysuria   AKI (acute kidney injury)   Sepsis   UTI (lower urinary tract infection)   Malnutrition of moderate degree    Time spent: > 35 minutes    Penny Pia  [facility] Pager [phone] If 7PM-7AM, please contact night-coverage at [email or web address], password [facility] 12/04/2014, 4:45 PM  LOS: 2 days

## 2014-12-04 NOTE — Progress Notes (Signed)
Per infection prevention enteric precautions could be discontinued if MD discontinued gi panel and c-diff stool sample collection.  Orders discontinued and enteric precautions removed.  Pt has not had loose stool since Monday.

## 2014-12-04 NOTE — Progress Notes (Signed)
Physical Therapy Treatment Patient Details Name: Scott Holden MRN: [id number] DOB: 04-20-1956 Today's Date: 12/04/2014    History of Present Illness Scott Holden is a 59 y.o. male with PMH of hypertension, diabetes mellitus, GERD, TIA, arthritis, chronic back pain, BPH, seizure, possible mild sarcoidosis, OSA, recurrent kidney stone, chronic brhonchitis, tobacco abuse, who presents with generalized weakness, dizziness, diarrhea, abdominal pain, dysuria.    PT Comments    Pt reports he only slept 1 hour last night so he has increased fatigue but was agreeable to attempt PT.  Started with just standing balance activities with min A, able to gait 1 x 65' with increased R hip and back pain.  Pt will continue to benefit from PT services  Follow Up Recommendations  Home health PT;Supervision for mobility/OOB     Equipment Recommendations  None recommended by PT    Recommendations for Other Services       Precautions / Restrictions Precautions Precautions: Fall Restrictions Weight Bearing Restrictions: No    Mobility  Bed Mobility Overal bed mobility: Independent                Transfers   Equipment used: 1 person hand held assist   Sit to Stand: Min guard         General transfer comment: close guard for safety, pt reports feeling "woozy" with sitting, standing and lying today  Ambulation/Gait Ambulation/Gait assistance: Min assist Ambulation Distance (Feet): 65 Feet Assistive device: Rolling walker (2 wheeled) Gait Pattern/deviations: Step-to pattern;Decreased stride length   Gait velocity interpretation: Below normal speed for age/gender General Gait Details: pt with increased fatigue today, only able to perform 1 x gait training, pt walking on toe of R LE due to hip and back pain, requires frequent standing rest breaks due to fatigue   Stairs            Wheelchair Mobility    Modified Rankin (Stroke Patients Only)       Balance Overall balance  assessment: Needs assistance             Standing balance comment: static standing balance with head turns and reachign with min guard due to pt feeling "more unsteady" today                    Cognition Arousal/Alertness: Awake/alert Behavior During Therapy: WFL for tasks assessed/performed Overall Cognitive Status: Within Functional Limits for tasks assessed                      Exercises      General Comments        Pertinent Vitals/Pain Pain Score: 5  Pain Location: back Pain Descriptors / Indicators: Aching;Sore Pain Intervention(s): Premedicated before session;Limited activity within patient's tolerance    Home Living                      Prior Function            PT Goals (current goals can now be found in the care plan section) Progress towards PT goals: Progressing toward goals    Frequency       PT Plan Current plan remains appropriate    Co-evaluation             End of Session Equipment Utilized During Treatment: Gait belt Activity Tolerance: Patient limited by fatigue;Patient limited by pain Patient left: with call bell/phone within reach;with family/visitor present;in bed     Time:  1610-9604 PT Time Calculation (min) (ACUTE ONLY): 23 min  Charges:  $Gait Training: 8-22 mins $Therapeutic Activity: 8-22 mins                    G Codes:      DONAWERTH,KAREN 2014-12-14, 10:49 AM

## 2014-12-05 LAB — GLUCOSE, CAPILLARY: Glucose-Capillary: 85 mg/dL (ref 65–99)

## 2014-12-05 MED ORDER — HYDROCODONE-ACETAMINOPHEN 5-325 MG PO TABS
1.0000 | ORAL_TABLET | Freq: Four times a day (QID) | ORAL | Status: DC | PRN
  Administered 2014-12-05: 2 via ORAL
  Filled 2014-12-05: qty 2

## 2014-12-05 MED ORDER — CIPROFLOXACIN HCL 500 MG PO TABS: 500.0000 mg | ORAL_TABLET | Freq: Two times a day (BID) | ORAL | Status: DC

## 2014-12-05 MED ORDER — HYDROCODONE-ACETAMINOPHEN 5-325 MG PO TABS: 1.0000 | ORAL_TABLET | Freq: Four times a day (QID) | ORAL | Status: AC | PRN

## 2014-12-05 MED ORDER — DILTIAZEM HCL ER COATED BEADS 180 MG PO CP24: 180.0000 mg | ORAL_CAPSULE | Freq: Every morning | ORAL | Status: AC

## 2014-12-05 NOTE — Care Management Note (Signed)
Case Management Note  Patient Details  Name: Scott Holden MRN: [id number] Date of Birth: August 11, 1955  Subjective/Objective:     UTI               Action/Plan: Discharge to home  Expected Discharge Date:     12/05/2014             Expected Discharge Plan:  Home/Self Care  In-House Referral:     Discharge planning Services  CM Consult     HH Arranged:  Patient Refused [facility] Agency:     Status of Service:  Completed, signed off  Medicare Important Message Given:  No Date Medicare IM Given:    Medicare IM give by:    Date Additional Medicare IM Given:    Additional Medicare Important Message give by:     If discussed at Long Length of Stay Meetings, dates discussed:    Additional Comments: Consult for [facility]. Pt was arranged with [facility] for [facility]. Pt refusing HH at this time. Contacted [facility] to make aware.  Elliot Cousin, RN 12/05/2014, 12:07 PM

## 2014-12-05 NOTE — Discharge Summary (Signed)
Physician Discharge Summary  Scott Holden:[id number] DOB: 03/27/1956 DOA: 12/02/2014  PCP: Felton Clinton, MD  Admit date: 12/02/2014 Discharge date: 12/05/2014  Time spent: > 35 minutes  Recommendations for Outpatient Follow-up:  1. Please monitor wbc 2. Patient had kidney stones and will need to f/u with urologist (S creatinine trending down) and reportedly none obstructing  Discharge Diagnoses:  Principal Problem:   Weakness Active Problems:   HYPERTENSION, BENIGN ESSENTIAL   Transient cerebral ischemia   Allergic rhinitis   RENAL CALCULUS   DEGENERATIVE DISC DISEASE, LUMBAR SPINE   HA (headache)   Chronic respiratory failure   Pulmonary sarcoidosis   Chronic bronchitis   Seizures   BPH (benign prostatic hyperplasia)   GERD (gastroesophageal reflux disease)   Diabetes mellitus without complication   Dizziness   Generalized weakness   Leukocytosis   Tobacco abuse   Abdominal pain   Diarrhea   Dysuria   AKI (acute kidney injury)   Sepsis   UTI (lower urinary tract infection)   Malnutrition of moderate degree   Discharge Condition: stable  Diet recommendation: low sodium  Filed Weights   12/03/14 0050  Weight: 133.267 kg (293 lb 12.8 oz)    History of present illness:  From original HPI: 59 y.o. male with PMH of hypertension, diabetes mellitus, GERD, TIA, arthritis, chronic back pain, BPH, seizure, possible mild sarcoidosis, OSA, recurrent kidney stone, chronic brhonchitis, tobacco abuse, who presents with generalized weakness, dizziness, diarrhea, abdominal pain, dysuria.  Patient reports that in the past 6 days, he has been having generalized weakness and dizziness. Sometimes he felt that he was going to pass out, but has not. He reports that he had right hip replacement long time ago, since then he has right leg weakness due to hip pain, which has not changed. Other than that, he does not have new unilateral weakness, numbness or tingling  sensations.  Hospital Course:  Generalized weakness: Etiology is not clear. He has chronic right leg weakness, which is due to right hip problem, and has not changed per patient. No signs of stroke. It is likely due to deconditioning 2/2 multiple comorbidities  - PT with HH when transitioned to home.  Abdominal pain, diarrhea and sepsis: Likely due to viral infection, but need to rule out C. difficile colitis since patient has history of antibiotic use. Patient is septic on admission with leukocytosis and tachycardia. Had fever at home reportedly.  - c diff PCR and GI pathogen panel pending -CT-abdomen/pelvis reviewed and reporting multiple nonobstructive calculi in the collecting systems of the kidneys bilaterally, normal appendix  Possible UTI: Urinalysis showed trace amount of leukocytes. He has dysuria and burning on urination. - Continue ciprofloxacin for 5 more days patient currently improving on this regimen. Will complete a 7 day treatment course - No urine culture obtained and if obtained now results would be skewed as patient has been started on Antibiotics.  AKI: likely due to prerenal secondary to dehydration and continuation of ARB, diruetics -resolved after holding ARB and hctz and with IVF's -Check FeUrea -CT abdomen and pelvis reviewed -Hold losartan and HCTZ on d/c  Transient cerebral ischemia: -Continue Aggrenox on d/c  Abnormal EKG: Patient has a new bifascicular block, but no chest pain. -Troponin 3  -repeat EKG morning  DM-II: Last A1c 6.3 on 01/14/13. well controled. Patient is taking metformin at home -Diabetic diet - metformin  Chronic bronchitis: Stable, no signs of acute exacerbation. Chest x-ray negative for acute abnormalities. -Albuterol when necessary -Mucinex  for cough  Seizure:  -stable continue home regimen on d/c.  GERD: -stable  HTN:  -Hold losartan and HCTZ due to recent acute renal injury -continue lower dose of Cardizem on  d/c   Procedures: None Consultations:  none  Discharge Exam: Filed Vitals:   12/05/14 0541  BP: 144/81  Pulse: 67  Temp: 97.8 F (36.6 C)  Resp: 18    General: pt in nad, alert and awake Cardiovascular: rrr, no mrg Respiratory: cta bl, no wheezes  Discharge Instructions   Discharge Instructions    Call MD for:  difficulty breathing, headache or visual disturbances    Complete by:  As directed      Call MD for:  redness, tenderness, or signs of infection (pain, swelling, redness, odor or green/yellow discharge around incision site)    Complete by:  As directed      Call MD for:  severe uncontrolled pain    Complete by:  As directed      Call MD for:  temperature >100.4    Complete by:  As directed      Diet - low sodium heart healthy    Complete by:  As directed      Discharge instructions    Complete by:  As directed   F/u with your urologist after hospital discharge for recommendations regarding kidney stones     Increase activity slowly    Complete by:  As directed           Current Discharge Medication List    CONTINUE these medications which have CHANGED   Details  ciprofloxacin (CIPRO) 500 MG tablet Take 1 tablet (500 mg total) by mouth 2 (two) times daily. Qty: 10 tablet, Refills: 0    diltiazem (CARDIZEM CD) 180 MG 24 hr capsule Take 1 capsule (180 mg total) by mouth every morning. Qty: 30 capsule, Refills: 0    HYDROcodone-acetaminophen (NORCO/VICODIN) 5-325 MG per tablet Take 1 tablet by mouth every 6 (six) hours as needed. Qty: 30 tablet, Refills: 0      CONTINUE these medications which have NOT CHANGED   Details  albuterol (PROVENTIL HFA;VENTOLIN HFA) 108 (90 BASE) MCG/ACT inhaler Inhale 1-2 puffs into the lungs every 6 (six) hours as needed for wheezing. Qty: 1 Inhaler, Refills: 0    dipyridamole-aspirin (AGGRENOX) 200-25 MG per 12 hr capsule Take 1 capsule by mouth 2 (two) times daily. Qty: 60 capsule, Refills: 6    ipratropium-albuterol  (DUONEB) 0.5-2.5 (3) MG/3ML SOLN Take 3 mLs by nebulization every 4 (four) hours as needed (For shortness of breath). Qty: 360 mL, Refills: 3    lamoTRIgine (LAMICTAL) 25 MG tablet TAKE 3 TABLETS BY MOUTH TWICE A DAY FOR 1 WEEK THEN TAKE 4 TABLETS BY MOUTH TWICE A DAY Qty: 240 tablet, Refills: 3    loratadine (CLARITIN) 10 MG tablet Take 10 mg by mouth every morning.    metFORMIN (GLUCOPHAGE) 500 MG tablet Take 1 tablet by mouth 2 (two) times daily. Refills: 4    omeprazole (PRILOSEC) 40 MG capsule Take 40 mg by mouth every morning.     traMADol (ULTRAM) 50 MG tablet Take 50 mg by mouth 3 (three) times daily as needed for moderate pain.    fluticasone (FLONASE) 50 MCG/ACT nasal spray Place 2 sprays into the nose daily. Qty: 16 g, Refills: 5   Associated Diagnoses: Obstructive sleep apnea; Nonspecific abnormal electrocardiogram (ECG) (EKG); Morbid obesity; Chronic nasal congestion    ondansetron (ZOFRAN ODT) 8 MG disintegrating tablet  Take 1 tablet (8 mg total) by mouth every 8 (eight) hours as needed for nausea. Qty: 20 tablet, Refills: 0    oxyCODONE (ROXICODONE) 5 MG immediate release tablet Take 1 tablet (5 mg total) by mouth every 6 (six) hours as needed for severe pain. Qty: 15 tablet, Refills: 0    tamsulosin (FLOMAX) 0.4 MG CAPS capsule Take 1 capsule (0.4 mg total) by mouth daily. Qty: 10 capsule, Refills: 0      STOP taking these medications     hydrochlorothiazide (HYDRODIURIL) 25 MG tablet      losartan (COZAAR) 50 MG tablet        No Known Allergies    The results of significant diagnostics from this hospitalization (including imaging, microbiology, ancillary and laboratory) are listed below for reference.    Significant Diagnostic Studies: Ct Abdomen Pelvis Wo Contrast  12/03/2014   CLINICAL DATA:  59 year old male with right lower quadrant and right-sided flank pain for 1 month. Dysuria. History of multiple stones. Prior hernia surgery.  EXAM: CT ABDOMEN AND  PELVIS WITHOUT CONTRAST  TECHNIQUE: Multidetector CT imaging of the abdomen and pelvis was performed following the standard protocol without IV contrast.  COMPARISON:  CT of the abdomen and pelvis 11/09/2014.  FINDINGS: Lower chest: Atherosclerotic calcifications in the right coronary artery. Calcifications of the aortic valve.  Hepatobiliary: No definite cystic or solid hepatic lesions are identified on today's noncontrast CT examination. The unenhanced appearance of the gallbladder is normal.  Pancreas: No definite pancreatic mass or peripancreatic inflammatory changes on today's noncontrast CT examination.  Spleen: Unremarkable.  Adrenals/Urinary Tract: Multiple small nonobstructive calculi are present within the collecting systems of the kidneys bilaterally, largest of which measures 7 mm in the interpolar collecting system of the right kidney. No ureteral stones or bladder stones. No hydroureteronephrosis to indicate urinary tract obstruction at this time. The unenhanced appearance of the urinary bladder is normal. Bilateral adrenal glands are normal in appearance.  Stomach/Bowel: The unenhanced appearance of the stomach is normal. No pathologic dilatation of small bowel or colon. Normal appendix.  Vascular/Lymphatic: Atherosclerotic calcifications throughout the abdominal and pelvic vasculature, without definite aneurysm. No lymphadenopathy is noted in the abdomen or pelvis on today's noncontrast CT examination. Multiple prominent borderline enlarged right-sided pelvic lymph nodes are noted, but are nonspecific, measuring up to 9 mm in short axis in the right external iliac nodal stations.  Reproductive: Prostate gland appears heterogeneous and has multiple internal calcifications (nonspecific). Seminal vesicles are normal in appearance.  Other: No significant volume of ascites.  No pneumoperitoneum.  Musculoskeletal: Status post right total hip arthroplasty. There are no aggressive appearing lytic or blastic  lesions noted in the visualized portions of the skeleton.  IMPRESSION: 1. Multiple nonobstructive calculi in the collecting systems of the kidneys bilaterally (right greater than left), with the largest of these measuring 7 mm in the interpolar collecting system of the right kidney. No ureteral stones or findings of urinary tract obstruction are noted at this time. 2. Normal appendix. 3. Atherosclerosis, including right coronary artery disease. Please note that although the presence of coronary artery calcium documents the presence of coronary artery disease, the severity of this disease and any potential stenosis cannot be assessed on this non-gated CT examination. Assessment for potential risk factor modification, dietary therapy or pharmacologic therapy may be warranted, if clinically indicated. 4. Additional incidental findings, as above.   Electronically Signed   By: Trudie Reed M.D.   On: 12/03/2014 08:23   Dg Chest  2 View  12/02/2014   CLINICAL DATA:  59 year old male with weakness and dizziness for the past 4 days.  EXAM: CHEST  2 VIEW  COMPARISON:  Chest x-ray 02/16/2013.  FINDINGS: Lung volumes are normal. No consolidative airspace disease. No pleural effusions. No pneumothorax. No pulmonary nodule or mass noted. Pulmonary vasculature and the cardiomediastinal silhouette are within normal limits.  IMPRESSION: No radiographic evidence of acute cardiopulmonary disease.   Electronically Signed   By: Trudie Reed M.D.   On: 12/02/2014 21:11   Ct Head Wo Contrast  12/02/2014   CLINICAL DATA:  59 year old male with weakness and dizziness for the past 5 days. Near syncopal event 3 days ago.  EXAM: CT HEAD WITHOUT CONTRAST  TECHNIQUE: Contiguous axial images were obtained from the base of the skull through the vertex without intravenous contrast.  COMPARISON:  Head CT 04/26/2012.  FINDINGS: Patchy and confluent areas of decreased attenuation are noted throughout the deep and periventricular white  matter of the cerebral hemispheres bilaterally, compatible with chronic microvascular ischemic disease. Well-defined areas of low attenuation in the cerebral hemispheres bilaterally, compatible with old lacunar infarcts. No acute intracranial abnormalities. Specifically, no evidence of acute intracranial hemorrhage, no definite findings of acute/subacute cerebral ischemia, no mass, mass effect, hydrocephalus or abnormal intra or extra-axial fluid collections. Visualized paranasal sinuses and mastoids are well pneumatized. No acute displaced skull fractures are identified.  IMPRESSION: 1. No acute intracranial abnormalities. 2. 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 3. Extensive chronic microvascular ischemic changes in the cerebral white matter, increased compared to prior examination 04/26/2012.   Electronically Signed   By: Trudie Reed M.D.   On: 12/02/2014 23:00   US Abdomen Complete  11/11/2014   CLINICAL DATA:  Abdominal pain.  Flank pain.  Hematuria.  EXAM: ULTRASOUND ABDOMEN COMPLETE  COMPARISON:  CT, 11/09/2014  FINDINGS: Gallbladder: No gallstones or wall thickening visualized. No sonographic Murphy sign noted.  Common bile duct: Diameter: 5.3 mm  Liver: Echogenic.  No mass or focal lesion.  IVC: No abnormality visualized.  Pancreas: Poorly visualized.  Spleen: Size and appearance within normal limits.  Right Kidney: Length: 13.0 cm. Echogenicity within normal limits. No mass or hydronephrosis visualized. Small stones noted on CT not resolved sonographically.  Left Kidney: Length: 13.3 cm. Echogenicity within normal limits. No mass or hydronephrosis visualized. Small stones noted on CT not resolved sonographically  Abdominal aorta: No aneurysm visualized.  Other findings: None.  IMPRESSION: 1. No acute findings. 2. No hydronephrosis. Kidneys are unremarkable sonographically. Small stones noted in the kidneys on the recent prior CT are not resolved sonographically.  3. Normal gallbladder.  No bile duct dilation.   Electronically Signed   By: Amie Portland M.D.   On: 11/11/2014 15:07   US Pelvis Limited  11/11/2014   CLINICAL DATA:  History of bladder stones.  EXAM: LIMITED ULTRASOUND OF PELVIS  TECHNIQUE: Limited transabdominal ultrasound examination of the pelvis was performed.  COMPARISON:  CT abdomen pelvis 11/09/2014.  FINDINGS: No echogenic calculi are seen in the bladder.  IMPRESSION: No echogenic calculi are seen in the bladder.   Electronically Signed   By: Leanna Battles M.D.   On: 11/11/2014 15:09   US Renal  11/06/2014   CLINICAL DATA:  Right flank pain.  History of nephrolithiasis.  EXAM: RENAL / URINARY TRACT ULTRASOUND COMPLETE  COMPARISON:  None.  FINDINGS: Right Kidney:  Length: 14.3 cm. Echogenicity within normal limits. No mass or hydronephrosis visualized. There is a  probable 0.7 cm nonobstructing stone in the midpole right kidney.  Left Kidney:  Length: 12.1 cm. Echogenicity within normal limits. No mass or hydronephrosis visualized.  Bladder:  There is debris within the bladder.  IMPRESSION: Probable nonobstructing stone in the right kidney. No hydronephrosis bilaterally.  Debris identified within the bladder.  This can be seen in cystitis.   Electronically Signed   By: Sherian Rein M.D.   On: 11/06/2014 21:25   Ct Renal Stone Study  11/09/2014   CLINICAL DATA:  59 year old male with right-sided flank pain and hematuria  EXAM: CT ABDOMEN AND PELVIS WITHOUT CONTRAST  TECHNIQUE: Multidetector CT imaging of the abdomen and pelvis was performed following the standard protocol without IV contrast.  COMPARISON:  Prior CT abdomen/ pelvis 12/09/2012  FINDINGS: Lower Chest: The lung bases are clear. Visualized cardiac structures are within normal limits for size. No pericardial effusion. Unremarkable visualized distal thoracic esophagus.  Abdomen: Unenhanced CT was performed per clinician order. Lack of IV contrast limits sensitivity and specificity,  especially for evaluation of abdominal/pelvic solid viscera. Within these limitations, unremarkable CT appearance of the stomach, duodenum, spleen, adrenal glands and pancreas save for fatty atrophy. Normal hepatic contour and morphology. No discrete hepatic lesion. Gallbladder is unremarkable. No intra or extrahepatic biliary ductal dilatation.  The bilateral nephrolithiasis. 5 mm stone noted in the interpolar collecting system on the right. Two punctate stones are present of the left kidney, 1 in the upper and 1 in the interpolar collecting system. Nonspecific perinephric stranding bilaterally. No hydronephrosis.  No evidence of obstruction or focal bowel wall thickening. Normal appendix in the right lower quadrant. The terminal ileum is unremarkable. No free fluid or suspicious adenopathy.  Pelvis: Although visualization is limited secondary to streak artifact from the right hip prosthesis, there is a punctate calcification in the region of the right UVJ. The bladder is otherwise decompressed. Dystrophic calcifications present centrally within the prostate gland.  Bones/Soft Tissues: Right hip arthroplasty without evidence of hardware complication in the visualized segment. No acute fracture or aggressive appearing lytic or blastic osseous lesion. Lower lumbar facet arthropathy.  Vascular: Limited evaluation in the absence of intravenous contrast. Atherosclerotic vascular disease without significant stenosis or aneurysmal dilatation.  IMPRESSION: 1. Small 1-2 mm stone in the right aspect of the bladder may be within the ureterovesicular junction, or recently passed into the bladder. There is no evidence of associated right hydro ureteral nephrosis. 2. Additional nonobstructing renal stones bilaterally. 3. Nonspecific bilateral perinephric stranding which is similar compared to prior imaging. Atherosclerotic vascular calcifications.   Electronically Signed   By: Malachy Moan M.D.   On: 11/09/2014 17:02     Microbiology: Recent Results (from the past 240 hour(s))  Culture, blood (x 2)     Status: None (Preliminary result)   Collection Time: 12/03/14  1:20 AM  Result Value Ref Range Status   Specimen Description RIGHT ANTECUBITAL  Final   Special Requests BOTTLES DRAWN AEROBIC AND ANAEROBIC  Final   Culture   Final           BLOOD CULTURE RECEIVED NO GROWTH TO DATE CULTURE WILL BE HELD FOR 5 DAYS BEFORE ISSUING A FINAL NEGATIVE REPORT Performed at [facility]    Report Status PENDING  Incomplete  Culture, blood (x 2)     Status: None (Preliminary result)   Collection Time: 12/03/14  1:25 AM  Result Value Ref Range Status   Specimen Description LEFT ANTECUBITAL  Final   Special Requests BOTTLES  DRAWN AEROBIC AND ANAEROBIC  Final   Culture   Final           BLOOD CULTURE RECEIVED NO GROWTH TO DATE CULTURE WILL BE HELD FOR 5 DAYS BEFORE ISSUING A FINAL NEGATIVE REPORT Performed at [facility]    Report Status PENDING  Incomplete     Labs: Basic Metabolic Panel:  Recent Labs Lab 12/02/14 2059 12/03/14 0428 12/04/14 0512  NA 137 140 137  K 3.9 3.8 3.8  CL 102 106 105  CO2 GLUCOSE 170* 84 103*  BUN CREATININE 1.25* 1.14 0.92  CALCIUM 9.9 8.8* 8.3*   Liver Function Tests:  Recent Labs Lab 12/02/14 2059  AST 22  ALT 22  ALKPHOS 93  BILITOT 0.7  PROT 7.5  ALBUMIN 3.9    Recent Labs Lab 12/03/14 0600  LIPASE 18*   No results for input(s): AMMONIA in the last 168 hours. CBC:  Recent Labs Lab 12/02/14 2100 12/03/14 0428 12/04/14 0512  WBC 15.3* 15.5* 11.0*  HGB 16.6 13.3 13.3  HCT 49.5 41.9 40.6  MCV 89.0 89.1 90.8  PLT 299 260 250   Cardiac Enzymes:  Recent Labs Lab 12/02/14 2059 12/03/14 0120 12/03/14 0645 12/03/14 1225  TROPONINI <0.03 <0.03 <0.03 <0.03   BNP: BNP (last 3 results) No results for input(s): BNP in the last 8760 hours.  ProBNP (last 3 results) No results for input(s): PROBNP in the  last 8760 hours.  CBG:  Recent Labs Lab 12/04/14 0732 12/04/14 1259 12/04/14 1654 12/04/14 2048 12/05/14 0720  GLUCAP 86 90 91 144* 85       Signed:  Penny Pia  [facility] 12/05/2014, 10:31 AM

## 2014-12-09 LAB — CULTURE, BLOOD (ROUTINE X 2)
CULTURE: NO GROWTH
Culture: NO GROWTH

## 2015-03-22 ENCOUNTER — Telehealth: Payer: Self-pay | Admitting: Nurse Practitioner

## 2015-03-22 NOTE — Telephone Encounter (Signed)
Attempted to call amber at number and extenstion below and no answer.

## 2015-03-22 NOTE — Telephone Encounter (Signed)
[facility] [phone] ext [phone]. Faxed form on 03/08/15 regarding Aggrenox so that paitent could have done there. Please call with status.

## 2015-03-23 NOTE — Telephone Encounter (Signed)
I spoke to [facility].  She is looking for procedure clearance, relating to aggrenox (off 7 days prior to procedure- lumbar facet injection).  She will fax to [phone] pod 1.

## 2015-03-23 NOTE — Telephone Encounter (Signed)
Patient has not been seen in 14 months. He has never seen Dr. Levada Dy who was assigned after Dr. Donn Pierini Love left.

## 2015-03-23 NOTE — Telephone Encounter (Signed)
Received.  To CM/NP for review and signature.

## 2015-03-23 NOTE — Telephone Encounter (Signed)
I called and LMVM mobile, home NIS for pt to return call about an appt needed prior to clearance for procedure lumbar facet injection.   Tried to call AMBER at [facility] to let her know pt needs appt but no answer.

## 2015-03-24 NOTE — Telephone Encounter (Signed)
LMVM for pt to return call about an appt with Dr. Marjory Lies.

## 2015-03-24 NOTE — Telephone Encounter (Signed)
I called and LMVM for Amber to have pt call office for f/u appt, (see Dr. Marjory Lies), whom has not see pt before, took over from Dr. Sandria Manly.

## 2015-03-25 NOTE — Telephone Encounter (Signed)
I faxed to Amber at [facility] that needs f/u appt here with Dr. Marjory Lies, or can get clearance from pts pcp.   I have left several messages for pt.

## 2015-03-25 NOTE — Telephone Encounter (Signed)
Ok to setup follow up appt, of can be clearance can be deferred to PCP. -VRP

## 2015-05-28 ENCOUNTER — Inpatient Hospital Stay (HOSPITAL_COMMUNITY): Payer: Medicaid Other | Admitting: Certified Registered"

## 2015-05-28 ENCOUNTER — Inpatient Hospital Stay (HOSPITAL_COMMUNITY)
Admission: EM | Admit: 2015-05-28 | Discharge: 2015-06-27 | DRG: 853 | Disposition: E | Payer: Medicaid Other | Attending: Cardiovascular Disease | Admitting: Cardiovascular Disease

## 2015-05-28 ENCOUNTER — Ambulatory Visit: Admission: AD | Admit: 2015-05-28 | Payer: Self-pay | Admitting: General Surgery

## 2015-05-28 ENCOUNTER — Encounter (HOSPITAL_COMMUNITY): Admission: EM | Disposition: E | Payer: Medicaid Other | Source: Home / Self Care | Attending: Cardiovascular Disease

## 2015-05-28 ENCOUNTER — Encounter (HOSPITAL_COMMUNITY): Payer: Self-pay | Admitting: Emergency Medicine

## 2015-05-28 ENCOUNTER — Emergency Department (HOSPITAL_COMMUNITY): Payer: Medicaid Other

## 2015-05-28 DIAGNOSIS — Z7951 Long term (current) use of inhaled steroids: Secondary | ICD-10-CM

## 2015-05-28 DIAGNOSIS — R569 Unspecified convulsions: Secondary | ICD-10-CM | POA: Diagnosis present

## 2015-05-28 DIAGNOSIS — G4733 Obstructive sleep apnea (adult) (pediatric): Secondary | ICD-10-CM | POA: Diagnosis present

## 2015-05-28 DIAGNOSIS — R6521 Severe sepsis with septic shock: Secondary | ICD-10-CM | POA: Diagnosis not present

## 2015-05-28 DIAGNOSIS — I452 Bifascicular block: Secondary | ICD-10-CM | POA: Diagnosis present

## 2015-05-28 DIAGNOSIS — J80 Acute respiratory distress syndrome: Secondary | ICD-10-CM | POA: Diagnosis not present

## 2015-05-28 DIAGNOSIS — I219 Acute myocardial infarction, unspecified: Secondary | ICD-10-CM

## 2015-05-28 DIAGNOSIS — Z833 Family history of diabetes mellitus: Secondary | ICD-10-CM | POA: Diagnosis not present

## 2015-05-28 DIAGNOSIS — I469 Cardiac arrest, cause unspecified: Secondary | ICD-10-CM | POA: Diagnosis not present

## 2015-05-28 DIAGNOSIS — F17210 Nicotine dependence, cigarettes, uncomplicated: Secondary | ICD-10-CM | POA: Diagnosis present

## 2015-05-28 DIAGNOSIS — E874 Mixed disorder of acid-base balance: Secondary | ICD-10-CM | POA: Diagnosis not present

## 2015-05-28 DIAGNOSIS — I10 Essential (primary) hypertension: Secondary | ICD-10-CM | POA: Diagnosis present

## 2015-05-28 DIAGNOSIS — I480 Paroxysmal atrial fibrillation: Secondary | ICD-10-CM | POA: Diagnosis not present

## 2015-05-28 DIAGNOSIS — A419 Sepsis, unspecified organism: Secondary | ICD-10-CM | POA: Diagnosis present

## 2015-05-28 DIAGNOSIS — E8809 Other disorders of plasma-protein metabolism, not elsewhere classified: Secondary | ICD-10-CM | POA: Diagnosis not present

## 2015-05-28 DIAGNOSIS — K219 Gastro-esophageal reflux disease without esophagitis: Secondary | ICD-10-CM | POA: Diagnosis present

## 2015-05-28 DIAGNOSIS — I2510 Atherosclerotic heart disease of native coronary artery without angina pectoris: Secondary | ICD-10-CM | POA: Diagnosis not present

## 2015-05-28 DIAGNOSIS — L8990 Pressure ulcer of unspecified site, unspecified stage: Secondary | ICD-10-CM | POA: Insufficient documentation

## 2015-05-28 DIAGNOSIS — J9600 Acute respiratory failure, unspecified whether with hypoxia or hypercapnia: Secondary | ICD-10-CM | POA: Diagnosis not present

## 2015-05-28 DIAGNOSIS — Z8673 Personal history of transient ischemic attack (TIA), and cerebral infarction without residual deficits: Secondary | ICD-10-CM

## 2015-05-28 DIAGNOSIS — I2109 ST elevation (STEMI) myocardial infarction involving other coronary artery of anterior wall: Secondary | ICD-10-CM | POA: Diagnosis not present

## 2015-05-28 DIAGNOSIS — D649 Anemia, unspecified: Secondary | ICD-10-CM | POA: Diagnosis not present

## 2015-05-28 DIAGNOSIS — I2111 ST elevation (STEMI) myocardial infarction involving right coronary artery: Secondary | ICD-10-CM | POA: Diagnosis not present

## 2015-05-28 DIAGNOSIS — K3580 Unspecified acute appendicitis: Secondary | ICD-10-CM | POA: Diagnosis not present

## 2015-05-28 DIAGNOSIS — R609 Edema, unspecified: Secondary | ICD-10-CM

## 2015-05-28 DIAGNOSIS — I213 ST elevation (STEMI) myocardial infarction of unspecified site: Secondary | ICD-10-CM | POA: Diagnosis present

## 2015-05-28 DIAGNOSIS — E871 Hypo-osmolality and hyponatremia: Secondary | ICD-10-CM | POA: Diagnosis not present

## 2015-05-28 DIAGNOSIS — Z515 Encounter for palliative care: Secondary | ICD-10-CM | POA: Diagnosis not present

## 2015-05-28 DIAGNOSIS — Z66 Do not resuscitate: Secondary | ICD-10-CM | POA: Diagnosis not present

## 2015-05-28 DIAGNOSIS — I4901 Ventricular fibrillation: Secondary | ICD-10-CM | POA: Diagnosis not present

## 2015-05-28 DIAGNOSIS — I472 Ventricular tachycardia: Secondary | ICD-10-CM | POA: Diagnosis not present

## 2015-05-28 DIAGNOSIS — R062 Wheezing: Secondary | ICD-10-CM | POA: Insufficient documentation

## 2015-05-28 DIAGNOSIS — M1990 Unspecified osteoarthritis, unspecified site: Secondary | ICD-10-CM | POA: Diagnosis present

## 2015-05-28 DIAGNOSIS — N132 Hydronephrosis with renal and ureteral calculous obstruction: Secondary | ICD-10-CM | POA: Diagnosis present

## 2015-05-28 DIAGNOSIS — Z96649 Presence of unspecified artificial hip joint: Secondary | ICD-10-CM | POA: Diagnosis present

## 2015-05-28 DIAGNOSIS — Z7984 Long term (current) use of oral hypoglycemic drugs: Secondary | ICD-10-CM

## 2015-05-28 DIAGNOSIS — R1031 Right lower quadrant pain: Secondary | ICD-10-CM | POA: Diagnosis present

## 2015-05-28 DIAGNOSIS — Z538 Procedure and treatment not carried out for other reasons: Secondary | ICD-10-CM | POA: Diagnosis present

## 2015-05-28 DIAGNOSIS — I97711 Intraoperative cardiac arrest during other surgery: Secondary | ICD-10-CM | POA: Diagnosis not present

## 2015-05-28 DIAGNOSIS — K353 Acute appendicitis with localized peritonitis, without perforation or gangrene: Secondary | ICD-10-CM

## 2015-05-28 DIAGNOSIS — E876 Hypokalemia: Secondary | ICD-10-CM | POA: Diagnosis present

## 2015-05-28 DIAGNOSIS — N179 Acute kidney failure, unspecified: Secondary | ICD-10-CM | POA: Diagnosis not present

## 2015-05-28 DIAGNOSIS — Z6841 Body Mass Index (BMI) 40.0 and over, adult: Secondary | ICD-10-CM

## 2015-05-28 DIAGNOSIS — N400 Enlarged prostate without lower urinary tract symptoms: Secondary | ICD-10-CM | POA: Diagnosis present

## 2015-05-28 DIAGNOSIS — J9602 Acute respiratory failure with hypercapnia: Secondary | ICD-10-CM | POA: Diagnosis not present

## 2015-05-28 DIAGNOSIS — J9601 Acute respiratory failure with hypoxia: Secondary | ICD-10-CM | POA: Diagnosis not present

## 2015-05-28 DIAGNOSIS — J811 Chronic pulmonary edema: Secondary | ICD-10-CM

## 2015-05-28 DIAGNOSIS — N200 Calculus of kidney: Secondary | ICD-10-CM

## 2015-05-28 DIAGNOSIS — Z7902 Long term (current) use of antithrombotics/antiplatelets: Secondary | ICD-10-CM

## 2015-05-28 DIAGNOSIS — I4891 Unspecified atrial fibrillation: Secondary | ICD-10-CM | POA: Diagnosis not present

## 2015-05-28 DIAGNOSIS — Z4659 Encounter for fitting and adjustment of other gastrointestinal appliance and device: Secondary | ICD-10-CM

## 2015-05-28 DIAGNOSIS — I462 Cardiac arrest due to underlying cardiac condition: Secondary | ICD-10-CM | POA: Diagnosis not present

## 2015-05-28 DIAGNOSIS — I209 Angina pectoris, unspecified: Secondary | ICD-10-CM

## 2015-05-28 DIAGNOSIS — I2119 ST elevation (STEMI) myocardial infarction involving other coronary artery of inferior wall: Secondary | ICD-10-CM

## 2015-05-28 DIAGNOSIS — I5021 Acute systolic (congestive) heart failure: Secondary | ICD-10-CM | POA: Diagnosis not present

## 2015-05-28 DIAGNOSIS — E1165 Type 2 diabetes mellitus with hyperglycemia: Secondary | ICD-10-CM | POA: Diagnosis present

## 2015-05-28 DIAGNOSIS — R570 Cardiogenic shock: Secondary | ICD-10-CM | POA: Diagnosis not present

## 2015-05-28 DIAGNOSIS — I442 Atrioventricular block, complete: Secondary | ICD-10-CM | POA: Insufficient documentation

## 2015-05-28 DIAGNOSIS — Z9289 Personal history of other medical treatment: Secondary | ICD-10-CM

## 2015-05-28 DIAGNOSIS — R34 Anuria and oliguria: Secondary | ICD-10-CM | POA: Diagnosis not present

## 2015-05-28 DIAGNOSIS — K3589 Other acute appendicitis: Secondary | ICD-10-CM | POA: Diagnosis not present

## 2015-05-28 DIAGNOSIS — E2740 Unspecified adrenocortical insufficiency: Secondary | ICD-10-CM | POA: Diagnosis not present

## 2015-05-28 DIAGNOSIS — R0902 Hypoxemia: Secondary | ICD-10-CM

## 2015-05-28 DIAGNOSIS — Z452 Encounter for adjustment and management of vascular access device: Secondary | ICD-10-CM

## 2015-05-28 DIAGNOSIS — L0291 Cutaneous abscess, unspecified: Secondary | ICD-10-CM

## 2015-05-28 HISTORY — PX: CARDIAC CATHETERIZATION: SHX172

## 2015-05-28 HISTORY — PX: LAPAROSCOPY: SHX197

## 2015-05-28 LAB — URINALYSIS, ROUTINE W REFLEX MICROSCOPIC
Bilirubin Urine: NEGATIVE
Glucose, UA: NEGATIVE mg/dL
Ketones, ur: NEGATIVE mg/dL
NITRITE: NEGATIVE
Protein, ur: NEGATIVE mg/dL
Specific Gravity, Urine: 1.034 — ABNORMAL HIGH (ref 1.005–1.030)
pH: 6.5 (ref 5.0–8.0)

## 2015-05-28 LAB — CBC WITH DIFFERENTIAL/PLATELET
BASOS ABS: 0 10*3/uL (ref 0.0–0.1)
BASOS PCT: 0 %
EOS ABS: 0 10*3/uL (ref 0.0–0.7)
Eosinophils Relative: 0 %
HCT: 40.8 % (ref 39.0–52.0)
HEMOGLOBIN: 13.4 g/dL (ref 13.0–17.0)
Lymphocytes Relative: 8 %
Lymphs Abs: 1.4 10*3/uL (ref 0.7–4.0)
MCH: 30.1 pg (ref 26.0–34.0)
MCHC: 32.8 g/dL (ref 30.0–36.0)
MCV: 91.7 fL (ref 78.0–100.0)
Monocytes Absolute: 0.4 10*3/uL (ref 0.1–1.0)
Monocytes Relative: 2 %
NEUTROS PCT: 90 %
Neutro Abs: 15.4 10*3/uL — ABNORMAL HIGH (ref 1.7–7.7)
Platelets: 209 10*3/uL (ref 150–400)
RBC: 4.45 MIL/uL (ref 4.22–5.81)
RDW: 14 % (ref 11.5–15.5)
WBC: 17.1 10*3/uL — AB (ref 4.0–10.5)

## 2015-05-28 LAB — CBC
HCT: 44.2 % (ref 39.0–52.0)
HEMOGLOBIN: 14.9 g/dL (ref 13.0–17.0)
MCH: 29.9 pg (ref 26.0–34.0)
MCHC: 33.7 g/dL (ref 30.0–36.0)
MCV: 88.6 fL (ref 78.0–100.0)
Platelets: 239 10*3/uL (ref 150–400)
RBC: 4.99 MIL/uL (ref 4.22–5.81)
RDW: 13.8 % (ref 11.5–15.5)
WBC: 19.5 10*3/uL — AB (ref 4.0–10.5)

## 2015-05-28 LAB — BLOOD GAS, ARTERIAL
Acid-base deficit: 5.8 mmol/L — ABNORMAL HIGH (ref 0.0–2.0)
Bicarbonate: 22.1 mEq/L (ref 20.0–24.0)
DRAWN BY: 441261
FIO2: 1
MECHVT: 580 mL
O2 SAT: 98.4 %
PATIENT TEMPERATURE: 98.6
PCO2 ART: 55.6 mmHg — AB (ref 35.0–45.0)
PEEP: 0 cmH2O
PH ART: 7.224 — AB (ref 7.350–7.450)
PO2 ART: 164 mmHg — AB (ref 80.0–100.0)
RATE: 14 resp/min
TCO2: 20.5 mmol/L (ref 0–100)

## 2015-05-28 LAB — MAGNESIUM: Magnesium: 1.4 mg/dL — ABNORMAL LOW (ref 1.7–2.4)

## 2015-05-28 LAB — COMPREHENSIVE METABOLIC PANEL
ALK PHOS: 60 U/L (ref 38–126)
ALT: 17 U/L (ref 17–63)
ALT: 16 U/L — ABNORMAL LOW (ref 17–63)
ANION GAP: 6 (ref 5–15)
AST: 20 U/L (ref 15–41)
AST: 25 U/L (ref 15–41)
Albumin: 4.1 g/dL (ref 3.5–5.0)
Albumin: 3 g/dL — ABNORMAL LOW (ref 3.5–5.0)
Alkaline Phosphatase: 86 U/L (ref 38–126)
Anion gap: 7 (ref 5–15)
BILIRUBIN TOTAL: 0.7 mg/dL (ref 0.3–1.2)
BUN: 20 mg/dL (ref 6–20)
BUN: 19 mg/dL (ref 6–20)
CALCIUM: 11.1 mg/dL — AB (ref 8.9–10.3)
CO2: 30 mmol/L (ref 22–32)
CO2: 24 mmol/L (ref 22–32)
Calcium: 9.1 mg/dL (ref 8.9–10.3)
Chloride: 101 mmol/L (ref 101–111)
Chloride: 107 mmol/L (ref 101–111)
Creatinine, Ser: 1.04 mg/dL (ref 0.61–1.24)
Creatinine, Ser: 1.51 mg/dL — ABNORMAL HIGH (ref 0.61–1.24)
GFR calc non Af Amer: >60 mL/min (ref >60)
GFR calc non Af Amer: 49 mL/min — ABNORMAL LOW (ref >60)
GFR, EST AFRICAN AMERICAN: 57 mL/min — AB (ref >60)
Glucose, Bld: 130 mg/dL — ABNORMAL HIGH (ref 65–99)
Glucose, Bld: 132 mg/dL — ABNORMAL HIGH (ref 65–99)
Potassium: 3.4 mmol/L — ABNORMAL LOW (ref 3.5–5.1)
Potassium: 3.1 mmol/L — ABNORMAL LOW (ref 3.5–5.1)
SODIUM: 137 mmol/L (ref 135–145)
Sodium: 138 mmol/L (ref 135–145)
TOTAL PROTEIN: 7.1 g/dL (ref 6.5–8.1)
Total Bilirubin: 1.3 mg/dL — ABNORMAL HIGH (ref 0.3–1.2)
Total Protein: 5.5 g/dL — ABNORMAL LOW (ref 6.5–8.1)

## 2015-05-28 LAB — POCT I-STAT 3, ART BLOOD GAS (G3+)
ACID-BASE DEFICIT: 4 mmol/L — AB (ref 0.0–2.0)
BICARBONATE: 23.8 meq/L (ref 20.0–24.0)
O2 SAT: 99 %
PCO2 ART: 51.3 mmHg — AB (ref 35.0–45.0)
PO2 ART: 181 mmHg — AB (ref 80.0–100.0)
TCO2: 25 mmol/L (ref 0–100)
pH, Arterial: 7.275 — ABNORMAL LOW (ref 7.350–7.450)

## 2015-05-28 LAB — TROPONIN I
TROPONIN I: 0.65 ng/mL — AB (ref <0.031)
Troponin I: >65 ng/mL (ref <0.031)

## 2015-05-28 LAB — LIPASE, BLOOD: Lipase: 21 U/L (ref 11–51)

## 2015-05-28 LAB — GLUCOSE, CAPILLARY
GLUCOSE-CAPILLARY: 98 mg/dL (ref 65–99)
Glucose-Capillary: 172 mg/dL — ABNORMAL HIGH (ref 65–99)

## 2015-05-28 LAB — CK TOTAL AND CKMB (NOT AT ARMC)
CK TOTAL: 49 U/L (ref 49–397)
CK, MB: 8.9 ng/mL — AB (ref 0.5–5.0)
RELATIVE INDEX: INVALID (ref 0.0–2.5)

## 2015-05-28 LAB — POCT ACTIVATED CLOTTING TIME: Activated Clotting Time: 641 seconds

## 2015-05-28 LAB — URINE MICROSCOPIC-ADD ON

## 2015-05-28 SURGERY — LEFT HEART CATH
Anesthesia: General

## 2015-05-28 SURGERY — LAPAROSCOPY, DIAGNOSTIC
Anesthesia: General

## 2015-05-28 MED ORDER — DIPHENHYDRAMINE HCL 25 MG PO CAPS: 25.0000 mg | ORAL_CAPSULE | Freq: Four times a day (QID) | ORAL | Status: DC | PRN

## 2015-05-28 MED ORDER — SIMETHICONE 80 MG PO CHEW
40.0000 mg | CHEWABLE_TABLET | Freq: Four times a day (QID) | ORAL | Status: DC | PRN
Start: 2015-05-28 — End: 2015-06-10
  Filled 2015-05-28: qty 1

## 2015-05-28 MED ORDER — NOREPINEPHRINE BITARTRATE 1 MG/ML IV SOLN
0.0000 ug/min | INTRAVENOUS | Status: DC
  Filled 2015-05-28: qty 4

## 2015-05-28 MED ORDER — GLYCOPYRROLATE 0.2 MG/ML IJ SOLN
INTRAMUSCULAR | Status: DC | PRN
  Administered 2015-05-28 (×2): 0.2 mg via INTRAVENOUS

## 2015-05-28 MED ORDER — LIDOCAINE IN D5W 4-5 MG/ML-% IV SOLN: 1.0000 mg/min | INTRAVENOUS | Status: DC

## 2015-05-28 MED ORDER — PANTOPRAZOLE SODIUM 40 MG PO TBEC: 40.0000 mg | DELAYED_RELEASE_TABLET | Freq: Every day | ORAL | Status: DC

## 2015-05-28 MED ORDER — 0.9 % SODIUM CHLORIDE (POUR BTL) OPTIME
TOPICAL | Status: DC | PRN
  Administered 2015-05-28: 1000 mL

## 2015-05-28 MED ORDER — POTASSIUM CHLORIDE 10 MEQ/100ML IV SOLN
10.0000 meq | Freq: Once | INTRAVENOUS | Status: AC
  Filled 2015-05-28: qty 100

## 2015-05-28 MED ORDER — FENTANYL CITRATE (PF) 100 MCG/2ML IJ SOLN
INTRAMUSCULAR | Status: DC | PRN
  Administered 2015-05-28: 100 ug via INTRAVENOUS
  Administered 2015-05-28: 50 ug via INTRAVENOUS

## 2015-05-28 MED ORDER — AMIODARONE HCL 150 MG/3ML IV SOLN
INTRAVENOUS | Status: AC
  Filled 2015-05-28: qty 3

## 2015-05-28 MED ORDER — MIDAZOLAM HCL 2 MG/2ML IJ SOLN
INTRAMUSCULAR | Status: AC
  Filled 2015-05-28: qty 2

## 2015-05-28 MED ORDER — AMIODARONE HCL IN DEXTROSE 360-4.14 MG/200ML-% IV SOLN
60.0000 mg/h | INTRAVENOUS | Status: AC
Start: 2015-05-28 — End: 2015-05-29
  Administered 2015-05-28: 60 mg/h via INTRAVENOUS
  Filled 2015-05-28 (×2): qty 200

## 2015-05-28 MED ORDER — EPHEDRINE SULFATE 50 MG/ML IJ SOLN
INTRAMUSCULAR | Status: DC | PRN
  Administered 2015-05-28 (×5): 10 mg via INTRAVENOUS
  Administered 2015-05-28: 20 mg via INTRAVENOUS

## 2015-05-28 MED ORDER — TIROFIBAN HCL IN NACL 5-0.9 MG/100ML-% IV SOLN
0.1500 ug/kg/min | INTRAVENOUS | Status: AC
  Administered 2015-05-28 – 2015-05-29 (×4): 0.15 ug/kg/min via INTRAVENOUS
  Filled 2015-05-28 (×3): qty 100

## 2015-05-28 MED ORDER — METRONIDAZOLE IN NACL 5-0.79 MG/ML-% IV SOLN
500.0000 mg | Freq: Three times a day (TID) | INTRAVENOUS | Status: DC
  Administered 2015-05-28 (×2): 500 mg via INTRAVENOUS
  Filled 2015-05-28 (×2): qty 100

## 2015-05-28 MED ORDER — PHENYLEPHRINE HCL 10 MG/ML IJ SOLN
10.0000 mg | INTRAVENOUS | Status: DC | PRN
  Administered 2015-05-28: 40 ug/min via INTRAVENOUS

## 2015-05-28 MED ORDER — INSULIN ASPART 100 UNIT/ML ~~LOC~~ SOLN
0.0000 [IU] | Freq: Three times a day (TID) | SUBCUTANEOUS | Status: DC
  Administered 2015-05-29 (×2): 3 [IU] via SUBCUTANEOUS

## 2015-05-28 MED ORDER — PIPERACILLIN-TAZOBACTAM 3.375 G IVPB
3.3750 g | Freq: Once | INTRAVENOUS | Status: AC
  Administered 2015-05-28: 3.375 g via INTRAVENOUS
  Filled 2015-05-28: qty 50

## 2015-05-28 MED ORDER — METRONIDAZOLE IN NACL 5-0.79 MG/ML-% IV SOLN
INTRAVENOUS | Status: AC
  Filled 2015-05-28: qty 100

## 2015-05-28 MED ORDER — OXYCODONE HCL 5 MG PO TABS: 5.0000 mg | ORAL_TABLET | ORAL | Status: DC | PRN

## 2015-05-28 MED ORDER — FENTANYL CITRATE (PF) 100 MCG/2ML IJ SOLN
INTRAMUSCULAR | Status: AC
  Filled 2015-05-28: qty 2

## 2015-05-28 MED ORDER — FENTANYL CITRATE (PF) 250 MCG/5ML IJ SOLN
INTRAMUSCULAR | Status: AC
  Filled 2015-05-28: qty 5

## 2015-05-28 MED ORDER — BUPIVACAINE-EPINEPHRINE 0.25% -1:200000 IJ SOLN
INTRAMUSCULAR | Status: DC | PRN
  Administered 2015-05-28: 13 mL

## 2015-05-28 MED ORDER — MORPHINE SULFATE (PF) 2 MG/ML IV SOLN
1.0000 mg | INTRAVENOUS | Status: DC | PRN
  Administered 2015-05-28 (×4): 4 mg via INTRAVENOUS
  Filled 2015-05-28 (×4): qty 2

## 2015-05-28 MED ORDER — ONDANSETRON 4 MG PO TBDP
4.0000 mg | ORAL_TABLET | Freq: Four times a day (QID) | ORAL | Status: DC | PRN
  Filled 2015-05-28: qty 1

## 2015-05-28 MED ORDER — FENTANYL CITRATE (PF) 100 MCG/2ML IJ SOLN
INTRAMUSCULAR | Status: DC | PRN
  Administered 2015-05-28 (×3): 50 ug via INTRAVENOUS

## 2015-05-28 MED ORDER — LAMOTRIGINE 100 MG PO TABS
100.0000 mg | ORAL_TABLET | Freq: Two times a day (BID) | ORAL | Status: DC
  Administered 2015-05-28: 100 mg via ORAL
  Filled 2015-05-28 (×2): qty 1

## 2015-05-28 MED ORDER — DIPHENHYDRAMINE HCL 50 MG/ML IJ SOLN: 25.0000 mg | Freq: Four times a day (QID) | INTRAMUSCULAR | Status: DC | PRN

## 2015-05-28 MED ORDER — MIDAZOLAM HCL 5 MG/5ML IJ SOLN
INTRAMUSCULAR | Status: DC | PRN
  Administered 2015-05-28 (×3): 1 mg via INTRAVENOUS

## 2015-05-28 MED ORDER — MORPHINE SULFATE (PF) 4 MG/ML IV SOLN
4.0000 mg | Freq: Once | INTRAVENOUS | Status: AC
Start: 2015-05-28 — End: 2015-05-28
  Administered 2015-05-28: 4 mg via INTRAVENOUS
  Filled 2015-05-28: qty 1

## 2015-05-28 MED ORDER — LACTATED RINGERS IV SOLN
INTRAVENOUS | Status: DC | PRN
  Administered 2015-05-28: 18:00:00 via INTRAVENOUS

## 2015-05-28 MED ORDER — NITROGLYCERIN 1 MG/10 ML FOR IR/CATH LAB
INTRA_ARTERIAL | Status: AC
  Filled 2015-05-28: qty 10

## 2015-05-28 MED ORDER — SODIUM CHLORIDE 0.9 % IV SOLN
1.0000 mg/h | INTRAVENOUS | Status: DC
  Administered 2015-05-28: 2 mg/h via INTRAVENOUS
  Filled 2015-05-28: qty 10

## 2015-05-28 MED ORDER — SUCCINYLCHOLINE CHLORIDE 20 MG/ML IJ SOLN
INTRAMUSCULAR | Status: DC | PRN
  Administered 2015-05-28: 100 mg via INTRAVENOUS

## 2015-05-28 MED ORDER — ONDANSETRON HCL 4 MG/2ML IJ SOLN
4.0000 mg | Freq: Four times a day (QID) | INTRAMUSCULAR | Status: DC | PRN
  Administered 2015-05-28 (×2): 4 mg via INTRAVENOUS
  Filled 2015-05-28 (×2): qty 2

## 2015-05-28 MED ORDER — MIDAZOLAM HCL 2 MG/2ML IJ SOLN: 2.0000 mg | INTRAMUSCULAR | Status: DC | PRN

## 2015-05-28 MED ORDER — EPHEDRINE SULFATE 50 MG/ML IJ SOLN
INTRAMUSCULAR | Status: AC
  Filled 2015-05-28: qty 3

## 2015-05-28 MED ORDER — PIPERACILLIN-TAZOBACTAM 3.375 G IVPB
3.3750 g | Freq: Three times a day (TID) | INTRAVENOUS | Status: DC
  Filled 2015-05-28 (×2): qty 50

## 2015-05-28 MED ORDER — BIVALIRUDIN 250 MG IV SOLR
INTRAVENOUS | Status: AC
  Filled 2015-05-28: qty 250

## 2015-05-28 MED ORDER — ASPIRIN 81 MG PO CHEW
324.0000 mg | CHEWABLE_TABLET | Freq: Once | ORAL | Status: AC
  Administered 2015-05-28: 324 mg via NASOGASTRIC
  Filled 2015-05-28: qty 4

## 2015-05-28 MED ORDER — MAGNESIUM SULFATE IN D5W 10-5 MG/ML-% IV SOLN
1.0000 g | Freq: Once | INTRAVENOUS | Status: AC
  Administered 2015-05-28: 1 g via INTRAVENOUS
  Filled 2015-05-28: qty 100

## 2015-05-28 MED ORDER — ONDANSETRON HCL 4 MG/2ML IJ SOLN
4.0000 mg | Freq: Once | INTRAMUSCULAR | Status: AC | PRN
  Administered 2015-05-28: 4 mg via INTRAVENOUS
  Filled 2015-05-28: qty 2

## 2015-05-28 MED ORDER — LIDOCAINE BOLUS VIA INFUSION
50.0000 mg | Freq: Once | INTRAVENOUS | Status: AC
  Administered 2015-05-28: 50 mg via INTRAVENOUS
  Filled 2015-05-28: qty 52

## 2015-05-28 MED ORDER — IPRATROPIUM-ALBUTEROL 0.5-2.5 (3) MG/3ML IN SOLN: 3.0000 mL | RESPIRATORY_TRACT | Status: DC | PRN

## 2015-05-28 MED ORDER — POTASSIUM CHLORIDE 10 MEQ/50ML IV SOLN
INTRAVENOUS | Status: AC
  Administered 2015-05-28: 10 meq
  Filled 2015-05-28: qty 50

## 2015-05-28 MED ORDER — LACTATED RINGERS IR SOLN
Status: DC | PRN
  Administered 2015-05-28: 1000 mL

## 2015-05-28 MED ORDER — IOHEXOL 350 MG/ML SOLN
INTRAVENOUS | Status: DC | PRN
  Administered 2015-05-28: 175 mL via INTRACARDIAC

## 2015-05-28 MED ORDER — LIDOCAINE HCL (CARDIAC) 20 MG/ML IV SOLN
0.7500 mg/kg | Freq: Once | INTRAVENOUS | Status: AC
  Administered 2015-05-28: 89.4 mg via INTRAVENOUS

## 2015-05-28 MED ORDER — TAMSULOSIN HCL 0.4 MG PO CAPS: 0.4000 mg | ORAL_CAPSULE | Freq: Every day | ORAL | Status: AC

## 2015-05-28 MED ORDER — NOREPINEPHRINE BITARTRATE 1 MG/ML IV SOLN: 4.0000 mg | INTRAVENOUS | Status: DC | PRN

## 2015-05-28 MED ORDER — SODIUM CHLORIDE 0.9 % IV SOLN
INTRAVENOUS | Status: DC
  Administered 2015-05-28: 23:00:00 via INTRAVENOUS

## 2015-05-28 MED ORDER — PROPOFOL 10 MG/ML IV BOLUS
INTRAVENOUS | Status: DC | PRN
  Administered 2015-05-28: 200 mg via INTRAVENOUS

## 2015-05-28 MED ORDER — LIDOCAINE IN D5W 4-5 MG/ML-% IV SOLN
1.0000 mg/min | INTRAVENOUS | Status: DC
  Administered 2015-05-28 – 2015-05-29 (×2): 2 mg/min via INTRAVENOUS
  Administered 2015-05-29: 3 mg/min via INTRAVENOUS
  Administered 2015-05-30 – 2015-06-01 (×3): 2 mg/min via INTRAVENOUS
  Administered 2015-06-02: 1 mg/min via INTRAVENOUS
  Filled 2015-05-28 (×6): qty 500

## 2015-05-28 MED ORDER — INFLUENZA VAC SPLIT QUAD 0.5 ML IM SUSY
0.5000 mL | PREFILLED_SYRINGE | INTRAMUSCULAR | Status: DC
  Filled 2015-05-28 (×2): qty 0.5

## 2015-05-28 MED ORDER — DEXTROSE 5 % IV SOLN
0.0000 ug/min | INTRAVENOUS | Status: DC
  Administered 2015-05-28: 20 ug/min via INTRAVENOUS
  Administered 2015-05-30: 40 ug/min via INTRAVENOUS
  Administered 2015-05-30: 30 ug/min via INTRAVENOUS
  Administered 2015-05-31 (×2): 40 ug/min via INTRAVENOUS
  Administered 2015-05-31: 30 ug/min via INTRAVENOUS
  Administered 2015-06-01: 45 ug/min via INTRAVENOUS
  Administered 2015-06-01 (×2): 40 ug/min via INTRAVENOUS
  Administered 2015-06-02: 60 ug/min via INTRAVENOUS
  Administered 2015-06-02: 34 ug/min via INTRAVENOUS
  Administered 2015-06-03: 32 ug/min via INTRAVENOUS
  Administered 2015-06-04: 30 ug/min via INTRAVENOUS
  Administered 2015-06-05: 2 ug/min via INTRAVENOUS
  Administered 2015-06-06: 3 ug/min via INTRAVENOUS
  Administered 2015-06-07: 2 ug/min via INTRAVENOUS
  Administered 2015-06-08: 19 ug/min via INTRAVENOUS
  Administered 2015-06-08: 24 ug/min via INTRAVENOUS
  Administered 2015-06-09: 18 ug/min via INTRAVENOUS
  Filled 2015-05-28 (×27): qty 16

## 2015-05-28 MED ORDER — MIDAZOLAM HCL 2 MG/2ML IJ SOLN
INTRAMUSCULAR | Status: DC | PRN
  Administered 2015-05-28 (×3): 2 mg via INTRAVENOUS

## 2015-05-28 MED ORDER — POTASSIUM CHLORIDE IN NACL 20-0.9 MEQ/L-% IV SOLN
INTRAVENOUS | Status: DC
  Administered 2015-05-28: 11:00:00 via INTRAVENOUS
  Filled 2015-05-28 (×4): qty 1000

## 2015-05-28 MED ORDER — FENTANYL CITRATE (PF) 100 MCG/2ML IJ SOLN
50.0000 ug | Freq: Once | INTRAMUSCULAR | Status: DC
Start: 2015-05-28 — End: 2015-06-09

## 2015-05-28 MED ORDER — PROMETHAZINE HCL 25 MG/ML IJ SOLN: 6.2500 mg | INTRAMUSCULAR | Status: DC | PRN

## 2015-05-28 MED ORDER — IOHEXOL 300 MG/ML  SOLN
100.0000 mL | Freq: Once | INTRAMUSCULAR | Status: AC | PRN
  Administered 2015-05-28: 100 mL via INTRAVENOUS

## 2015-05-28 MED ORDER — LIDOCAINE IN D5W 4-5 MG/ML-% IV SOLN
INTRAVENOUS | Status: DC | PRN
  Administered 2015-05-28: 4 mg/min via INTRAVENOUS

## 2015-05-28 MED ORDER — AMIODARONE HCL IN DEXTROSE 360-4.14 MG/200ML-% IV SOLN
60.0000 mg/h | INTRAVENOUS | Status: DC
  Administered 2015-05-29 – 2015-05-30 (×4): 30 mg/h via INTRAVENOUS
  Administered 2015-05-30: 60 mg/h via INTRAVENOUS
  Administered 2015-05-30: 30 mg/h via INTRAVENOUS
  Administered 2015-05-30 – 2015-05-31 (×5): 60 mg/h via INTRAVENOUS
  Administered 2015-06-01: 30 mg/h via INTRAVENOUS
  Administered 2015-06-01: 60 mg/h via INTRAVENOUS
  Administered 2015-06-01: 30 mg/h via INTRAVENOUS
  Administered 2015-06-02 – 2015-06-05 (×14): 60 mg/h via INTRAVENOUS
  Administered 2015-06-06: 30 mg/h via INTRAVENOUS
  Administered 2015-06-06 – 2015-06-07 (×2): 60 mg/h via INTRAVENOUS
  Administered 2015-06-07: 30 mg/h via INTRAVENOUS
  Administered 2015-06-08 – 2015-06-09 (×6): 60 mg/h via INTRAVENOUS
  Filled 2015-05-28 (×43): qty 200

## 2015-05-28 MED ORDER — BUPIVACAINE-EPINEPHRINE 0.25% -1:200000 IJ SOLN
INTRAMUSCULAR | Status: AC
  Filled 2015-05-28: qty 1

## 2015-05-28 MED ORDER — PIPERACILLIN-TAZOBACTAM 3.375 G IVPB 30 MIN
3.3750 g | Freq: Once | INTRAVENOUS | Status: AC
  Administered 2015-05-29: 3.375 g via INTRAVENOUS
  Filled 2015-05-28: qty 50

## 2015-05-28 MED ORDER — ONDANSETRON HCL 4 MG/2ML IJ SOLN
INTRAMUSCULAR | Status: AC
  Filled 2015-05-28: qty 2

## 2015-05-28 MED ORDER — TIROFIBAN HCL IN NACL 5-0.9 MG/100ML-% IV SOLN
INTRAVENOUS | Status: DC | PRN
  Administered 2015-05-28: 0.15 ug/kg/min via INTRAVENOUS

## 2015-05-28 MED ORDER — HYDROMORPHONE HCL 1 MG/ML IJ SOLN: 0.2500 mg | INTRAMUSCULAR | Status: DC | PRN

## 2015-05-28 MED ORDER — LIDOCAINE HCL (CARDIAC) 20 MG/ML IV SOLN
INTRAVENOUS | Status: AC
  Filled 2015-05-28: qty 5

## 2015-05-28 MED ORDER — HYDROMORPHONE HCL 1 MG/ML IJ SOLN
1.0000 mg | Freq: Once | INTRAMUSCULAR | Status: AC
  Administered 2015-05-28: 1 mg via INTRAVENOUS
  Filled 2015-05-28: qty 1

## 2015-05-28 MED ORDER — SODIUM CHLORIDE 0.9 % IV SOLN
0.0000 mg/h | INTRAVENOUS | Status: DC
  Administered 2015-05-28 – 2015-05-29 (×2): 2 mg/h via INTRAVENOUS
  Administered 2015-05-30: 8 mg/h via INTRAVENOUS
  Administered 2015-05-30 – 2015-06-01 (×5): 4 mg/h via INTRAVENOUS
  Administered 2015-06-02: 3 mg/h via INTRAVENOUS
  Administered 2015-06-03 – 2015-06-04 (×3): 4 mg/h via INTRAVENOUS
  Administered 2015-06-05: 2 mg/h via INTRAVENOUS
  Administered 2015-06-06: 4 mg/h via INTRAVENOUS
  Administered 2015-06-07: 6 mg/h via INTRAVENOUS
  Administered 2015-06-07: 4 mg/h via INTRAVENOUS
  Administered 2015-06-07 – 2015-06-08 (×2): 6 mg/h via INTRAVENOUS
  Administered 2015-06-08: 3 mg/h via INTRAVENOUS
  Filled 2015-05-28 (×21): qty 10

## 2015-05-28 MED ORDER — SODIUM CHLORIDE 0.9 % IV SOLN
25.0000 ug/h | INTRAVENOUS | Status: DC
  Administered 2015-05-28: 100 ug/h via INTRAVENOUS
  Administered 2015-05-30 – 2015-05-31 (×4): 200 ug/h via INTRAVENOUS
  Administered 2015-06-01: 175 ug/h via INTRAVENOUS
  Administered 2015-06-01 – 2015-06-05 (×6): 200 ug/h via INTRAVENOUS
  Administered 2015-06-06: 100 ug/h via INTRAVENOUS
  Administered 2015-06-06 – 2015-06-07 (×2): 200 ug/h via INTRAVENOUS
  Administered 2015-06-07 – 2015-06-08 (×2): 250 ug/h via INTRAVENOUS
  Administered 2015-06-08: 200 ug/h via INTRAVENOUS
  Administered 2015-06-09: 100 ug/h via INTRAVENOUS
  Filled 2015-05-28 (×22): qty 50

## 2015-05-28 MED ORDER — SODIUM CHLORIDE 0.9 % IV SOLN
250.0000 mg | INTRAVENOUS | Status: DC | PRN
  Administered 2015-05-28: 1.75 mg/kg/h via INTRAVENOUS

## 2015-05-28 MED ORDER — ALBUTEROL SULFATE HFA 108 (90 BASE) MCG/ACT IN AERS: 1.0000 | INHALATION_SPRAY | Freq: Four times a day (QID) | RESPIRATORY_TRACT | Status: DC | PRN

## 2015-05-28 MED ORDER — PHENYLEPHRINE HCL 10 MG/ML IJ SOLN
10.0000 mg | INTRAVENOUS | Status: DC | PRN
  Administered 2015-05-28: 50 ug/min via INTRAVENOUS

## 2015-05-28 MED ORDER — CLOPIDOGREL BISULFATE 75 MG PO TABS
75.0000 mg | ORAL_TABLET | Freq: Every day | ORAL | Status: DC
  Administered 2015-05-29 – 2015-06-09 (×12): 75 mg via NASOGASTRIC
  Filled 2015-05-28 (×12): qty 1

## 2015-05-28 MED ORDER — SODIUM CHLORIDE 0.9 % IV SOLN: 250.0000 mL | INTRAVENOUS | Status: DC | PRN

## 2015-05-28 MED ORDER — LIDOCAINE HCL (CARDIAC) 20 MG/ML IV SOLN
INTRAVENOUS | Status: DC | PRN
  Administered 2015-05-28: 100 mg via INTRAVENOUS

## 2015-05-28 MED ORDER — LIDOCAINE HCL (PF) 1 % IJ SOLN
INTRAMUSCULAR | Status: AC
  Filled 2015-05-28: qty 30

## 2015-05-28 MED ORDER — HEPARIN SODIUM (PORCINE) 1000 UNIT/ML IJ SOLN
INTRAMUSCULAR | Status: AC
  Filled 2015-05-28: qty 1

## 2015-05-28 MED ORDER — HEPARIN (PORCINE) IN NACL 100-0.45 UNIT/ML-% IJ SOLN
1200.0000 [IU]/h | INTRAMUSCULAR | Status: DC
  Administered 2015-05-29: 1200 [IU]/h via INTRAVENOUS
  Filled 2015-05-28 (×3): qty 250

## 2015-05-28 MED ORDER — FENTANYL BOLUS VIA INFUSION
50.0000 ug | INTRAVENOUS | Status: DC | PRN
  Administered 2015-06-08 (×2): 100 ug via INTRAVENOUS
  Filled 2015-05-28: qty 50

## 2015-05-28 MED ORDER — NITROGLYCERIN 1 MG/10 ML FOR IR/CATH LAB
INTRA_ARTERIAL | Status: DC | PRN
  Administered 2015-05-28: 22:00:00

## 2015-05-28 MED ORDER — ALBUMIN HUMAN 5 % IV SOLN
INTRAVENOUS | Status: AC
  Filled 2015-05-28: qty 250

## 2015-05-28 MED ORDER — DEXTROSE 5 % IV SOLN
0.0000 ug/min | INTRAVENOUS | Status: DC
  Administered 2015-05-28 – 2015-05-29 (×2): 200 ug/min via INTRAVENOUS
  Administered 2015-05-29: 300 ug/min via INTRAVENOUS
  Filled 2015-05-28 (×16): qty 1

## 2015-05-28 MED ORDER — CLOPIDOGREL BISULFATE 75 MG PO TABS
600.0000 mg | ORAL_TABLET | Freq: Once | ORAL | Status: AC
  Administered 2015-05-28: 600 mg via NASOGASTRIC
  Filled 2015-05-28: qty 8

## 2015-05-28 MED ORDER — TIROFIBAN (AGGRASTAT) BOLUS VIA INFUSION
INTRAVENOUS | Status: DC | PRN
  Administered 2015-05-28: 2982.5 ug via INTRAVENOUS

## 2015-05-28 MED ORDER — SODIUM CHLORIDE 0.9 % IJ SOLN
3.0000 mL | Freq: Two times a day (BID) | INTRAMUSCULAR | Status: DC
  Administered 2015-05-29 – 2015-06-03 (×12): 3 mL via INTRAVENOUS

## 2015-05-28 MED ORDER — MIDAZOLAM HCL 2 MG/2ML IJ SOLN
2.0000 mg | INTRAMUSCULAR | Status: DC | PRN
  Administered 2015-06-08 (×2): 2 mg via INTRAVENOUS

## 2015-05-28 MED ORDER — DEXAMETHASONE SODIUM PHOSPHATE 10 MG/ML IJ SOLN
INTRAMUSCULAR | Status: AC
  Filled 2015-05-28: qty 1

## 2015-05-28 MED ORDER — PROPOFOL 10 MG/ML IV BOLUS
INTRAVENOUS | Status: AC
  Filled 2015-05-28: qty 20

## 2015-05-28 MED ORDER — BIVALIRUDIN BOLUS VIA INFUSION - CUPID
INTRAVENOUS | Status: DC | PRN
  Administered 2015-05-28: 89.475 mg via INTRAVENOUS

## 2015-05-28 MED ORDER — LACTATED RINGERS IV SOLN
INTRAVENOUS | Status: DC | PRN
  Administered 2015-05-28 (×2): via INTRAVENOUS

## 2015-05-28 MED ORDER — HEPARIN (PORCINE) IN NACL 2-0.9 UNIT/ML-% IJ SOLN
INTRAMUSCULAR | Status: AC
Start: 2015-05-28 — End: 2015-05-28
  Filled 2015-05-28: qty 1000

## 2015-05-28 MED ORDER — TIROFIBAN HCL IV 12.5 MG/250 ML
INTRAVENOUS | Status: AC
  Filled 2015-05-28: qty 250

## 2015-05-28 MED ORDER — DEXTROSE 5 % IV SOLN
2.0000 g | INTRAVENOUS | Status: DC
  Administered 2015-05-28: 2 g via INTRAVENOUS
  Filled 2015-05-28 (×2): qty 2

## 2015-05-28 MED ORDER — DILTIAZEM HCL ER COATED BEADS 180 MG PO CP24
180.0000 mg | ORAL_CAPSULE | Freq: Every morning | ORAL | Status: DC
  Administered 2015-05-28: 180 mg via ORAL
  Filled 2015-05-28: qty 1

## 2015-05-28 MED ORDER — ATORVASTATIN CALCIUM 80 MG PO TABS
80.0000 mg | ORAL_TABLET | Freq: Every day | ORAL | Status: DC
  Administered 2015-05-28 – 2015-06-08 (×12): 80 mg via NASOGASTRIC
  Filled 2015-05-28 (×12): qty 1

## 2015-05-28 MED ORDER — SODIUM CHLORIDE 0.9 % IV BOLUS (SEPSIS)
1000.0000 mL | Freq: Once | INTRAVENOUS | Status: AC
  Administered 2015-05-28: 1000 mL via INTRAVENOUS

## 2015-05-28 MED ORDER — ACETAMINOPHEN 10 MG/ML IV SOLN
INTRAVENOUS | Status: AC
  Filled 2015-05-28: qty 100

## 2015-05-28 MED ORDER — SODIUM CHLORIDE 0.9 % IJ SOLN: 3.0000 mL | INTRAMUSCULAR | Status: DC | PRN

## 2015-05-28 MED ORDER — IOHEXOL 300 MG/ML  SOLN
25.0000 mL | Freq: Once | INTRAMUSCULAR | Status: AC | PRN
  Administered 2015-05-28: 25 mL via ORAL

## 2015-05-28 MED FILL — Medication: Qty: 2 | Status: AC

## 2015-05-28 SURGICAL SUPPLY — 27 items
BALLN EMERGE MR 3.0X15 (BALLOONS) ×3
BALLN EUPHORA RX 2.5X15 (BALLOONS) ×3
BALLN LINEAR 7.5FR IABP 40CC (BALLOONS) ×3
BALLN MAVERICK OTW 2.5X9 (BALLOONS) ×3
BALLN ~~LOC~~ TREK RX 5.0X15 (BALLOONS) ×3 IMPLANT
BALLOON EMERGE MR 3.0X15 (BALLOONS) ×1 IMPLANT
BALLOON EUPHORA RX 2.5X15 (BALLOONS) ×1 IMPLANT
BALLOON LINEAR 7.5FR IABP 40CC (BALLOONS) ×1 IMPLANT
BALLOON MAVERICK OTW 2.5X9 (BALLOONS) ×1 IMPLANT
CATH ANGIOJET SPIROFLEX (CATHETERS) ×3 IMPLANT
CATH INFINITI 5FR ANG PIGTAIL (CATHETERS) ×3 IMPLANT
CATH INFINITI 5FR JL4 (CATHETERS) ×3 IMPLANT
CATH INFINITI 5FR MULTPACK ANG (CATHETERS) IMPLANT
GUIDE CATH RUNWAY 6FR FR4 (CATHETERS) ×3 IMPLANT
KIT ENCORE 26 ADVANTAGE (KITS) ×3 IMPLANT
KIT HEART LEFT (KITS) ×3 IMPLANT
PACK CARDIAC CATHETERIZATION (CUSTOM PROCEDURE TRAY) ×3 IMPLANT
SHEATH PINNACLE 6F 10CM (SHEATH) ×6 IMPLANT
STENT REBEL MR 4.5X20 (Permanent Stent) ×3 IMPLANT
SYR MEDRAD MARK V 150ML (SYRINGE) ×3 IMPLANT
TRANSDUCER W/STOPCOCK (MISCELLANEOUS) ×3 IMPLANT
TUBING CIL FLEX 10 FLL-RA (TUBING) ×3 IMPLANT
WIRE ASAHI FIELDER XT 190CM (WIRE) ×3 IMPLANT
WIRE COUGAR XT STRL 190CM (WIRE) ×3 IMPLANT
WIRE EMERALD 3MM-J .035X150CM (WIRE) ×3 IMPLANT
WIRE HI TORQ WHISPER MS 190CM (WIRE) ×3 IMPLANT
WIRE RUNTHROUGH .014X300CM (WIRE) ×6 IMPLANT

## 2015-05-28 SURGICAL SUPPLY — 38 items
APPLIER CLIP 5 13 M/L LIGAMAX5 (MISCELLANEOUS)
APPLIER CLIP ROT 10 11.4 M/L (STAPLE)
APR CLP MED LRG 11.4X10 (STAPLE)
APR CLP MED LRG 5 ANG JAW (MISCELLANEOUS)
BAG SPEC RTRVL LRG 6X4 10 (ENDOMECHANICALS) ×2
CABLE HIGH FREQUENCY MONO STRZ (ELECTRODE) IMPLANT
CHLORAPREP W/TINT 26ML (MISCELLANEOUS) ×4 IMPLANT
CLIP APPLIE 5 13 M/L LIGAMAX5 (MISCELLANEOUS) IMPLANT
CLIP APPLIE ROT 10 11.4 M/L (STAPLE) IMPLANT
COVER SURGICAL LIGHT HANDLE (MISCELLANEOUS) IMPLANT
CUTTER FLEX LINEAR 45M (STAPLE) IMPLANT
DECANTER SPIKE VIAL GLASS SM (MISCELLANEOUS) ×4 IMPLANT
DRAPE LAPAROSCOPIC ABDOMINAL (DRAPES) ×4 IMPLANT
ELECT REM PT RETURN 9FT ADLT (ELECTROSURGICAL) ×4
ELECTRODE REM PT RTRN 9FT ADLT (ELECTROSURGICAL) ×2 IMPLANT
GAUZE SPONGE 2X2 8PLY STRL LF (GAUZE/BANDAGES/DRESSINGS) ×2 IMPLANT
GLOVE BIOGEL PI IND STRL 7.5 (GLOVE) ×2 IMPLANT
GLOVE BIOGEL PI INDICATOR 7.5 (GLOVE) ×2
GLOVE ECLIPSE 7.5 STRL STRAW (GLOVE) ×4 IMPLANT
GOWN STRL REUS W/TWL XL LVL3 (GOWN DISPOSABLE) ×8 IMPLANT
KIT BASIN OR (CUSTOM PROCEDURE TRAY) ×4 IMPLANT
LIQUID BAND (GAUZE/BANDAGES/DRESSINGS) IMPLANT
POUCH SPECIMEN RETRIEVAL 10MM (ENDOMECHANICALS) ×4 IMPLANT
RELOAD 45 VASCULAR/THIN (ENDOMECHANICALS) IMPLANT
RELOAD STAPLE TA45 3.5 REG BLU (ENDOMECHANICALS) IMPLANT
SCISSORS LAP 5X35 DISP (ENDOMECHANICALS) ×4 IMPLANT
SET IRRIG TUBING LAPAROSCOPIC (IRRIGATION / IRRIGATOR) ×4 IMPLANT
SHEARS HARMONIC ACE PLUS 36CM (ENDOMECHANICALS) ×4 IMPLANT
SLEEVE XCEL OPT CAN 5 100 (ENDOMECHANICALS) ×4 IMPLANT
SPONGE GAUZE 2X2 STER 10/PKG (GAUZE/BANDAGES/DRESSINGS) ×2
SUT MNCRL AB 4-0 PS2 18 (SUTURE) ×4 IMPLANT
TAPE CLOTH SURG 4X10 WHT LF (GAUZE/BANDAGES/DRESSINGS) ×4 IMPLANT
TOWEL OR 17X26 10 PK STRL BLUE (TOWEL DISPOSABLE) ×4 IMPLANT
TRAY FOLEY W/METER SILVER 14FR (SET/KITS/TRAYS/PACK) IMPLANT
TRAY FOLEY W/METER SILVER 16FR (SET/KITS/TRAYS/PACK) ×4 IMPLANT
TRAY LAPAROSCOPIC (CUSTOM PROCEDURE TRAY) ×4 IMPLANT
TROCAR BLADELESS OPT 5 100 (ENDOMECHANICALS) ×4 IMPLANT
TROCAR XCEL BLUNT TIP 100MML (ENDOMECHANICALS) ×4 IMPLANT

## 2015-05-28 NOTE — Progress Notes (Signed)
eLink Physician-Brief Progress Note Patient Name: Scott Holden DOB: [birth year] MRN: [id number]   Date of Service  06-08-15  HPI/Events of Note  Episode of VT. Cardiovert back to wide complex rhythm with a rate of 105.  eICU Interventions  Will order: 1. Bolus with lidocaine .75 mg/kg and increase infusion to 3 mg/hour.      Intervention Category Major Interventions: Arrhythmia - evaluation and management  Sommer,Steven Dennard Nip June 08, 2015, 11:37 PM

## 2015-05-28 NOTE — Discharge Instructions (Signed)
[facility] ______[facility] °LAPAROSCOPIC SURGERY: POST OP INSTRUCTIONS °Always review your discharge instruction sheet given to you by the facility where your surgery was performed. °IF YOU HAVE DISABILITY OR FAMILY LEAVE FORMS, YOU MUST BRING THEM TO THE OFFICE FOR PROCESSING.   °DO NOT GIVE THEM TO YOUR DOCTOR. ° °1. A prescription for pain medication may be given to you upon discharge.  Take your pain medication as prescribed, if needed.  If narcotic pain medicine is not needed, then you may take acetaminophen (Tylenol) or ibuprofen (Advil) as needed. °2. Take your usually prescribed medications unless otherwise directed. °3. If you need a refill on your pain medication, please contact your pharmacy.  They will contact our office to request authorization. Prescriptions will not be filled after 5pm or on week-ends. °4. You should follow a light diet the first few days after arrival home, such as soup and crackers, etc.  Be sure to include lots of fluids daily. °5. Most patients will experience some swelling and bruising in the area of the incisions.  Ice packs will help.  Swelling and bruising can take several days to resolve.  °6. It is common to experience some constipation if taking pain medication after surgery.  Increasing fluid intake and taking a stool softener (such as Colace) will usually help or prevent this problem from occurring.  A mild laxative (Milk of Magnesia or Miralax) should be taken according to package instructions if there are no bowel movements after 48 hours. °7. Unless discharge instructions indicate otherwise, you may remove your bandages 24-48 hours after surgery, and you may shower at that time.  You may have steri-strips (small skin tapes) in place directly over the incision.  These strips should be left on the skin for 7-10 days.  If your surgeon used skin glue on the incision, you may shower in 24 hours.  The glue will flake off over the next 2-3 weeks.  Any sutures or  staples will be removed at the office during your follow-up visit. °8. ACTIVITIES:  You may resume regular (light) daily activities beginning the next day--such as daily self-care, walking, climbing stairs--gradually increasing activities as tolerated.  You may have sexual intercourse when it is comfortable.  Refrain from any heavy lifting or straining until approved by your doctor. °a. You may drive when you are no longer taking prescription pain medication, you can comfortably wear a seatbelt, and you can safely maneuver your car and apply brakes. °b. RETURN TO WORK:  __________________________________________________________ °9. You should see your doctor in the office for a follow-up appointment approximately 2-3 weeks after your surgery.  Make sure that you call for this appointment within a day or two after you arrive home to insure a convenient appointment time. °10. OTHER INSTRUCTIONS: __________________________________________________________________________________________________________________________ __________________________________________________________________________________________________________________________ °WHEN TO CALL YOUR DOCTOR: °1. Fever over 101.0 °2. Inability to urinate °3. Continued bleeding from incision. °4. Increased pain, redness, or drainage from the incision. °5. Increasing abdominal pain ° °The clinic staff is available to answer your questions during regular business hours.  Please don’t hesitate to call and ask to speak to one of the nurses for clinical concerns.  If you have a medical emergency, go to the nearest emergency room or call 911.  A surgeon from [facility] is always on call at the hospital. °[address], [address], [place], [place]  [address] ? [address] [address], [place], [place]   [address] °[phone] ? [phone] ? FAX [phone] °Web site:   [email or web address] °

## 2015-05-28 NOTE — Progress Notes (Signed)
Vtach without a pulse at 2248. Shock x 1 with immediate return of pulses. CCM notified.

## 2015-05-28 NOTE — ED Notes (Signed)
Pt reminded of need for urine sample, will attempt to provide sample after EKG

## 2015-05-28 NOTE — H&P (Signed)
Scott Holden 09/15/1955  [id number].   Primary Care MD: Dr. Eileen Weston Chief Complaint/Reason for Consult: acute appendicitis HPI: This is a 58 yo white male with several medical problems including TIAs vs possible seizures, HTN, DM who began having some bloating yesterday morning.  He then developed some right sided abdominal pain. He has had multiple kidney stones and initially thought the pain was related to these stones.  However, as the day progressed, he developed some burning sensations and worsening pain. He felt this was different than his typical pain secondary to nephrolithiasis.  He developed some nausea as well.  He decided around 0300am this morning after he woke up again with worsening pain to come to [facility] for further evaluation.  He was noted to have a wBC of 19.5K and a CT that revealed acute appendicitis.  He was also noted to have an obstructing right ureteral stone with hydronephrosis.  Urology was consulted and felt he would be a good candidate for lithotripsy as an outpatient since his creatinine was normal.  We have been asked to evaluate the patient for further recommendations.  Of note, he takes aggrenox, but has been off of this for a week for a procedure he was supposed to have.  ROS : Please see HPI, otherwise currently negative  Family History  Problem Relation Age of Onset  . Cancer Maternal Grandfather     prostate  . Diabetes Mother   . Heart Problems Father     Past Medical History  Diagnosis Date  . Kidney calculi   . Hypertension   . History of TIAs   . Arthritis   . Back pain     trouble with lumbar 2, 3, and 4 - degenerative disease per pt  . Unspecified transient cerebral ischemia   . HA (headache)   . Syncope and collapse   . BPH (benign prostatic hyperplasia)   . GERD (gastroesophageal reflux disease)   . Diabetes mellitus without complication (HCC)   . Seizure (HCC)   . BPH (benign prostatic hyperplasia)   . Sarcoidosis of lung (HCC)   .  Tobacco abuse     Past Surgical History  Procedure Laterality Date  . Total hip arthroplasty    . Joint replacement    . Knee surgery    . Hernia repair  2003    Social History:  reports that he has been smoking Cigarettes.  He has a 10 pack-year smoking history. He has never used smokeless tobacco. He reports that he does not drink alcohol or use illicit drugs.  Allergies: No Known Allergies   (Not in a hospital admission)  Blood pressure 164/97, pulse 71, temperature 97.4 F (36.3 C), temperature source Oral, resp. rate 16, height 5' 10" (1.778 m), weight 119.296 kg (263 lb), SpO2 92 %. Physical Exam: General: pleasant, obese white male who is laying in bed in NAD HEENT: head is normocephalic, atraumatic.  Sclera are noninjected.  PERRL.  Ears and nose without any masses or lesions.  Mouth is pink and moist Heart: regular, rate, and rhythm, occasional skipped beats.  Normal s1,s2. No obvious murmurs, gallops, or rubs noted.  Palpable radial and pedal pulses bilaterally Lungs: CTAB, no wheezes, rhonchi, or rales noted.  Respiratory effort nonlabored Abd: soft, very tender in RMQ to RLQ, obese, +BS, no masses, hernias, or organomegaly MS: all 4 extremities are symmetrical with no cyanosis, clubbing, or edema. Skin: warm and dry with no masses, lesions, or rashes Psych: A&Ox3 with an   appropriate affect.    Results for orders placed or performed during the hospital encounter of 05/29/2015 (from the past 48 hour(s))  Lipase, blood     Status: None   Collection Time: 06/26/2015  4:45 AM  Result Value Ref Range   Lipase 21 11 - 51 U/L  Comprehensive metabolic panel     Status: Abnormal   Collection Time: 06/06/2015  4:45 AM  Result Value Ref Range   Sodium 138 135 - 145 mmol/L   Potassium 3.4 (L) 3.5 - 5.1 mmol/L   Chloride 101 101 - 111 mmol/L   CO2 30 22 - 32 mmol/L   Glucose, Bld 130 (H) 65 - 99 mg/dL   BUN 20 6 - 20 mg/dL   Creatinine, Ser 1.04 0.61 - 1.24 mg/dL   Calcium 9.1  8.9 - 10.3 mg/dL   Total Protein 7.1 6.5 - 8.1 g/dL   Albumin 4.1 3.5 - 5.0 g/dL   AST 20 15 - 41 U/L   ALT 17 17 - 63 U/L   Alkaline Phosphatase 86 38 - 126 U/L   Total Bilirubin 0.7 0.3 - 1.2 mg/dL   GFR calc non Af Amer >60 >60 mL/min   GFR calc Af Amer >60 >60 mL/min    Comment: (NOTE) The eGFR has been calculated using the CKD EPI equation. This calculation has not been validated in all clinical situations. eGFR's persistently <60 mL/min signify possible Chronic Kidney Disease.    Anion gap 7 5 - 15  CBC     Status: Abnormal   Collection Time: 06/06/2015  4:45 AM  Result Value Ref Range   WBC 19.5 (H) 4.0 - 10.5 K/uL   RBC 4.99 4.22 - 5.81 MIL/uL   Hemoglobin 14.9 13.0 - 17.0 g/dL   HCT 44.2 39.0 - 52.0 %   MCV 88.6 78.0 - 100.0 fL   MCH 29.9 26.0 - 34.0 pg   MCHC 33.7 30.0 - 36.0 g/dL   RDW 13.8 11.5 - 15.5 %   Platelets 239 150 - 400 K/uL  Urinalysis, Routine w reflex microscopic (not at [facility])     Status: Abnormal   Collection Time: 06/10/2015  7:30 AM  Result Value Ref Range   Color, Urine YELLOW YELLOW   APPearance CLOUDY (A) CLEAR   Specific Gravity, Urine 1.034 (H) 1.005 - 1.030   pH 6.5 5.0 - 8.0   Glucose, UA NEGATIVE NEGATIVE mg/dL   Hgb urine dipstick TRACE (A) NEGATIVE   Bilirubin Urine NEGATIVE NEGATIVE   Ketones, ur NEGATIVE NEGATIVE mg/dL   Protein, ur NEGATIVE NEGATIVE mg/dL   Nitrite NEGATIVE NEGATIVE   Leukocytes, UA SMALL (A) NEGATIVE  Urine microscopic-add on     Status: Abnormal   Collection Time: 06/05/2015  7:30 AM  Result Value Ref Range   Squamous Epithelial / LPF 0-5 (A) NONE SEEN   WBC, UA 6-30 0 - 5 WBC/hpf   RBC / HPF 0-5 0 - 5 RBC/hpf   Bacteria, UA RARE (A) NONE SEEN   Ct Abdomen Pelvis W Contrast  06/12/2015  CLINICAL DATA:  Right lower quadrant pain.  Leukocytosis. EXAM: CT ABDOMEN AND PELVIS WITH CONTRAST TECHNIQUE: Multidetector CT imaging of the abdomen and pelvis was performed using the standard protocol following bolus  administration of intravenous contrast. CONTRAST:  120m OMNIPAQUE IOHEXOL 300 MG/ML  SOLN COMPARISON:  12/03/2014 FINDINGS: There is enlargement and inflammation of the appendix with features typical of acute appendicitis. There is periappendiceal fluid, concerning for rupture but no frank  extraluminal air. There is a 6 x 9 mm right ureteral calculus at the L5 level with marked hydronephrosis. There is not a significant degree of periureteral and perinephric stranding, implying that this is not an acute ureteral obstruction. However, the ureteral calculus is clearly new compared to prior CT of 12/03/2014. There are multiple collecting system calculi in the left kidney measuring up to 3 mm. There are unremarkable appearances of the liver, gallbladder, bile ducts, pancreas, spleen and adrenals. Stomach and small bowel are unremarkable. The abdominal aorta is normal in caliber. There is mild atherosclerotic calcification. There is no adenopathy in the abdomen or pelvis. There is no significant abnormality in the lower chest. No significant musculoskeletal lesion. Lower lumbar facet arthropathy is present from L4 through S1. IMPRESSION: 1. Acute appendicitis. 2. 6 x 9 mm right ureteral calculus at the L5 level with marked hydronephrosis. This is recent but probably not acute. 3. Bilateral nephrolithiasis These results were called by telephone at the time of interpretation on 06/19/2015 at 6:47 am to Dr. COURTNEY HORTON , who verbally acknowledged these results. Electronically Signed   By: Daniel R Mitchell M.D.   On: 06/15/2015 06:48       Assessment/Plan 1. Acute appendicitis -admit, IVFs, prn meds for pain and nausea -Rocephin/Flagyl -aggrenox is already on hold.  Will hold on giving platelets prior to OR at this point -NPO for now until after surgery 2. Right ureteral stone with hydronephrosis -urology has seen and evaluated the patient and feels he would be a candidate for outpatient lithotripsy since  his creatinine was normal -follow up labs in am 3. HTN -resume home meds 4. DM -hold metformin while NPO -SSI 5. TIAs/Seizures -hold aggrenox -cont anti-seizure meds  OSBORNE,KELLY E 06/14/2015, 8:17 AM Pager: [phone]  

## 2015-05-28 NOTE — Anesthesia Postprocedure Evaluation (Signed)
Anesthesia Post Note  Patient: Scott Holden  Procedure(s) Performed: Procedure(s): LAPAROSCOPY DIAGNOSTIC  Patient location during evaluation: ICU Anesthesia Type: General Level of consciousness: obtunded/minimal responses and patient remains intubated per anesthesia plan Vital Signs Assessment: vitals unstable Respiratory status: patient on ventilator - see flowsheet for VS and respiratory function unstable Cardiovascular status: unstable and bradycardic Anesthetic complications: yes Comments: intraop cardiac arrest and resuscitation    Last Vitals:  Filed Vitals:   06/14/2015 1437 05/27/2015 1834  BP: 135/69 91/57  Pulse:  51  Temp: 39.2 C   Resp: 21 15    Last Pain:  Filed Vitals:   06/01/2015 1856  PainSc: 8                  Haille Pardi,JAMES TERRILL

## 2015-05-28 NOTE — Progress Notes (Signed)
Vtach without pulse at 2330. Shock x1 with immediate return of pulse. CCM MD notified.

## 2015-05-28 NOTE — Progress Notes (Signed)
Pt arrived from OR with pacer pads and intubated post code. No code blue in ICU. Pt arrived with Neo drip and Albumin running. Anesthesia, Cardiology MD, and Surgical MD all at bedside. [facility] aware of patients status prior to arrival to unit. Pt remained unstable and Code STEMI initiated. Cardiac Enzymes, Troponins, CBC and CMP sent to lab STAT. Critical lab value of Troponin 0.65 resulted and reported to MD at beside. 50 mcg of Fentanyl and 2mg  versed given per MD prior to transport due to agitiation. Levophed initiated and transported on 10 mcg with Neo at 50 mcg. Telephone report given to [facility] prior to arrival. Cardiology MD and RN updated family. Pt transferred to [facility]. Extra bag of levophed sent with [facility].

## 2015-05-28 NOTE — Anesthesia Preprocedure Evaluation (Addendum)
Anesthesia Evaluation  Patient identified by MRN, date of birth, ID band Patient awake    Reviewed: Allergy & Precautions, NPO status , Patient's Chart, lab work & pertinent test results  History of Anesthesia Complications Negative for: history of anesthetic complications  Airway Mallampati: II  TM Distance: >3 FB Neck ROM: Full    Dental  (+) Edentulous Upper, Edentulous Lower   Pulmonary sleep apnea , COPD, Current Smoker,    breath sounds clear to auscultation       Cardiovascular hypertension, + dysrhythmias  Rhythm:Regular Rate:Tachycardia     Neuro/Psych  Headaches,    GI/Hepatic GERD  ,  Endo/Other  diabetesMorbid obesity  Renal/GU Renal disease     Musculoskeletal  (+) Arthritis ,   Abdominal (+) + obese,   Peds  Hematology negative hematology ROS (+)   Anesthesia Other Findings   Reproductive/Obstetrics                           Anesthesia Physical Anesthesia Plan  ASA: III and emergent  Anesthesia Plan: General   Post-op Pain Management:    Induction: Intravenous  Airway Management Planned: Oral ETT  Additional Equipment:   Intra-op Plan:   Post-operative Plan: Extubation in OR  Informed Consent: I have reviewed the patients History and Physical, chart, labs and discussed the procedure including the risks, benefits and alternatives for the proposed anesthesia with the patient or authorized representative who has indicated his/her understanding and acceptance.   Dental advisory given  Plan Discussed with: CRNA and Surgeon  Anesthesia Plan Comments:         Anesthesia Quick Evaluation

## 2015-05-28 NOTE — Op Note (Signed)
Preoperative Diagnosis: Kidney stone [N20.0] Acute appendicitis with localized peritonitis [K35.3]  Postoprative Diagnosis: Kidney stone [N20.0] Acute appendicitis with localized peritonitis [K35.3]  Procedure: Procedure(s): Laparoscopy, appendectomy aborted due to cardiac arrest   Surgeon: Glenna Fellows T   Assistants: none  Anesthesia:  General endotracheal anesthesia  Indications: patient is a 59 year old male who presents with 2 days of persistent worsening right lower quadrant abdominal pain. CT scan showed evidence of acute appendicitis as well as a right hydroureter. His physical exam and elevated white count  And CT findings are all consistent with severe acute appendicitis as a cause for his presentation.  We have recommended proceeding with laparoscopic appendectomy. I discussed the nature of the procedure and its indications as well as alternatives and risks of anesthetic complication, bleeding, infection or visceral injury. He is in agreement to proceed.    Procedure Detail:  Patient was brought to operating room 1, placed in the supine position on the operating table, and general endotracheal anesthesia induced. He was already on broad-spectrum IV antibiotics. Foley catheter was placed. Soon after induction the patient was noted to be in a partial heart block. Dr. Jacklynn Bue was in constant attendance and the patient appeared stable and due to his severe abdominal problem he was felt to be best to proceed with surgery. Access was attained with an Hassan trocar in the  Supraumbilical midline due to obesity through a 1/2 cm incision and a mattress suture of  0 Vicryl and pneumoperitoneum established. Under direct vision 5 mm trochars were placed in the left lower quadrant and in the right upper abdomen. Immediately apparent was significant inflammatory change and exudate in the right lower quadrant.  The appendix was identified lying anterior to the cecum and appeared necrotic but  not obviously perforated. There was no fecal contamination.  I began to mobilize the lateral peritoneal attachments to the cecum and appendix with the Harmonic scalpel. The patient had been stable to this point but then went into sudden V. Tach and V. Fib.  The procedure was aborted and pneumoperitoneum evacuated. CPR was immediately begun and resuscitation was immediately begun directed by Dr. Jacklynn Bue.  Details of the resuscitation were recorded by anesthesia. We were able to regain a regular rhythm and adequate blood pressure and following this I secured the mattress suture at the supraumbilical incision and closed the incisions with staples. I did not feel it was in any way safe to continue with the procedure at this point. Cardiology and critical care were consulted and the patient is transferred to the intensive care unit. Treatment for now will be continued with broad-spectrum antibiotics while his cardiac issues are being addressed. This was discussed with  The patient's family on several occasions during the process and they were kept completely informed.    Findings: As above  Estimated Blood Loss:  Minimal         Drains: none  Blood Given: none          Specimens: one        Complications:  Intraoperative cardiac arrest         Disposition: ICU - intubated and critically ill.         Condition: unstable

## 2015-05-28 NOTE — Consult Note (Signed)
CARDIOLOGY INTERVENTIONAL CARDIOLOGY CONSULTATION  NAME:  Scott Holden   MRN: [id number] DOB:  Mar 07, 1956   ADMIT DATE: 06/04/2015  Reason for Consult: Perioperative Cardiac Arrest with CPR & Combined Inferior / Anterior STEMI  Requesting Physician: Dr. Excell Seltzer (Surgery), Dr. Orene Desanctis  Primary Cardiologist: New to [facility]  HPI: This is a 59 y.o. 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  He also has a history of TIAs and seizure.  He presented to [facility] early morning 06/25/2015 with right-sided abdominal pain. Initially felt that this pain was thought related to his kidney stones, however it became clear that the when it was not getting better at 3 AM he came into the emergency room found have a high white blood cell count 19.5 and a CT scan revealing acute appendicitis. He also noted to have right ureteral stone and hydronephrosis -potentially a candidate for lithotripsy.  He was seen in consultation by Dr. Excell Seltzer from surgery who planned laparoscopic appendectomy. Of note his baseline EKG has left anterior fascicular block and right bundle branch block. He had multiple comorbidities, but no cardiac history by his report or by history. 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 Dr. Orene Desanctis felt this was stable enough rhythm and Be managed with titration of rate control if necessary. He felt it was safe to proceed with the operation. With introduction of the first trocar into the abdomen to visualize the right lower quadrant showing a gangrenous appendix with no overt sign of rupture. However, with this the patient went acutely into ventricular tachycardia followed by ventricular fibrillation requiring CPR. The procedure was obviously aborted and the pneumoperitoneum evacuated. Anesthesiology a meal he  performed CPR and he required 2 shocks with epinephrine with restoration of sinus rhythm that then became clear to be complete heart block with strain 13 mm ST elevations in lead 3 and roughly 10 mm in lead 2 and aVF with also 5-6 mm elevations in V1 and V2 V3 symmetric and elevations in V4 through V6. Reciprocal changes in 1 and aVL.  Initial stabilization and closure of the suture sites, the patient was brought to the [facility] ICU.  We were called for consultation and I saw the patient on arrival to the ICU at [facility]. It was clearly using complete heart block with what appears to be in acute inferior possible anterolateral ST elevation MI. After discussion with Dr. Excell Seltzer we decided that the appendix to potentially be managed with IV antibiotics and stabilization with potential delayed percutaneous drainage. He felt like cardiac stabilization did take precedence. He was okay with [place] proceeding with regulation. Therefore we decided to call code STEMI and the patient was transported to [facility] cardiac catheterization lab for urgent catheterization with temperature pacemaker placement by Dr. Angelena Form.  I had also had a conversation with Dr. Angelena Form awaiting for the surgeon to arrive to ICU. He was aware of the patient's presentation and we agreed that is clearly a significant MI and would require acute vascular sedation if okay with surgery.  Of note the patient upon arrival to the unit was on 50 mcg/m of Neo-Synephrine as well as 5 mg/m of Levophed. This was titrated up to 10 mcg/m for increased heart rate control and blood pressure.  He was given 2 mg Versed and 50 pg of fentanyl in the ICU  prior to being laced on the stretcher for [facility] transport to [facility].  I personally met with the patient's family to discuss his presentation and prognosis. I discussed the plan for urgent catheterization and concerns with possible bleeding related to the surgery and appendix in addition to his  obvious cardiac risk. Questions were answered.  Emergency consent was implied, however the patient's family was in complete agreement with proceeding.  Time of the EKG was 1856. There was clear delay based on the fact the patient was Intra-Op at the time of his cardiac arrest and presentation with inferior MI and is also the need to transport to ICU for stabilization and then he is to the safety of catheterization and PCI necrotic appendix. There was in the transport time to [facility]. Right distal AV still felt that the most appropriate course of action would be to proceed to cath lab.   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 No cardiac history by patient report. Past Medical History  Diagnosis Date  . Kidney calculi   . Hypertension   . History of TIAs   . Arthritis   . Back pain     trouble with lumbar 2, 3, and 4 - degenerative disease per pt  . Unspecified transient cerebral ischemia   . HA (headache)   . Syncope and collapse   . BPH (benign prostatic hyperplasia)   . GERD (gastroesophageal reflux disease)   . Diabetes mellitus without complication ([place])   . Seizure ([place])   . BPH (benign prostatic hyperplasia)   . Sarcoidosis of lung ([place])   . Tobacco abuse    Past Surgical History  Procedure Laterality Date  . Total hip arthroplasty    . Joint replacement    . Knee surgery    . Hernia repair  2003    FAMHx: Family History  Problem Relation Age of Onset  . Cancer Maternal Grandfather     prostate  . Diabetes Mother   . Heart Problems Father     SOCHx:  reports that he has been smoking Cigarettes.  He has a 10 pack-year smoking history. He has never used smokeless tobacco. He reports that he does not drink alcohol or use illicit drugs.  ALLERGIES: No Known Allergies  ROS: Unable to obtain as the patient was intubated and sedated Preop review of systems was obtained from his H&P   HOME  MEDICATIONS: Prescriptions prior to admission  Medication Sig Dispense Refill Last Dose  . albuterol (PROVENTIL HFA;VENTOLIN HFA) 108 (90 BASE) MCG/ACT inhaler Inhale 1-2 puffs into the lungs every 6 (six) hours as needed for wheezing. 1 Inhaler 0 Past Month at Unknown time  . diltiazem (CARDIZEM CD) 180 MG 24 hr capsule Take 1 capsule (180 mg total) by mouth every morning. 30 capsule 0 Past Week at Unknown time  . dipyridamole-aspirin (AGGRENOX) 200-25 MG per 12 hr capsule Take 1 capsule by mouth 2 (two) times daily. 60 capsule 6 Past Week at Unknown time  . HYDROcodone-acetaminophen (NORCO/VICODIN) 5-325 MG per tablet Take 1 tablet by mouth every 6 (six) hours as needed. (Patient taking differently: Take 1 tablet by mouth every 6 (six) hours as needed for moderate pain. ) 30 tablet 0 05/27/2015 at Unknown time  . ibuprofen (ADVIL,MOTRIN) 200 MG tablet Take 600 mg by mouth every 6 (six) hours as needed for moderate pain.   05/27/2015 at Unknown time  . ipratropium-albuterol (DUONEB) 0.5-2.5 (3) MG/3ML SOLN  Take 3 mLs by nebulization every 4 (four) hours as needed (For shortness of breath). 360 mL 3 Past Month at Unknown time  . lamoTRIgine (LAMICTAL) 25 MG tablet TAKE 3 TABLETS BY MOUTH TWICE A DAY FOR 1 WEEK THEN TAKE 4 TABLETS BY MOUTH TWICE A DAY (Patient taking differently: take 4 tablets by mouth twice a day.) 240 tablet 3 Past Week at Unknown time  . loratadine (CLARITIN) 10 MG tablet Take 10 mg by mouth every morning.   Past Week at Unknown time  . metFORMIN (GLUCOPHAGE) 500 MG tablet Take 1 tablet by mouth 2 (two) times daily.  4 Past Week at Unknown time  . omeprazole (PRILOSEC) 40 MG capsule Take 40 mg by mouth every morning.    Past Week at Unknown time  . traMADol (ULTRAM) 50 MG tablet Take 50 mg by mouth 3 (three) times daily as needed for moderate pain.   05/27/2015 at Unknown time    HOSPITAL MEDICATIONS: Scheduled Meds: . [MAR Hold] cefTRIAXone (ROCEPHIN)  IV  2 g Intravenous Q24H    And  . [MAR Hold] metronidazole  500 mg Intravenous Q8H  . [MAR Hold] diltiazem  180 mg Oral q morning - 10a  . fentaNYL      . fentaNYL (SUBLIMAZE) injection  50 mcg Intravenous Once  . [MAR Hold] Influenza vac split quadrivalent PF  0.5 mL Intramuscular Tomorrow-1000  . [MAR Hold] insulin aspart  0-9 Units Subcutaneous TID WC  . [MAR Hold] lamoTRIgine  100 mg Oral BID  . midazolam      . [MAR Hold] pantoprazole  40 mg Oral Daily   Continuous Infusions: . 0.9 % NaCl with KCl 20 mEq / L 100 mL/hr at 06/06/2015 1043  . bivalirudin (ANGIOMAX) infusion 5 mg/mL 1.75 mg/kg/hr (05/30/2015 2003)  . fentaNYL infusion INTRAVENOUS    . midazolam (VERSED) infusion    . [MAR Hold] norepinephrine (LEVOPHED) Adult infusion     PRN Meds:.bivalirudin (ANGIOMAX) infusion 5 mg/mL, bivalirudin, [MAR Hold] diphenhydrAMINE **OR** [MAR Hold] diphenhydrAMINE, [MAR Hold] fentaNYL, fentaNYL, [MAR Hold] ipratropium-albuterol, [MAR Hold] midazolam, [MAR Hold] midazolam, midazolam, [MAR Hold]  morphine injection, [MAR Hold] ondansetron **OR** [MAR Hold] ondansetron (ZOFRAN) IV, [MAR Hold] oxyCODONE, [MAR Hold] simethicone  VITALS: Blood pressure 91/57, pulse 51, temperature 102.6 F (39.2 C), temperature source Oral, resp. rate 15, height 5' 10"  (1.778 m), weight 263 lb (119.296 kg), SpO2 99 %.  PHYSICAL EXAM: General appearance: Intubated. Mostly sedated, but did get agitated with transport to stretcher. Neck: no adenopathy, no carotid bruit, no JVD and supple, symmetrical, trachea midline Lungs: clear to auscultation bilaterally and normal percussion bilaterally Heart: Bradycardic rate. Very distant S1 and S2. Did not hear any M/ R/G due to body habitus. Abdomen: Obese. Nondistended. Unable to determine if there is any tenderness. No HSM palpable. I did not push hard due to the appendicitis. Extremities: extremities normal, atraumatic, no cyanosis or edema Pulses: 2+ and symmetric Skin: Skin color, texture, turgor  normal. No rashes or lesions Neurologic: Mental status: Intubated and sedated  LABS: Results for orders placed or performed during the hospital encounter of 06/15/2015 (from the past 24 hour(s))  Lipase, blood     Status: None   Collection Time: 06/03/2015  4:45 AM  Result Value Ref Range   Lipase 21 11 - 51 U/L  Comprehensive metabolic panel     Status: Abnormal   Collection Time: 06/19/2015  4:45 AM  Result Value Ref Range   Sodium 138 135 - 145  mmol/L   Potassium 3.4 (L) 3.5 - 5.1 mmol/L   Chloride 101 101 - 111 mmol/L   CO2 30 22 - 32 mmol/L   Glucose, Bld 130 (H) 65 - 99 mg/dL   BUN 20 6 - 20 mg/dL   Creatinine, Ser 1.04 0.61 - 1.24 mg/dL   Calcium 9.1 8.9 - 10.3 mg/dL   Total Protein 7.1 6.5 - 8.1 g/dL   Albumin 4.1 3.5 - 5.0 g/dL   AST 20 15 - 41 U/L   ALT 17 17 - 63 U/L   Alkaline Phosphatase 86 38 - 126 U/L   Total Bilirubin 0.7 0.3 - 1.2 mg/dL   GFR calc non Af Amer >60 >60 mL/min   GFR calc Af Amer >60 >60 mL/min   Anion gap 7 5 - 15  CBC     Status: Abnormal   Collection Time: 06/01/2015  4:45 AM  Result Value Ref Range   WBC 19.5 (H) 4.0 - 10.5 K/uL   RBC 4.99 4.22 - 5.81 MIL/uL   Hemoglobin 14.9 13.0 - 17.0 g/dL   HCT 44.2 39.0 - 52.0 %   MCV 88.6 78.0 - 100.0 fL   MCH 29.9 26.0 - 34.0 pg   MCHC 33.7 30.0 - 36.0 g/dL   RDW 13.8 11.5 - 15.5 %   Platelets 239 150 - 400 K/uL  Urinalysis, Routine w reflex microscopic (not at [facility])     Status: Abnormal   Collection Time: 06/23/2015  7:30 AM  Result Value Ref Range   Color, Urine YELLOW YELLOW   APPearance CLOUDY (A) CLEAR   Specific Gravity, Urine 1.034 (H) 1.005 - 1.030   pH 6.5 5.0 - 8.0   Glucose, UA NEGATIVE NEGATIVE mg/dL   Hgb urine dipstick TRACE (A) NEGATIVE   Bilirubin Urine NEGATIVE NEGATIVE   Ketones, ur NEGATIVE NEGATIVE mg/dL   Protein, ur NEGATIVE NEGATIVE mg/dL   Nitrite NEGATIVE NEGATIVE   Leukocytes, UA SMALL (A) NEGATIVE  Urine microscopic-add on     Status: Abnormal   Collection Time: 06/21/2015   7:30 AM  Result Value Ref Range   Squamous Epithelial / LPF 0-5 (A) NONE SEEN   WBC, UA 6-30 0 - 5 WBC/hpf   RBC / HPF 0-5 0 - 5 RBC/hpf   Bacteria, UA RARE (A) NONE SEEN  Glucose, capillary     Status: None   Collection Time: 06/15/2015 12:23 PM  Result Value Ref Range   Glucose-Capillary 98 65 - 99 mg/dL  Blood gas, arterial     Status: Abnormal   Collection Time: 06/08/2015  6:30 PM  Result Value Ref Range   FIO2 1.00    Delivery systems VENTILATOR    Mode PRESSURE REGULATED VOLUME CONTROL    VT 580 mL   LHR 14 resp/min   Peep/cpap 0 cm H20   pH, Arterial 7.224 (L) 7.350 - 7.450   pCO2 arterial 55.6 (H) 35.0 - 45.0 mmHg   pO2, Arterial 164 (H) 80.0 - 100.0 mmHg   Bicarbonate 22.1 20.0 - 24.0 mEq/L   TCO2 20.5 0 - 100 mmol/L   Acid-base deficit 5.8 (H) 0.0 - 2.0 mmol/L   O2 Saturation 98.4 %   Patient temperature 98.6    Collection site A-LINE    Drawn by [id number]    Sample type ARTERIAL DRAW    Allens test (pass/fail) PASS PASS  CBC with Differential/Platelet     Status: Abnormal   Collection Time: 06/02/2015  6:52 PM  Result Value Ref  Range   WBC 17.1 (H) 4.0 - 10.5 K/uL   RBC 4.45 4.22 - 5.81 MIL/uL   Hemoglobin 13.4 13.0 - 17.0 g/dL   HCT 40.8 39.0 - 52.0 %   MCV 91.7 78.0 - 100.0 fL   MCH 30.1 26.0 - 34.0 pg   MCHC 32.8 30.0 - 36.0 g/dL   RDW 14.0 11.5 - 15.5 %   Platelets 209 150 - 400 K/uL   Neutrophils Relative % 90 %   Neutro Abs 15.4 (H) 1.7 - 7.7 K/uL   Lymphocytes Relative 8 %   Lymphs Abs 1.4 0.7 - 4.0 K/uL   Monocytes Relative 2 %   Monocytes Absolute 0.4 0.1 - 1.0 K/uL   Eosinophils Relative 0 %   Eosinophils Absolute 0.0 0.0 - 0.7 K/uL   Basophils Relative 0 %   Basophils Absolute 0.0 0.0 - 0.1 K/uL  Troponin I (q 6hr x 3)     Status: Abnormal   Collection Time: 06/22/2015  6:52 PM  Result Value Ref Range   Troponin I 0.65 (HH) <0.031 ng/mL  Comprehensive metabolic panel     Status: Abnormal   Collection Time: 06/16/2015  6:52 PM  Result Value Ref  Range   Sodium 137 135 - 145 mmol/L   Potassium 3.1 (L) 3.5 - 5.1 mmol/L   Chloride 107 101 - 111 mmol/L   CO2 24 22 - 32 mmol/L   Glucose, Bld 132 (H) 65 - 99 mg/dL   BUN 19 6 - 20 mg/dL   Creatinine, Ser 1.51 (H) 0.61 - 1.24 mg/dL   Calcium 11.1 (H) 8.9 - 10.3 mg/dL   Total Protein 5.5 (L) 6.5 - 8.1 g/dL   Albumin 3.0 (L) 3.5 - 5.0 g/dL   AST 25 15 - 41 U/L   ALT 16 (L) 17 - 63 U/L   Alkaline Phosphatase 60 38 - 126 U/L   Total Bilirubin 1.3 (H) 0.3 - 1.2 mg/dL   GFR calc non Af Amer 49 (L) >60 mL/min   GFR calc Af Amer 57 (L) >60 mL/min   Anion gap 6 5 - 15    IMAGING: Ct Abdomen Pelvis W Contrast  06/22/2015  CLINICAL DATA:  Right lower quadrant pain.  Leukocytosis. EXAM: CT ABDOMEN AND PELVIS WITH CONTRAST TECHNIQUE: Multidetector CT imaging of the abdomen and pelvis was performed using the standard protocol following bolus administration of intravenous contrast. CONTRAST:  156m OMNIPAQUE IOHEXOL 300 MG/ML  SOLN COMPARISON:  12/03/2014 FINDINGS: There is enlargement and inflammation of the appendix with features typical of acute appendicitis. There is periappendiceal fluid, concerning for rupture but no frank extraluminal air. There is a 6 x 9 mm right ureteral calculus at the L5 level with marked hydronephrosis. There is not a significant degree of periureteral and perinephric stranding, implying that this is not an acute ureteral obstruction. However, the ureteral calculus is clearly new compared to prior CT of 12/03/2014. There are multiple collecting system calculi in the left kidney measuring up to 3 mm. There are unremarkable appearances of the liver, gallbladder, bile ducts, pancreas, spleen and adrenals. Stomach and small bowel are unremarkable. The abdominal aorta is normal in caliber. There is mild atherosclerotic calcification. There is no adenopathy in the abdomen or pelvis. There is no significant abnormality in the lower chest. No significant musculoskeletal lesion. Lower  lumbar facet arthropathy is present from L4 through S1. IMPRESSION: 1. Acute appendicitis. 2. 6 x 9 mm right ureteral calculus at the L5 level with marked hydronephrosis.  This is recent but probably not acute. 3. Bilateral nephrolithiasis These results were called by telephone at the time of interpretation on 06/11/2015 at 6:47 am to Dr. Thayer Jew , who verbally acknowledged these results. Electronically Signed   By: Andreas Newport M.D.   On: 06/19/2015 06:48    IMPRESSION: Acute inferior MI with possible anterior-apical involvement Cardiac arrest/VT/VF Complete heart block Acute appendicitis Circulatory shock. Unclear if cardiogenic or potentially septic  RECOMMENDATION: After long discussion with Dr. Excell Seltzer and Dr. Angelena Form, we decided the best course of action was to transport directly to cardiac catheterization at [facility].  Obvious delays in arrival to the Cath Lab noted above.  We will have care assisting with management of ventilator, and the surgical service available for managing antibiotics and other treatment of the appendicitis.  Plan for PCI would be to use bare metal stent to  Critical care Time Spent Directly with Patient: 36 minutes  Brehanna Deveny, Leonie Green, M.D., M.S. Interventional Cardiologist   Pager # [phone]

## 2015-05-28 NOTE — Progress Notes (Signed)
Vtach without pulse at 2327. Shock x1 with immediate return of pulse. MD notified

## 2015-05-28 NOTE — Progress Notes (Signed)
Patient transported from Cath Lab to 2H09 with no complications.

## 2015-05-28 NOTE — Anesthesia Procedure Notes (Signed)
Procedure Name: Intubation Date/Time: 13-Jun-2015 4:49 PM Performed by: Orest Dikes Pre-anesthesia Checklist: Patient identified, Emergency Drugs available, Suction available and Patient being monitored Patient Re-evaluated:Patient Re-evaluated prior to inductionOxygen Delivery Method: Circle System Utilized Preoxygenation: Pre-oxygenation with 100% oxygen Intubation Type: IV induction, Rapid sequence and Cricoid Pressure applied Laryngoscope Size: Mac and 4 Grade View: Grade I Tube type: Oral Tube size: 7.5 mm Number of attempts: 1 Airway Equipment and Method: Stylet Placement Confirmation: ETT inserted through vocal cords under direct vision,  positive ETCO2 and breath sounds checked- equal and bilateral Secured at: 21 cm Tube secured with: Tape Dental Injury: Teeth and Oropharynx as per pre-operative assessment

## 2015-05-28 NOTE — ED Notes (Signed)
Surgery at bedside.

## 2015-05-28 NOTE — ED Notes (Signed)
Patient transported to CT 

## 2015-05-28 NOTE — ED Notes (Signed)
Pt. Made aware for the need of urine. 

## 2015-05-28 NOTE — ED Notes (Signed)
EKG given to EDP, Horton,MD., for review. 

## 2015-05-28 NOTE — Consult Note (Signed)
ANTICOAGULATION CONSULT NOTE - Initial Consult  Pharmacy Consult for Heparin Indication: IABP  No Known Allergies  Patient Measurements: Height: 5\' 10"  (177.8 cm) Weight: 263 lb (119.296 kg) IBW/kg (Calculated) : 73 Heparin Dosing Weight: ~99.5kg  Vital Signs: Temp: 102.6 F (39.2 C) (12/02 1437) Temp Source: Oral (12/02 1437) BP: 121/77 mmHg (12/02 2135) Pulse Rate: 51 (12/02 1834)  Labs:  Recent Labs  06/15/2015 0445 06/18/2015 1852 06/06/2015 1853  HGB 14.9 13.4  --   HCT 44.2 40.8  --   PLT 239 209  --   CREATININE 1.04 1.51*  --   CKTOTAL  --   --  49  CKMB  --   --  8.9*  TROPONINI  --  0.65*  --     Estimated Creatinine Clearance: 68.2 mL/min (by C-G formula based on Cr of 1.51).   Medical History: Past Medical History  Diagnosis Date  . Kidney calculi   . Hypertension   . History of TIAs   . Arthritis   . Back pain     trouble with lumbar 2, 3, and 4 - degenerative disease per pt  . Unspecified transient cerebral ischemia   . HA (headache)   . Syncope and collapse   . BPH (benign prostatic hyperplasia)   . GERD (gastroesophageal reflux disease)   . Diabetes mellitus without complication ([facility])   . Seizure (HCC)   . BPH (benign prostatic hyperplasia)   . Sarcoidosis of lung (H[facility])   . Tobacco abuse    Assessment: 58yom presented to [facility] with acute appendicitis and was taken to the OR for a laparoscopic appendectomy. In the OR he went into cardiac arrest. EKG showed CHB and ST elevations so Code STEMI was called. He was transferred to [facility] and taken emergently to the cath lab where he received a BMS to his RCA, temporary pacemaker and IABP were placed. He will begin heparin. He has also been continued on aggrastat.   Goal of Therapy:  Heparin level 0.2-0.5 units/ml Monitor platelets by anticoagulation protocol: Yes   Plan:  1) Begin heparin at 1200 units/hr with no bolus 2) Check 6 hour heparin level 3) Daily heparin level and  CBC  Fredrik Rigger 06/08/2015,10:12 PM

## 2015-05-28 NOTE — ED Notes (Signed)
Dr. Horton at bedside. 

## 2015-05-28 NOTE — ED Provider Notes (Signed)
CSN: [id number]     Arrival date & time 06/06/2015  0425 History   First MD Initiated Contact with Patient 06/19/2015 0448     Chief Complaint  Patient presents with  . Abdominal Pain     (Consider location/radiation/quality/duration/timing/severity/associated sxs/prior Treatment) HPI  This is a 59 yo history of kidney stones, hypertension, TIA who presents with right lower quadrant pain. Patient reports indolent and worsening right lower quadrant pain. He states that he has gotten worse and it is sharp and burning. Initially he thought it was a kidney stone; however, he states that it is much different than his kidney stone pain. He reports nausea. Denies diarrhea. Reports anorexia. Denies fever. He still has his appendix.    Current pain is 9 out of 10. He has not taken anything for the pain. Denies dysuria. Reports baseline hematuria.  Past Medical History  Diagnosis Date  . Kidney calculi   . Hypertension   . History of TIAs   . Arthritis   . Back pain     trouble with lumbar 2, 3, and 4 - degenerative disease per pt  . Unspecified transient cerebral ischemia   . HA (headache)   . Syncope and collapse   . BPH (benign prostatic hyperplasia)   . GERD (gastroesophageal reflux disease)   . Diabetes mellitus without complication (HCC)   . Seizure (HCC)   . BPH (benign prostatic hyperplasia)   . Sarcoidosis of lung (HCC)   . Tobacco abuse    Past Surgical History  Procedure Laterality Date  . Total hip arthroplasty    . Joint replacement    . Knee surgery    . Hernia repair  2003   Family History  Problem Relation Age of Onset  . Cancer Maternal Grandfather     prostate  . Diabetes Mother   . Heart Problems Father    Social History  Substance Use Topics  . Smoking status: Current Some Day Smoker -- 0.50 packs/day for 20 years    Types: Cigarettes  . Smokeless tobacco: Never Used     Comment: three cigarettes daily  . Alcohol Use: No    Review of Systems   Constitutional: Negative.  Negative for fever.  Respiratory: Negative.  Negative for chest tightness and shortness of breath.   Cardiovascular: Negative.  Negative for chest pain.  Gastrointestinal: Positive for nausea and abdominal pain. Negative for vomiting, diarrhea and constipation.  Genitourinary: Positive for hematuria. Negative for dysuria.  Musculoskeletal: Negative for back pain.  All other systems reviewed and are negative.     Allergies  Review of patient's allergies indicates no known allergies.  Home Medications   Prior to Admission medications   Medication Sig Start Date End Date Taking? Authorizing Provider  albuterol (PROVENTIL HFA;VENTOLIN HFA) 108 (90 BASE) MCG/ACT inhaler Inhale 1-2 puffs into the lungs every 6 (six) hours as needed for wheezing. 11/27/12  Yes Derwood Kaplan, MD  diltiazem (CARDIZEM CD) 180 MG 24 hr capsule Take 1 capsule (180 mg total) by mouth every morning. 12/05/14  Yes Penny Pia, MD  dipyridamole-aspirin (AGGRENOX) 200-25 MG per 12 hr capsule Take 1 capsule by mouth 2 (two) times daily. 01/15/14  Yes Nilda Riggs, NP  HYDROcodone-acetaminophen (NORCO/VICODIN) 5-325 MG per tablet Take 1 tablet by mouth every 6 (six) hours as needed. Patient taking differently: Take 1 tablet by mouth every 6 (six) hours as needed for moderate pain.  12/05/14  Yes Penny Pia, MD  ibuprofen (ADVIL,MOTRIN) 200 [id number]  Take 600 mg by mouth every 6 (six) hours as needed for moderate pain.   Yes Historical Provider, MD  ipratropium-albuterol (DUONEB) 0.5-2.5 (3) MG/3ML SOLN Take 3 mLs by nebulization every 4 (four) hours as needed (For shortness of breath). 01/15/13  Yes Laveda Norman, MD  lamoTRIgine (LAMICTAL) 25 MG tablet TAKE 3 TABLETS BY MOUTH TWICE A DAY FOR 1 WEEK THEN TAKE 4 TABLETS BY MOUTH TWICE A DAY Patient taking differently: take 4 tablets by mouth twice a day. 03/17/14  Yes Nilda Riggs, NP  loratadine (CLARITIN) 10 MG tablet Take 10 mg by  mouth every morning.   Yes Historical Provider, MD  metFORMIN (GLUCOPHAGE) 500 MG tablet Take 1 tablet by mouth 2 (two) times daily. 10/22/14  Yes Historical Provider, MD  omeprazole (PRILOSEC) 40 MG capsule Take 40 mg by mouth every morning.    Yes Historical Provider, MD  traMADol (ULTRAM) 50 MG tablet Take 50 mg by mouth 3 (three) times daily as needed for moderate pain.   Yes Historical Provider, MD  ciprofloxacin (CIPRO) 500 MG tablet Take 1 tablet (500 mg total) by mouth 2 (two) times daily. Patient not taking: Reported on 06/06/2015 12/05/14   Penny Pia, MD  fluticasone [facility]) 50 MCG/ACT nasal spray Place 2 sprays into the nose daily. Patient not taking: Reported on 11/11/2014 04/04/13   Huston Foley, MD  ondansetron (ZOFRAN ODT) 8 MG disintegrating tablet Take 1 tablet (8 mg total) by mouth every 8 (eight) hours as needed for nausea. Patient not taking: Reported on 06/14/2015 11/06/14   Derwood Kaplan, MD  oxyCODONE (ROXICODONE) 5 MG immediate release tablet Take 1 tablet (5 mg total) by mouth every 6 (six) hours as needed for severe pain. Patient not taking: Reported on 12/02/2014 11/11/14   Roxy Horseman, PA-C  tamsulosin (FLOMAX) 0.4 MG CAPS capsule Take 1 capsule (0.4 mg total) by mouth daily. Patient not taking: Reported on 11/11/2014 11/06/14   Derwood Kaplan, MD   BP 173/103 mmHg  Pulse 65  Temp(Src) 97.4 F (36.3 C) (Oral)  Resp 16  Ht 5\' 10"  (1.778 m)  Wt 263 lb (119.296 kg)  BMI 37.74 kg/m2  SpO2 95% Physical Exam  Constitutional: He is oriented to person, place, and time. He appears well-developed and well-nourished.  Uncomfortable appearing, obese  HENT:  Head: Normocephalic and atraumatic.  Cardiovascular: Normal rate, regular rhythm and normal heart sounds.   No murmur heard. Pulmonary/Chest: Effort normal and breath sounds normal. No respiratory distress. He has no wheezes.  Abdominal: Soft. Bowel sounds are normal. There is tenderness. There is no rebound and no  guarding.  Right lower quadrant tenderness to palpation without rebound or guarding, negative Rovsing's  Musculoskeletal: He exhibits no edema.  Neurological: He is alert and oriented to person, place, and time.  Skin: Skin is warm and dry.  Psychiatric: He has a normal mood and affect.  Nursing note and vitals reviewed.   ED Course  Procedures (including critical care time) Labs Review Labs Reviewed  COMPREHENSIVE METABOLIC PANEL - Abnormal; Notable for the following:    Potassium 3.4 (*)    Glucose, Bld 130 (*)    All other components within normal limits  CBC - Abnormal; Notable for the following:    WBC 19.5 (*)    All other components within normal limits  LIPASE, BLOOD  URINALYSIS, ROUTINE W REFLEX MICROSCOPIC (NOT AT [facility])    Imaging Review Ct Abdomen Pelvis W Contrast  05/27/2015  CLINICAL DATA:  Right  lower quadrant pain.  Leukocytosis. EXAM: CT ABDOMEN AND PELVIS WITH CONTRAST TECHNIQUE: Multidetector CT imaging of the abdomen and pelvis was performed using the standard protocol following bolus administration of intravenous contrast. CONTRAST:  OMNIPAQUE IOHEXOL 300 MG/ML  SOLN COMPARISON:  12/03/2014 FINDINGS: There is enlargement and inflammation of the appendix with features typical of acute appendicitis. There is periappendiceal fluid, concerning for rupture but no frank extraluminal air. There is a 6 x 9 mm right ureteral calculus at the L5 level with marked hydronephrosis. There is not a significant degree of periureteral and perinephric stranding, implying that this is not an acute ureteral obstruction. However, the ureteral calculus is clearly new compared to prior CT of 12/03/2014. There are multiple collecting system calculi in the left kidney measuring up to 3 mm. There are unremarkable appearances of the liver, gallbladder, bile ducts, pancreas, spleen and adrenals. Stomach and small bowel are unremarkable. The abdominal aorta is normal in caliber. There is mild  atherosclerotic calcification. There is no adenopathy in the abdomen or pelvis. There is no significant abnormality in the lower chest. No significant musculoskeletal lesion. Lower lumbar facet arthropathy is present from L4 through S1. IMPRESSION: 1. Acute appendicitis. 2. 6 x 9 mm right ureteral calculus at the L5 level with marked hydronephrosis. This is recent but probably not acute. 3. Bilateral nephrolithiasis These results were called by telephone at the time of interpretation on 06/02/2015 at 6:47 am to Dr. Ross Marcus , who verbally acknowledged these results. Electronically Signed   By: Ellery Plunk M.D.   On: 06/02/2015 06:48   I have personally reviewed and evaluated these images and lab results as part of my medical decision-making.   EKG Interpretation   Date/Time:  Friday May 28 2015 06:58:26 EST Ventricular Rate:  67 PR Interval:  181 QRS Duration: 146 QT Interval:  589 QTC Calculation: 622 R Axis:   -70 Text Interpretation:  Sinus rhythm Ventricular premature complex RBBB and  LAFB Confirmed by HORTON  MD, COURTNEY ([id number]) on 06/16/2015 7:16:31 AM      MDM   Final diagnoses:  Acute appendicitis with localized peritonitis  Kidney stone    Patient presents with right lower quadrant pain. Reports indolent onset and atypical for his kidney stone pain. He is tender on exam. He is otherwise nontoxic and afebrile. Patient was given pain and nausea medication. Nothing by mouth status. Initial lab work notable for a white blood cell count of 19,000. CT scan of the abdomen pelvis with contrast obtained. Patient has evidence of acute appendicitis.  He also has a 6 x 9 mm right ureteral stone that appears subacute. Given her physical exam findings, suspect appendicitis as the cause of the patient's acute pain. Surgery consulted.  Discussed with Dr. Abbey Chatters. Will give patient antibiotics (Zosyn because shortage of cefepime).  Given kidney stone as well, urology was  consulted. Given that is likely subacute, Dr. Vernie Ammons said there is nothing to do at this point. Patient will need to recover from his acute appendicitis first.  Shon Baton, MD 06/13/2015 [phone]

## 2015-05-28 NOTE — ED Notes (Signed)
Patient states he has RLQ abd pain. Patient states he initially thought it was a kidney stone, but states this is different. Patient reports being "very queasy & weak". Patient states the pain is a burning pain, denies emesis, denies diarrhea.

## 2015-05-28 NOTE — Progress Notes (Addendum)
Spoke to urology, patient will need to go home on flomax 0.4mg  daily at discharge and follow up with his primary urologist.  I have sent this Rx to the pharmacy already.  Milania Haubner E 06/02/2015 1:00 PM

## 2015-05-28 NOTE — ED Notes (Signed)
Report given to Rachel, RN.

## 2015-05-28 NOTE — Transfer of Care (Signed)
Immediate Anesthesia Transfer of Care Note  Patient: Scott Holden  Procedure(s) Performed: Procedure(s): LAPAROSCOPY DIAGNOSTIC  Patient Location: ICU  Anesthesia Type:General  Level of Consciousness: unresponsive  Airway & Oxygen Therapy: Patient placed on Ventilator (see vital sign flow sheet for setting)  Post-op Assessment: Report given to RN  Post vital signs: unstable  Last Vitals:  Filed Vitals:   05/27/2015 1437 06/13/2015 1834  BP: 135/69 91/57  Pulse:  51  Temp: 39.2 C   Resp: 21 15    Complications: cardiovascular complications

## 2015-05-29 ENCOUNTER — Inpatient Hospital Stay (HOSPITAL_COMMUNITY): Payer: Medicaid Other

## 2015-05-29 ENCOUNTER — Other Ambulatory Visit (HOSPITAL_COMMUNITY): Payer: Medicaid Other

## 2015-05-29 DIAGNOSIS — I442 Atrioventricular block, complete: Secondary | ICD-10-CM

## 2015-05-29 DIAGNOSIS — I472 Ventricular tachycardia: Secondary | ICD-10-CM

## 2015-05-29 DIAGNOSIS — K3580 Unspecified acute appendicitis: Secondary | ICD-10-CM

## 2015-05-29 DIAGNOSIS — J9601 Acute respiratory failure with hypoxia: Secondary | ICD-10-CM

## 2015-05-29 DIAGNOSIS — R570 Cardiogenic shock: Secondary | ICD-10-CM

## 2015-05-29 DIAGNOSIS — I2510 Atherosclerotic heart disease of native coronary artery without angina pectoris: Secondary | ICD-10-CM

## 2015-05-29 LAB — CBC
HEMATOCRIT: 42.2 % (ref 39.0–52.0)
HEMOGLOBIN: 13.5 g/dL (ref 13.0–17.0)
MCH: 29.3 pg (ref 26.0–34.0)
MCHC: 32 g/dL (ref 30.0–36.0)
MCV: 91.5 fL (ref 78.0–100.0)
Platelets: 189 10*3/uL (ref 150–400)
RBC: 4.61 MIL/uL (ref 4.22–5.81)
RDW: 14.2 % (ref 11.5–15.5)
WBC: 21.4 10*3/uL — AB (ref 4.0–10.5)

## 2015-05-29 LAB — BLOOD GAS, ARTERIAL
ACID-BASE DEFICIT: 5.4 mmol/L — AB (ref 0.0–2.0)
BICARBONATE: 19.4 meq/L — AB (ref 20.0–24.0)
FIO2: 0.8
LHR: 30 {breaths}/min
O2 SAT: 94 %
PATIENT TEMPERATURE: 98.6
PCO2 ART: 37.7 mmHg (ref 35.0–45.0)
PEEP: 8 cmH2O
PH ART: 7.333 — AB (ref 7.350–7.450)
TCO2: 20.6 mmol/L (ref 0–100)
VT: 590 mL
pO2, Arterial: 72.6 mmHg — ABNORMAL LOW (ref 80.0–100.0)

## 2015-05-29 LAB — BASIC METABOLIC PANEL
ANION GAP: 10 (ref 5–15)
ANION GAP: 10 (ref 5–15)
ANION GAP: 15 (ref 5–15)
BUN: 16 mg/dL (ref 6–20)
BUN: 18 mg/dL (ref 6–20)
BUN: 20 mg/dL (ref 6–20)
CALCIUM: 11.4 mg/dL — AB (ref 8.9–10.3)
CALCIUM: 6.6 mg/dL — AB (ref 8.9–10.3)
CHLORIDE: 106 mmol/L (ref 101–111)
CO2: 19 mmol/L — ABNORMAL LOW (ref 22–32)
CO2: 22 mmol/L (ref 22–32)
CO2: 23 mmol/L (ref 22–32)
CREATININE: 1.49 mg/dL — AB (ref 0.61–1.24)
Calcium: 7.8 mg/dL — ABNORMAL LOW (ref 8.9–10.3)
Chloride: 98 mmol/L — ABNORMAL LOW (ref 101–111)
Chloride: 96 mmol/L — ABNORMAL LOW (ref 101–111)
Creatinine, Ser: 2.24 mg/dL — ABNORMAL HIGH (ref 0.61–1.24)
Creatinine, Ser: 1.94 mg/dL — ABNORMAL HIGH (ref 0.61–1.24)
GFR calc Af Amer: 35 mL/min — ABNORMAL LOW (ref >60)
GFR calc non Af Amer: 50 mL/min — ABNORMAL LOW (ref >60)
GFR, EST AFRICAN AMERICAN: 42 mL/min — AB (ref >60)
GFR, EST AFRICAN AMERICAN: 58 mL/min — AB (ref >60)
GFR, EST NON AFRICAN AMERICAN: 30 mL/min — AB (ref >60)
GFR, EST NON AFRICAN AMERICAN: 36 mL/min — AB (ref >60)
GLUCOSE: 207 mg/dL — AB (ref 65–99)
Glucose, Bld: 128 mg/dL — ABNORMAL HIGH (ref 65–99)
Glucose, Bld: 311 mg/dL — ABNORMAL HIGH (ref 65–99)
POTASSIUM: 3.3 mmol/L — AB (ref 3.5–5.1)
POTASSIUM: 4.1 mmol/L (ref 3.5–5.1)
Potassium: 3.3 mmol/L — ABNORMAL LOW (ref 3.5–5.1)
SODIUM: 132 mmol/L — AB (ref 135–145)
SODIUM: 138 mmol/L (ref 135–145)
Sodium: 129 mmol/L — ABNORMAL LOW (ref 135–145)

## 2015-05-29 LAB — MAGNESIUM
MAGNESIUM: 1.7 mg/dL (ref 1.7–2.4)
MAGNESIUM: 1.8 mg/dL (ref 1.7–2.4)

## 2015-05-29 LAB — POCT I-STAT, CHEM 8
BUN: 20 mg/dL (ref 6–20)
CALCIUM ION: 1.1 mmol/L — AB (ref 1.12–1.23)
Chloride: 94 mmol/L — ABNORMAL LOW (ref 101–111)
Creatinine, Ser: 1.9 mg/dL — ABNORMAL HIGH (ref 0.61–1.24)
GLUCOSE: 312 mg/dL — AB (ref 65–99)
HCT: 44 % (ref 39.0–52.0)
HEMOGLOBIN: 15 g/dL (ref 13.0–17.0)
Potassium: 3.3 mmol/L — ABNORMAL LOW (ref 3.5–5.1)
Sodium: 133 mmol/L — ABNORMAL LOW (ref 135–145)
TCO2: 19 mmol/L (ref 0–100)

## 2015-05-29 LAB — POCT I-STAT 3, ART BLOOD GAS (G3+)
Acid-base deficit: 13 mmol/L — ABNORMAL HIGH (ref 0.0–2.0)
Acid-base deficit: 10 mmol/L — ABNORMAL HIGH (ref 0.0–2.0)
BICARBONATE: 16.5 meq/L — AB (ref 20.0–24.0)
Bicarbonate: 17.5 mEq/L — ABNORMAL LOW (ref 20.0–24.0)
O2 SAT: 86 %
O2 Saturation: 94 %
PCO2 ART: 51.7 mmHg — AB (ref 35.0–45.0)
PCO2 ART: 43.4 mmHg (ref 35.0–45.0)
Patient temperature: 100.7
TCO2: 18 mmol/L (ref 0–100)
TCO2: 19 mmol/L (ref 0–100)
pH, Arterial: 7.118 — CL (ref 7.350–7.450)
pH, Arterial: 7.214 — ABNORMAL LOW (ref 7.350–7.450)
pO2, Arterial: 73 mmHg — ABNORMAL LOW (ref 80.0–100.0)
pO2, Arterial: 85 mmHg (ref 80.0–100.0)

## 2015-05-29 LAB — CG4 I-STAT (LACTIC ACID): LACTIC ACID, VENOUS: 6.95 mmol/L — AB (ref 0.5–2.0)

## 2015-05-29 LAB — GLUCOSE, CAPILLARY
GLUCOSE-CAPILLARY: 171 mg/dL — AB (ref 65–99)
Glucose-Capillary: 224 mg/dL — ABNORMAL HIGH (ref 65–99)
Glucose-Capillary: 222 mg/dL — ABNORMAL HIGH (ref 65–99)
Glucose-Capillary: 187 mg/dL — ABNORMAL HIGH (ref 65–99)
Glucose-Capillary: 160 mg/dL — ABNORMAL HIGH (ref 65–99)

## 2015-05-29 LAB — HEPARIN LEVEL (UNFRACTIONATED): HEPARIN UNFRACTIONATED: 0.11 [IU]/mL — AB (ref 0.30–0.70)

## 2015-05-29 LAB — MRSA PCR SCREENING: MRSA by PCR: POSITIVE — AB

## 2015-05-29 LAB — CARBOXYHEMOGLOBIN
CARBOXYHEMOGLOBIN: 1 % (ref 0.5–1.5)
METHEMOGLOBIN: 1 % (ref 0.0–1.5)
O2 Saturation: 79.5 %
Total hemoglobin: 13.6 g/dL (ref 13.5–18.0)

## 2015-05-29 LAB — LACTIC ACID, PLASMA
LACTIC ACID, VENOUS: 6.9 mmol/L — AB (ref 0.5–2.0)
LACTIC ACID, VENOUS: 3.9 mmol/L — AB (ref 0.5–2.0)

## 2015-05-29 LAB — TROPONIN I: Troponin I: >65 ng/mL (ref <0.031)

## 2015-05-29 MED ORDER — PERFLUTREN LIPID MICROSPHERE
INTRAVENOUS | Status: AC
  Administered 2015-05-29: 5 mL via INTRAVENOUS
  Filled 2015-05-29: qty 10

## 2015-05-29 MED ORDER — POTASSIUM CHLORIDE 10 MEQ/50ML IV SOLN
INTRAVENOUS | Status: AC
  Administered 2015-05-29: 10 meq
  Filled 2015-05-29: qty 150

## 2015-05-29 MED ORDER — POTASSIUM CHLORIDE 10 MEQ/100ML IV SOLN: 10.0000 meq | INTRAVENOUS | Status: DC

## 2015-05-29 MED ORDER — POTASSIUM CHLORIDE 10 MEQ/100ML IV SOLN: 10.0000 meq | INTRAVENOUS | Status: AC

## 2015-05-29 MED ORDER — SODIUM CHLORIDE 0.9 % IV SOLN
1.0000 g | Freq: Three times a day (TID) | INTRAVENOUS | Status: DC
  Administered 2015-05-29 – 2015-06-02 (×12): 1 g via INTRAVENOUS
  Filled 2015-05-29 (×13): qty 1

## 2015-05-29 MED ORDER — AMIODARONE LOAD VIA INFUSION
150.0000 mg | Freq: Once | INTRAVENOUS | Status: AC
Start: 2015-05-29 — End: 2015-05-29
  Administered 2015-05-29: 150 mg via INTRAVENOUS

## 2015-05-29 MED ORDER — SODIUM CHLORIDE 0.9 % IV SOLN
INTRAVENOUS | Status: DC
  Administered 2015-05-29 – 2015-05-30 (×4): via INTRAVENOUS

## 2015-05-29 MED ORDER — CHLORHEXIDINE GLUCONATE CLOTH 2 % EX PADS
6.0000 | MEDICATED_PAD | Freq: Every day | CUTANEOUS | Status: AC
  Administered 2015-05-29 – 2015-06-02 (×5): 6 via TOPICAL

## 2015-05-29 MED ORDER — SODIUM BICARBONATE 8.4 % IV SOLN
50.0000 meq | Freq: Once | INTRAVENOUS | Status: AC
  Administered 2015-05-29: 50 meq via INTRAVENOUS

## 2015-05-29 MED ORDER — HEPARIN (PORCINE) IN NACL 100-0.45 UNIT/ML-% IJ SOLN
2100.0000 [IU]/h | INTRAMUSCULAR | Status: DC
  Administered 2015-05-29: 1850 [IU]/h via INTRAVENOUS
  Administered 2015-05-30: 2100 [IU]/h via INTRAVENOUS
  Filled 2015-05-29 (×2): qty 250

## 2015-05-29 MED ORDER — CALCIUM GLUCONATE 10 % IV SOLN
1.0000 g | Freq: Once | INTRAVENOUS | Status: AC
  Administered 2015-05-29: 1 g via INTRAVENOUS
  Filled 2015-05-29: qty 10

## 2015-05-29 MED ORDER — CHLORHEXIDINE GLUCONATE CLOTH 2 % EX PADS
6.0000 | MEDICATED_PAD | Freq: Every day | CUTANEOUS | Status: DC
  Administered 2015-05-30 – 2015-06-09 (×10): 6 via TOPICAL

## 2015-05-29 MED ORDER — STERILE WATER FOR INJECTION IV SOLN
INTRAVENOUS | Status: DC
  Administered 2015-05-29 – 2015-05-31 (×11): via INTRAVENOUS
  Filled 2015-05-29 (×22): qty 850

## 2015-05-29 MED ORDER — PERFLUTREN LIPID MICROSPHERE
1.0000 mL | INTRAVENOUS | Status: AC | PRN
  Administered 2015-05-29: 5 mL via INTRAVENOUS
  Filled 2015-05-29: qty 10

## 2015-05-29 MED ORDER — PHENYLEPHRINE HCL 10 MG/ML IJ SOLN
0.0000 ug/min | INTRAVENOUS | Status: DC
  Administered 2015-05-29: 400 ug/min via INTRAVENOUS
  Administered 2015-05-29: 200 ug/min via INTRAVENOUS
  Administered 2015-05-29: 60 ug/min via INTRAVENOUS
  Administered 2015-05-29: 350 ug/min via INTRAVENOUS
  Filled 2015-05-29 (×6): qty 4

## 2015-05-29 MED ORDER — CHLORHEXIDINE GLUCONATE 0.12% ORAL RINSE (MEDLINE KIT)
15.0000 mL | Freq: Two times a day (BID) | OROMUCOSAL | Status: DC
  Administered 2015-05-29 – 2015-06-09 (×23): 15 mL via OROMUCOSAL

## 2015-05-29 MED ORDER — MILRINONE IN DEXTROSE 20 MG/100ML IV SOLN
0.2500 ug/kg/min | INTRAVENOUS | Status: DC
  Administered 2015-05-29 – 2015-05-30 (×4): 0.25 ug/kg/min via INTRAVENOUS
  Filled 2015-05-29 (×4): qty 100

## 2015-05-29 MED ORDER — LACTATED RINGERS IV BOLUS (SEPSIS): 1000.0000 mL | Freq: Once | INTRAVENOUS | Status: DC

## 2015-05-29 MED ORDER — POTASSIUM CHLORIDE 10 MEQ/50ML IV SOLN
INTRAVENOUS | Status: AC
Start: 2015-05-29 — End: 2015-05-29
  Administered 2015-05-29: 10 meq
  Filled 2015-05-29: qty 50

## 2015-05-29 MED ORDER — PANTOPRAZOLE SODIUM 40 MG IV SOLR
40.0000 mg | INTRAVENOUS | Status: DC
  Administered 2015-05-29 – 2015-05-31 (×3): 40 mg via INTRAVENOUS
  Filled 2015-05-29 (×3): qty 40

## 2015-05-29 MED ORDER — ASPIRIN 81 MG PO CHEW
81.0000 mg | CHEWABLE_TABLET | Freq: Every day | ORAL | Status: DC
  Administered 2015-05-30 – 2015-06-09 (×11): 81 mg
  Filled 2015-05-29 (×11): qty 1

## 2015-05-29 MED ORDER — MAGNESIUM SULFATE 2 GM/50ML IV SOLN
INTRAVENOUS | Status: AC
  Administered 2015-05-29: 2 g via INTRAVENOUS
  Filled 2015-05-29: qty 50

## 2015-05-29 MED ORDER — LACTATED RINGERS IV BOLUS (SEPSIS)
1000.0000 mL | INTRAVENOUS | Status: DC | PRN
Start: 2015-05-29 — End: 2015-05-29

## 2015-05-29 MED ORDER — MAGNESIUM SULFATE IN D5W 10-5 MG/ML-% IV SOLN
1.0000 g | Freq: Once | INTRAVENOUS | Status: AC
Start: 2015-05-29 — End: 2015-05-29
  Administered 2015-05-29: 1 g via INTRAVENOUS
  Filled 2015-05-29: qty 100

## 2015-05-29 MED ORDER — DOPAMINE-DEXTROSE 3.2-5 MG/ML-% IV SOLN
0.0000 ug/kg/min | INTRAVENOUS | Status: DC
  Administered 2015-05-29: 800 mg via INTRAVENOUS
  Administered 2015-05-30: 5 ug/kg/min via INTRAVENOUS
  Filled 2015-05-29 (×3): qty 250

## 2015-05-29 MED ORDER — DOPAMINE-DEXTROSE 3.2-5 MG/ML-% IV SOLN
INTRAVENOUS | Status: AC
  Administered 2015-05-29: 800 mg via INTRAVENOUS
  Filled 2015-05-29: qty 250

## 2015-05-29 MED ORDER — MAGNESIUM SULFATE 2 GM/50ML IV SOLN: 2.0000 g | Freq: Once | INTRAVENOUS | Status: AC

## 2015-05-29 MED ORDER — SODIUM BICARBONATE 8.4 % IV SOLN
100.0000 meq | Freq: Once | INTRAVENOUS | Status: AC
  Administered 2015-05-29: 100 meq via INTRAVENOUS

## 2015-05-29 MED ORDER — LACTATED RINGERS IV SOLN
INTRAVENOUS | Status: DC
  Administered 2015-05-29 (×2): via INTRAVENOUS

## 2015-05-29 MED ORDER — MAGNESIUM SULFATE 2 GM/50ML IV SOLN
2.0000 g | Freq: Once | INTRAVENOUS | Status: AC
Start: 2015-05-29 — End: 2015-05-29
  Administered 2015-05-29: 2 g via INTRAVENOUS

## 2015-05-29 MED ORDER — SODIUM CHLORIDE 0.9 % IV BOLUS (SEPSIS)
1000.0000 mL | Freq: Once | INTRAVENOUS | Status: AC
  Administered 2015-05-29: 1000 mL via INTRAVENOUS

## 2015-05-29 MED ORDER — POTASSIUM CHLORIDE 10 MEQ/50ML IV SOLN
10.0000 meq | INTRAVENOUS | Status: AC
  Administered 2015-05-29 (×3): 10 meq via INTRAVENOUS
  Filled 2015-05-29: qty 50

## 2015-05-29 MED ORDER — POTASSIUM CHLORIDE 10 MEQ/50ML IV SOLN
10.0000 meq | INTRAVENOUS | Status: AC
  Administered 2015-05-29 (×2): 10 meq via INTRAVENOUS

## 2015-05-29 MED ORDER — SODIUM CHLORIDE 0.9 % IV SOLN
0.0300 [IU]/min | INTRAVENOUS | Status: DC
  Administered 2015-05-29 – 2015-05-31 (×4): 0.03 [IU]/min via INTRAVENOUS
  Filled 2015-05-29 (×5): qty 2

## 2015-05-29 MED ORDER — LACTATED RINGERS IV BOLUS (SEPSIS)
1000.0000 mL | INTRAVENOUS | Status: DC | PRN
  Administered 2015-05-29: 1000 mL via INTRAVENOUS
  Filled 2015-05-29: qty 1000

## 2015-05-29 MED ORDER — LACTATED RINGERS IV BOLUS (SEPSIS)
1000.0000 mL | Freq: Once | INTRAVENOUS | Status: AC
  Administered 2015-05-29: 1000 mL via INTRAVENOUS

## 2015-05-29 MED ORDER — ANTISEPTIC ORAL RINSE SOLUTION (CORINZ)
7.0000 mL | OROMUCOSAL | Status: DC
  Administered 2015-05-29 – 2015-06-09 (×116): 7 mL via OROMUCOSAL

## 2015-05-29 MED ORDER — MUPIROCIN 2 % EX OINT
1.0000 "application " | TOPICAL_OINTMENT | Freq: Two times a day (BID) | CUTANEOUS | Status: AC
  Administered 2015-05-29 – 2015-06-02 (×10): 1 via NASAL
  Filled 2015-05-29: qty 22

## 2015-05-29 MED ORDER — POTASSIUM CHLORIDE 10 MEQ/50ML IV SOLN
INTRAVENOUS | Status: AC
  Administered 2015-05-29: 10 meq
  Filled 2015-05-29: qty 50

## 2015-05-29 MED ORDER — HYDROCORTISONE NA SUCCINATE PF 100 MG IJ SOLR
100.0000 mg | Freq: Three times a day (TID) | INTRAMUSCULAR | Status: DC
  Administered 2015-05-29 – 2015-06-06 (×26): 100 mg via INTRAVENOUS
  Filled 2015-05-29 (×26): qty 2

## 2015-05-29 MED ORDER — INSULIN ASPART 100 UNIT/ML ~~LOC~~ SOLN
0.0000 [IU] | SUBCUTANEOUS | Status: DC
  Administered 2015-05-29 – 2015-05-31 (×14): 4 [IU] via SUBCUTANEOUS
  Administered 2015-06-01 (×3): 3 [IU] via SUBCUTANEOUS
  Administered 2015-06-01 (×2): 4 [IU] via SUBCUTANEOUS
  Administered 2015-06-01 – 2015-06-02 (×2): 3 [IU] via SUBCUTANEOUS
  Administered 2015-06-02: 4 [IU] via SUBCUTANEOUS
  Administered 2015-06-02: 3 [IU] via SUBCUTANEOUS
  Administered 2015-06-02: 4 [IU] via SUBCUTANEOUS
  Administered 2015-06-02 – 2015-06-04 (×5): 3 [IU] via SUBCUTANEOUS
  Administered 2015-06-04: 4 [IU] via SUBCUTANEOUS
  Administered 2015-06-04 – 2015-06-07 (×18): 3 [IU] via SUBCUTANEOUS
  Administered 2015-06-08 (×5): 4 [IU] via SUBCUTANEOUS
  Administered 2015-06-09: 3 [IU] via SUBCUTANEOUS
  Administered 2015-06-09 (×2): 4 [IU] via SUBCUTANEOUS

## 2015-05-29 MED ORDER — VANCOMYCIN HCL 10 G IV SOLR
2500.0000 mg | Freq: Once | INTRAVENOUS | Status: AC
  Administered 2015-05-29: 2500 mg via INTRAVENOUS
  Filled 2015-05-29: qty 2500

## 2015-05-29 MED ORDER — MAGNESIUM SULFATE 2 GM/50ML IV SOLN
INTRAVENOUS | Status: AC
  Administered 2015-05-29: 2 g
  Filled 2015-05-29: qty 50

## 2015-05-29 MED ORDER — MAGNESIUM SULFATE 2 GM/50ML IV SOLN: 2.0000 g | Freq: Once | INTRAVENOUS | Status: DC

## 2015-05-29 MED ORDER — SODIUM CHLORIDE 0.9 % IV SOLN
1.0000 g | Freq: Once | INTRAVENOUS | Status: AC
  Administered 2015-05-29: 1 g via INTRAVENOUS
  Filled 2015-05-29: qty 1

## 2015-05-29 MED ORDER — VANCOMYCIN HCL 10 G IV SOLR
1250.0000 mg | INTRAVENOUS | Status: DC
  Administered 2015-05-30 – 2015-06-02 (×4): 1250 mg via INTRAVENOUS
  Filled 2015-05-29 (×4): qty 1250

## 2015-05-29 MED ORDER — SODIUM BICARBONATE 8.4 % IV SOLN
INTRAVENOUS | Status: AC
Start: 2015-05-29 — End: 2015-05-29
  Administered 2015-05-29: 50 meq
  Filled 2015-05-29: qty 200

## 2015-05-29 NOTE — Progress Notes (Addendum)
Cardiology Progress Note:  Mr. 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  At this point most interventions will be trial and error as no single intervention will address all problems and several may even exacerbate others.  By Issue:  VT/Frequent PVCs:  - Would recommend suppressing with lidocaine gtt and intubation and sedation.  If his pressures can tolerate propofol this would be a better ectopy suppression than fent/versed, however unlikely to be the case - Would d/c amiodarone due to frequent torsades. May need to re-initiate this medication if more frequent PVCs/VT off amiodarone   Torsades: - Long QT likely from ischemia - It is possible to overdive pace from temp wire, though this would introduce intraventricular dyssyncrhony in a patient who is hemodynamically unstable. - reports RV pacing was arrhythmogenic in cath lab ==> will hold on attempts at over drive pacing - Agree with treating with aggressive mag repletion - Recommend trial of d/c'ing amiodarone  RV Infarct/Sepsis - Aggressive IV fluids, Start with CVP of 15, may need higher  Pressor recommendations: - Surviving Sepsis guidelines recommends Epinephrine as second lines pressor in septic shock, however, this is strongly arrythmogenic.  Would consider adding dopamine first.  If still dropping pressures with addition of dopamine, agree with Epinephrine addition - Agree with stress dose steroids  Last: would consider ECMO given age and likely reversible causes  Critically Ill patient.    Addendum:  Patient now on 4 pressors, worsening acidemia. We have updated family on grim prognosis. Appreciate surgery input on whether he would benefit from a perc drain for attempt at source control.  His Central Venous Sat is in the 70s, but his extremities are ice cold.  Will aggressively treat for RV  infarct and attempt to raise CVP to 25 with aggressive fluids.

## 2015-05-29 NOTE — Progress Notes (Addendum)
eLink Physician-Brief Progress Note Patient Name: Scott Holden DOB: Oct 02, 1955 MRN: [id number]   Date of Service  05/29/2015  HPI/Events of Note  Bigeminy, VF/Torsades Mg 1.8  eICU Interventions  1 gm already ordered by Dr. Katrinka Blazing     Intervention Category Intermediate Interventions: Electrolyte abnormality - evaluation and management  Samayah Novinger 05/29/2015, 5:06 PM

## 2015-05-29 NOTE — Progress Notes (Signed)
Echocardiogram 2D Echocardiogram has been performed.  Scott Holden 05/29/2015, 9:49 AM

## 2015-05-29 NOTE — Progress Notes (Signed)
eLink Physician-Brief Progress Note Patient Name: Scott Holden DOB: [birth year] MRN: [id number]   Date of Service  05/29/2015  HPI/Events of Note  Patient is in refractory cardiogenic shock +/- septic shock (ruptured appendix). Currently being supported by maximal dose Norepinephrine, Phenylephrine and Dopamine IV infusions. He is also on IABP with 1:1 counter pulsion. SBP = 60's. He is failing maximal medical support. Also called by radiology that he has a widened mediastinum on CXR. However, he is too sick to survive a trip to get a chest CT scan. His last pH = 7.11. His clinical picture does not appear to be survivable.   eICU Interventions  Will order: 1. Vasopressin IV infusion. 2. NaHCO3 100 meq IV now.  3. NaHCO3 IV infusion at 100 meq/hour. 4. ABG at 3 AM.   Will ask physician covering floor to readdress code status with the family.      Intervention Category Major Interventions: Hypotension - evaluation and management;Shock - evaluation and management  Sommer,Steven Dennard Nip 05/29/2015, 2:48 AM

## 2015-05-29 NOTE — Progress Notes (Signed)
Vtach without pulse 0026. Shock x 1 with immediate return of pulses.

## 2015-05-29 NOTE — Progress Notes (Signed)
Patient shocked x1 for VT, dr. Donnamae Jude at the bedside. Pulse returned.

## 2015-05-29 NOTE — Progress Notes (Signed)
ANTIBIOTIC CONSULT NOTE - INITIAL  Pharmacy Consult for Vancocin and Merrem Indication: sepsis and intra-abdominal infection  No Known Allergies  Patient Measurements: Height: 5\' 10"  (177.8 cm) Weight: 263 lb (119.296 kg) IBW/kg (Calculated) : 73  Vital Signs: Temp: 100.7 F (38.2 C) (12/02 2345) Temp Source: Oral (12/02 2345) BP: 110/84 mmHg (12/02 2353) Pulse Rate: 100 (12/02 2353)  Labs:  Recent Labs  06/06/15 0445 2015/06/06 1852 05/29/15 0050  WBC 19.5* 17.1*  --   HGB 14.9 13.4  --   PLT 239 209  --   CREATININE 1.04 1.51* 1.49*   Estimated Creatinine Clearance: 69.1 mL/min (by C-G formula based on Cr of 1.49).   Microbiology: Recent Results (from the past 720 hour(s))  MRSA PCR Screening     Status: Abnormal   Collection Time: June 06, 2015 11:00 PM  Result Value Ref Range Status   MRSA by PCR POSITIVE (A) NEGATIVE Final    Comment:        The GeneXpert MRSA Assay (FDA approved for NASAL specimens only), is one component of a comprehensive MRSA colonization surveillance program. It is not intended to diagnose MRSA infection nor to guide or monitor treatment for MRSA infections. RESULT CALLED TO, READ BACK BY AND VERIFIED WITH: TOMILSON @0159  05/29/15 MKELLY     Medical History: Past Medical History  Diagnosis Date  . Kidney calculi   . Hypertension   . History of TIAs   . Arthritis   . Back pain     trouble with lumbar 2, 3, and 4 - degenerative disease per pt  . Unspecified transient cerebral ischemia   . HA (headache)   . Syncope and collapse   . BPH (benign prostatic hyperplasia)   . GERD (gastroesophageal reflux disease)   . Diabetes mellitus without complication (HCC)   . Seizure (HCC)   . BPH (benign prostatic hyperplasia)   . Sarcoidosis of lung (HCC)   . Tobacco abuse     Medications:  Prescriptions prior to admission  Medication Sig Dispense Refill Last Dose  . albuterol (PROVENTIL HFA;VENTOLIN HFA) 108 (90 BASE) MCG/ACT inhaler  Inhale 1-2 puffs into the lungs every 6 (six) hours as needed for wheezing. 1 Inhaler 0 Past Month at Unknown time  . diltiazem (CARDIZEM CD) 180 MG 24 hr capsule Take 1 capsule (180 mg total) by mouth every morning. 30 capsule 0 Past Week at Unknown time  . dipyridamole-aspirin (AGGRENOX) 200-25 MG per 12 hr capsule Take 1 capsule by mouth 2 (two) times daily. 60 capsule 6 Past Week at Unknown time  . HYDROcodone-acetaminophen (NORCO/VICODIN) 5-325 MG per tablet Take 1 tablet by mouth every 6 (six) hours as needed. (Patient taking differently: Take 1 tablet by mouth every 6 (six) hours as needed for moderate pain. ) 30 tablet 0 05/27/2015 at Unknown time  . ibuprofen (ADVIL,MOTRIN) 200 MG tablet Take 600 mg by mouth every 6 (six) hours as needed for moderate pain.   05/27/2015 at Unknown time  . ipratropium-albuterol (DUONEB) 0.5-2.5 (3) MG/3ML SOLN Take 3 mLs by nebulization every 4 (four) hours as needed (For shortness of breath). 360 mL 3 Past Month at Unknown time  . lamoTRIgine (LAMICTAL) 25 MG tablet TAKE 3 TABLETS BY MOUTH TWICE A DAY FOR 1 WEEK THEN TAKE 4 TABLETS BY MOUTH TWICE A DAY (Patient taking differently: take 4 tablets by mouth twice a day.) 240 tablet 3 Past Week at Unknown time  . loratadine (CLARITIN) 10 MG tablet Take 10 mg by mouth every  morning.   Past Week at Unknown time  . metFORMIN (GLUCOPHAGE) 500 MG tablet Take 1 tablet by mouth 2 (two) times daily.  4 Past Week at Unknown time  . omeprazole (PRILOSEC) 40 MG capsule Take 40 mg by mouth every morning.    Past Week at Unknown time  . traMADol (ULTRAM) 50 MG tablet Take 50 mg by mouth 3 (three) times daily as needed for moderate pain.   05/27/2015 at Unknown time   Scheduled:  . atorvastatin  80 mg Per NG tube q1800  . clopidogrel  75 mg Per NG tube Daily  . fentaNYL      . fentaNYL (SUBLIMAZE) injection  50 mcg Intravenous Once  . hydrocortisone sod succinate (SOLU-CORTEF) inj  100 mg Intravenous Q8H  . Influenza vac split  quadrivalent PF  0.5 mL Intramuscular Tomorrow-1000  . insulin aspart  0-9 Units Subcutaneous TID WC  . lactated ringers  1,000 mL Intravenous Once  . midazolam      . potassium chloride  10 mEq Intravenous Q1 Hr x 2  . potassium chloride  10 mEq Intravenous Q1 Hr x 3  . potassium chloride      . sodium chloride  3 mL Intravenous Q12H   Infusions:  . 0.9 % NaCl with KCl 20 mEq / L 100 mL/hr at 2015/06/07 1043  . amiodarone    . DOPamine    . fentaNYL infusion INTRAVENOUS 100 mcg/hr (06/07/15 2145)  . heparin    . lactated ringers    . lidocaine 3 mg/min (June 07, 2015 2338)  . midazolam (VERSED) infusion 2 mg/hr (June 07, 2015 2314)  . norepinephrine (LEVOPHED) Adult infusion 35 mcg/min (05/29/15 0225)  . phenylephrine (NEO-SYNEPHRINE) Adult infusion    . tirofiban 0.15 mcg/kg/min (2015/06/07 2313)    Assessment: 58yo critically ill male presented to [facility] w/ acute appendicitis and during surgery pt went into VT followed by VF requiring CPR, surgery stopped and pt stabilized, tx'd to [facility] for emergent cath for anterior STEMI, IABP placed in cath lab, in ICU w/ pressors at high rates, now to begin IV ABX for suspected sepsis and intra-abdominal infection w/ gangrenous appendix found w/ procedure; of note pt is unstable w/ SCr worsening.  Goal of Therapy:  Vancomycin trough level 15-20 mcg/ml  Plan:  Will give vancomycin  IV x1 followed by  IV Q24H (may change w/ renal function) as well as Merrem 1g IV Q8H and monitor CBC, Cx, SCr, levels prn.  Vernard Gambles, PharmD, BCPS  05/29/2015,2:25 AM

## 2015-05-29 NOTE — Progress Notes (Addendum)
Noted pt to be in bigeminy.  Then pacer spikes began to appear on ECG but in inappropriate places, failure to sense.  Then pt went into Vfib/ torsades three times.  Each time was shocked and returned to normal sinus rhythm.  Temporary pacer turned off.  HR 80s, BP 90s/70s.  Elink MD notified and also told of pt's decreased urine output < 30cc/hr for 2 hours.  Dr. Katrinka Blazing also notified and orders received to draw stat magnesium, and bmet and leave temporary pacer off.  Dr. Katrinka Blazing notified pt CVP still 15 on 500cc ns/hr.  Will continue to monitor.

## 2015-05-29 NOTE — Progress Notes (Signed)
CRITICAL VALUE ALERT  Critical value received:  Lactic Acid 6.95  Date of notification:  05/29/15  Time of notification:  0326  Critical value read back:Yes.    Nurse who received alert:  Rocco Pauls RN  MD notified   Dr Donnamae Jude at bedside

## 2015-05-29 NOTE — Progress Notes (Signed)
CRITICAL VALUE ALERT  Critical value received:  Troponin >65  Date of notification:  06/11/2015  Time of notification:  2310  Critical value read back:Yes.    Nurse who received alert:  Madilyn Fireman, RN  MD notified (1st page):  Dr. Benay Pillow  Time of first page:  2315  Responding MD:  Dr. Benay Pillow  Time MD responded:  2320

## 2015-05-29 NOTE — Consult Note (Signed)
ANTICOAGULATION CONSULT NOTE - Initial Consult  Pharmacy Consult for Heparin Indication: IABP  No Known Allergies  Patient Measurements: Height: 5\' 10"  (177.8 cm) Weight: 262 lb 14.4 oz (119.25 kg) IBW/kg (Calculated) : 73 Heparin Dosing Weight: ~99.5kg  Vital Signs: Temp: 98.7 F (37.1 C) (12/03 0715) Temp Source: Oral (12/03 0715) BP: 102/70 mmHg (12/03 1000) Pulse Rate: 90 (12/03 0752)  Labs:  Recent Labs  06/19/2015 0445  06/19/2015 1852 06/21/2015 1853 06/15/2015 2309 05/29/15 0050 05/29/15 0445 05/29/15 0502 05/29/15 1000  HGB 14.9  --  13.4  --   --   --  13.5 15.0  --   HCT 44.2  --  40.8  --   --   --  42.2 44.0  --   PLT 239  --  209  --   --   --  189  --   --   HEPARINUNFRC  --   --   --   --   --   --   --   --  0.11*  CREATININE 1.04  --  1.51*  --   --  1.49* 2.24* 1.90*  --   CKTOTAL  --   --   --  49  --   --   --   --   --   CKMB  --   --   --  8.9*  --   --   --   --   --   TROPONINI  --   < > 0.65*  --  >65.00*  --  >65.00*  --  >65.00*  < > = values in this interval not displayed.  Estimated Creatinine Clearance: 54.2 mL/min (by C-G formula based on Cr of 1.9).   Medical History: Past Medical History  Diagnosis Date  . Kidney calculi   . Hypertension   . History of TIAs   . Arthritis   . Back pain     trouble with lumbar 2, 3, and 4 - degenerative disease per pt  . Unspecified transient cerebral ischemia   . HA (headache)   . Syncope and collapse   . BPH (benign prostatic hyperplasia)   . GERD (gastroesophageal reflux disease)   . Diabetes mellitus without complication (HCC)   . Seizure (HCC)   . BPH (benign prostatic hyperplasia)   . Sarcoidosis of lung (HCC)   . Tobacco abuse    Assessment: 58yom presented on 06/20/2015 to [facility] with acute appendicitis and was taken to the OR for a laparoscopic appendectomy. In the OR he went into cardiac arrest. EKG showed CHB and ST elevations so Code STEMI was called. He was transferred to [facility] and taken emergently to the cath lab where he received a BMS to his RCA, temporary pacemaker and IABP were placed. He was started on heparin. He has also been continued on aggrastat x 18 hours.  Initial HL is below goal at 0.11, no interruptions in gtt per nurse. CBC remains wnl and stable. Per nurse, did note significant bleeding with central line placement and with finger sticks. Will continue to monitor closely.    Goal of Therapy:  Heparin level 0.2-0.5 units/ml Monitor platelets by anticoagulation protocol: Yes   Plan:  1) Increase heparin to 1450 units/hr with no bolus 2) Check 6 hour heparin level 3) Daily heparin level and CBC  Jeryn Bertoni K. Bonnye Fava, PharmD, BCPS, CPP Clinical Pharmacist Pager: [phone] Phone: [phone] 05/29/2015 10:53 AM

## 2015-05-29 NOTE — Progress Notes (Signed)
Initial Nutrition Assessment  DOCUMENTATION CODES:   Obesity unspecified  INTERVENTION:   If pt unable to extubate within 48 hours of intubation, recommend:  Initiate Vital High Protein @ 20 ml/hr via OGT and increase by 10 ml every 4 hours to goal rate of 75 ml/hr.   Tube feeding regimen provides 1800 kcal (100% of needs), 158 grams of protein, and 1505 ml of H2O.   NUTRITION DIAGNOSIS:   Inadequate oral intake related to inability to eat as evidenced by NPO status.  GOAL:   Provide needs based on ASPEN/SCCM guidelines  MONITOR:   Vent status, Labs, Weight trends, Skin, I & O's  REASON FOR ASSESSMENT:   Ventilator    ASSESSMENT:   69M presented to [facility] with several days of progressively worsening RLQ pain found to have an acute appendicitis. He went to the OR and suffered a VT arrest on the table after the insertion of the trochar. He was transferred to [facility] after a code STEMI was called and the surgical intervention was aborted. In the cath lab an RCA DES was placed and an IABP inserted for MI and cardiogenic shock. While in cath lab, he required shock x6 for VF arrest and post return to ICU, shock x5.   Pt transferred from [facility] after experiencing cardiac arrest while undergoing lap appendectomy. Pt currently with cardiogenic and septic shock, on multiple pressors.   Patient is currently intubated on ventilator support. OGT in place. MV: 18.3 L/min Temp (24hrs), Avg:99 F (37.2 C), Min:98 F (36.7 C), Max:100.7 F (38.2 C)  Propofol: n/a  Per MD notes, prognosis is poor.  Unable to complete Nutrition-Focused physical exam at this time, due to medical instability.   Labs reviewed: CBGS: 222.   Diet Order:  Diet NPO time specified  Skin:  Reviewed, no issues  Last BM:  05/27/15  Height:   Ht Readings from Last 1 Encounters:  06/21/2015 5\' 10"  (1.778 m)    Weight:   Wt Readings from Last 1 Encounters:  05/29/15 262 lb 14.4 oz (119.25 kg)    Ideal  Body Weight:  75.5 kg  BMI:  Body mass index is 37.72 kg/(m^2).  Estimated Nutritional Needs:   Kcal:  0093-8182  Protein:  >151 grams  Fluid:  1.6-1.8 L  EDUCATION NEEDS:   Education needs no appropriate at this time  Othella Slappey A. Mayford Knife, RD, LDN, CDE Pager: [phone] After hours Pager: [phone]

## 2015-05-29 NOTE — Progress Notes (Signed)
Patient Name: Scott Holden Date of Encounter: 05/29/2015    SUBJECTIVE: 59 year old smoker, cardiogenic shock secondary to acute right ventricular and inferior infarct occurring intraoperatively, acute appendicitis unable to be treated surgically due to cardiac arrest, obesity, morbid obesity, transient AV block, malignant ventricular arrhythmias (related to acute ischemia reperfusion injury), and history of diabetes mellitus.  The patient is awake. He was able to nod appropriately to questioning. He denies dyspnea.  Appendectomy was aborted prior to any intestinal incision.   TELEMETRY:  Sinus rhythm with PVCs. Filed Vitals:   05/29/15 0700 05/29/15 0715 05/29/15 0752 05/29/15 0800  BP:  143/79 143/79   Pulse:  90 90   Temp:  98.7 F (37.1 C)    TempSrc:  Oral    Resp: Height:      Weight:      SpO2: 95% 95% 95% 94%    Intake/Output Summary (Last 24 hours) at 05/29/15 0856 Last data filed at 05/29/15 0805  Gross per 24 hour  Intake 15665.4 ml  Output   1503 ml  Net 14162.4 ml   LABS: Basic Metabolic Panel:  Recent Labs  16/10/96 2307 05/29/15 0050 05/29/15 0445 05/29/15 0502  NA  --  138 132* 133*  K  --  3.3* 3.3* 3.3*  CL  --  106 98* 94*  CO2  --  22 19*  --   GLUCOSE  --  128* 311* 312*  BUN  --  CREATININE  --  1.49* 2.24* 1.90*  CALCIUM  --  11.4* 7.8*  --   MG 1.4*  --  1.7  --    CBC:  Recent Labs  06-18-15 1852 05/29/15 0445 05/29/15 0502  WBC 17.1* 21.4*  --   NEUTROABS 15.4*  --   --   HGB 13.4 13.5 15.0  HCT 40.8 42.2 44.0  MCV 91.7 91.5  --   PLT 209 189  --    Cardiac Enzymes:  Recent Labs  06-18-2015 1852 18-Jun-2015 1853 2015/06/18 2309 05/29/15 0445  CKTOTAL  --  49  --   --   CKMB  --  8.9*  --   --   TROPONINI 0.65*  --  >65.00* >65.00*     Radiology/Studies:  Chest x-ray 05/29/15 IMPRESSION: Left IJ central line with tip over central SVC. No pneumothorax.  Interval widening of the  mediastinum concerning for traumatic vascular injury. CT is recommended for further evaluation.  Critical Value/emergent results were called by telephone at the time of interpretation on 05/29/2015 at 2:22 am to nurse [facility], who verbally acknowledged these results.  Physical Exam: Blood pressure 143/79, pulse 90, temperature 98.7 F (37.1 C), temperature source Oral, resp. rate 30, height  (1.778 m), weight 262 lb 14.4 oz (119.25 kg), SpO2 94 %. Weight change: -1.6 oz (-0.046 kg)  Wt Readings from Last 3 Encounters:  05/29/15 262 lb 14.4 oz (119.25 kg)  12/03/14 293 lb 12.8 oz (133.267 kg)  11/11/14 295 lb (133.811 kg)   Obese  Multiple support apparatus including balloon pump and endotracheal tube Chest is clear anteriorly Cardiac exam reveals no obvious murmur or rub but difficult exam due to support apparatus Morbid obesity, nontender abdomen. Decreased bowel sounds. No peripheral edema. Left femoral access site with intra-aortic balloon pump and no evidence of hematoma.   neurologically intact and able to follow commands.   ASSESSMENT   1. Cardiogenic shock secondary to  acute inferior and RV infarction 2. Malignant ventricular arrhythmias, acute ischemic in nature, and improving on IV amiodarone and with resolution of acute ischemia 3. Acute kidney injury related to shock 4. Acute appendicitis and possible sepsis 5. Respiratory failure with ventilator dependence   Plan:  1. Aggressive fluid resuscitation with at least 200-250 cc of IV saline per hour 2. Aggressively wean and DC pressors 3. dual antiplatelet therapy  4. Continue intra-aortic balloon pump at 1:1 5. Continue IV amiodarone 6. Coordinate care with critical care service 7. 2-D Doppler echocardiogram to assess LV function  Signed, Lesleigh Noe 05/29/2015, 8:56 AM

## 2015-05-29 NOTE — Progress Notes (Signed)
   05/29/15 0000  Clinical Encounter Type  Visited With Family;Patient not available  Visit Type Code  Referral From Nurse;Chaplain  Consult/Referral To Chaplain  Spiritual Encounters  Spiritual Needs Emotional  Stress Factors  Family Stress Factors Major life changes;Health changes  CH responded to Code STEMI; escorted family to waiting area and then to [facility] waiting area offering spiritual and emotional support.  CH available as needed. Erline Levine

## 2015-05-29 NOTE — Progress Notes (Signed)
1 Day Post-Op  Subjective: Patient is critically ill on almost maximal pressor support and ventilator support.  Balloon pump.  Emergent cardiac cath - bare metal stent to RCA.  Patient is on heparin gtt.  Cardiogenic shock, possibly with some septic component.  Asked by Cardiology and Critical Care Medicine to reevaluate the patient.    I reviewed the operative note from Dr. Johna Sheriff.  The appendix was obviously inflamed and necrotic, but there was no obvious sign of perforation or abscess.  The procedure was aborted because of cardiac arrest on the OR table.    Objective: Vital signs in last 24 hours: Temp:  [97.4 F (36.3 C)-102.6 F (39.2 C)] 100.7 F (38.2 C) (12/02 2345) Pulse Rate:  [0-281] 111 (12/03 0300) Resp:  [0-35] 30 (12/03 0330) BP: (66-221)/(40-186) 85/69 mmHg (12/03 0330) SpO2:  [0 %-100 %] 93 % (12/03 0300) Arterial Line BP: (66-108)/(42-77) 85/69 mmHg (12/03 0330) FiO2 (%):  [60 %-100 %] 80 % (12/03 0301) Weight:  [119.296 kg (263 lb)] 119.296 kg (263 lb) (12/02 0435) Last BM Date: 05/27/15  Intake/Output from previous day: 12/02 0701 - 12/03 0700 In: 5541.1 [I.V.:5041.1; IV Piggyback:500] Out: 520 [Urine:520] Intake/Output this shift: Total I/O In: 4141.1 [I.V.:3641.1; IV Piggyback:500] Out: 95 [Urine:95]  Intubated, sedated Abd - obese, soft; no obvious peritonitis (although sedated) No palpable masses  Lab Results:   Recent Labs  06/24/2015 0445 06/15/2015 1852  WBC 19.5* 17.1*  HGB 14.9 13.4  HCT 44.2 40.8  PLT 239 209   BMET  Recent Labs  05/29/2015 1852 05/29/15 0050  NA 137 138  K 3.1* 3.3*  CL 107 106  CO2 24 22  GLUCOSE 132* 128*  BUN 19 16  CREATININE 1.51* 1.49*  CALCIUM 11.1* 11.4*   PT/INR No results for input(s): LABPROT, INR in the last 72 hours. ABG  Recent Labs  05/29/15 0220 05/29/15 0331  PHART 7.118* 7.214*  HCO3 16.5* 17.5*    Studies/Results: Ct Abdomen Pelvis W Contrast  05/27/2015  CLINICAL DATA:  Right  lower quadrant pain.  Leukocytosis. EXAM: CT ABDOMEN AND PELVIS WITH CONTRAST TECHNIQUE: Multidetector CT imaging of the abdomen and pelvis was performed using the standard protocol following bolus administration of intravenous contrast. CONTRAST:  OMNIPAQUE IOHEXOL 300 MG/ML  SOLN COMPARISON:  12/03/2014 FINDINGS: There is enlargement and inflammation of the appendix with features typical of acute appendicitis. There is periappendiceal fluid, concerning for rupture but no frank extraluminal air. There is a 6 x 9 mm right ureteral calculus at the L5 level with marked hydronephrosis. There is not a significant degree of periureteral and perinephric stranding, implying that this is not an acute ureteral obstruction. However, the ureteral calculus is clearly new compared to prior CT of 12/03/2014. There are multiple collecting system calculi in the left kidney measuring up to 3 mm. There are unremarkable appearances of the liver, gallbladder, bile ducts, pancreas, spleen and adrenals. Stomach and small bowel are unremarkable. The abdominal aorta is normal in caliber. There is mild atherosclerotic calcification. There is no adenopathy in the abdomen or pelvis. There is no significant abnormality in the lower chest. No significant musculoskeletal lesion. Lower lumbar facet arthropathy is present from L4 through S1. IMPRESSION: 1. Acute appendicitis. 2. 6 x 9 mm right ureteral calculus at the L5 level with marked hydronephrosis. This is recent but probably not acute. 3. Bilateral nephrolithiasis These results were called by telephone at the time of interpretation on 06/02/2015 at 6:47 am to Dr. Ross Marcus ,  who verbally acknowledged these results. Electronically Signed   By: Ellery Plunk M.D.   On: 06/17/2015 06:48   Dg Chest Portable 1 View  05/29/2015  CLINICAL DATA:  59 year old male with central line placement EXAM: PORTABLE CHEST 1 VIEW COMPARISON:  Chest radiograph dated 12/02/2014 FINDINGS: An  endotracheal tube is noted with tip approximately 7 cm above the carina. An enteric tube is seen coursing over the mediastinum which she beyond the image cut off. A left IJ central line noted with tip over central SVC. Single-view of the chest demonstrate clear lungs. There is no pneumothorax. There has been interval widening of the mediastinal silhouette compared to the prior study. A traumatic vascular injury with hemorrhage into the mediastinum is not excluded. CT is recommended for further evaluation. The osseous structures are grossly unremarkable. IMPRESSION: Left IJ central line with tip over central SVC.  No pneumothorax. Interval widening of the mediastinum concerning for traumatic vascular injury. CT is recommended for further evaluation. Critical Value/emergent results were called by telephone at the time of interpretation on 05/29/2015 at 2:22 am to nurse [facility], who verbally acknowledged these results. Electronically Signed   By: Elgie Collard M.D.   On: 05/29/2015 02:23    Anti-infectives: Anti-infectives    Start     Dose/Rate Route Frequency Ordered Stop   05/30/15 0600  vancomycin (VANCOCIN) 1,250 mg in sodium chloride 0.9 % 250 mL IVPB     1,250 mg 166.7 mL/hr over 90 Minutes Intravenous Every 24 hours 05/29/15 0237     05/29/15 1200  meropenem (MERREM) 1 g in sodium chloride 0.9 % 100 mL IVPB     1 g 200 mL/hr over 30 Minutes Intravenous Every 8 hours 05/29/15 0237     05/29/15 0600  piperacillin-tazobactam (ZOSYN) IVPB 3.375 g  Status:  Discontinued     3.375 g 12.5 mL/hr over 240 Minutes Intravenous 3 times per day 06/20/2015 2256 05/29/15 0216   05/29/15 0245  vancomycin (VANCOCIN) 2,500 mg in sodium chloride 0.9 % 500 mL IVPB     2,500 mg 250 mL/hr over 120 Minutes Intravenous  Once 05/29/15 0237     05/29/15 0245  meropenem (MERREM) 1 g in sodium chloride 0.9 % 100 mL IVPB     1 g 200 mL/hr over 30 Minutes Intravenous  Once 05/29/15 0237 05/29/15 0339   06/22/2015 2315   piperacillin-tazobactam (ZOSYN) IVPB 3.375 g     3.375 g 100 mL/hr over 30 Minutes Intravenous  Once 05/27/2015 2300 05/29/15 0124   06/03/2015 0830  cefTRIAXone (ROCEPHIN) 2 g in dextrose 5 % 50 mL IVPB  Status:  Discontinued     2 g 100 mL/hr over 30 Minutes Intravenous Every 24 hours 06/16/2015 0815 06/23/2015 2256   06/14/2015 0830  metroNIDAZOLE (FLAGYL) IVPB 500 mg  Status:  Discontinued     500 mg 100 mL/hr over 60 Minutes Intravenous Every 8 hours 06/06/2015 0815 05/30/2015 2256   06/01/2015 0715  piperacillin-tazobactam (ZOSYN) IVPB 3.375 g     3.375 g 12.5 mL/hr over 240 Minutes Intravenous  Once 06/26/2015 0700 06/05/2015 0907      Assessment/Plan: s/p Procedure(s): Left Heart Cath (N/A) Acute appendicitis - necrotic but not perforated at the time of surgery.  It does not appear that there is an abscess to drain percutaneously.  Therefore, there would be no benefit to asking interventional radiology to place an ultrasound guided percutaneous drain into the right lower quadrant.  If his clinical status somehow improves, then we  can consider surgery.  The heparin gtt would need to be held for surgery.  Would continue aggressive antibiotic coverage.  We will continue to follow.  LOS: 1 day    Tramon Crescenzo K. 05/29/2015

## 2015-05-29 NOTE — Consult Note (Signed)
PULMONARY / CRITICAL CARE MEDICINE   Name: Scott Holden MRN: [id number] DOB: 04/13/1966    ADMISSION DATE:  06/21/2015 CONSULTATION DATE:  05/27/2015   REFERRING MD :  Dr. Clifton James  CHIEF COMPLAINT:  Cardiogenic and septic shock  INITIAL PRESENTATION:  59M presented to [facility] with several days of progressively worsening RLQ pain found to have an acute appendicitis.  He went to the OR and suffered a VT arrest on the table after the insertion of the trochar.  He was transferred to [facility] after a code STEMI was called and the surgical intervention was aborted.  In the cath lab an RCA DES was placed and an IABP inserted for MI and cardiogenic shock.    PCCM is consulted for assistance in mangement of ventilator and shock    PAST MEDICAL HISTORY :   has a past medical history of Kidney calculi; Hypertension; History of TIAs; Arthritis; Back pain; Unspecified transient cerebral ischemia; HA (headache); Syncope and collapse; BPH (benign prostatic hyperplasia); GERD (gastroesophageal reflux disease); Diabetes mellitus without complication (HCC); Seizure [facility]); BPH (benign prostatic hyperplasia); Sarcoidosis of lung (HCC); and Tobacco abuse.  has past surgical history that includes Total hip arthroplasty; Joint replacement; Knee surgery; and Hernia repair (2003). Prior to Admission medications   Medication Sig Start Date End Date Taking? Authorizing Provider  albuterol (PROVENTIL HFA;VENTOLIN HFA) 108 (90 BASE) MCG/ACT inhaler Inhale 1-2 puffs into the lungs every 6 (six) hours as needed for wheezing. 11/27/12  Yes Derwood Kaplan, MD  diltiazem (CARDIZEM CD) 180 MG 24 hr capsule Take 1 capsule (180 mg total) by mouth every morning. 12/05/14  Yes Penny Pia, MD  dipyridamole-aspirin (AGGRENOX) 200-25 MG per 12 hr capsule Take 1 capsule by mouth 2 (two) times daily. 01/15/14  Yes Nilda Riggs, NP  HYDROcodone-acetaminophen (NORCO/VICODIN) 5-325 MG per tablet Take 1 tablet by mouth every 6 (six) hours  as needed. Patient taking differently: Take 1 tablet by mouth every 6 (six) hours as needed for moderate pain.  12/05/14  Yes Penny Pia, MD  ibuprofen (ADVIL,MOTRIN) 200 MG tablet Take 600 mg by mouth every 6 (six) hours as needed for moderate pain.   Yes Historical Provider, MD  ipratropium-albuterol (DUONEB) 0.5-2.5 (3) MG/3ML SOLN Take 3 mLs by nebulization every 4 (four) hours as needed (For shortness of breath). 01/15/13  Yes Laveda Norman, MD  lamoTRIgine (LAMICTAL) 25 MG tablet TAKE 3 TABLETS BY MOUTH TWICE A DAY FOR 1 WEEK THEN TAKE 4 TABLETS BY MOUTH TWICE A DAY Patient taking differently: take 4 tablets by mouth twice a day. 03/17/14  Yes Nilda Riggs, NP  loratadine (CLARITIN) 10 MG tablet Take 10 mg by mouth every morning.   Yes Historical Provider, MD  metFORMIN (GLUCOPHAGE) 500 MG tablet Take 1 tablet by mouth 2 (two) times daily. 10/22/14  Yes Historical Provider, MD  omeprazole (PRILOSEC) 40 MG capsule Take 40 mg by mouth every morning.    Yes Historical Provider, MD  traMADol (ULTRAM) 50 MG tablet Take 50 mg by mouth 3 (three) times daily as needed for moderate pain.   Yes Historical Provider, MD  tamsulosin (FLOMAX) 0.4 MG CAPS capsule Take 1 capsule (0.4 mg total) by mouth daily. 06/02/2015   Barnetta Chapel, PA-C   No Known Allergies  FAMILY HISTORY:  indicated that his mother is alive. He indicated that his father is alive.  SOCIAL HISTORY:  reports that he has been smoking Cigarettes.  He has a 10 pack-year smoking history. He has  never used smokeless tobacco. He reports that he does not drink alcohol or use illicit drugs.  REVIEW OF SYSTEMS:  Unable to obtain  SUBJECTIVE:   VITAL SIGNS: Temp:  [97.4 F (36.3 C)-102.6 F (39.2 C)] 100.7 F (38.2 C) (12/02 2345) Pulse Rate:  [0-281] 111 (12/03 0300) Resp:  [0-35] 30 (12/03 0315) BP: (71-221)/(40-186) 71/60 mmHg (12/03 0300) SpO2:  [0 %-100 %] 93 % (12/03 0300) Arterial Line BP: (66-108)/(42-77) 83/71 mmHg (12/03  0315) FiO2 (%):  [60 %-100 %] 80 % (12/03 0301) Weight:  [119.296 kg (263 lb)] 119.296 kg (263 lb) (12/02 0435) HEMODYNAMICS: CVP:  [18 mmHg] 18 mmHg VENTILATOR SETTINGS: Vent Mode:  [-] PRVC FiO2 (%):  [60 %-100 %] 80 % Set Rate:  [14 bmp-35 bmp] 35 bmp Vt Set:  [580 mL-680 mL] 680 mL PEEP:  [5 cmH20-8 cmH20] 8 cmH20 Plateau Pressure:  [15 cmH20-31 cmH20] 31 cmH20 INTAKE / OUTPUT:  Intake/Output Summary (Last 24 hours) at 05/29/15 0322 Last data filed at 05/29/15 0230  Gross per 24 hour  Intake 5441.08 ml  Output    470 ml  Net 4971.08 ml    PHYSICAL EXAMINATION: General:  Sedated + acute distress Neuro:  Sedated  HEENT:  ETT in place Cardiovascular:  Tachycardia.  Irregular and regular alternation. + mechanical sounds.  Distant heart sounds. Lungs:  CTA b/l Abdomen:  Soft, normal bowel sounds. No peritoneal signs Musculoskeletal:   Normal tone Skin:  Cool extremities.  LABS:  CBC  Recent Labs Lab 06/21/2015 0445 06/06/2015 1852  WBC 19.5* 17.1*  HGB 14.9 13.4  HCT 44.2 40.8  PLT 239 209   Coag's No results for input(s): APTT, INR in the last 168 hours. BMET  Recent Labs Lab 05/31/2015 0445 06/23/2015 1852 05/29/15 0050  NA 138 137 138  K 3.4* 3.1* 3.3*  CL 101 107 106  CO2 30 24 22   BUN 20 19 16   CREATININE 1.04 1.51* 1.49*  GLUCOSE 130* 132* 128*   Electrolytes  Recent Labs Lab 06/20/2015 0445 06/24/2015 1852 06/26/2015 2307 05/29/15 0050  CALCIUM 9.1 11.1*  --  11.4*  MG  --   --  1.4*  --    Sepsis Markers No results for input(s): LATICACIDVEN, PROCALCITON, O2SATVEN in the last 168 hours. ABG  Recent Labs Lab 06/04/2015 1830 06/26/2015 2206 05/29/15 0220  PHART 7.224* 7.275* 7.118*  PCO2ART 55.6* 51.3* 51.7*  PO2ART 164* 181.0* 73.0*   Liver Enzymes  Recent Labs Lab 06/02/2015 0445 06/21/2015 1852  AST 20 25  ALT 17 16*  ALKPHOS 86 60  BILITOT 0.7 1.3*  ALBUMIN 4.1 3.0*   Cardiac Enzymes  Recent Labs Lab 06/06/2015 1852 05/30/2015 2309   TROPONINI 0.65* >65.00*   Glucose  Recent Labs Lab 06/03/2015 1223 06/21/2015 2311  GLUCAP 98 172*    Imaging Ct Abdomen Pelvis W Contrast  06/08/2015  CLINICAL DATA:  Right lower quadrant pain.  Leukocytosis. EXAM: CT ABDOMEN AND PELVIS WITH CONTRAST TECHNIQUE: Multidetector CT imaging of the abdomen and pelvis was performed using the standard protocol following bolus administration of intravenous contrast. CONTRAST:  OMNIPAQUE IOHEXOL 300 MG/ML  SOLN COMPARISON:  12/03/2014 FINDINGS: There is enlargement and inflammation of the appendix with features typical of acute appendicitis. There is periappendiceal fluid, concerning for rupture but no frank extraluminal air. There is a 6 x 9 mm right ureteral calculus at the L5 level with marked hydronephrosis. There is not a significant degree of periureteral and perinephric stranding, implying that this  is not an acute ureteral obstruction. However, the ureteral calculus is clearly new compared to prior CT of 12/03/2014. There are multiple collecting system calculi in the left kidney measuring up to 3 mm. There are unremarkable appearances of the liver, gallbladder, bile ducts, pancreas, spleen and adrenals. Stomach and small bowel are unremarkable. The abdominal aorta is normal in caliber. There is mild atherosclerotic calcification. There is no adenopathy in the abdomen or pelvis. There is no significant abnormality in the lower chest. No significant musculoskeletal lesion. Lower lumbar facet arthropathy is present from L4 through S1. IMPRESSION: 1. Acute appendicitis. 2. 6 x 9 mm right ureteral calculus at the L5 level with marked hydronephrosis. This is recent but probably not acute. 3. Bilateral nephrolithiasis These results were called by telephone at the time of interpretation on 16-Jun-2015 at 6:47 am to Dr. Ross Marcus , who verbally acknowledged these results. Electronically Signed   By: Ellery Plunk M.D.   On: 06-16-15 06:48   Dg  Chest Portable 1 View  05/29/2015  CLINICAL DATA:  58 year old male with central line placement EXAM: PORTABLE CHEST 1 VIEW COMPARISON:  Chest radiograph dated 12/02/2014 FINDINGS: An endotracheal tube is noted with tip approximately 7 cm above the carina. An enteric tube is seen coursing over the mediastinum which she beyond the image cut off. A left IJ central line noted with tip over central SVC. Single-view of the chest demonstrate clear lungs. There is no pneumothorax. There has been interval widening of the mediastinal silhouette compared to the prior study. A traumatic vascular injury with hemorrhage into the mediastinum is not excluded. CT is recommended for further evaluation. The osseous structures are grossly unremarkable. IMPRESSION: Left IJ central line with tip over central SVC.  No pneumothorax. Interval widening of the mediastinum concerning for traumatic vascular injury. CT is recommended for further evaluation. Critical Value/emergent results were called by telephone at the time of interpretation on 05/29/2015 at 2:22 am to nurse [facility], who verbally acknowledged these results. Electronically Signed   By: Elgie Collard M.D.   On: 05/29/2015 02:23     ASSESSMENT / PLAN: 60 yo male with multiorgan dysfunction syndrome due to mixed cardiogenic shock from intra-operative MI complicated by septic shock from appendicitis without source control.  His prognosis is overall very poor as he is on supra-maximal doses of 4 vaso-pressor agents, IABP and maximum ventilator support for has combined metabolic and respiratory acidosis.  PULMONARY A: Hypoxemic and hypercapnic respiratory failure despite max vent support. P:    - maximal ventilator support  - 8cc/kg TV   - PRVC to maximize minute ventilation still not optimized from a compensitory standpoint  - goal pH >7.2 to allow pressors to function    CARDIOVASCULAR A: Mixed cardiogenic shock in the setting of inferior intra-operative MI  and septic shock.  He is now starting to make some urine with improved vasopressor function after pH correction, he has an IABP set @ 1:1.  He has had numerous runs of Torsade as well as monomorphic VT and frequent PVCs requiring chemical and electric cardioversion as well as overdrive pacing. P:   - s/p DES to RCA  - heparin gtt and dual anti-platelet therapy.  - IABP 1:1  - Overdrive chemical pacing with dopamine and levophed goal HR >90  - s/p Amiodarone load 150mg  IV x2  - on Amio gtt  - lidocaine gtt  - Current pressors   - phenylephrine, dopamine, levophed, vasopressin  - Hydrocortisone stress dose  -  s/p 4 g IV magnesium now on mag gtt  - iCal checks as he is on a bicarb gtt  - ScVO2 = 72  - aggressive volume resuscitation with goal CVP 20-25 for inferior MI  - aggressive sedation for monomorphic VT suppression  RENAL A:  AGMA/NAGMA: 2/2 oliguric renal failure and lactic acidemia. P:    - max ventilator support for respiratory compensation  - Bicarb gtt and pushes to maintain pH >7.2  - UOP picking up now  - Unlikely to tolerate CRRT  GASTROINTESTINAL A:  Appendicitis   P:    - unable to provide souce control at this time.  - no e/o peritonitis or rupture  - Surgery will take the patient to the OR if stablizes.  HEMATOLOGIC A:  Dual Antiplatelet therapy and heparin gtt. P:   - plavex load  - agrenox  - heparin gtt  INFECTIOUS A:  Septic shock: 2/2 appendicitis without source control. P:   BCx2 06/16/2015 Abx: Vanc/zosyn/flagyl, start date 06/17/2015.  ENDOCRINE A:  Relative adrenal insufficiency.   P:    - Hydrocortisone  IV Q8hours  NEUROLOGIC A:  Sedated on versed and fentanyl.   P:   RASS goal: 0  - likely unable to tolerate propofol from a hemodynamic standpoint.   FAMILY  - Updates:  Several discussions with his sister, sons and parents tonight including cardiology to review the patient's extremely poor prognosis.  At this time they would like  to continue all efforts with the exception of chest compressions.  - Changed code status: No CPR.  Shocks and drugs OK.    Total critical care time: 240 min  Critical care time was exclusive of separately billable procedures and treating other patients.  Critical care was necessary to treat or prevent imminent or life-threatening deterioration.  Critical care was time spent personally by me on the following activities: development of treatment plan with patient and/or surrogate as well as nursing, discussions with consultants, evaluation of patient's response to treatment, examination of patient, obtaining history from patient or surrogate, ordering and performing treatments and interventions, ordering and review of laboratory studies, ordering and review of radiographic studies, pulse oximetry and re-evaluation of patient's condition.   Galvin Proffer, DO., MS [facility]        Pulmonary and Critical Care Medicine [facility] Pager: [phone]  05/29/2015, 3:22 AM

## 2015-05-29 NOTE — Progress Notes (Signed)
eLink Physician-Brief Progress Note Patient Name: LOVIS CALISTO DOB: 10/21/1955 MRN: [id number]   Date of Service  05/29/2015  HPI/Events of Note  K+ = 3.3, Mg++ = 1.4 and Creatinine = 1.49.  eICU Interventions  Replete Mg+ and K+.     Intervention Category Intermediate Interventions: Electrolyte abnormality - evaluation and management  Jahvier Aldea Eugene 05/29/2015, 1:18 AM

## 2015-05-29 NOTE — Progress Notes (Signed)
1 Day Post-Op  Subjective: Pt on vent with balloon pump support and multiple drips Opens eye   Objective: Vital signs in last 24 hours: Temp:  [98.1 F (36.7 C)-102.6 F (39.2 C)] 98.7 F (37.1 C) (12/03 0715) Pulse Rate:  [0-281] 90 (12/03 0715) Resp:  [0-35] 30 (12/03 0715) BP: (66-221)/(40-186) 143/79 mmHg (12/03 0715) SpO2:  [0 %-100 %] 95 % (12/03 0715) Arterial Line BP: (66-111)/(42-91) 108/79 mmHg (12/03 0600) FiO2 (%):  [60 %-100 %] 80 % (12/03 0715) Weight:  [119.25 kg (262 lb 14.4 oz)] 119.25 kg (262 lb 14.4 oz) (12/03 0400) Last BM Date: 05/27/15  Intake/Output from previous day: 12/02 0701 - 12/03 0700 In: 14281.5 [I.V.:12131.5; IV Piggyback:2150] Out: 1275 [Urine:1275] Intake/Output this shift:    GI: tender RLQ  soft not rigid   Lab Results:   Recent Labs  06/20/2015 1852 05/29/15 0445 05/29/15 0502  WBC 17.1* 21.4*  --   HGB 13.4 13.5 15.0  HCT 40.8 42.2 44.0  PLT 209 189  --    BMET  Recent Labs  05/29/15 0050 05/29/15 0445 05/29/15 0502  NA 138 132* 133*  K 3.3* 3.3* 3.3*  CL 106 98* 94*  CO2 22 19*  --   GLUCOSE 128* 311* 312*  BUN CREATININE 1.49* 2.24* 1.90*  CALCIUM 11.4* 7.8*  --    PT/INR No results for input(s): LABPROT, INR in the last 72 hours. ABG  Recent Labs  05/29/15 0331 05/29/15 0700  PHART 7.214* 7.333*  HCO3 17.5* 19.4*    Studies/Results: Ct Abdomen Pelvis W Contrast  05/31/2015  CLINICAL DATA:  Right lower quadrant pain.  Leukocytosis. EXAM: CT ABDOMEN AND PELVIS WITH CONTRAST TECHNIQUE: Multidetector CT imaging of the abdomen and pelvis was performed using the standard protocol following bolus administration of intravenous contrast. CONTRAST:  OMNIPAQUE IOHEXOL 300 MG/ML  SOLN COMPARISON:  12/03/2014 FINDINGS: There is enlargement and inflammation of the appendix with features typical of acute appendicitis. There is periappendiceal fluid, concerning for rupture but no frank extraluminal air.  There is a 6 x 9 mm right ureteral calculus at the L5 level with marked hydronephrosis. There is not a significant degree of periureteral and perinephric stranding, implying that this is not an acute ureteral obstruction. However, the ureteral calculus is clearly new compared to prior CT of 12/03/2014. There are multiple collecting system calculi in the left kidney measuring up to 3 mm. There are unremarkable appearances of the liver, gallbladder, bile ducts, pancreas, spleen and adrenals. Stomach and small bowel are unremarkable. The abdominal aorta is normal in caliber. There is mild atherosclerotic calcification. There is no adenopathy in the abdomen or pelvis. There is no significant abnormality in the lower chest. No significant musculoskeletal lesion. Lower lumbar facet arthropathy is present from L4 through S1. IMPRESSION: 1. Acute appendicitis. 2. 6 x 9 mm right ureteral calculus at the L5 level with marked hydronephrosis. This is recent but probably not acute. 3. Bilateral nephrolithiasis These results were called by telephone at the time of interpretation on 06/03/2015 at 6:47 am to Dr. Ross Marcus , who verbally acknowledged these results. Electronically Signed   By: Ellery Plunk M.D.   On: 06/06/2015 06:48   Dg Chest Portable 1 View  05/29/2015  CLINICAL DATA:  59 year old male with central line placement EXAM: PORTABLE CHEST 1 VIEW COMPARISON:  Chest radiograph dated 12/02/2014 FINDINGS: An endotracheal tube is noted with tip approximately 7 cm above the carina. An enteric tube is  seen coursing over the mediastinum which she beyond the image cut off. A left IJ central line noted with tip over central SVC. Single-view of the chest demonstrate clear lungs. There is no pneumothorax. There has been interval widening of the mediastinal silhouette compared to the prior study. A traumatic vascular injury with hemorrhage into the mediastinum is not excluded. CT is recommended for further evaluation.  The osseous structures are grossly unremarkable. IMPRESSION: Left IJ central line with tip over central SVC.  No pneumothorax. Interval widening of the mediastinum concerning for traumatic vascular injury. CT is recommended for further evaluation. Critical Value/emergent results were called by telephone at the time of interpretation on 05/29/2015 at 2:22 am to nurse [facility], who verbally acknowledged these results. Electronically Signed   By: Elgie Collard M.D.   On: 05/29/2015 02:23   Dg Abd Portable 1v  05/29/2015  CLINICAL DATA:  Orogastric tube placement EXAM: PORTABLE ABDOMEN - 1 VIEW COMPARISON:  None. FINDINGS: The orogastric tube extends into the stomach with tip in the region of the mid gastric body. Right hydronephrosis is present, with retention of contrast in the dilated right renal collecting system and proximal ureter, presumably due to the proximal ureteral calculus. IMPRESSION: Orogastric tube extends into the stomach. Right hydronephrosis with delayed excretion of contrast. Electronically Signed   By: Ellery Plunk M.D.   On: 05/29/2015 05:47    Anti-infectives: Anti-infectives    Start     Dose/Rate Route Frequency Ordered Stop   05/30/15 0600  vancomycin (VANCOCIN) 1,250 mg in sodium chloride 0.9 % 250 mL IVPB     1,250 mg 166.7 mL/hr over 90 Minutes Intravenous Every 24 hours 05/29/15 0237     05/29/15 1200  meropenem (MERREM) 1 g in sodium chloride 0.9 % 100 mL IVPB     1 g 200 mL/hr over 30 Minutes Intravenous Every 8 hours 05/29/15 0237     05/29/15 0600  piperacillin-tazobactam (ZOSYN) IVPB 3.375 g  Status:  Discontinued     3.375 g 12.5 mL/hr over 240 Minutes Intravenous 3 times per day 06/17/2015 2256 05/29/15 0216   05/29/15 0245  vancomycin (VANCOCIN) 2,500 mg in sodium chloride 0.9 % 500 mL IVPB     2,500 mg 250 mL/hr over 120 Minutes Intravenous  Once 05/29/15 0237 05/29/15 0612   05/29/15 0245  meropenem (MERREM) 1 g in sodium chloride 0.9 % 100 mL IVPB      1 g 200 mL/hr over 30 Minutes Intravenous  Once 05/29/15 0237 05/29/15 0339   06/19/2015 2315  piperacillin-tazobactam (ZOSYN) IVPB 3.375 g     3.375 g 100 mL/hr over 30 Minutes Intravenous  Once Jun 09, 2015 2300 05/29/15 0124   June 09, 2015 0830  cefTRIAXone (ROCEPHIN) 2 g in dextrose 5 % 50 mL IVPB  Status:  Discontinued     2 g 100 mL/hr over 30 Minutes Intravenous Every 24 hours Jun 09, 2015 0815 Jun 09, 2015 2256   05/30/2015 0830  metroNIDAZOLE (FLAGYL) IVPB 500 mg  Status:  Discontinued     500 mg 100 mL/hr over 60 Minutes Intravenous Every 8 hours 09-Jun-2015 0815 06/11/2015 2256   09-Jun-2015 0715  piperacillin-tazobactam (ZOSYN) IVPB 3.375 g     3.375 g 12.5 mL/hr over 240 Minutes Intravenous  Once Jun 09, 2015 0700 06/21/2015 1610      Assessment/Plan: Acute appendicitis Aborted laparoscopic appendectomy MI  Pt in critical condition and requiring significant CV support Recommend continued medical management of appendicitis at this point since he would unlikely tolerated any anesthesia Discussed with family  LOS: 1 day    Gwendloyn Forsee A. 05/29/2015

## 2015-05-29 NOTE — Consult Note (Signed)
PULMONARY / CRITICAL CARE MEDICINE   Name: Scott Holden MRN: [id number] DOB: 29-Jul-1955    ADMISSION DATE:  06/02/2015 CONSULTATION DATE:  05/27/2015   REFERRING MD :  Dr. Clifton James  CHIEF COMPLAINT:  Cardiogenic and septic shock  INITIAL PRESENTATION:  51M presented to [facility] with several days of progressively worsening RLQ pain found to have an acute appendicitis.  He went to the OR and suffered a VT arrest on the table after the insertion of the trochar.  He was transferred to [facility] after a code STEMI was called and the surgical intervention was aborted.  In the cath lab an RCA DES was placed and an IABP inserted for MI and cardiogenic shock.  While in cath lab, he required shock x6 for VF arrest and post return to ICU, shock x5.     SUBJECTIVE: RN reports pt remains on multiple pressors & hyperglycemia.  IABP 1:1.  No acute events this am.  Family at bedside.  Pt woke and followed commands on am assessment.  RN attempted to wean dopamine off but BP fell immediately to 60's.    VITAL SIGNS: Temp:  [98 F (36.7 C)-102.6 F (39.2 C)] 98 F (36.7 C) (12/03 1124) Pulse Rate:  [0-281] 90 (12/03 0752) Resp:  [0-35] 30 (12/03 1200) BP: (66-221)/(40-186) 129/88 mmHg (12/03 1100) SpO2:  [0 %-100 %] 97 % (12/03 1200) Arterial Line BP: (66-115)/(42-91) 115/79 mmHg (12/03 1200) FiO2 (%):  [60 %-100 %] 80 % (12/03 1217) Weight:  [262 lb 14.4 oz (119.25 kg)] 262 lb 14.4 oz (119.25 kg) (12/03 0400)   HEMODYNAMICS: CVP:  [14 mmHg-20 mmHg] 18 mmHg   VENTILATOR SETTINGS: Vent Mode:  [-] PRVC FiO2 (%):  [60 %-100 %] 80 % Set Rate:  [14 bmp-35 bmp] 30 bmp Vt Set:  [580 mL-680 mL] 590 mL PEEP:  [5 cmH20-8 cmH20] 8 cmH20 Plateau Pressure:  [15 cmH20-31 cmH20] 22 cmH20   INTAKE / OUTPUT:  Intake/Output Summary (Last 24 hours) at 05/29/15 1326 Last data filed at 05/29/15 1200  Gross per 24 hour  Intake 18882.29 ml  Output   1723 ml  Net 17159.29 ml    PHYSICAL EXAMINATION: General:  Obese  male on vent, appears critically ill  Neuro:  Sedated HEENT:  ETT, L IJ TLC with small amt blood at site Cardiovascular:  IABP mechanical noise, SR on monitor in 80's Lungs:  Even/non-labored, lungs bilaterally coarse Abdomen:  Soft, normal bowel sounds. No peritoneal signs Musculoskeletal:   Normal tone, no acute deformities  Skin:  Cool extremities.  LABS:  CBC  Recent Labs Lab 06/21/2015 0445 06/02/2015 1852 05/29/15 0445 05/29/15 0502  WBC 19.5* 17.1* 21.4*  --   HGB 14.9 13.4 13.5 15.0  HCT 44.2 40.8 42.2 44.0  PLT 239 209 189  --    Coag's No results for input(s): APTT, INR in the last 168 hours.   BMET  Recent Labs Lab 06/25/2015 1852 05/29/15 0050 05/29/15 0445 05/29/15 0502  NA 137 138 132* 133*  K 3.1* 3.3* 3.3* 3.3*  CL 107 106 98* 94*  CO2 24 22 19*  --   BUN CREATININE 1.51* 1.49* 2.24* 1.90*  GLUCOSE 132* 128* 311* 312*   Electrolytes  Recent Labs Lab 06/15/2015 1852 06/06/2015 2307 05/29/15 0050 05/29/15 0445  CALCIUM 11.1*  --  11.4* 7.8*  MG  --  1.4*  --  1.7   Sepsis Markers  Recent Labs Lab 05/29/15 0314 05/29/15 0457  LATICACIDVEN 6.95* 6.9*   ABG  Recent Labs Lab 05/29/15 0220 05/29/15 0331 05/29/15 0700  PHART 7.118* 7.214* 7.333*  PCO2ART 51.7* 43.4 37.7  PO2ART 73.0* 85.0 72.6*   Liver Enzymes  Recent Labs Lab 06/23/2015 0445 06/11/2015 1852  AST 20 25  ALT 17 16*  ALKPHOS 86 60  BILITOT 0.7 1.3*  ALBUMIN 4.1 3.0*   Cardiac Enzymes  Recent Labs Lab 05/29/2015 2309 05/29/15 0445 05/29/15 1000  TROPONINI >65.00* >65.00* >65.00*   Glucose  Recent Labs Lab 06/20/2015 1223 06/05/2015 2311 05/29/15 0712 05/29/15 1121  GLUCAP 98 172* 224* 222*    Imaging Dg Chest Portable 1 View  05/29/2015  CLINICAL DATA:  59 year old male with central line placement EXAM: PORTABLE CHEST 1 VIEW COMPARISON:  Chest radiograph dated 12/02/2014 FINDINGS: An endotracheal tube is noted with tip approximately 7 cm above  the carina. An enteric tube is seen coursing over the mediastinum which she beyond the image cut off. A left IJ central line noted with tip over central SVC. Single-view of the chest demonstrate clear lungs. There is no pneumothorax. There has been interval widening of the mediastinal silhouette compared to the prior study. A traumatic vascular injury with hemorrhage into the mediastinum is not excluded. CT is recommended for further evaluation. The osseous structures are grossly unremarkable. IMPRESSION: Left IJ central line with tip over central SVC.  No pneumothorax. Interval widening of the mediastinum concerning for traumatic vascular injury. CT is recommended for further evaluation. Critical Value/emergent results were called by telephone at the time of interpretation on 05/29/2015 at 2:22 am to nurse [facility], who verbally acknowledged these results. Electronically Signed   By: Elgie Collard M.D.   On: 05/29/2015 02:23   Dg Abd Portable 1v  05/29/2015  CLINICAL DATA:  Orogastric tube placement EXAM: PORTABLE ABDOMEN - 1 VIEW COMPARISON:  None. FINDINGS: The orogastric tube extends into the stomach with tip in the region of the mid gastric body. Right hydronephrosis is present, with retention of contrast in the dilated right renal collecting system and proximal ureter, presumably due to the proximal ureteral calculus. IMPRESSION: Orogastric tube extends into the stomach. Right hydronephrosis with delayed excretion of contrast. Electronically Signed   By: Ellery Plunk M.D.   On: 05/29/2015 05:47     ASSESSMENT / PLAN: 59 y/o male with multiorgan dysfunction syndrome due to mixed cardiogenic shock from intra-operative MI complicated by septic shock from appendicitis without source control.  His prognosis is overall very poor as he is on supra-maximal doses multiple vaso-pressor agents, IABP and maximum ventilator support for has combined metabolic and respiratory acidosis.  PULMONARY A:   Hypoxemic and hypercapnic respiratory failure - despite max vent support. P:   PRVC 8 cc/kg Vt Wean PEEP / FiO2 for sats > 92% Goal pH >7.2 to allow pressors to function Trend CXR  Intermittent ABG  CARDIOVASCULAR L FEM IABP 12/2 >>  L IJ TLC 12/2 >>  A:  VF Arrest - intraoperatively, s/p LHC with DES to RCA Mixed cardiogenic shock - in the setting of inferior intra-operative MI and septic shock.  IABP 1:1.  ScVO2 72 Torsades / Monomorphic VT, Frequent PVC's - suspect in setting of ischemia  RV Infarct  Temporary Pacing Wire  P:  Continue heparin gtt and dual anti-platelet therapy. IABP 1:1 Continue Amiodarone, lidocaine Continue phenylephrine, dopamine, levophed and vasopressin  ASA  Stress dose steroids  CVP Q4, goal 15 or higher for inferior MI Cardiology Following  Trend troponin Follow up  ECHO   RENAL A:   AGMA/NAGMA: 2/2 oliguric renal failure and lactic acidemia. Hyponatremia  Acute Kidney Injury - in setting of arrest / hypoperfusion  P:   Vent support as above Bicarb gtt and pushes to maintain pH >7.2 Monitor UOP / BMP  Replace electrolytes as indicated  Trend lactic acid    GASTROINTESTINAL A:   Appendicitis - unable to complete surgery for source control.  No evidence of peritonitis or rupture P:   NPO  PPI  Surgery will take the patient to the OR if stablizes.  HEMATOLOGIC A:   Dual Antiplatelet therapy and heparin gtt. P:  Aggrenox / Plavix Heparin gtt  INFECTIOUS A:   Septic shock - secondary to appendicitis without source control. P:   BCx2 12/2 >>  ABX: Vanc 12/2 >>  Zosyn 12/2 >>  Flagyl 12/2 >>   Monitor fever curve / WBC Consider anti-fungal if further decompensation   ENDOCRINE A:   Relative adrenal insufficiency.   P:   Hydrocortisone 100mg  IV Q8hours Adjust SSI to resistant scale, CBG Q4  NEUROLOGIC A:   ICU Associated Pain  P:   RASS goal: 0 Fentanyl gtt for pain Versed for sedation   FAMILY  - Updates:   Sister and Son updated at bedside.  Continue all current measures.  NO CPR in the event of arrest.  Shock / drugs ok.     Canary Brim, NP-C [facility] Pgr: [phone] or if no answer [phone] 05/29/2015, 1:26 PM

## 2015-05-29 NOTE — Procedures (Signed)
CENTRAL VENOUS CATHETER INSERTION   Indication: Shock Consent: yes Time out: yes Appropriate position: yes Hand washing: yes Patient Sterilized and Draped: yes Location: Left IJ # of Attempts: 1 Ultrasound Guidance: yes Wire Confirmed with US: yes Insertion depth: 20 cm All Ports Draw and flush: yes CXR:   Pneumothorax: no  Line position appropriate: yes Line sutured in place: yes EBL: 5 cc Complications: no Patient Tolerated Procedure Well: yes     Scott Grinage, DO., MS [facility]  

## 2015-05-29 NOTE — Consult Note (Signed)
ANTICOAGULATION CONSULT NOTE - Initial Consult  Pharmacy Consult for Heparin Indication: IABP  No Known Allergies  Patient Measurements: Height:  (177.8 cm) Weight: 262 lb 14.4 oz (119.25 kg) IBW/kg (Calculated) : 73 Heparin Dosing Weight: ~99.5kg  Vital Signs: Temp: 99.2 F (37.3 C) (12/03 1600) Temp Source: Oral (12/03 1600) BP: 110/76 mmHg (12/03 1900) Pulse Rate: 85 (12/03 1739)  Labs:  Recent Labs  06/15/2015 0445  06/14/2015 1852 06/11/2015 1853 06/02/2015 2309  05/29/15 0445 05/29/15 0502 05/29/15 1000 05/29/15 1618 05/29/15 1735  HGB 14.9  --  13.4  --   --   --  13.5 15.0  --   --   --   HCT 44.2  --  40.8  --   --   --  42.2 44.0  --   --   --   PLT 239  --  209  --   --   --  189  --   --   --   --   HEPARINUNFRC  --   --   --   --   --   --   --   --  0.11*  --  <0.10*  CREATININE 1.04  --  1.51*  --   --   < > 2.24* 1.90*  --  1.94*  --   CKTOTAL  --   --   --  49  --   --   --   --   --   --   --   CKMB  --   --   --  8.9*  --   --   --   --   --   --   --   TROPONINI  --   < > 0.65*  --  >65.00*  --  >65.00*  --  >65.00*  --   --   < > = values in this interval not displayed.  Estimated Creatinine Clearance: 53.1 mL/min (by C-G formula based on Cr of 1.94).   Medical History: Past Medical History  Diagnosis Date  . Kidney calculi   . Hypertension   . History of TIAs   . Arthritis   . Back pain     trouble with lumbar 2, 3, and 4 - degenerative disease per pt  . Unspecified transient cerebral ischemia   . HA (headache)   . Syncope and collapse   . BPH (benign prostatic hyperplasia)   . GERD (gastroesophageal reflux disease)   . Diabetes mellitus without complication (HCC)   . Seizure (HCC)   . BPH (benign prostatic hyperplasia)   . Sarcoidosis of lung (HCC)   . Tobacco abuse    Assessment: 58yom presented on 06/04/2015 to [facility] with acute appendicitis and was taken to the OR for a laparoscopic appendectomy. In the OR he went into  cardiac arrest. EKG showed CHB and ST elevations so Code STEMI was called. He was transferred to [facility] and taken emergently to the cath lab where he received a BMS to his RCA, temporary pacemaker and IABP were placed. He was started on heparin. He has also been continued on aggrastat x 18 hours.  Initial HL is below goal at 0.11, subsequent HL < 0.1, no interruptions in gtt per nurse. CBC remains wnl and stable. Per RN some red tinged urine but still continue with heparin. Will continue to monitor closely.    Goal of Therapy:  Heparin level 0.2-0.5 units/ml Monitor platelets by anticoagulation protocol:  Yes   Plan:  1) Increase heparin to 1850 units/hr with no bolus 2) Check 6 hour heparin level 3) Daily heparin level and CBC  Isaac Bliss, PharmD, BCPS, [facility] Clinical Pharmacist Pager [phone] 05/29/2015 7:58 PM

## 2015-05-29 NOTE — Progress Notes (Signed)
CRITICAL VALUE ALERT  Critical value received:  Lactic acid 3.9  Date of notification:  05/29/2015  Time of notification:  1650  Critical value read back:Yes.    Nurse who received alert:  Dayna Barker, RN  MD notified (1st page):  Canary Brim, NP already aware previous Lactic acid was 6.9.  Time of first page:  1650  MD notified (2nd page):  Time of second page:  Responding MD:  Canary Brim, NP  Time MD responded:  [phone]

## 2015-05-29 NOTE — Progress Notes (Addendum)
   Still in cardiogenic shock due to inferior infarction and RV involvement. Monomorphic V. tach 3 this evening with one episode related to R onT pacing. He was successfully defibrillated on each occasion.    He is now 23 L positive since admission   Positive arm and lower extremity edema   Still requiring multiple pressors (levophed, dopamine and Neo-Synephrine).   Echocardiogram demonstrates LVEF 20-25%. Dilated right ventricle and marked reduction in systolic contractility.    Urine output is decreasing.   Overall, the patient is not doing well. He remains in shock.  Intra-aortic balloon pump still in place. I have turned the pacemaker off to prevent further episodes of ventricular tachycardia. Prognosis is poor.    Will had inotropic support (milrinone) and hope to wean Neo-Synephrine and Levophed.  Decrease IV fluid rate   Watch urine output closely  and may need IV Lasix challenge.  Theoretically, the best approach at this time would be Impella CP and RP for biventricular support. In the setting of sepsis and current clinical situation with full anticoagulation and platelet inhibition, will continue with current level of support and reassess in a.m.  Term pacemaker off.

## 2015-05-30 ENCOUNTER — Inpatient Hospital Stay (HOSPITAL_COMMUNITY): Payer: Medicaid Other

## 2015-05-30 DIAGNOSIS — I219 Acute myocardial infarction, unspecified: Secondary | ICD-10-CM

## 2015-05-30 DIAGNOSIS — I481 Persistent atrial fibrillation: Secondary | ICD-10-CM

## 2015-05-30 DIAGNOSIS — I5021 Acute systolic (congestive) heart failure: Secondary | ICD-10-CM

## 2015-05-30 DIAGNOSIS — J9600 Acute respiratory failure, unspecified whether with hypoxia or hypercapnia: Secondary | ICD-10-CM | POA: Insufficient documentation

## 2015-05-30 DIAGNOSIS — N179 Acute kidney failure, unspecified: Secondary | ICD-10-CM

## 2015-05-30 DIAGNOSIS — I4891 Unspecified atrial fibrillation: Secondary | ICD-10-CM

## 2015-05-30 DIAGNOSIS — J9601 Acute respiratory failure with hypoxia: Secondary | ICD-10-CM

## 2015-05-30 DIAGNOSIS — R570 Cardiogenic shock: Secondary | ICD-10-CM

## 2015-05-30 LAB — BASIC METABOLIC PANEL
Anion gap: 10 (ref 5–15)
Anion gap: 10 (ref 5–15)
BUN: 25 mg/dL — AB (ref 6–20)
BUN: 31 mg/dL — ABNORMAL HIGH (ref 6–20)
CHLORIDE: 93 mmol/L — AB (ref 101–111)
CHLORIDE: 91 mmol/L — AB (ref 101–111)
CO2: 26 mmol/L (ref 22–32)
CO2: 28 mmol/L (ref 22–32)
CREATININE: 1.8 mg/dL — AB (ref 0.61–1.24)
CREATININE: 1.86 mg/dL — AB (ref 0.61–1.24)
Calcium: 6.5 mg/dL — ABNORMAL LOW (ref 8.9–10.3)
Calcium: 6.3 mg/dL — CL (ref 8.9–10.3)
GFR calc non Af Amer: 40 mL/min — ABNORMAL LOW (ref >60)
GFR, EST AFRICAN AMERICAN: 46 mL/min — AB (ref >60)
GFR, EST AFRICAN AMERICAN: 44 mL/min — AB (ref >60)
GFR, EST NON AFRICAN AMERICAN: 38 mL/min — AB (ref >60)
Glucose, Bld: 162 mg/dL — ABNORMAL HIGH (ref 65–99)
Glucose, Bld: 192 mg/dL — ABNORMAL HIGH (ref 65–99)
POTASSIUM: 3.5 mmol/L (ref 3.5–5.1)
POTASSIUM: 3 mmol/L — AB (ref 3.5–5.1)
SODIUM: 129 mmol/L — AB (ref 135–145)
Sodium: 129 mmol/L — ABNORMAL LOW (ref 135–145)

## 2015-05-30 LAB — POCT I-STAT 3, ART BLOOD GAS (G3+)
Acid-Base Excess: 3 mmol/L — ABNORMAL HIGH (ref 0.0–2.0)
Bicarbonate: 26.8 mEq/L — ABNORMAL HIGH (ref 20.0–24.0)
O2 Saturation: 90 %
PCO2 ART: 36.5 mmHg (ref 35.0–45.0)
Patient temperature: 37
TCO2: 28 mmol/L (ref 0–100)
pH, Arterial: 7.474 — ABNORMAL HIGH (ref 7.350–7.450)
pO2, Arterial: 55 mmHg — ABNORMAL LOW (ref 80.0–100.0)

## 2015-05-30 LAB — HEPARIN LEVEL (UNFRACTIONATED)
HEPARIN UNFRACTIONATED: 0.24 [IU]/mL — AB (ref 0.30–0.70)
Heparin Unfractionated: <0.1 IU/mL — ABNORMAL LOW (ref 0.30–0.70)

## 2015-05-30 LAB — GLUCOSE, CAPILLARY
GLUCOSE-CAPILLARY: 164 mg/dL — AB (ref 65–99)
GLUCOSE-CAPILLARY: 178 mg/dL — AB (ref 65–99)
GLUCOSE-CAPILLARY: 179 mg/dL — AB (ref 65–99)
GLUCOSE-CAPILLARY: 104 mg/dL — AB (ref 65–99)
Glucose-Capillary: 155 mg/dL — ABNORMAL HIGH (ref 65–99)
Glucose-Capillary: 184 mg/dL — ABNORMAL HIGH (ref 65–99)

## 2015-05-30 LAB — CBC
HEMATOCRIT: 38.6 % — AB (ref 39.0–52.0)
HEMOGLOBIN: 12.9 g/dL — AB (ref 13.0–17.0)
MCH: 29.7 pg (ref 26.0–34.0)
MCHC: 33.4 g/dL (ref 30.0–36.0)
MCV: 88.7 fL (ref 78.0–100.0)
Platelets: 220 10*3/uL (ref 150–400)
RBC: 4.35 MIL/uL (ref 4.22–5.81)
RDW: 14.8 % (ref 11.5–15.5)
WBC: 18.8 10*3/uL — ABNORMAL HIGH (ref 4.0–10.5)

## 2015-05-30 LAB — CARBOXYHEMOGLOBIN
CARBOXYHEMOGLOBIN: 0.9 % (ref 0.5–1.5)
Carboxyhemoglobin: 0.9 % (ref 0.5–1.5)
Carboxyhemoglobin: 0.5 % (ref 0.5–1.5)
Methemoglobin: 1 % (ref 0.0–1.5)
Methemoglobin: 1.1 % (ref 0.0–1.5)
Methemoglobin: 1.2 % (ref 0.0–1.5)
O2 SAT: 48 %
O2 SAT: 69.1 %
O2 Saturation: 47.6 %
TOTAL HEMOGLOBIN: 12.8 g/dL — AB (ref 13.5–18.0)
TOTAL HEMOGLOBIN: 12.3 g/dL — AB (ref 13.5–18.0)
TOTAL HEMOGLOBIN: 12.3 g/dL — AB (ref 13.5–18.0)

## 2015-05-30 LAB — MAGNESIUM: Magnesium: 1.8 mg/dL (ref 1.7–2.4)

## 2015-05-30 MED ORDER — SODIUM CHLORIDE 0.9 % IV SOLN
1.0000 g | Freq: Once | INTRAVENOUS | Status: AC
  Administered 2015-05-30: 1 g via INTRAVENOUS
  Filled 2015-05-30: qty 10

## 2015-05-30 MED ORDER — MAGNESIUM SULFATE 2 GM/50ML IV SOLN
INTRAVENOUS | Status: AC
  Filled 2015-05-30: qty 50

## 2015-05-30 MED ORDER — AMIODARONE LOAD VIA INFUSION
150.0000 mg | Freq: Once | INTRAVENOUS | Status: AC
  Administered 2015-05-30: 150 mg via INTRAVENOUS

## 2015-05-30 MED ORDER — HEPARIN (PORCINE) IN NACL 100-0.45 UNIT/ML-% IJ SOLN
2400.0000 [IU]/h | INTRAMUSCULAR | Status: DC
  Administered 2015-05-30 – 2015-05-31 (×3): 2500 [IU]/h via INTRAVENOUS
  Administered 2015-06-01 (×2): 2400 [IU]/h via INTRAVENOUS
  Filled 2015-05-30 (×5): qty 250

## 2015-05-30 MED ORDER — MAGNESIUM SULFATE 2 GM/50ML IV SOLN
2.0000 g | Freq: Once | INTRAVENOUS | Status: AC
  Administered 2015-05-30: 2 g via INTRAVENOUS

## 2015-05-30 MED ORDER — FUROSEMIDE 10 MG/ML IJ SOLN
80.0000 mg | Freq: Once | INTRAMUSCULAR | Status: AC
  Administered 2015-05-30: 80 mg via INTRAVENOUS

## 2015-05-30 MED ORDER — MILRINONE IN DEXTROSE 20 MG/100ML IV SOLN
0.3750 ug/kg/min | INTRAVENOUS | Status: DC
  Administered 2015-05-31 – 2015-06-09 (×36): 0.375 ug/kg/min via INTRAVENOUS
  Filled 2015-05-30 (×37): qty 100

## 2015-05-30 MED ORDER — SODIUM CHLORIDE 0.9 % IJ SOLN: 10.0000 mL | INTRAMUSCULAR | Status: DC | PRN

## 2015-05-30 MED ORDER — POTASSIUM CHLORIDE 10 MEQ/50ML IV SOLN
10.0000 meq | INTRAVENOUS | Status: AC
  Administered 2015-05-30 (×4): 10 meq via INTRAVENOUS
  Filled 2015-05-30 (×4): qty 50

## 2015-05-30 MED ORDER — FUROSEMIDE 10 MG/ML IJ SOLN
30.0000 mg/h | INTRAVENOUS | Status: DC
  Administered 2015-05-30 – 2015-05-31 (×3): 15 mg/h via INTRAVENOUS
  Administered 2015-06-01: 20 mg/h via INTRAVENOUS
  Administered 2015-06-02: 30 mg/h via INTRAVENOUS
  Filled 2015-05-30 (×9): qty 25

## 2015-05-30 MED ORDER — FUROSEMIDE 10 MG/ML IJ SOLN
INTRAMUSCULAR | Status: AC
  Filled 2015-05-30: qty 8

## 2015-05-30 MED ORDER — SODIUM CHLORIDE 0.9 % IJ SOLN
10.0000 mL | Freq: Two times a day (BID) | INTRAMUSCULAR | Status: DC
  Administered 2015-05-30 – 2015-06-07 (×13): 10 mL
  Administered 2015-06-07: 30 mL
  Administered 2015-06-08 – 2015-06-09 (×3): 10 mL

## 2015-05-30 MED ORDER — POTASSIUM CHLORIDE 20 MEQ/15ML (10%) PO SOLN
20.0000 meq | Freq: Once | ORAL | Status: AC
  Administered 2015-05-30: 20 meq via ORAL
  Filled 2015-05-30: qty 15

## 2015-05-30 MED ORDER — FUROSEMIDE 10 MG/ML IJ SOLN
40.0000 mg | Freq: Once | INTRAMUSCULAR | Status: AC
  Administered 2015-05-30: 40 mg via INTRAVENOUS
  Filled 2015-05-30: qty 4

## 2015-05-30 MED ORDER — POTASSIUM CHLORIDE 10 MEQ/50ML IV SOLN
10.0000 meq | INTRAVENOUS | Status: AC
  Administered 2015-05-30 (×3): 10 meq via INTRAVENOUS
  Filled 2015-05-30 (×3): qty 50

## 2015-05-30 NOTE — Progress Notes (Signed)
ANTICOAGULATION CONSULT NOTE - Follow Up Consult  Pharmacy Consult for heparin Indication: IABP  Labs:  Recent Labs  05/29/2015 1852 05/29/2015 1853 06/25/2015 2309  05/29/15 0445 05/29/15 0502 05/29/15 1000 05/29/15 1618 05/29/15 1735 05/30/15 0315 05/30/15 0420  HGB 13.4  --   --   --  13.5 15.0  --   --   --  12.9*  --   HCT 40.8  --   --   --  42.2 44.0  --   --   --  38.6*  --   PLT 209  --   --   --  189  --   --   --   --  220  --   HEPARINUNFRC  --   --   --   --   --   --  0.11*  --  <0.10*  --  <0.10*  CREATININE 1.51*  --   --   < > 2.24* 1.90*  --  1.94*  --   --  1.80*  CKTOTAL  --  49  --   --   --   --   --   --   --   --   --   CKMB  --  8.9*  --   --   --   --   --   --   --   --   --   TROPONINI 0.65*  --  >65.00*  --  >65.00*  --  >65.00*  --   --   --   --   < > = values in this interval not displayed.   Assessment: 59yo male remains undetectable on heparin after rate increase; RN reports difficulty flushing IV line but no clear issues with IV line.  Goal of Therapy:  Heparin level 0.2-0.5 units/ml   Plan:  Will increase heparin gtt by 2 units/kg/hr to 2100 units/hr and check level in 6hr.  Vernard Gambles, PharmD, BCPS  05/30/2015,5:13 AM

## 2015-05-30 NOTE — Progress Notes (Addendum)
Patient Name: Scott Holden Date of Encounter: 05/30/2015    SUBJECTIVE: Patient arouses with verbal stimuli. He remains in cardiogenic shock and echo yesterday demonstrated biventricular systolic failure.  TELEMETRY:  Atrial fibrillation with moderate rapid ventricular response Filed Vitals:   05/30/15 0500 05/30/15 0600 05/30/15 0700 05/30/15 0749  BP: 107/68 94/80 109/79 94/71  Pulse:      Temp:      TempSrc:      Resp: 22 30  26   Height:      Weight: 331 lb 5.6 oz (150.3 kg)     SpO2: 94% 91%  89%    Intake/Output Summary (Last 24 hours) at 05/30/15 0801 Last data filed at 05/30/15 0700  Gross per 24 hour  Intake 16745.83 ml  Output    830 ml  Net 15915.83 ml   LABS: Basic Metabolic Panel:  Recent Labs  62/70/35 1618 05/30/15 0420 05/30/15 0534  NA 129* 129*  --   K 4.1 3.5  --   CL 96* 93*  --   CO2 23 26  --   GLUCOSE 207* 162*  --   BUN 20 25*  --   CREATININE 1.94* 1.80*  --   CALCIUM 6.6* 6.5*  --   MG 1.8  --  1.8   CBC:  Recent Labs  06/25/2015 1852 05/29/15 0445 05/29/15 0502 05/30/15 0315  WBC 17.1* 21.4*  --  18.8*  NEUTROABS 15.4*  --   --   --   HGB 13.4 13.5 15.0 12.9*  HCT 40.8 42.2 44.0 38.6*  MCV 91.7 91.5  --  88.7  PLT 209 189  --  220   Cardiac Enzymes:  Recent Labs  06/15/2015 1853 05/30/2015 2309 05/29/15 0445 05/29/15 1000  CKTOTAL 49  --   --   --   CKMB 8.9*  --   --   --   TROPONINI  --  >65.00* >65.00* >65.00*     Radiology/Studies:  Chest x-ray not yet performed today.  Physical Exam: Blood pressure 94/71, pulse 118, temperature 99.1 F (37.3 C), temperature source Oral, resp. rate 26, height 5\' 10"  (1.778 m), weight 331 lb 5.6 oz (150.3 kg), SpO2 89 %. Weight change: 68 lb 7.2 oz (31.05 kg)  Wt Readings from Last 3 Encounters:  05/30/15 331 lb 5.6 oz (150.3 kg)  12/03/14 293 lb 12.8 oz (133.267 kg)  11/11/14 295 lb (133.811 kg)   Obese Opens eyes to verbal challenge Clear lungs  anteriorly Cardiac exam reveals irregularly irregular rhythm Abdomen is soft. Extremities or edema this.  ASSESSMENT:  1. Acute inferior and right ventricular infarction with resulting cardiogenic shock. Echo demonstrates right ventricular systolic failure. 2. Acute left ventricular systolic heart failure (EF 20%) secondary to inferior infarction and also presumed stunned myocardium from multiple electrical cardioversions/tachycardia/hypotension. Milrinone added last evening. 3. Acute appendicitis with presumed sepsis on broad-spectrum antibiotic coverage 4. Increasing oxygen requirement related to pulmonary edema 5. Cardiogenic shock still requiring pressors. Neo-Synephrine is been discontinued. 6. Atrial fibrillation with moderate increased ventricular response. After additional boluses of amiodarone, cardioversion was attempted but the patient did not revert to normal sinus rhythm 7. Acute kidney injury, stable but with decreasing urine output  Plan:  1. Continue IV amiodarone to prevent recurrent ventricular arrhythmia and also to hopefully help settle/revert atrial fibrillation 2. Continue to wean vasopressor support as tolerated, i.e. Levophed 3. Continue inotropic support with milrinone/dopamine 4. It is a bit late in the game  but consider advancing mechanical support to Impella CP. We don't have Impella RP available. 5. Consider diuresis 6. Decrease IV fluid as tolerated by hemodynamics 7. Overall prognosis remains poor given biventricular failure, superimposed development of atrial arrhythmia, and sepsis. 8. Will speak with heart failure team. I do not think an implanted right/left ventricular support device will be possible in the setting of sepsis and appendicitis.    Windy Fast W 05/30/2015, 8:01 AM

## 2015-05-30 NOTE — Consult Note (Signed)
ANTICOAGULATION CONSULT NOTE - Initial Consult  Pharmacy Consult for Heparin Indication: IABP  No Known Allergies  Patient Measurements: Height: 5\' 10"  (177.8 cm) Weight: (!) 331 lb 5.6 oz (150.3 kg) IBW/kg (Calculated) : 73 Heparin Dosing Weight: ~99.5kg  Vital Signs: Temp: 98.9 F (37.2 C) (12/04 0800) Temp Source: Oral (12/04 0800) BP: 103/79 mmHg (12/04 1300) Pulse Rate: 118 (12/04 0359)  Labs:  Recent Labs  05/27/2015 1852 06/19/2015 1853 06/15/2015 2309  05/29/15 0445 05/29/15 0502  05/29/15 1000 05/29/15 1618 05/29/15 1735 05/30/15 0315 05/30/15 0420 05/30/15 1335  HGB 13.4  --   --   --  13.5 15.0  --   --   --   --  12.9*  --   --   HCT 40.8  --   --   --  42.2 44.0  --   --   --   --  38.6*  --   --   PLT 209  --   --   --  189  --   --   --   --   --  220  --   --   HEPARINUNFRC  --   --   --   --   --   --   < > 0.11*  --  <0.10*  --  <0.10* <0.10*  CREATININE 1.51*  --   --   < > 2.24* 1.90*  --   --  1.94*  --   --  1.80*  --   CKTOTAL  --  49  --   --   --   --   --   --   --   --   --   --   --   CKMB  --  8.9*  --   --   --   --   --   --   --   --   --   --   --   TROPONINI 0.65*  --  >65.00*  --  >65.00*  --   --  >65.00*  --   --   --   --   --   < > = values in this interval not displayed.  Estimated Creatinine Clearance: 64.9 mL/min (by C-G formula based on Cr of 1.8).   Medical History: Past Medical History  Diagnosis Date  . Kidney calculi   . Hypertension   . History of TIAs   . Arthritis   . Back pain     trouble with lumbar 2, 3, and 4 - degenerative disease per pt  . Unspecified transient cerebral ischemia   . HA (headache)   . Syncope and collapse   . BPH (benign prostatic hyperplasia)   . GERD (gastroesophageal reflux disease)   . Diabetes mellitus without complication (HCC)   . Seizure (HCC)   . BPH (benign prostatic hyperplasia)   . Sarcoidosis of lung (HCC)   . Tobacco abuse    Assessment: 58yom presented on 06/06/2015 to  [facility] with acute appendicitis and was taken to the OR for a laparoscopic appendectomy. In the OR he went into cardiac arrest. EKG showed CHB and ST elevations so Code STEMI was called. He was transferred to [facility] and taken emergently to the cath lab where he received a BMS to his RCA, temporary pacemaker and IABP were placed. He was started on heparin. He has also been continued on aggrastat x 18 hours.  Despite multiple rate increases, HL  remains undetectable. Heparin is infusing peripherally, no interruptions in gtt and no infiltration noted. H/H has trended down but plt remain wnl and stable. Still having some pink-red tinged urine but no other significant s/s bleeding. Will continue to monitor closely.    Goal of Therapy:  Heparin level 0.2-0.5 units/ml Monitor platelets by anticoagulation protocol: Yes   Plan:  1) Increase heparin to 2500 units/hr  2) Check 6 hour heparin level 3) Daily heparin level and CBC   Archie Atilano K. Bonnye Fava, PharmD, BCPS, CPP Clinical Pharmacist Pager: [phone] Phone: [phone] 05/30/2015 2:41 PM

## 2015-05-30 NOTE — Progress Notes (Signed)
Transvenous pacemaker removed from left venous sheath per Dr. Katrinka Blazing order. Will continue to monitor.

## 2015-05-30 NOTE — Progress Notes (Signed)
PULMONARY / CRITICAL CARE MEDICINE   Name: Scott Holden MRN: [id number] DOB: 1955-08-23    ADMISSION DATE:  06/25/2015 CONSULTATION DATE:  05/27/2015   REFERRING MD :  Dr. Clifton James  CHIEF COMPLAINT:  Cardiogenic and septic shock  INITIAL PRESENTATION:  46M presented to [facility] with several days of progressively worsening RLQ pain found to have an acute appendicitis.  He went to the OR and suffered a VT arrest on the table after the insertion of the trochar.  He was transferred to [facility] after a code STEMI was called and the surgical intervention was aborted.  In the cath lab an RCA DES was placed and an IABP inserted for MI and cardiogenic shock.  While in cath lab, he required shock x6 for VF arrest and post return to ICU, shock x5.     SUBJECTIVE: RN reports pt remains on multiple pressors.  Neosynephrine weaned off but increased levophed requirement.  Remains on IABP 1:1.  ECHO from 12/4 demonstrated biventricular systolic failure.  Increased O2 / PEEP requirements overnight.  Developed AF w RVR early am.  CVP 17 (on 400 ml/hr + combined fluid)  VITAL SIGNS: Temp:  [98 F (36.7 C)-99.2 F (37.3 C)] 99.1 F (37.3 C) (12/04 0400) Pulse Rate:  [84-118] 118 (12/04 0359) Resp:  [22-30] 26 (12/04 0749) BP: (80-129)/(61-88) 82/66 mmHg (12/04 0800) SpO2:  [89 %-97 %] 89 % (12/04 0749) Arterial Line BP: (63-131)/(50-100) 93/75 mmHg (12/04 0600) FiO2 (%):  [80 %-100 %] 100 % (12/04 0749) Weight:  [331 lb 5.6 oz (150.3 kg)] 331 lb 5.6 oz (150.3 kg) (12/04 0500)   HEMODYNAMICS: CVP:  [12 mmHg-21 mmHg] 17 mmHg   VENTILATOR SETTINGS: Vent Mode:  [-] PRVC FiO2 (%):  [80 %-100 %] 100 % Set Rate:  [30 bmp] 30 bmp Vt Set:  [590 mL] 590 mL PEEP:  [8 cmH20-10 cmH20] 10 cmH20 Plateau Pressure:  [22 cmH20-27 cmH20] 24 cmH20   INTAKE / OUTPUT:  Intake/Output Summary (Last 24 hours) at 05/30/15 4098 Last data filed at 05/30/15 0700  Gross per 24 hour  Intake 16283.94 ml  Output    830 ml  Net  15453.94 ml    PHYSICAL EXAMINATION: General:  Obese male on vent, critically ill appearing Neuro:  Sedated HEENT:  ETT, L IJ TLC, MM pink/moist Cardiovascular:  IABP mechanical noise, AF on monitor, distant tones  Lungs:  Even/non-labored, lungs bilaterally coarse with rhonchi  Abdomen:  Soft, normal bowel sounds. No peritoneal signs Musculoskeletal:   Normal tone, no acute deformities  Skin:  Cool extremities, generalized edema 1-2+  LABS:  CBC  Recent Labs Lab 06/26/2015 1852 05/29/15 0445 05/29/15 0502 05/30/15 0315  WBC 17.1* 21.4*  --  18.8*  HGB 13.4 13.5 15.0 12.9*  HCT 40.8 42.2 44.0 38.6*  PLT 209 189  --  220   Coag's No results for input(s): APTT, INR in the last 168 hours.   BMET  Recent Labs Lab 05/29/15 0445 05/29/15 0502 05/29/15 1618 05/30/15 0420  NA 132* 133* 129* 129*  K 3.3* 3.3* 4.1 3.5  CL 98* 94* 96* 93*  CO2 19*  --  23 26  BUN 25*  CREATININE 2.24* 1.90* 1.94* 1.80*  GLUCOSE 311* 312* 207* 162*   Electrolytes  Recent Labs Lab 05/29/15 0445 05/29/15 1618 05/30/15 0420 05/30/15 0534  CALCIUM 7.8* 6.6* 6.5*  --   MG 1.7 1.8  --  1.8   Sepsis Markers  Recent Labs Lab 05/29/15  3244 05/29/15 0457 05/29/15 1456  LATICACIDVEN 6.95* 6.9* 3.9*   ABG  Recent Labs Lab 05/29/15 0220 05/29/15 0331 05/29/15 0700  PHART 7.118* 7.214* 7.333*  PCO2ART 51.7* 43.4 37.7  PO2ART 73.0* 85.0 72.6*   Liver Enzymes  Recent Labs Lab June 14, 2015 0445 14-Jun-2015 1852  AST 20 25  ALT 17 16*  ALKPHOS 86 60  BILITOT 0.7 1.3*  ALBUMIN 4.1 3.0*   Cardiac Enzymes  Recent Labs Lab June 14, 2015 2309 05/29/15 0445 05/29/15 1000  TROPONINI >65.00* >65.00* >65.00*   Glucose  Recent Labs Lab 05/29/15 0712 05/29/15 1121 05/29/15 1611 05/29/15 1944 05/29/15 2323 05/30/15 0312  GLUCAP 224* 222* 187* 171* 160* 155*    Imaging No results found.   ASSESSMENT / PLAN: 59 y/o male with multiorgan dysfunction syndrome due to  mixed cardiogenic shock from intra-operative MI complicated by septic shock from appendicitis without source control.  His prognosis is overall very poor as he is on supra-maximal doses multiple vaso-pressor agents, IABP and maximum ventilator support for has combined metabolic and respiratory acidosis.  PULMONARY A:  Hypoxemic and hypercapnic respiratory failure - despite max vent support. P:   PRVC 8 cc/kg Vt Wean PEEP / FiO2 for sats > 92% Goal pH >7.2 to allow pressors to function Trend CXR  Intermittent ABG Await 12/4 CXR    CARDIOVASCULAR L FEM IABP 12/2 >>  L IJ TLC 12/2 >>  A:  VF Arrest - intraoperatively, s/p LHC with DES to RCA Mixed cardiogenic shock - in the setting of inferior intra-operative MI and septic shock.  IABP 1:1.  ScVO2 72 Torsades / Monomorphic VT, Frequent PVC's - suspect in setting of ischemia  RV Infarct  Temporary Pacing Wire  P:  Continue heparin gtt and dual anti-platelet therapy. Cardiology discussing Impella but suspect he is not a candidate due to sepsis / appendicitis IABP 1:1 Continue Amiodarone, lidocaine Continue dopamine, levophed and vasopressin  ASA  Stress dose steroids  CVP Q4, goal 15 or higher with inferior MI Cardiology Following  Trend troponin Follow up ECHO   RENAL A:   AGMA/NAGMA: 2/2 oliguric renal failure and lactic acidemia. Hyponatremia  Acute Kidney Injury - in setting of arrest / hypoperfusion  P:   Vent support as above Bicarb gtt @ 200 ml/hr to maintain pH >7.2 Monitor UOP / BMP  Replace electrolytes as indicated  Trend lactic acid  NS @ 150 ml/hr Lasix per Cardiology, 40 mg IV x1  F/U ABG @ 1500 12/4   GASTROINTESTINAL A:   Appendicitis - unable to complete surgery for source control.  No evidence of peritonitis or rupture. P:   NPO  PPI  Surgery will take the patient to the OR if stablizes. Concern for poor prognosis   HEMATOLOGIC A:   Dual Antiplatelet therapy and heparin gtt. P:  Aggrenox /  Plavix Heparin gtt per pharmacy   INFECTIOUS A:   Septic shock - secondary to appendicitis without source control. P:   BCx2 12/2 >>  ABX: Vanc 12/2 >>  Zosyn 12/2 >>  Flagyl 12/2 >>   Monitor fever curve / WBC Consider anti-fungal if further decompensation   ENDOCRINE A:   Relative adrenal insufficiency.   P:   Hydrocortisone  IV Q8hours SSI to resistant scale, CBG Q4  NEUROLOGIC A:   ICU Associated Pain  P:   RASS goal: -2 with vent compliance Fentanyl gtt for pain Versed for sedation   FAMILY  - Updates:  No family at bedside.  Continue all  current measures.  NO CPR in the event of arrest.  Shock / drugs ok.     Canary Brim, NP-C [facility] Pgr: [phone] or if no answer [phone] 05/30/2015, 9:07 AM

## 2015-05-30 NOTE — Progress Notes (Signed)
eLink Physician-Brief Progress Note Patient Name: Scott Holden DOB: 07-06-1955 MRN: [id number]   Date of Service  05/30/2015  HPI/Events of Note  K 3, Ca 6.3  eICU Interventions  Repleted     Intervention Category Intermediate Interventions: Electrolyte abnormality - evaluation and management  Mckinzie Saksa 05/30/2015, 7:32 PM

## 2015-05-30 NOTE — Progress Notes (Signed)
ANTICOAGULATION CONSULT NOTE - Follow Up Consult  Pharmacy Consult for Heparin Indication: IABP  No Known Allergies  Patient Measurements: Height: 5\' 10"  (177.8 cm) Weight: (!) 331 lb 5.6 oz (150.3 kg) IBW/kg (Calculated) : 73 Heparin Dosing Weight: ~99.5 kg  Labs:  Recent Labs  06/22/2015 1852 06/23/2015 1853 06/07/2015 2309  05/29/15 0445 05/29/15 0502 05/29/15 1000 05/29/15 1618  05/30/15 0315 05/30/15 0420 05/30/15 1335 05/30/15 1715 05/30/15 2055  HGB 13.4  --   --   --  13.5 15.0  --   --   --  12.9*  --   --   --   --   HCT 40.8  --   --   --  42.2 44.0  --   --   --  38.6*  --   --   --   --   PLT 209  --   --   --  189  --   --   --   --  220  --   --   --   --   HEPARINUNFRC  --   --   --   --   --   --  0.11*  --   < >  --  <0.10* <0.10*  --  0.24*  CREATININE 1.51*  --   --   < > 2.24* 1.90*  --  1.94*  --   --  1.80*  --  1.86*  --   CKTOTAL  --  49  --   --   --   --   --   --   --   --   --   --   --   --   CKMB  --  8.9*  --   --   --   --   --   --   --   --   --   --   --   --   TROPONINI 0.65*  --  >65.00*  --  >65.00*  --  >65.00*  --   --   --   --   --   --   --   < > = values in this interval not displayed.  Estimated Creatinine Clearance: 62.8 mL/min (by C-G formula based on Cr of 1.86).  Assessment:   Heparin level is now into target range (0.24) on 2500 units/hr.     Goal of Therapy:  Heparin level 0.2-0.5 units/ml Monitor platelets by anticoagulation protocol: Yes   Plan:   Continue Heparin drip at 2500 units/hr.  Next heparin level and CBC in am.  Dennie Fetters, RPh Pager: [phone] 05/30/2015,9:58 PM

## 2015-05-30 NOTE — Progress Notes (Addendum)
  Asked by Dr. Katrinka Blazing to evaluate patient due to shock and biventricular failure.   59 y/o morbidly obese male with COPD and DM2 admitted with acute appendicitis. Developed cardiac arrest in OR (prior to removal of appendix) with inferior ST elevation. Underwent emergent cath on 06-03-15. Cath with totally occluded RCA (left system ok) and underwent PCI with stenting and placement of IABP for shock.   Since that time has remained intubated with persistent respiratory failure and shock. Now on multiple pressors. Has also had multiple shock for VT. Now on lido and amio. This am developed new onset AF and underwent attempt at [facility] which was unsuccessful.   Echo images reviewed personally. LVEF ~ 25% RV severely hypokinetic. No significant MR or any other obvious mechanical complications.   Currently on dopamine, norepi and milrinone. SBP in 90s. In AF ~100.   On vent at 100% FiO2. Sats mid 80s-90s. CVP 20. Getting IVFs. PH 7.33/38/73/94% on 80%. CXR with pulmonary edema (vs ARDS). IABP low.  Weight up 20-30 pounds.   Assessment  1. Acute systolic HF with cardiogenic shock with biventricular dysfunction R>L 2. Acute inferior STEMI with RV involvement     --s/p PCI RCA on 12/2 3. Acute respiratory failure 4. AKI 5. Acute appendicitis being treated medically 6. Recurrent VT/VF 7. New onset AF 8. Hypomagnsemia  Plan/Discussion:  He is critically ill with multi-system organ failure. I think the major issue now is cardiogenic shock with R>L heart dysfunction. Will place PA catheter urgently to help guide therapy. With worsening oxygenation will stop IVF and begin diuresis. Continue pressor support and anti-arrhythmic therapy. I have dropped IABP to 1:1. Not candidate for Impella support with severe RV dysfunction. Continue abx. Prognosis extremely guarded. I repositioned IABP at the bedside. F/u CXR pending.  The Advanced HF Team will follow.   The patient is critically ill with multiple organ  systems failure and requires high complexity decision making for assessment and support, frequent evaluation and titration of therapies, application of advanced monitoring technologies and extensive interpretation of multiple databases.   Critical Care Time devoted to patient care services described in this note is 45 Minutes.    Scott Kellett,MD 12:34 PM

## 2015-05-30 NOTE — Procedures (Signed)
Pulmonary Artery Catheter Insertion Procedure Note KHALIFA EULL [id number] 18-Dec-1955  Procedure: Insertion of Pulmonary Artery Catheter Indications: Shock  Procedure Details Consent: Risks of procedure as well as the alternatives and risks of each were explained to the (patient/caregiver).  Consent for procedure obtained. Time Out: Verified patient identification, verified procedure, site/side was marked, verified correct patient position, special equipment/implants available, medications/allergies/relevent history reviewed, required imaging and test results available.  Performed  Maximum sterile technique was used including antiseptics, cap, gloves, gown, hand hygiene, mask and sheet. Skin prep: Chlorhexidine; local anesthetic administered A antimicrobial bonded/coated single lumen sheath was placed in the right internal jugular vein using the Seldinger technique. A PA catheter was then maneuvered into the PA using waveform guidance.   Evaluation Blood flow good Complications: No apparent complications Patient did tolerate procedure well. Chest X-ray ordered to verify placement.  CXR: pending.  [name] MD 05/30/2015, 12:38 PM

## 2015-05-30 NOTE — Progress Notes (Signed)
Pt. Went into what appears to be pulseless torsades at 1330 and 1341 and shocked with 150J on each occurrence and immediately converted back to NSR/Afib with frequent PVCs. Dr. Gala Romney notified and no orders given but stated he would come assess pt. Condition.

## 2015-05-30 NOTE — Progress Notes (Signed)
2 Days Post-Op  Subjective: Remains intubated, sedated IABP Remains on pressors - vasopressin, milrinone, dopamine, levophed Minimal improvement - WBC, Creatinine slightly improved  Objective: Vital signs in last 24 hours: Temp:  [98 F (36.7 C)-99.2 F (37.3 C)] 99.1 F (37.3 C) (12/04 0400) Pulse Rate:  [84-118] 118 (12/04 0359) Resp:  [22-30] 30 (12/04 0600) BP: (80-143)/(61-88) 109/79 mmHg (12/04 0700) SpO2:  [90 %-97 %] 91 % (12/04 0600) Arterial Line BP: (63-131)/(50-100) 93/75 mmHg (12/04 0600) FiO2 (%):  [80 %-100 %] 100 % (12/04 0400) Weight:  [150.3 kg (331 lb 5.6 oz)] 150.3 kg (331 lb 5.6 oz) (12/04 0500) Last BM Date: 05/27/15  Intake/Output from previous day: 12/03 0701 - 12/04 0700 In: 17263.8 [I.V.:15303.8; NG/GT:100; IV Piggyback:1860] Out: 1058 [Urine:1058] Intake/Output this shift:   Eyes open - responds to commands  Abd - indicates some RLQ tenderness; no palpable masses; soft  Lab Results:   Recent Labs  05/29/15 0445 05/29/15 0502 05/30/15 0315  WBC 21.4*  --  18.8*  HGB 13.5 15.0 12.9*  HCT 42.2 44.0 38.6*  PLT 189  --  220   BMET  Recent Labs  05/29/15 1618 05/30/15 0420  NA 129* 129*  K 4.1 3.5  CL 96* 93*  CO2 23 26  GLUCOSE 207* 162*  BUN 20 25*  CREATININE 1.94* 1.80*  CALCIUM 6.6* 6.5*   PT/INR No results for input(s): LABPROT, INR in the last 72 hours. ABG  Recent Labs  05/29/15 0331 05/29/15 0700  PHART 7.214* 7.333*  HCO3 17.5* 19.4*    Studies/Results: Dg Chest Portable 1 View  05/29/2015  CLINICAL DATA:  59 year old male with central line placement EXAM: PORTABLE CHEST 1 VIEW COMPARISON:  Chest radiograph dated 12/02/2014 FINDINGS: An endotracheal tube is noted with tip approximately 7 cm above the carina. An enteric tube is seen coursing over the mediastinum which she beyond the image cut off. A left IJ central line noted with tip over central SVC. Single-view of the chest demonstrate clear lungs. There is no  pneumothorax. There has been interval widening of the mediastinal silhouette compared to the prior study. A traumatic vascular injury with hemorrhage into the mediastinum is not excluded. CT is recommended for further evaluation. The osseous structures are grossly unremarkable. IMPRESSION: Left IJ central line with tip over central SVC.  No pneumothorax. Interval widening of the mediastinum concerning for traumatic vascular injury. CT is recommended for further evaluation. Critical Value/emergent results were called by telephone at the time of interpretation on 05/29/2015 at 2:22 am to nurse [facility], who verbally acknowledged these results. Electronically Signed   By: Elgie Collard M.D.   On: 05/29/2015 02:23   Dg Abd Portable 1v  05/29/2015  CLINICAL DATA:  Orogastric tube placement EXAM: PORTABLE ABDOMEN - 1 VIEW COMPARISON:  None. FINDINGS: The orogastric tube extends into the stomach with tip in the region of the mid gastric body. Right hydronephrosis is present, with retention of contrast in the dilated right renal collecting system and proximal ureter, presumably due to the proximal ureteral calculus. IMPRESSION: Orogastric tube extends into the stomach. Right hydronephrosis with delayed excretion of contrast. Electronically Signed   By: Ellery Plunk M.D.   On: 05/29/2015 05:47    Anti-infectives: Anti-infectives    Start     Dose/Rate Route Frequency Ordered Stop   05/30/15 0600  vancomycin (VANCOCIN) 1,250 mg in sodium chloride 0.9 % 250 mL IVPB     1,250 mg 166.7 mL/hr over 90 Minutes Intravenous Every  24 hours 05/29/15 0237     05/29/15 1200  meropenem (MERREM) 1 g in sodium chloride 0.9 % 100 mL IVPB     1 g 200 mL/hr over 30 Minutes Intravenous Every 8 hours 05/29/15 0237     05/29/15 0600  piperacillin-tazobactam (ZOSYN) IVPB 3.375 g  Status:  Discontinued     3.375 g 12.5 mL/hr over 240 Minutes Intravenous 3 times per day 06/03/2015 2256 05/29/15 0216   05/29/15 0245   vancomycin (VANCOCIN) 2,500 mg in sodium chloride 0.9 % 500 mL IVPB     2,500 mg 250 mL/hr over 120 Minutes Intravenous  Once 05/29/15 0237 05/29/15 0612   05/29/15 0245  meropenem (MERREM) 1 g in sodium chloride 0.9 % 100 mL IVPB     1 g 200 mL/hr over 30 Minutes Intravenous  Once 05/29/15 0237 05/29/15 0339   06/13/2015 2315  piperacillin-tazobactam (ZOSYN) IVPB 3.375 g     3.375 g 100 mL/hr over 30 Minutes Intravenous  Once 06/26/2015 2300 05/29/15 0124   06/08/2015 0830  cefTRIAXone (ROCEPHIN) 2 g in dextrose 5 % 50 mL IVPB  Status:  Discontinued     2 g 100 mL/hr over 30 Minutes Intravenous Every 24 hours 05/29/2015 0815 05/27/2015 2256   05/27/2015 0830  metroNIDAZOLE (FLAGYL) IVPB 500 mg  Status:  Discontinued     500 mg 100 mL/hr over 60 Minutes Intravenous Every 8 hours 05/27/2015 0815 06/04/2015 2256   06/22/2015 0715  piperacillin-tazobactam (ZOSYN) IVPB 3.375 g     3.375 g 12.5 mL/hr over 240 Minutes Intravenous  Once 06/21/2015 0700 06/22/2015 0907      Assessment/Plan: s/p Procedure(s): Left Heart Cath (N/A) IABP Insertion (N/A) Temporary Pacemaker (N/A) Acute appendicitis - cardiac arrest during attempted surgery  Currently not an operative candidate - poor prognosis Continue Vancomycin/ Meropenem for now. - slight clinical improvement  LOS: 2 days    Stephanny Tsutsui K. 05/30/2015

## 2015-05-30 NOTE — Progress Notes (Signed)
  Patient back in NSR. VT calming down.  Making urine. CVP 14-15. Oxygenation improving.   Will continue to hold the course.   Recheck co-ox now.   Bensimhon, Daniel,MD 8:39 PM

## 2015-05-31 ENCOUNTER — Inpatient Hospital Stay (HOSPITAL_COMMUNITY): Payer: Medicaid Other

## 2015-05-31 ENCOUNTER — Encounter (HOSPITAL_COMMUNITY): Payer: Self-pay | Admitting: Cardiovascular Disease

## 2015-05-31 DIAGNOSIS — I213 ST elevation (STEMI) myocardial infarction of unspecified site: Secondary | ICD-10-CM

## 2015-05-31 DIAGNOSIS — J9600 Acute respiratory failure, unspecified whether with hypoxia or hypercapnia: Secondary | ICD-10-CM

## 2015-05-31 DIAGNOSIS — I480 Paroxysmal atrial fibrillation: Secondary | ICD-10-CM

## 2015-05-31 LAB — BASIC METABOLIC PANEL
ANION GAP: 11 (ref 5–15)
Anion gap: 11 (ref 5–15)
Anion gap: 7 (ref 5–15)
BUN: 34 mg/dL — AB (ref 6–20)
BUN: 35 mg/dL — ABNORMAL HIGH (ref 6–20)
BUN: 34 mg/dL — ABNORMAL HIGH (ref 6–20)
CALCIUM: 6.1 mg/dL — AB (ref 8.9–10.3)
CALCIUM: 6.3 mg/dL — AB (ref 8.9–10.3)
CALCIUM: 6.1 mg/dL — AB (ref 8.9–10.3)
CHLORIDE: 85 mmol/L — AB (ref 101–111)
CHLORIDE: 83 mmol/L — AB (ref 101–111)
CO2: 29 mmol/L (ref 22–32)
CO2: 31 mmol/L (ref 22–32)
CO2: 34 mmol/L — AB (ref 22–32)
CREATININE: 1.82 mg/dL — AB (ref 0.61–1.24)
CREATININE: 1.81 mg/dL — AB (ref 0.61–1.24)
Chloride: 87 mmol/L — ABNORMAL LOW (ref 101–111)
Creatinine, Ser: 1.83 mg/dL — ABNORMAL HIGH (ref 0.61–1.24)
GFR calc Af Amer: 45 mL/min — ABNORMAL LOW (ref >60)
GFR calc Af Amer: 45 mL/min — ABNORMAL LOW (ref >60)
GFR calc non Af Amer: 39 mL/min — ABNORMAL LOW (ref >60)
GFR calc non Af Amer: 39 mL/min — ABNORMAL LOW (ref >60)
GFR, EST AFRICAN AMERICAN: 45 mL/min — AB (ref >60)
GFR, EST NON AFRICAN AMERICAN: 39 mL/min — AB (ref >60)
GLUCOSE: 179 mg/dL — AB (ref 65–99)
GLUCOSE: 186 mg/dL — AB (ref 65–99)
GLUCOSE: 171 mg/dL — AB (ref 65–99)
Potassium: 2.9 mmol/L — ABNORMAL LOW (ref 3.5–5.1)
Potassium: 4.4 mmol/L (ref 3.5–5.1)
Potassium: 2.8 mmol/L — ABNORMAL LOW (ref 3.5–5.1)
Sodium: 127 mmol/L — ABNORMAL LOW (ref 135–145)
Sodium: 127 mmol/L — ABNORMAL LOW (ref 135–145)
Sodium: 124 mmol/L — ABNORMAL LOW (ref 135–145)

## 2015-05-31 LAB — CARBOXYHEMOGLOBIN
CARBOXYHEMOGLOBIN: 0.9 % (ref 0.5–1.5)
Carboxyhemoglobin: 1 % (ref 0.5–1.5)
METHEMOGLOBIN: 1.2 % (ref 0.0–1.5)
Methemoglobin: 1.2 % (ref 0.0–1.5)
O2 SAT: 80.3 %
O2 SAT: 67.6 %
Total hemoglobin: 11.9 g/dL — ABNORMAL LOW (ref 13.5–18.0)
Total hemoglobin: 12.4 g/dL — ABNORMAL LOW (ref 13.5–18.0)

## 2015-05-31 LAB — CBC
HEMATOCRIT: 34.5 % — AB (ref 39.0–52.0)
Hemoglobin: 12.1 g/dL — ABNORMAL LOW (ref 13.0–17.0)
MCH: 29.4 pg (ref 26.0–34.0)
MCHC: 35.1 g/dL (ref 30.0–36.0)
MCV: 83.9 fL (ref 78.0–100.0)
PLATELETS: 195 10*3/uL (ref 150–400)
RBC: 4.11 MIL/uL — ABNORMAL LOW (ref 4.22–5.81)
RDW: 13.2 % (ref 11.5–15.5)
WBC: 17.9 10*3/uL — ABNORMAL HIGH (ref 4.0–10.5)

## 2015-05-31 LAB — BLOOD GAS, ARTERIAL
Acid-Base Excess: 10.2 mmol/L — ABNORMAL HIGH (ref 0.0–2.0)
Bicarbonate: 33 mEq/L — ABNORMAL HIGH (ref 20.0–24.0)
Drawn by: 224201
FIO2: 0.8
MECHVT: 590 mL
O2 Saturation: 91.1 %
PATIENT TEMPERATURE: 98.6
PCO2 ART: 33.7 mmHg — AB (ref 35.0–45.0)
PEEP: 12 cmH2O
PO2 ART: 57.7 mmHg — AB (ref 80.0–100.0)
RATE: 30 resp/min
TCO2: 34 mmol/L (ref 0–100)
pH, Arterial: 7.596 — ABNORMAL HIGH (ref 7.350–7.450)

## 2015-05-31 LAB — POCT I-STAT 3, ART BLOOD GAS (G3+)
ACID-BASE EXCESS: 11 mmol/L — AB (ref 0.0–2.0)
BICARBONATE: 32.6 meq/L — AB (ref 20.0–24.0)
O2 SAT: 95 %
PO2 ART: 60 mmHg — AB (ref 80.0–100.0)
TCO2: 34 mmol/L (ref 0–100)
pCO2 arterial: 31 mmHg — ABNORMAL LOW (ref 35.0–45.0)
pH, Arterial: 7.629 (ref 7.350–7.450)

## 2015-05-31 LAB — GLUCOSE, CAPILLARY
GLUCOSE-CAPILLARY: 151 mg/dL — AB (ref 65–99)
GLUCOSE-CAPILLARY: 155 mg/dL — AB (ref 65–99)
GLUCOSE-CAPILLARY: 171 mg/dL — AB (ref 65–99)
Glucose-Capillary: 172 mg/dL — ABNORMAL HIGH (ref 65–99)
Glucose-Capillary: 158 mg/dL — ABNORMAL HIGH (ref 65–99)
Glucose-Capillary: 155 mg/dL — ABNORMAL HIGH (ref 65–99)

## 2015-05-31 LAB — MAGNESIUM
Magnesium: 1.8 mg/dL (ref 1.7–2.4)
Magnesium: 1.8 mg/dL (ref 1.7–2.4)

## 2015-05-31 LAB — CALCIUM, IONIZED
CALCIUM, IONIZED, SERUM: 4.2 mg/dL — AB (ref 4.5–5.6)
Calcium, Ionized, Serum: 4.6 mg/dL (ref 4.5–5.6)

## 2015-05-31 LAB — HEPARIN LEVEL (UNFRACTIONATED): Heparin Unfractionated: 0.33 IU/mL (ref 0.30–0.70)

## 2015-05-31 MED ORDER — MAGNESIUM SULFATE 2 GM/50ML IV SOLN
2.0000 g | Freq: Once | INTRAVENOUS | Status: AC
  Administered 2015-05-31: 2 g via INTRAVENOUS
  Filled 2015-05-31: qty 50

## 2015-05-31 MED ORDER — ROCURONIUM BROMIDE 100 MG/10ML IV SOLN
INTRAVENOUS | Status: DC | PRN
  Administered 2015-05-28: 50 mg via INTRAVENOUS

## 2015-05-31 MED ORDER — SODIUM CHLORIDE 0.9 % IV SOLN
INTRAVENOUS | Status: DC
  Administered 2015-05-31 – 2015-06-01 (×2): 10 mL/h via INTRAVENOUS
  Administered 2015-06-02: 20:00:00 via INTRAVENOUS
  Administered 2015-06-02: 10 mL/h via INTRAVENOUS

## 2015-05-31 MED ORDER — DOPAMINE-DEXTROSE 3.2-5 MG/ML-% IV SOLN
0.0000 ug/kg/min | INTRAVENOUS | Status: DC
  Administered 2015-05-31: 5 ug/kg/min via INTRAVENOUS
  Administered 2015-06-02: 2.5 ug/kg/min via INTRAVENOUS
  Filled 2015-05-31 (×3): qty 250

## 2015-05-31 MED ORDER — POTASSIUM CHLORIDE 10 MEQ/50ML IV SOLN
10.0000 meq | INTRAVENOUS | Status: AC
  Administered 2015-05-31 (×6): 10 meq via INTRAVENOUS
  Filled 2015-05-31 (×6): qty 50

## 2015-05-31 MED ORDER — SODIUM CHLORIDE 0.9 % IV SOLN
1.0000 g | Freq: Once | INTRAVENOUS | Status: AC
  Administered 2015-05-31: 1 g via INTRAVENOUS
  Filled 2015-05-31: qty 10

## 2015-05-31 NOTE — Progress Notes (Signed)
Dr Gala Romney notified of Calcium 6.3; orders received. Dr Isaiah Serge notified of ABG results. No new orders at current time.

## 2015-05-31 NOTE — Progress Notes (Signed)
eLink Physician-Brief Progress Note Patient Name: Scott Holden DOB: 1956/01/10 MRN: [id number]   Date of Service  05/31/2015  HPI/Events of Note  Hypokalemia severe Hypomagnesemia Hypocalcemia Mild hypoalbuminemia AKI Labs appear accurate when compared to those drawn earlier today  eICU Interventions  Replete electrolytes, attenuate K dose with AKI Repeat BMET later today     Intervention Category Minor Interventions: Electrolytes abnormality - evaluation and management  Max Fickle 05/31/2015, 2:42 AM

## 2015-05-31 NOTE — Progress Notes (Signed)
Pt had couple episodes of hypotension, levo maxed out, dopamine increased, pt back in a fib, heart rate was 140s now 100-140, CCM and Cards notified. Orders to continue current regiment, will continue to monitor closely.

## 2015-05-31 NOTE — Progress Notes (Signed)
CRITICAL VALUE ALERT  Critical value received:  Calcium 6.1  Date of notification:  05/31/15  Time of notification:  0238  Critical value read back:Yes.    Nurse who received alert:  Lexi  MD notified (1st page):  Dr. Kendrick Fries   Time of first page:  0238  MD notified (2nd page):  Time of second page:  Responding MD:  Dr. Kendrick Fries   Time MD responded:  [phone]

## 2015-05-31 NOTE — Progress Notes (Signed)
PULMONARY / CRITICAL CARE MEDICINE   Name: Scott Holden MRN: [id number] DOB: 1956-06-15    ADMISSION DATE:  June 19, 2015 CONSULTATION DATE:  06/19/2015   REFERRING MD :  Dr. Clifton James  CHIEF COMPLAINT:  Cardiogenic and septic shock  INITIAL PRESENTATION:  61M presented to [facility] with several days of progressively worsening RLQ pain found to have an acute appendicitis.  He went to the OR and suffered a VT arrest on the table after the insertion of the trochar.  He was transferred to [facility] after a code STEMI was called and the surgical intervention was aborted.  In the cath lab an RCA DES was placed and an IABP inserted for MI and cardiogenic shock.  While in cath lab, he required shock x6 for VF arrest and post return to ICU, shock x5.     SUBJECTIVE: Torsades noted yesterday. In and out of A. Fib. Continues to be on multiple pressors, lasix and inotropes. He remains intubated, sedated  VITAL SIGNS: Temp:  [97.3 F (36.3 C)-98.4 F (36.9 C)] 97.9 F (36.6 C) (12/05 1000) Pulse Rate:  [77-92] 77 (12/05 0351) Resp:  [0-30] 30 (12/05 0800) BP: (92-141)/(60-88) 141/88 mmHg (12/05 0800) SpO2:  [83 %-95 %] 90 % (12/05 1000) Arterial Line BP: (74-137)/(54-108) 119/76 mmHg (12/05 1000) FiO2 (%):  [10 %-100 %] 80 % (12/05 0828) Weight:  [343 lb 0.6 oz (155.6 kg)] 343 lb 0.6 oz (155.6 kg) (12/05 0314)   HEMODYNAMICS: PAP: (31-39)/(23-32) 38/30 mmHg CVP:  [14 mmHg-48 mmHg] 15 mmHg PCWP:  [18 mmHg-20 mmHg] 18 mmHg CO:  [5.2 L/min-6 L/min] 6 L/min CI:  [2 L/min/m2-2.3 L/min/m2] 2.3 L/min/m2   VENTILATOR SETTINGS: Vent Mode:  [-] PRVC FiO2 (%):  [10 %-100 %] 80 % Set Rate:  [30 bmp] 30 bmp Vt Set:  [590 mL] 590 mL PEEP:  [10 cmH20-12 cmH20] 12 cmH20 Plateau Pressure:  [25 cmH20-32 cmH20] 26 cmH20   INTAKE / OUTPUT:  Intake/Output Summary (Last 24 hours) at 05/31/15 1107 Last data filed at 05/31/15 1013  Gross per 24 hour  Intake 10121.7 ml  Output   3850 ml  Net 6271.7 ml    PHYSICAL  EXAMINATION: General:  Obese male, critically ill Neuro:  Sedated, unresponsive HEENT:  ETT tube in place, moist mucous membranes Cardiovascular:  Regular rate and rhythm  Lungs:  Bilateral rhonchi, nonlabored Abdomen:  Soft, tender Musculoskeletal:   Normal tone, no acute deformities  Skin:  Generalized edema  LABS:  CBC  Recent Labs Lab 05/29/15 0445 05/29/15 0502 05/30/15 0315 05/31/15 0145  WBC 21.4*  --  18.8* 17.9*  HGB 13.5 15.0 12.9* 12.1*  HCT 42.2 44.0 38.6* 34.5*  PLT 189  --  220 195   Coag's No results for input(s): APTT, INR in the last 168 hours.   BMET  Recent Labs Lab 05/30/15 1715 05/31/15 0145 05/31/15 0900  NA 129* 127* 127*  K 3.0* 2.9* 4.4  CL 91* 87* 85*  CO2 BUN 31* 34* 35*  CREATININE 1.86* 1.83* 1.82*  GLUCOSE 192* 179* 186*   Electrolytes  Recent Labs Lab 05/29/15 1618  05/30/15 0534 05/30/15 1715 05/31/15 0145 05/31/15 0900  CALCIUM 6.6*  < >  --  6.3* 6.1* 6.3*  MG 1.8  --  1.8  --  1.8  --   < > = values in this interval not displayed. Sepsis Markers  Recent Labs Lab 05/29/15 0314 05/29/15 0457 05/29/15 1456  LATICACIDVEN 6.95* 6.9* 3.9*  ABG  Recent Labs Lab 05/29/15 0331 05/29/15 0700 05/30/15 1523  PHART 7.214* 7.333* 7.474*  PCO2ART 43.4 37.7 36.5  PO2ART 85.0 72.6* 55.0*   Liver Enzymes  Recent Labs Lab 06/14/2015 0445 06/23/2015 1852  AST 20 25  ALT 17 16*  ALKPHOS 86 60  BILITOT 0.7 1.3*  ALBUMIN 4.1 3.0*   Cardiac Enzymes  Recent Labs Lab 06/03/2015 2309 05/29/15 0445 05/29/15 1000  TROPONINI >65.00* >65.00* >65.00*   Glucose  Recent Labs Lab 05/30/15 1335 05/30/15 1716 05/30/15 2000 05/30/15 2217 05/30/15 2335 05/31/15 0400  GLUCAP 178* 179* 104* 171* 184* 172*    Imaging Dg Chest Port 1 View  05/31/2015  CLINICAL DATA:  Acute respiratory failure EXAM: PORTABLE CHEST 1 VIEW COMPARISON:  Yesterday FINDINGS: Endotracheal tube tip in good position at the clavicular  heads. The aortic balloon pump tip is just below the level of the aortic arch. Swan-Ganz catheter, tip directed towards the inner lobar pulmonary artery on the right. Left IJ central line, tip directed towards the SVC origin. Unchanged hazy appearance of the chest compatible the atelectasis and pleural fluid. Pulmonary venous congestion with cephalized blood flow. No pneumothorax. Stable normal heart size. IMPRESSION: 1. Stable positioning of tubes and lines. 2. Unchanged edema, effusions, and atelectasis. Electronically Signed   By: Marnee Spring M.D.   On: 05/31/2015 07:17   Dg Chest Port 1 View  05/30/2015  CLINICAL DATA:  Central line placed EXAM: PORTABLE CHEST 1 VIEW COMPARISON:  Chest radiograph from earlier today. FINDINGS: Endotracheal tube tip is 5.3 cm above the carina. Left internal jugular central venous catheter terminates over the left brachiocephalic vein. Enteric tube enters the lower thoracic esophagus, with the tip below the inferior margin of this image. Right internal jugular Swan-Ganz catheter terminates in the bronchus intermedius branch of the right pulmonary artery. Intra-aortic balloon pump overlies the proximal descending thoracic aorta at the T5 level. Stable cardiomediastinal silhouette with mild cardiomegaly. No pneumothorax. Worsening small bilateral pleural effusions. Mild to moderate pulmonary edema, worsened. Bibasilar atelectasis, worsened. IMPRESSION: 1. Right internal jugular Swan-Ganz catheter terminates in the bronchus intermedius branch of the right pulmonary artery, recommend 5 cm retraction. 2. No pneumothorax. 3. Mild-to-moderate congestive heart failure, worsened. 4. Small bilateral pleural effusions, increased bilaterally. 5. Worsening bibasilar atelectasis. Electronically Signed   By: Delbert Phenix M.D.   On: 05/30/2015 13:16     ASSESSMENT / PLAN: 59 y/o male with multiorgan dysfunction syndrome due to mixed cardiogenic shock from intra-operative MI complicated  by septic shock from appendicitis without source control.  His prognosis is overall very poor as he is on supra-maximal doses multiple vaso-pressor agents, IABP and maximum ventilator support for has combined metabolic and respiratory acidosis.  PULMONARY A:  Hypoxemic and hypercapnic respiratory failure - despite max vent support. P:   PRVC 8 cc/kg Vt Wean PEEP / FiO2 for sats > 92% Goal pH >7.2 to allow pressors to function Trend CXR  Intermittent ABG  CARDIOVASCULAR L FEM IABP 12/2 >>  L IJ TLC 12/2 >>  A:  VF Arrest - intraoperatively, s/p LHC with DES to RCA Mixed cardiogenic shock - in the setting of inferior intra-operative MI and septic shock.  IABP 1:1.  ScVO2 68 Torsades / Monomorphic VT, Frequent PVC's - suspect in setting of ischemia  Afib RV Infarct  Swan ganz cath P:  Continue heparin gtt and dual anti-platelet therapy. IABP 1:1 Continue Amiodarone, Lido Milrinone for ionotope Continue dopamine, levophed and vasopressin  ASA  Stress  dose steroids  Cardiology Following    RENAL A:   AGMA/NAGMA: 2/2 oliguric renal failure and lactic acidemia. Hyponatremia  Acute Kidney Injury - in setting of arrest / hypoperfusion  P:   Vent support as above Bicarb gtt @ 200 ml/hr to maintain pH >7.2 Lasix drip for diuresis Monitor UOP / BMP  Replace electrolytes as indicated  Trend lactic acid  NS @ 150 ml/hr Lasix drip   GASTROINTESTINAL A:   Appendicitis - unable to complete surgery for source control.  No evidence of peritonitis or rupture. P:   NPO  PPI  No plan for repeat OR per surgery given his critical condition Concern for poor prognosis   HEMATOLOGIC A:   Dual Antiplatelet therapy and heparin gtt. P:  Aggrenox / Plavix Heparin gtt per pharmacy   INFECTIOUS A:   Septic shock - secondary to appendicitis without source control. P:   BCx2 12/2 >>  ABX: Vanc 12/2 >>  Meropenem 12/3 >>  Monitor fever curve / WBC Consider anti-fungal if  further decompensation   ENDOCRINE A:   Relative adrenal insufficiency.   P:   Hydrocortisone  IV Q8hours SSI to resistant scale, CBG Q4  NEUROLOGIC A:   ICU Associated Pain  P:   RASS goal: -2 with vent compliance Fentanyl gtt for pain Versed for sedation   FAMILY  - Updates:  No family at bedside.  Continue all current measures.  NO CPR in the event of arrest.  Shock / drugs ok.   Critical care time-35 minutes.  Chilton Greathouse MD [facility] Pager [phone] If no answer or after 3pm call: [phone] 05/31/2015, 11:08 AM

## 2015-05-31 NOTE — Addendum Note (Signed)
Addendum  created 05/31/15 1347 by Illene Silver, CRNA   Modules edited: Anesthesia Events, Anesthesia Flowsheet, Anesthesia Medication Administration, Narrator, Narrator Events   Narrator:  Narrator: Event Log Edited   Narrator Events:  Delete Quick Note event; Delete Quick Note event; Delete Quick Note event

## 2015-05-31 NOTE — Progress Notes (Signed)
eLink Physician-Brief Progress Note Patient Name: Scott Holden DOB: 1956/04/26 MRN: [id number]   Date of Service  05/31/2015  HPI/Events of Note  Respiratory alkalosis  eICU Interventions  Adjust vent     Intervention Category Major Interventions: Acid-Base disturbance - evaluation and management  YACOUB,WESAM 05/31/2015, 4:37 PM

## 2015-05-31 NOTE — Progress Notes (Signed)
Advanced Heart Failure Rounding Note   Subjective:    Remains intubated/sedated. 11 liters positive yesterday. Now with good urine output on lasix gtt.   Remains on dopamine, levophed, milrinone and vasopressin. + IABP @ 1:2  Co-ox 68%   Creatinine stable. Oxygenation improved. Supping electrolytes. CXR with edema/ARDS. IABP and swan ok. On stress dose steroids.   Swan numbers done personally at bedside  RA 15 PA 35/26 PCWP 18 Thermo 7.0/2.7 SVR 931  Objective:   Weight Range:  Vital Signs:   Temp:  [97.3 F (36.3 C)-98.4 F (36.9 C)] 97.7 F (36.5 C) (12/05 0800) Pulse Rate:  [77-92] 77 (12/05 0351) Resp:  [0-30] 30 (12/05 0800) BP: (90-141)/(60-88) 141/88 mmHg (12/05 0800) SpO2:  [83 %-95 %] 93 % (12/05 0800) Arterial Line BP: (74-137)/(54-108) 137/87 mmHg (12/05 0800) FiO2 (%):  [10 %-100 %] 80 % (12/05 0828) Weight:  [155.6 kg (343 lb 0.6 oz)] 155.6 kg (343 lb 0.6 oz) (12/05 0314) Last BM Date: 05/27/15  Weight change: Filed Weights   05/30/15 0500 05/30/15 2200 05/31/15 0314  Weight: 150.3 kg (331 lb 5.6 oz) 155.6 kg (343 lb 0.6 oz) 155.6 kg (343 lb 0.6 oz)    Intake/Output:   Intake/Output Summary (Last 24 hours) at 05/31/15 0855 Last data filed at 05/31/15 0817  Gross per 24 hour  Intake 11266.48 ml  Output   3380 ml  Net 7886.48 ml     Physical Exam: General:  Intubated sedated. HEENT: normal x for ETT Neck: supple. RIJ swan Carotids 2+ bilat;  No lymphadenopathy or thryomegaly appreciated. Cor: PMI nonpalpable. Regular rate & rhythm. No rubs, gallops or murmurs. Lungs: clear anteriorly Abdomen: Markedly obese. Soft. Hypoactive BS Extremities: left femoral IABP no cyanosis, clubbing, rash, 1+ edema Neuro: sedated no focal  Telemetry: NSR   Labs: Basic Metabolic Panel:  Recent Labs Lab 05/31/2015 2307  05/29/15 0445 05/29/15 0502 05/29/15 1618 05/30/15 0420 05/30/15 0534 05/30/15 1715 05/31/15 0145  NA  --   < > 132* 133* 129*  129*  --  129* 127*  K  --   < > 3.3* 3.3* 4.1 3.5  --  3.0* 2.9*  CL  --   < > 98* 94* 96* 93*  --  91* 87*  CO2  --   < > 19*  --  23 26  --  28 29  GLUCOSE  --   < > 311* 312* 207* 162*  --  192* 179*  BUN  --   < > 18 20 20  25*  --  31* 34*  CREATININE  --   < > 2.24* 1.90* 1.94* 1.80*  --  1.86* 1.83*  CALCIUM  --   < > 7.8*  --  6.6* 6.5*  --  6.3* 6.1*  MG 1.4*  --  1.7  --  1.8  --  1.8  --  1.8  < > = values in this interval not displayed.  Liver Function Tests:  Recent Labs Lab 05/27/2015 0445 06/22/2015 1852  AST 20 25  ALT 17 16*  ALKPHOS 86 60  BILITOT 0.7 1.3*  PROT 7.1 5.5*  ALBUMIN 4.1 3.0*    Recent Labs Lab 06/20/2015 0445  LIPASE 21   No results for input(s): AMMONIA in the last 168 hours.  CBC:  Recent Labs Lab 06/17/2015 0445 06/02/2015 1852 05/29/15 0445 05/29/15 0502 05/30/15 0315 05/31/15 0145  WBC 19.5* 17.1* 21.4*  --  18.8* 17.9*  NEUTROABS  --  15.4*  --   --   --   --   HGB 14.9 13.4 13.5 15.0 12.9* 12.1*  HCT 44.2 40.8 42.2 44.0 38.6* 34.5*  MCV 88.6 91.7 91.5  --  88.7 83.9  PLT 239 209 189  --  220 195    Cardiac Enzymes:  Recent Labs Lab May 29, 2015 1852 May 29, 2015 1853 05-29-15 2309 05/29/15 0445 05/29/15 1000  CKTOTAL  --  49  --   --   --   CKMB  --  8.9*  --   --   --   TROPONINI 0.65*  --  >65.00* >65.00* >65.00*    BNP: BNP (last 3 results) No results for input(s): BNP in the last 8760 hours.  ProBNP (last 3 results) No results for input(s): PROBNP in the last 8760 hours.    Other results:  Imaging: Dg Chest Port 1 View  05/31/2015  CLINICAL DATA:  Acute respiratory failure EXAM: PORTABLE CHEST 1 VIEW COMPARISON:  Yesterday FINDINGS: Endotracheal tube tip in good position at the clavicular heads. The aortic balloon pump tip is just below the level of the aortic arch. Swan-Ganz catheter, tip directed towards the inner lobar pulmonary artery on the right. Left IJ central line, tip directed towards the SVC origin.  Unchanged hazy appearance of the chest compatible the atelectasis and pleural fluid. Pulmonary venous congestion with cephalized blood flow. No pneumothorax. Stable normal heart size. IMPRESSION: 1. Stable positioning of tubes and lines. 2. Unchanged edema, effusions, and atelectasis. Electronically Signed   By: Marnee Spring M.D.   On: 05/31/2015 07:17   Dg Chest Port 1 View  05/30/2015  CLINICAL DATA:  Central line placed EXAM: PORTABLE CHEST 1 VIEW COMPARISON:  Chest radiograph from earlier today. FINDINGS: Endotracheal tube tip is 5.3 cm above the carina. Left internal jugular central venous catheter terminates over the left brachiocephalic vein. Enteric tube enters the lower thoracic esophagus, with the tip below the inferior margin of this image. Right internal jugular Swan-Ganz catheter terminates in the bronchus intermedius branch of the right pulmonary artery. Intra-aortic balloon pump overlies the proximal descending thoracic aorta at the T5 level. Stable cardiomediastinal silhouette with mild cardiomegaly. No pneumothorax. Worsening small bilateral pleural effusions. Mild to moderate pulmonary edema, worsened. Bibasilar atelectasis, worsened. IMPRESSION: 1. Right internal jugular Swan-Ganz catheter terminates in the bronchus intermedius branch of the right pulmonary artery, recommend 5 cm retraction. 2. No pneumothorax. 3. Mild-to-moderate congestive heart failure, worsened. 4. Small bilateral pleural effusions, increased bilaterally. 5. Worsening bibasilar atelectasis. Electronically Signed   By: Delbert Phenix M.D.   On: 05/30/2015 13:16   Dg Chest Port 1 View  05/30/2015  CLINICAL DATA:  Acute respiratory failure. History of sarcoidosis and diabetes. EXAM: PORTABLE CHEST 1 VIEW COMPARISON:  05/29/2015 and 12/02/2014. FINDINGS: 1037 hours. The endotracheal tube, nasogastric tube and left IJ central venous catheter appear unchanged. External pacers overlie the left chest. The heart size and  mediastinal contours are stable from yesterday. There are lower lung volumes with increased vascular congestion and increased patchy opacities at both lung bases. There may be small bilateral pleural effusions. No consolidation or pneumothorax identified. IMPRESSION: Worsening pulmonary aeration with increased bibasilar airspace opacities and possible pleural effusions. Widening of the superior mediastinum is unchanged from yesterday, likely at least partly due to lower lung volumes and AP technique. Electronically Signed   By: Carey Bullocks M.D.   On: 05/30/2015 11:33      Medications:     Scheduled Medications: . antiseptic  oral rinse  7 mL Mouth Rinse 10 times per day  . aspirin  81 mg Per Tube Daily  . atorvastatin  80 mg Per NG tube q1800  . chlorhexidine gluconate  15 mL Mouth Rinse BID  . Chlorhexidine Gluconate Cloth  6 each Topical Q0600  . Chlorhexidine Gluconate Cloth  6 each Topical Q0600  . clopidogrel  75 mg Per NG tube Daily  . fentaNYL (SUBLIMAZE) injection  50 mcg Intravenous Once  . hydrocortisone sod succinate (SOLU-CORTEF) inj  100 mg Intravenous Q8H  . Influenza vac split quadrivalent PF  0.5 mL Intramuscular Tomorrow-1000  . insulin aspart  0-20 Units Subcutaneous 6 times per day  . meropenem (MERREM) IV  1 g Intravenous Q8H  . mupirocin ointment  1 application Nasal BID  . pantoprazole (PROTONIX) IV  40 mg Intravenous Q24H  . potassium chloride  10 mEq Intravenous Q1 Hr x 6  . sodium chloride  10-40 mL Intracatheter Q12H  . sodium chloride  3 mL Intravenous Q12H  . vancomycin  1,250 mg Intravenous Q24H     Infusions: . sodium chloride Stopped (05/30/15 1130)  . amiodarone 60 mg/hr (05/31/15 0837)  . DOPamine 9 mcg/kg/min (05/31/15 0817)  . fentaNYL infusion INTRAVENOUS 200 mcg/hr (05/31/15 0817)  . furosemide (LASIX) infusion 15 mg/hr (05/31/15 0817)  . heparin 2,500 Units/hr (05/31/15 0817)  . lidocaine 2 mg/min (05/31/15 0817)  . midazolam (VERSED)  infusion 4 mg/hr (05/31/15 0817)  . milrinone 0.375 mcg/kg/min (05/31/15 0817)  . norepinephrine (LEVOPHED) Adult infusion 35 mcg/min (05/31/15 0817)  . phenylephrine (NEO-SYNEPHRINE) Adult infusion Stopped (05/30/15 0324)  .  sodium bicarbonate 150 mEq in sterile water 1000 mL infusion 200 mL/hr at 05/31/15 0759  . vasopressin (PITRESSIN) infusion - *FOR SHOCK* 0.03 Units/min (05/31/15 0817)     PRN Medications:  sodium chloride, fentaNYL, midazolam, midazolam, morphine injection, ondansetron **OR** ondansetron (ZOFRAN) IV, simethicone, sodium chloride, sodium chloride   Assessment:   1. Acute systolic HF with cardiogenic shock with biventricular dysfunction R>L 2. Acute inferior STEMI with RV involvement   --s/p PCI RCA on 12/2 3. Acute respiratory failure 4. AKI 5. Acute appendicitis being treated medically 6. Recurrent VT/VF 7. New onset AF 8. Hypomagnesemia 9. Hypocalcemia 10. Hypokalemia  Plan/Discussion:    Much more stable overnight. No VT/VF X 12+ hours. Swan numbers much improved on multiple pressors. Will continue IABP at 1:2. Begin to wean dopamine. Continue amio/lido today. If VT quiescent can begin lido wean later today or tomorrow. On down to 2 pressors will consider removing IABP. Continue broad spectrum abx. Vent management per CCM.   He remains critically ill but certainly improved over past 24 hours. Long talk with family at bedside. Will continue aggressive care at this point.   The patient is critically ill with multiple organ systems failure and requires high complexity decision making for assessment and support, frequent evaluation and titration of therapies, application of advanced monitoring technologies and extensive interpretation of multiple databases.   Critical Care Time devoted to patient care services described in this note is 45 Minutes.    Length of Stay: 3  Bensimhon, Daniel MD 05/31/2015, 8:55 AM  Advanced Heart Failure Team Pager  [phone] (M-F; 7a - 4p)  Please contact [facility] for night-coverage after hours (4p -7a ) and weekends on [email or web address]

## 2015-05-31 NOTE — Consult Note (Signed)
ANTICOAGULATION CONSULT NOTE - Follow Up Consult  Pharmacy Consult for Heparin Indication: IABP  No Known Allergies  Patient Measurements: Height: 5\' 10"  (177.8 cm) Weight: (!) 343 lb 0.6 oz (155.6 kg) IBW/kg (Calculated) : 73 Heparin Dosing Weight: ~99.5kg  Vital Signs: Temp: 97.7 F (36.5 C) (12/05 0800) Temp Source: Core (Comment) (12/05 0400) BP: 141/88 mmHg (12/05 0800) Pulse Rate: 77 (12/05 0351)  Labs:  Recent Labs  2015/06/25 1853 2015/06/25 2309  05/29/15 0445 05/29/15 0502 05/29/15 1000  05/30/15 0315 05/30/15 0420 05/30/15 1335 05/30/15 1715 05/30/15 2055 05/31/15 0145 05/31/15 0450  HGB  --   --   --  13.5 15.0  --   --  12.9*  --   --   --   --  12.1*  --   HCT  --   --   --  42.2 44.0  --   --  38.6*  --   --   --   --  34.5*  --   PLT  --   --   --  189  --   --   --  220  --   --   --   --  195  --   HEPARINUNFRC  --   --   --   --   --  0.11*  < >  --  <0.10* <0.10*  --  0.24*  --  0.33  CREATININE  --   --   < > 2.24* 1.90*  --   < >  --  1.80*  --  1.86*  --  1.83*  --   CKTOTAL 49  --   --   --   --   --   --   --   --   --   --   --   --   --   CKMB 8.9*  --   --   --   --   --   --   --   --   --   --   --   --   --   TROPONINI  --  >65.00*  --  >65.00*  --  >65.00*  --   --   --   --   --   --   --   --   < > = values in this interval not displayed.  Estimated Creatinine Clearance: 65.2 mL/min (by C-G formula based on Cr of 1.83).   Medical History: Past Medical History  Diagnosis Date  . Kidney calculi   . Hypertension   . History of TIAs   . Arthritis   . Back pain     trouble with lumbar 2, 3, and 4 - degenerative disease per pt  . Unspecified transient cerebral ischemia   . HA (headache)   . Syncope and collapse   . BPH (benign prostatic hyperplasia)   . GERD (gastroesophageal reflux disease)   . Diabetes mellitus without complication (HCC)   . Seizure (HCC)   . BPH (benign prostatic hyperplasia)   . Sarcoidosis of lung (HCC)    . Tobacco abuse    Assessment: 58yom presented on 25-Jun-2015 to [facility] with acute appendicitis and was taken to the OR for a laparoscopic appendectomy. In the OR he went into cardiac arrest. EKG showed CHB and ST elevations so Code STEMI was called. He was transferred to [facility] and taken emergently to the cath lab where he received a BMS to his  RCA, temporary pacemaker and IABP were placed. He was started on heparin. He has also been continued on aggrastat x 18 hours.  Heparin level 0.33 at goal heparin drip 2500 uts/hr Heparin is infusing peripherally, no interruptions in gtt and no infiltration noted. H/H has trended down but stable, plt remain wnl and stable. Having some pink-red tinged urine but no other significant s/s bleeding. Will continue to monitor closely.    Goal of Therapy:  Heparin level 0.2-0.5 units/ml Monitor platelets by anticoagulation protocol: Yes   Plan:  1) Continue heparin to 2500 units/hr  2) Daily heparin level and CBC    Leota Sauers Pharm.D. CPP, BCPS Clinical Pharmacist [phone] 05/31/2015 9:01 AM

## 2015-05-31 NOTE — Progress Notes (Signed)
ABG results called earlier and BMET results just now called to Dr Molli Knock. Orders received.

## 2015-05-31 NOTE — Progress Notes (Signed)
RT note- Decreased ventilator rate per Dr.Yacoub post ABG.

## 2015-05-31 NOTE — Progress Notes (Signed)
Patient ID: Iantha Fallen, male   DOB: 04-11-1956, 59 y.o.   MRN: [id number] 3 Days Post-Op  Subjective: On balloon pump.  On multiple pressors, sedated, but opens eyes with stimulus  Objective: Vital signs in last 24 hours: Temp:  [97.3 F (36.3 C)-98.9 F (37.2 C)] 97.3 F (36.3 C) (12/05 0700) Pulse Rate:  [77-92] 77 (12/05 0351) Resp:  [0-30] 30 (12/05 0351) BP: (82-124)/(60-86) 114/71 mmHg (12/05 0700) SpO2:  [83 %-95 %] 91 % (12/05 0700) Arterial Line BP: (74-117)/(54-108) 75/54 mmHg (12/05 0700) FiO2 (%):  [10 %-100 %] 100 % (12/05 0351) Weight:  [155.6 kg (343 lb 0.6 oz)] 155.6 kg (343 lb 0.6 oz) (12/05 0314) Last BM Date: 05/27/15  Intake/Output from previous day: 12/04 0701 - 12/05 0700 In: 11402.5 [I.V.:9982.5; NG/GT:100; IV Piggyback:1320] Out: 3290 [Urine:3290] Intake/Output this shift:    PE: Abd: soft, seems tender as he opens his eyes with palpation, all 3 incisions are covered and dry.  Few BS  Lab Results:   Recent Labs  05/30/15 0315 05/31/15 0145  WBC 18.8* 17.9*  HGB 12.9* 12.1*  HCT 38.6* 34.5*  PLT 220 195   BMET  Recent Labs  05/30/15 1715 05/31/15 0145  NA 129* 127*  K 3.0* 2.9*  CL 91* 87*  CO2 28 29  GLUCOSE 192* 179*  BUN 31* 34*  CREATININE 1.86* 1.83*  CALCIUM 6.3* 6.1*   PT/INR No results for input(s): LABPROT, INR in the last 72 hours. CMP     Component Value Date/Time   NA 127* 05/31/2015 0145   NA 140 01/15/2014 0910   K 2.9* 05/31/2015 0145   CL 87* 05/31/2015 0145   CO2 29 05/31/2015 0145   GLUCOSE 179* 05/31/2015 0145   GLUCOSE 121* 01/15/2014 0910   BUN 34* 05/31/2015 0145   BUN 9 01/15/2014 0910   CREATININE 1.83* 05/31/2015 0145   CALCIUM 6.1* 05/31/2015 0145   PROT 5.5* 06/24/15 1852   PROT 6.8 01/15/2014 0910   ALBUMIN 3.0* 06/24/2015 1852   ALBUMIN 4.4 01/15/2014 0910   AST 25 24-Jun-2015 1852   ALT 16* 06/24/15 1852   ALKPHOS 60 06/24/15 1852   BILITOT 1.3* 2015/06/24 1852   GFRNONAA 39*  05/31/2015 0145   GFRAA 45* 05/31/2015 0145   Lipase     Component Value Date/Time   LIPASE 21 06/24/2015 0445       Studies/Results: Dg Chest Port 1 View  05/31/2015  CLINICAL DATA:  Acute respiratory failure EXAM: PORTABLE CHEST 1 VIEW COMPARISON:  Yesterday FINDINGS: Endotracheal tube tip in good position at the clavicular heads. The aortic balloon pump tip is just below the level of the aortic arch. Swan-Ganz catheter, tip directed towards the inner lobar pulmonary artery on the right. Left IJ central line, tip directed towards the SVC origin. Unchanged hazy appearance of the chest compatible the atelectasis and pleural fluid. Pulmonary venous congestion with cephalized blood flow. No pneumothorax. Stable normal heart size. IMPRESSION: 1. Stable positioning of tubes and lines. 2. Unchanged edema, effusions, and atelectasis. Electronically Signed   By: Marnee Spring M.D.   On: 05/31/2015 07:17   Dg Chest Port 1 View  05/30/2015  CLINICAL DATA:  Central line placed EXAM: PORTABLE CHEST 1 VIEW COMPARISON:  Chest radiograph from earlier today. FINDINGS: Endotracheal tube tip is 5.3 cm above the carina. Left internal jugular central venous catheter terminates over the left brachiocephalic vein. Enteric tube enters the lower thoracic esophagus, with the tip below the inferior margin  of this image. Right internal jugular Swan-Ganz catheter terminates in the bronchus intermedius branch of the right pulmonary artery. Intra-aortic balloon pump overlies the proximal descending thoracic aorta at the T5 level. Stable cardiomediastinal silhouette with mild cardiomegaly. No pneumothorax. Worsening small bilateral pleural effusions. Mild to moderate pulmonary edema, worsened. Bibasilar atelectasis, worsened. IMPRESSION: 1. Right internal jugular Swan-Ganz catheter terminates in the bronchus intermedius branch of the right pulmonary artery, recommend 5 cm retraction. 2. No pneumothorax. 3. Mild-to-moderate  congestive heart failure, worsened. 4. Small bilateral pleural effusions, increased bilaterally. 5. Worsening bibasilar atelectasis. Electronically Signed   By: Delbert Phenix M.D.   On: 05/30/2015 13:16   Dg Chest Port 1 View  05/30/2015  CLINICAL DATA:  Acute respiratory failure. History of sarcoidosis and diabetes. EXAM: PORTABLE CHEST 1 VIEW COMPARISON:  05/29/2015 and 12/02/2014. FINDINGS: 1037 hours. The endotracheal tube, nasogastric tube and left IJ central venous catheter appear unchanged. External pacers overlie the left chest. The heart size and mediastinal contours are stable from yesterday. There are lower lung volumes with increased vascular congestion and increased patchy opacities at both lung bases. There may be small bilateral pleural effusions. No consolidation or pneumothorax identified. IMPRESSION: Worsening pulmonary aeration with increased bibasilar airspace opacities and possible pleural effusions. Widening of the superior mediastinum is unchanged from yesterday, likely at least partly due to lower lung volumes and AP technique. Electronically Signed   By: Carey Bullocks M.D.   On: 05/30/2015 11:33    Anti-infectives: Anti-infectives    Start     Dose/Rate Route Frequency Ordered Stop   05/30/15 0600  vancomycin (VANCOCIN) 1,250 mg in sodium chloride 0.9 % 250 mL IVPB     1,250 mg 166.7 mL/hr over 90 Minutes Intravenous Every 24 hours 05/29/15 0237     05/29/15 1200  meropenem (MERREM) 1 g in sodium chloride 0.9 % 100 mL IVPB     1 g 200 mL/hr over 30 Minutes Intravenous Every 8 hours 05/29/15 0237     05/29/15 0600  piperacillin-tazobactam (ZOSYN) IVPB 3.375 g  Status:  Discontinued     3.375 g 12.5 mL/hr over 240 Minutes Intravenous 3 times per day 06/11/2015 2256 05/29/15 0216   05/29/15 0245  vancomycin (VANCOCIN) 2,500 mg in sodium chloride 0.9 % 500 mL IVPB     2,500 mg 250 mL/hr over 120 Minutes Intravenous  Once 05/29/15 0237 05/29/15 0612   05/29/15 0245  meropenem  (MERREM) 1 g in sodium chloride 0.9 % 100 mL IVPB     1 g 200 mL/hr over 30 Minutes Intravenous  Once 05/29/15 0237 05/29/15 0339   06/17/2015 2315  piperacillin-tazobactam (ZOSYN) IVPB 3.375 g     3.375 g 100 mL/hr over 30 Minutes Intravenous  Once 05/27/2015 2300 05/29/15 0124   06/01/2015 0830  cefTRIAXone (ROCEPHIN) 2 g in dextrose 5 % 50 mL IVPB  Status:  Discontinued     2 g 100 mL/hr over 30 Minutes Intravenous Every 24 hours 06/26/2015 0815 06/05/2015 2256   06/24/2015 0830  metroNIDAZOLE (FLAGYL) IVPB 500 mg  Status:  Discontinued     500 mg 100 mL/hr over 60 Minutes Intravenous Every 8 hours 06/01/2015 0815 06/25/2015 2256   06/19/2015 0715  piperacillin-tazobactam (ZOSYN) IVPB 3.375 g     3.375 g 12.5 mL/hr over 240 Minutes Intravenous  Once 06/06/2015 0700 06/12/2015 0907       Assessment/Plan  1. POD 3, s/p aborted lap appy due to VF arrest, still with acute appendicitis -cont IV abx  therapy to help control appendicitis. -cont bowel rest for now, but may need to think about nutritional support soon 2. Obstructing right ureteral stone with hydronephrosis -urology saw at [facility] and didn't feel as if intervention needed at this time as his creatinine was normal.  Cr up now, but more likely secondary to shock than stone, but can follow 3. VF arrest with multiple cardiac issues -defer to cardiology and CCM.  LOS: 3 days    [place] Huberty E 05/31/2015, 7:35 AM Pager: [phone]

## 2015-05-31 NOTE — Progress Notes (Signed)
eLink Physician-Brief Progress Note Patient Name: Scott Holden DOB: 1955/10/31 MRN: [id number]   Date of Service  05/31/2015  HPI/Events of Note  Low K and Mg.  eICU Interventions  K x6 runs IV and Mg x2 IV.     Intervention Category Major Interventions: Other:  Cordie Buening 05/31/2015, 6:32 PM

## 2015-05-31 NOTE — Progress Notes (Signed)
eLink Physician-Brief Progress Note Patient Name: Scott Holden DOB: 13-Oct-1955 MRN: [id number]   Date of Service  05/31/2015  HPI/Events of Note  Cardiogenic shock, recent APPY/peritonitis, balloon pump, multiple pressors, milrinone, large MI, RHC today with SVR > 2000 RN calling for shock and afib, shock worse after afib Cardiology on call recommending against cardioversion  eICU Interventions  Check magnesium  Consider cardioversion if pressure worsens Maintain levophed as prescribed     Intervention Category Major Interventions: Arrhythmia - evaluation and management;Shock - evaluation and management  Max Fickle 05/31/2015, 2:01 AM

## 2015-06-01 ENCOUNTER — Inpatient Hospital Stay (HOSPITAL_COMMUNITY): Payer: Medicaid Other

## 2015-06-01 DIAGNOSIS — K3589 Other acute appendicitis: Secondary | ICD-10-CM

## 2015-06-01 LAB — CBC
HEMATOCRIT: 32.2 % — AB (ref 39.0–52.0)
HEMOGLOBIN: 11.3 g/dL — AB (ref 13.0–17.0)
MCH: 29 pg (ref 26.0–34.0)
MCHC: 35.1 g/dL (ref 30.0–36.0)
MCV: 82.6 fL (ref 78.0–100.0)
Platelets: 190 10*3/uL (ref 150–400)
RBC: 3.9 MIL/uL — AB (ref 4.22–5.81)
RDW: 13.2 % (ref 11.5–15.5)
WBC: 19.4 10*3/uL — AB (ref 4.0–10.5)

## 2015-06-01 LAB — BASIC METABOLIC PANEL
ANION GAP: 7 (ref 5–15)
ANION GAP: 11 (ref 5–15)
Anion gap: 10 (ref 5–15)
BUN: 39 mg/dL — ABNORMAL HIGH (ref 6–20)
BUN: 41 mg/dL — ABNORMAL HIGH (ref 6–20)
BUN: 45 mg/dL — AB (ref 6–20)
CALCIUM: 5.9 mg/dL — AB (ref 8.9–10.3)
CALCIUM: 6.1 mg/dL — AB (ref 8.9–10.3)
CHLORIDE: 87 mmol/L — AB (ref 101–111)
CO2: 31 mmol/L (ref 22–32)
CO2: 31 mmol/L (ref 22–32)
CO2: 31 mmol/L (ref 22–32)
CREATININE: 1.85 mg/dL — AB (ref 0.61–1.24)
Calcium: 6 mg/dL — CL (ref 8.9–10.3)
Chloride: 84 mmol/L — ABNORMAL LOW (ref 101–111)
Chloride: 83 mmol/L — ABNORMAL LOW (ref 101–111)
Creatinine, Ser: 1.79 mg/dL — ABNORMAL HIGH (ref 0.61–1.24)
Creatinine, Ser: 1.67 mg/dL — ABNORMAL HIGH (ref 0.61–1.24)
GFR calc Af Amer: 50 mL/min — ABNORMAL LOW (ref >60)
GFR calc non Af Amer: 40 mL/min — ABNORMAL LOW (ref >60)
GFR, EST AFRICAN AMERICAN: 46 mL/min — AB (ref >60)
GFR, EST AFRICAN AMERICAN: 44 mL/min — AB (ref >60)
GFR, EST NON AFRICAN AMERICAN: 43 mL/min — AB (ref >60)
GFR, EST NON AFRICAN AMERICAN: 38 mL/min — AB (ref >60)
GLUCOSE: 145 mg/dL — AB (ref 65–99)
Glucose, Bld: 160 mg/dL — ABNORMAL HIGH (ref 65–99)
Glucose, Bld: 135 mg/dL — ABNORMAL HIGH (ref 65–99)
POTASSIUM: 3.2 mmol/L — AB (ref 3.5–5.1)
POTASSIUM: 3.3 mmol/L — AB (ref 3.5–5.1)
Potassium: 3.8 mmol/L (ref 3.5–5.1)
SODIUM: 124 mmol/L — AB (ref 135–145)
Sodium: 125 mmol/L — ABNORMAL LOW (ref 135–145)
Sodium: 126 mmol/L — ABNORMAL LOW (ref 135–145)

## 2015-06-01 LAB — HEPARIN LEVEL (UNFRACTIONATED): Heparin Unfractionated: 0.54 IU/mL (ref 0.30–0.70)

## 2015-06-01 LAB — POCT I-STAT 3, ART BLOOD GAS (G3+)
ACID-BASE EXCESS: 10 mmol/L — AB (ref 0.0–2.0)
ACID-BASE EXCESS: 6 mmol/L — AB (ref 0.0–2.0)
Bicarbonate: 31.6 mEq/L — ABNORMAL HIGH (ref 20.0–24.0)
Bicarbonate: 34.2 mEq/L — ABNORMAL HIGH (ref 20.0–24.0)
O2 SAT: 97 %
O2 SAT: 81 %
PCO2 ART: 65.3 mmHg — AB (ref 35.0–45.0)
PH ART: 7.597 — AB (ref 7.350–7.450)
PO2 ART: 76 mmHg — AB (ref 80.0–100.0)
Patient temperature: 98.1
TCO2: 33 mmol/L (ref 0–100)
TCO2: 36 mmol/L (ref 0–100)
pCO2 arterial: 32.4 mmHg — ABNORMAL LOW (ref 35.0–45.0)
pH, Arterial: 7.326 — ABNORMAL LOW (ref 7.350–7.450)
pO2, Arterial: 50 mmHg — ABNORMAL LOW (ref 80.0–100.0)

## 2015-06-01 LAB — CALCIUM, IONIZED: Calcium, Ionized, Serum: 3.5 mg/dL — ABNORMAL LOW (ref 4.5–5.6)

## 2015-06-01 LAB — BLOOD GAS, ARTERIAL
Acid-Base Excess: 6.3 mmol/L — ABNORMAL HIGH (ref 0.0–2.0)
BICARBONATE: 31 meq/L — AB (ref 20.0–24.0)
DRAWN BY: 406621
FIO2: 1
MECHVT: 440 mL
O2 SAT: 95.4 %
PATIENT TEMPERATURE: 98.6
PCO2 ART: 50.6 mmHg — AB (ref 35.0–45.0)
PEEP: 12 cmH2O
PO2 ART: 88.1 mmHg (ref 80.0–100.0)
RATE: 20 resp/min
TCO2: 32.5 mmol/L (ref 0–100)
pH, Arterial: 7.404 (ref 7.350–7.450)

## 2015-06-01 LAB — GLUCOSE, CAPILLARY
GLUCOSE-CAPILLARY: 157 mg/dL — AB (ref 65–99)
GLUCOSE-CAPILLARY: 128 mg/dL — AB (ref 65–99)
GLUCOSE-CAPILLARY: 131 mg/dL — AB (ref 65–99)
Glucose-Capillary: 153 mg/dL — ABNORMAL HIGH (ref 65–99)
Glucose-Capillary: 138 mg/dL — ABNORMAL HIGH (ref 65–99)

## 2015-06-01 LAB — CARBOXYHEMOGLOBIN
CARBOXYHEMOGLOBIN: 0.7 % (ref 0.5–1.5)
METHEMOGLOBIN: 1.4 % (ref 0.0–1.5)
O2 SAT: 67.6 %
TOTAL HEMOGLOBIN: 11.4 g/dL — AB (ref 13.5–18.0)

## 2015-06-01 LAB — MAGNESIUM: Magnesium: 2.1 mg/dL (ref 1.7–2.4)

## 2015-06-01 MED ORDER — DEXTROSE 5 % IV SOLN
500.0000 mg | Freq: Once | INTRAVENOUS | Status: AC
  Administered 2015-06-01: 500 mg via INTRAVENOUS
  Filled 2015-06-01: qty 500

## 2015-06-01 MED ORDER — PANTOPRAZOLE SODIUM 40 MG PO PACK
40.0000 mg | PACK | Freq: Every day | ORAL | Status: DC
  Administered 2015-06-01 – 2015-06-09 (×9): 40 mg
  Filled 2015-06-01 (×9): qty 20

## 2015-06-01 MED ORDER — PRISMASOL BGK 4/2.5 32-4-2.5 MEQ/L IV SOLN
INTRAVENOUS | Status: DC
  Administered 2015-06-02 – 2015-06-09 (×19): via INTRAVENOUS_CENTRAL
  Filled 2015-06-01 (×21): qty 5000

## 2015-06-01 MED ORDER — PRISMASOL BGK 4/2.5 32-4-2.5 MEQ/L IV SOLN
INTRAVENOUS | Status: DC
  Administered 2015-06-02 – 2015-06-09 (×34): via INTRAVENOUS_CENTRAL
  Filled 2015-06-01 (×40): qty 5000

## 2015-06-01 MED ORDER — HEPARIN (PORCINE) 2000 UNITS/L FOR CRRT
INTRAVENOUS_CENTRAL | Status: DC | PRN
  Filled 2015-06-01: qty 1000

## 2015-06-01 MED ORDER — PRISMASOL BGK 4/2.5 32-4-2.5 MEQ/L IV SOLN
INTRAVENOUS | Status: DC
  Administered 2015-06-02 – 2015-06-09 (×8): via INTRAVENOUS_CENTRAL
  Filled 2015-06-01 (×13): qty 5000

## 2015-06-01 MED ORDER — METOLAZONE 5 MG PO TABS
5.0000 mg | ORAL_TABLET | Freq: Once | ORAL | Status: AC
  Administered 2015-06-01: 5 mg via ORAL
  Filled 2015-06-01: qty 1

## 2015-06-01 MED ORDER — SODIUM CHLORIDE 0.9 % IV SOLN
2.0000 g | Freq: Once | INTRAVENOUS | Status: AC
  Administered 2015-06-01: 2 g via INTRAVENOUS
  Filled 2015-06-01: qty 20

## 2015-06-01 MED ORDER — POTASSIUM CHLORIDE 20 MEQ/15ML (10%) PO SOLN
40.0000 meq | ORAL | Status: AC
  Administered 2015-06-01 (×3): 40 meq
  Filled 2015-06-01 (×3): qty 30

## 2015-06-01 MED ORDER — POTASSIUM CHLORIDE 10 MEQ/50ML IV SOLN: 10.0000 meq | INTRAVENOUS | Status: DC

## 2015-06-01 MED ORDER — POTASSIUM CHLORIDE 10 MEQ/50ML IV SOLN
10.0000 meq | INTRAVENOUS | Status: AC
  Administered 2015-06-01 (×3): 10 meq via INTRAVENOUS
  Filled 2015-06-01 (×3): qty 50

## 2015-06-01 MED ORDER — METOLAZONE 2.5 MG PO TABS
2.5000 mg | ORAL_TABLET | Freq: Once | ORAL | Status: AC
  Administered 2015-06-01: 2.5 mg via ORAL
  Filled 2015-06-01: qty 1

## 2015-06-01 MED ORDER — HEPARIN SODIUM (PORCINE) 1000 UNIT/ML DIALYSIS
1000.0000 [IU] | INTRAMUSCULAR | Status: DC | PRN
  Administered 2015-06-02: 3000 [IU] via INTRAVENOUS_CENTRAL
  Administered 2015-06-09: 3200 [IU] via INTRAVENOUS_CENTRAL
  Filled 2015-06-01: qty 1
  Filled 2015-06-01 (×2): qty 6
  Filled 2015-06-01: qty 3
  Filled 2015-06-01: qty 6

## 2015-06-01 MED ORDER — MAGNESIUM SULFATE 2 GM/50ML IV SOLN
2.0000 g | Freq: Once | INTRAVENOUS | Status: AC
  Administered 2015-06-01: 2 g via INTRAVENOUS
  Filled 2015-06-01: qty 50

## 2015-06-01 MED ORDER — SODIUM CHLORIDE 0.9 % IV SOLN
2.0000 g | Freq: Once | INTRAVENOUS | Status: DC
  Filled 2015-06-01: qty 20

## 2015-06-01 MED ORDER — VITAL HIGH PROTEIN PO LIQD
1000.0000 mL | ORAL | Status: DC
  Filled 2015-06-01 (×3): qty 1000

## 2015-06-01 NOTE — Progress Notes (Signed)
Advanced Heart Failure Rounding Note   Subjective:    Remains intubated/sedated. On lasix gtt but still up 2.4 L.  40L positive since admit. VT/VF quiescent x 36 hours  Remains on dopamine 2, levophed 40, milrinone 0.375 and vasopressin. + IABP @ 1:2  Co-ox 68%   Creatinine stable. Oxygenation improved. Supping electrolytes. On stress dose steroids.   Swan numbers done personally at bedside  RA 19 PA 35/26 PCWP 21 Thermo 6.9/2.7   Objective:   Weight Range:  Vital Signs:   Temp:  [97.7 F (36.5 C)-98.4 F (36.9 C)] 97.9 F (36.6 C) (12/06 0700) Pulse Rate:  [78-81] 78 (12/06 0003) Resp:  [26-30] 26 (12/06 0312) BP: (99-141)/(60-88) 102/60 mmHg (12/06 0700) SpO2:  [88 %-96 %] 96 % (12/06 0700) Arterial Line BP: (83-137)/(57-87) 101/61 mmHg (12/06 0700) FiO2 (%):  [80 %-100 %] 100 % (12/06 0400) Weight:  [160 kg (352 lb 11.8 oz)] 160 kg (352 lb 11.8 oz) (12/06 0400) Last BM Date: 05/27/15  Weight change: Filed Weights   05/30/15 2200 05/31/15 0314 06/01/15 0400  Weight: 155.6 kg (343 lb 0.6 oz) 155.6 kg (343 lb 0.6 oz) 160 kg (352 lb 11.8 oz)    Intake/Output:   Intake/Output Summary (Last 24 hours) at 06/01/15 0704 Last data filed at 06/01/15 0700  Gross per 24 hour  Intake 7042.15 ml  Output   4610 ml  Net 2432.15 ml     Physical Exam: General:  Intubated sedated. HEENT: normal x for ETT Neck: supple. RIJ swan Carotids 2+ bilat;  No lymphadenopathy or thryomegaly appreciated. Cor: PMI nonpalpable. Regular rate & rhythm. No rubs, gallops or murmurs. Lungs: clear anteriorly Abdomen: Markedly obese. Soft. Hypoactive BS Extremities: left femoral IABP no cyanosis, clubbing, rash, 1+ edema Neuro: sedated no focal  Telemetry: NSR   Labs: Basic Metabolic Panel:  Recent Labs Lab 05/29/15 0445  05/29/15 1618  05/30/15 0534 05/30/15 1715 05/31/15 0145 05/31/15 0900 05/31/15 1715 06/01/15 0125  NA 132*  < > 129*  < >  --  129* 127* 127* 124* 125*   K 3.3*  < > 4.1  < >  --  3.0* 2.9* 4.4 2.8* 3.2*  CL 98*  < > 96*  < >  --  91* 87* 85* 83* 87*  CO2 19*  --  23  < >  --  34* 31  GLUCOSE 311*  < > 207*  < >  --  192* 179* 186* 171* 160*  BUN 18  < > 20  < >  --  31* 34* 35* 34* 39*  CREATININE 2.24*  < > 1.94*  < >  --  1.86* 1.83* 1.82* 1.81* 1.79*  CALCIUM 7.8*  --  6.6*  < >  --  6.3* 6.1* 6.3* 6.1* 5.9*  MG 1.7  --  1.8  --  1.8  --  1.8  --  1.8  --   < > = values in this interval not displayed.  Liver Function Tests:  Recent Labs Lab 06/19/2015 0445 06/25/2015 1852  AST 20 25  ALT 17 16*  ALKPHOS 86 60  BILITOT 0.7 1.3*  PROT 7.1 5.5*  ALBUMIN 4.1 3.0*    Recent Labs Lab 06/12/2015 0445  LIPASE 21   No results for input(s): AMMONIA in the last 168 hours.  CBC:  Recent Labs Lab 06/03/2015 1852 05/29/15 0445 05/29/15 0502 05/30/15 0315 05/31/15 0145 06/01/15 0125  WBC 17.1* 21.4*  --  18.8* 17.9* 19.4*  NEUTROABS 15.4*  --   --   --   --   --   HGB 13.4 13.5 15.0 12.9* 12.1* 11.3*  HCT 40.8 42.2 44.0 38.6* 34.5* 32.2*  MCV 91.7 91.5  --  88.7 83.9 82.6  PLT 209 189  --  220 195 190    Cardiac Enzymes:  Recent Labs Lab Jun 14, 2015 1852 2015-06-14 1853 Jun 14, 2015 2309 05/29/15 0445 05/29/15 1000  CKTOTAL  --  49  --   --   --   CKMB  --  8.9*  --   --   --   TROPONINI 0.65*  --  >65.00* >65.00* >65.00*    BNP: BNP (last 3 results) No results for input(s): BNP in the last 8760 hours.  ProBNP (last 3 results) No results for input(s): PROBNP in the last 8760 hours.    Other results:  Imaging: Dg Chest Port 1 View  05/31/2015  CLINICAL DATA:  Acute respiratory failure EXAM: PORTABLE CHEST 1 VIEW COMPARISON:  Yesterday FINDINGS: Endotracheal tube tip in good position at the clavicular heads. The aortic balloon pump tip is just below the level of the aortic arch. Swan-Ganz catheter, tip directed towards the inner lobar pulmonary artery on the right. Left IJ central line, tip directed towards the  SVC origin. Unchanged hazy appearance of the chest compatible the atelectasis and pleural fluid. Pulmonary venous congestion with cephalized blood flow. No pneumothorax. Stable normal heart size. IMPRESSION: 1. Stable positioning of tubes and lines. 2. Unchanged edema, effusions, and atelectasis. Electronically Signed   By: Marnee Spring M.D.   On: 05/31/2015 07:17   Dg Chest Port 1 View  05/30/2015  CLINICAL DATA:  Central line placed EXAM: PORTABLE CHEST 1 VIEW COMPARISON:  Chest radiograph from earlier today. FINDINGS: Endotracheal tube tip is 5.3 cm above the carina. Left internal jugular central venous catheter terminates over the left brachiocephalic vein. Enteric tube enters the lower thoracic esophagus, with the tip below the inferior margin of this image. Right internal jugular Swan-Ganz catheter terminates in the bronchus intermedius branch of the right pulmonary artery. Intra-aortic balloon pump overlies the proximal descending thoracic aorta at the T5 level. Stable cardiomediastinal silhouette with mild cardiomegaly. No pneumothorax. Worsening small bilateral pleural effusions. Mild to moderate pulmonary edema, worsened. Bibasilar atelectasis, worsened. IMPRESSION: 1. Right internal jugular Swan-Ganz catheter terminates in the bronchus intermedius branch of the right pulmonary artery, recommend 5 cm retraction. 2. No pneumothorax. 3. Mild-to-moderate congestive heart failure, worsened. 4. Small bilateral pleural effusions, increased bilaterally. 5. Worsening bibasilar atelectasis. Electronically Signed   By: Delbert Phenix M.D.   On: 05/30/2015 13:16   Dg Chest Port 1 View  05/30/2015  CLINICAL DATA:  Acute respiratory failure. History of sarcoidosis and diabetes. EXAM: PORTABLE CHEST 1 VIEW COMPARISON:  05/29/2015 and 12/02/2014. FINDINGS: 1037 hours. The endotracheal tube, nasogastric tube and left IJ central venous catheter appear unchanged. External pacers overlie the left chest. The heart size  and mediastinal contours are stable from yesterday. There are lower lung volumes with increased vascular congestion and increased patchy opacities at both lung bases. There may be small bilateral pleural effusions. No consolidation or pneumothorax identified. IMPRESSION: Worsening pulmonary aeration with increased bibasilar airspace opacities and possible pleural effusions. Widening of the superior mediastinum is unchanged from yesterday, likely at least partly due to lower lung volumes and AP technique. Electronically Signed   By: Carey Bullocks M.D.   On: 05/30/2015 11:33     Medications:  Scheduled Medications: . antiseptic oral rinse  7 mL Mouth Rinse 10 times per day  . aspirin  81 mg Per Tube Daily  . atorvastatin  80 mg Per NG tube q1800  . chlorhexidine gluconate  15 mL Mouth Rinse BID  . Chlorhexidine Gluconate Cloth  6 each Topical Q0600  . Chlorhexidine Gluconate Cloth  6 each Topical Q0600  . clopidogrel  75 mg Per NG tube Daily  . fentaNYL (SUBLIMAZE) injection  50 mcg Intravenous Once  . hydrocortisone sod succinate (SOLU-CORTEF) inj  100 mg Intravenous Q8H  . Influenza vac split quadrivalent PF  0.5 mL Intramuscular Tomorrow-1000  . insulin aspart  0-20 Units Subcutaneous 6 times per day  . meropenem (MERREM) IV  1 g Intravenous Q8H  . mupirocin ointment  1 application Nasal BID  . pantoprazole (PROTONIX) IV  40 mg Intravenous Q24H  . sodium chloride  10-40 mL Intracatheter Q12H  . sodium chloride  3 mL Intravenous Q12H  . vancomycin  1,250 mg Intravenous Q24H    Infusions: . sodium chloride Stopped (05/30/15 1130)  . sodium chloride 10 mL/hr (05/31/15 1200)  . amiodarone 60 mg/hr (06/01/15 0216)  . DOPamine 2.5 mcg/kg/min (06/01/15 0530)  . fentaNYL infusion INTRAVENOUS 200 mcg/hr (05/31/15 2000)  . furosemide (LASIX) infusion 15 mg/hr (05/31/15 2342)  . heparin 2,500 Units/hr (05/31/15 2049)  . lidocaine 2 mg/min (06/01/15 0200)  . midazolam (VERSED) infusion  4 mg/hr (05/31/15 2044)  . milrinone 0.375 mcg/kg/min (06/01/15 0216)  . norepinephrine (LEVOPHED) Adult infusion 40 mcg/min (06/01/15 0636)  . vasopressin (PITRESSIN) infusion - *FOR SHOCK* 0.03 Units/min (05/31/15 2044)    PRN Medications: sodium chloride, fentaNYL, midazolam, midazolam, morphine injection, ondansetron **OR** ondansetron (ZOFRAN) IV, simethicone, sodium chloride, sodium chloride   Assessment:   1. Acute systolic HF with cardiogenic shock with biventricular dysfunction R>L 2. Acute inferior STEMI with RV involvement   --s/p PCI RCA on 12/2 3. Acute respiratory failure 4. AKI 5. Acute appendicitis being treated medically 6. Recurrent VT/VF 7. New onset AF 8. Hypomagnesemia 9. Hypocalcemia 10. Hypokalemia  Plan/Discussion:    Continues to stabilize.  No VT/VF X 36 hours. Swan numbers stableon multiple pressors. Will continue IABP at 1:2. He remains massively volume overloaded.   Will wean lido to 1 and amio to 30. Increase lasix and add metolazone to facilitate diuresis. Drips have been concentrated. Will try to supp electrolytes orally to limit fluid we give him.  Will consider removing IABP tomorrow if off dopamine and vasopressin. Continue broad spectrum abx. Vent management per CCM.   He remains critically ill but seems to be stabilizing. Will continue aggressive care at this point.   The patient is critically ill with multiple organ systems failure and requires high complexity decision making for assessment and support, frequent evaluation and titration of therapies, application of advanced monitoring technologies and extensive interpretation of multiple databases.   Critical Care Time devoted to patient care services described in this note is 45 Minutes.  Length of Stay: 4  Bensimhon, Daniel MD 06/01/2015, 7:04 AM  Advanced Heart Failure Team Pager [phone] (M-F; 7a - 4p)  Please contact [facility] for night-coverage after hours (4p -7a ) and  weekends on [email or web address]

## 2015-06-01 NOTE — Progress Notes (Signed)
CRITICAL VALUE ALERT  Critical value received:  Calcium 6.1  Date of notification:  06/01/15  Time of notification:  1920  Critical value read back:Yes.    Nurse who received alert:  Lexi  MD notified (1st page):  [facility] nurse Marisue Ivan notified  Time of first page:  1930  MD notified (2nd page):  Time of second page:  Responding MD:  Pola Corn called  Time MD responded:  1930

## 2015-06-01 NOTE — Consult Note (Signed)
Reason for Consult:ARF and volume overload Referring Physician: Gala Romney, MD  Scott Holden is an 59 y.o. male.  HPI: Pt is a 59 yo M with PMH sig for HTN, DM, nephrolithiasis, tobacco abuse, and TIA's who presented to [facility] on Jun 14, 2015 with right sided abdominal pain and was found to have acute appendicitis.  He went for surgery, however after the first trocar was introduced, he suffered  a VT cardiac arrest on the table.  He was transferred to [facility] after a code STEMI was called and the surgical intervention was aborted. In the cath lab an RCA DES was placed as well as insertion of an IABP for MI and cardiogenic shock. He was also started on pressors and transferred to the CCU.  His Scr peaked at 2.24 following cath and was improving until today when his urine output diminished and has had increasing oxygen demands with +40L intake.  We were consulted to further evaluate his ARF and consider initiation of CVVHDF for volume management as he is not responding to increasing lasix gtt.  Of note, CT scan revealed hydro of right ureter with evidence of 6x26mm calculus with bilateral hydronephrosis (marked hydro on R).   He has been net + every day and is over 40 liters + due to the need for multiple medications including pressors.  His oxygen sats are in the low to mid 80's with 100% FIO2.  Trend in Creatinine: CREATININE, SER  Date/Time Value Ref Range Status  06/01/2015 06:36 PM 1.85* 0.61 - 1.24 mg/dL Final  16/03/9603 54:09 AM 1.67* 0.61 - 1.24 mg/dL Final  81/19/1478 29:56 AM 1.79* 0.61 - 1.24 mg/dL Final  21/30/8657 84:69 PM 1.81* 0.61 - 1.24 mg/dL Final  62/95/2841 32:44 AM 1.82* 0.61 - 1.24 mg/dL Final  06/28/7251 66:44 AM 1.83* 0.61 - 1.24 mg/dL Final  03/47/4259 56:38 PM 1.86* 0.61 - 1.24 mg/dL Final  75/64/3329 51:88 AM 1.80* 0.61 - 1.24 mg/dL Final  41/66/0630 16:01 PM 1.94* 0.61 - 1.24 mg/dL Final  09/32/3557 32:20 AM 1.90* 0.61 - 1.24 mg/dL Final  25/42/7062 37:62 AM 2.24* 0.61 - 1.24  mg/dL Final  83/15/1761 60:73 AM 1.49* 0.61 - 1.24 mg/dL Final  71/11/2692 85:46 PM 1.51* 0.61 - 1.24 mg/dL Final  27/08/5007 38:18 AM 1.04 0.61 - 1.24 mg/dL Final  29/93/7169 67:89 AM 0.92 0.61 - 1.24 mg/dL Final  38/03/1750 02:58 AM 1.14 0.61 - 1.24 mg/dL Final  52/77/8242 35:36 PM 1.25* 0.61 - 1.24 mg/dL Final  14/43/1540 08:67 AM 1.01 0.61 - 1.24 mg/dL Final  61/95/0932 67:12 PM 0.82 0.61 - 1.24 mg/dL Final  45/80/9983 38:25 PM 0.91 0.61 - 1.24 mg/dL Final  05/39/7673 41:93 AM 0.90 0.76 - 1.27 mg/dL Final  79/07/4095 35:32 AM 0.91 0.50 - 1.35 mg/dL Final  99/24/2683 41:96 AM 0.98 0.50 - 1.35 mg/dL Final  22/29/7989 21:19 AM 1.06 0.50 - 1.35 mg/dL Final  41/74/0814 48:18 AM 0.93 0.50 - 1.35 mg/dL Final  56/31/4970 26:37 AM 0.95 0.50 - 1.35 mg/dL Final  85/88/5027 74:12 PM 0.84 0.50 - 1.35 mg/dL Final  87/86/7672 09:47 AM 1.09 0.50 - 1.35 mg/dL Final  09/62/8366 29:47 PM 0.86 0.50 - 1.35 mg/dL Final  65/46/5035 46:56 PM 0.90 0.50 - 1.35 mg/dL Final  81/27/5170 01:74 AM 0.84 0.50 - 1.35 mg/dL Final  94/49/6759 16:38 PM 0.82 0.50 - 1.35 mg/dL Final  46/65/9935 70:17 AM 0.90 0.50 - 1.35 mg/dL Final  79/39/0300 92:33 AM 0.91 0.50 - 1.35 mg/dL Final  00/76/2263 33:54 AM .8 0.4 -  1.5 mg/dL Final  11/91/4782 95:62 PM 1.0 0.4 - 1.5 mg/dL Final  13/01/6577 46:96 PM 1.0 0.4 - 1.5 mg/dL Final  29/52/8413 24:40 PM 0.97 0.40-1.50 mg/dL Final  04/22/2535 64:40 PM 1.18 0.4 - 1.5 mg/dL Final  34/74/2595 63:87 PM .9 0.4 - 1.5 mg/dL Final  56/43/3295 18:84 PM 1.0 0.4 - 1.5 mg/dL Final  16/60/6301 60:10 PM 1.1 0.4 - 1.5 mg/dL Final  93/23/5573 22:02 AM .9 0.4 - 1.5 mg/dL Final  54/27/0623 0.9 mg/dL   76/28/3151 76:16 PM .9  Final  01/21/2008 09:19 PM 0.90 0.40-1.50 mg/dL Final  07/37/1062 69:48 AM 1.0 0.4-1.5 mg/dL Final  54/62/7035 00:93 PM 0.82  Final    PMH:   Past Medical History  Diagnosis Date  . Kidney calculi   . Hypertension   . History of TIAs   . Arthritis   . Back pain      trouble with lumbar 2, 3, and 4 - degenerative disease per pt  . Unspecified transient cerebral ischemia   . HA (headache)   . Syncope and collapse   . BPH (benign prostatic hyperplasia)   . GERD (gastroesophageal reflux disease)   . Diabetes mellitus without complication (HCC)   . Seizure (HCC)   . BPH (benign prostatic hyperplasia)   . Sarcoidosis of lung (HCC)   . Tobacco abuse     PSH:   Past Surgical History  Procedure Laterality Date  . Total hip arthroplasty    . Joint replacement    . Knee surgery    . Hernia repair  2003  . Cardiac catheterization N/A 05/29/2015    Procedure: Left Heart Cath;  Surgeon: Kathleene Hazel, MD;  Location: [facility] INVASIVE CV LAB;  Service: Cardiovascular;  Laterality: N/A;  . Cardiac catheterization N/A 06/14/2015    Procedure: IABP Insertion;  Surgeon: Kathleene Hazel, MD;  Location: [facility] INVASIVE CV LAB;  Service: Cardiovascular;  Laterality: N/A;  . Cardiac catheterization N/A 06/01/2015    Procedure: Temporary Pacemaker;  Surgeon: Kathleene Hazel, MD;  Location[facility] CV LAB;  Service: Cardiovascular;  Laterality: N/A;  . Laparoscopy  06/06/2015    Procedure: LAPAROSCOPY DIAGNOSTIC;  Surgeon: Glenna Fellows, MD;  Location: [facility] ORS;  Service: General;;    Allergies: No Known Allergies  Medications:   Prior to Admission medications   Medication Sig Start Date End Date Taking? Authorizing Provider  albuterol (PROVENTIL HFA;VENTOLIN HFA) 108 (90 BASE) MCG/ACT inhaler Inhale 1-2 puffs into the lungs every 6 (six) hours as needed for wheezing. 11/27/12  Yes Derwood Kaplan, MD  diltiazem (CARDIZEM CD) 180 MG 24 hr capsule Take 1 capsule (180 mg total) by mouth every morning. 12/05/14  Yes Penny Pia, MD  dipyridamole-aspirin (AGGRENOX) 200-25 MG per 12 hr capsule Take 1 capsule by mouth 2 (two) times daily. 01/15/14  Yes Nilda Riggs, NP  HYDROcodone-acetaminophen (NORCO/VICODIN) 5-325 MG per tablet Take 1 tablet by  mouth every 6 (six) hours as needed. Patient taking differently: Take 1 tablet by mouth every 6 (six) hours as needed for moderate pain.  12/05/14  Yes Penny Pia, MD  ibuprofen (ADVIL,MOTRIN) 200 MG tablet Take 600 mg by mouth every 6 (six) hours as needed for moderate pain.   Yes Historical Provider, MD  ipratropium-albuterol (DUONEB) 0.5-2.5 (3) MG/3ML SOLN Take 3 mLs by nebulization every 4 (four) hours as needed (For shortness of breath). 01/15/13  Yes Laveda Norman, MD  lamoTRIgine (LAMICTAL) 25 MG tablet TAKE 3 TABLETS BY MOUTH TWICE  A DAY FOR 1 WEEK THEN TAKE 4 TABLETS BY MOUTH TWICE A DAY Patient taking differently: take 4 tablets by mouth twice a day. 03/17/14  Yes Nilda Riggs, NP  loratadine (CLARITIN) 10 MG tablet Take 10 mg by mouth every morning.   Yes Historical Provider, MD  metFORMIN (GLUCOPHAGE) 500 MG tablet Take 1 tablet by mouth 2 (two) times daily. 10/22/14  Yes Historical Provider, MD  omeprazole (PRILOSEC) 40 MG capsule Take 40 mg by mouth every morning.    Yes Historical Provider, MD  traMADol (ULTRAM) 50 MG tablet Take 50 mg by mouth 3 (three) times daily as needed for moderate pain.   Yes Historical Provider, MD  tamsulosin (FLOMAX) 0.4 MG CAPS capsule Take 1 capsule (0.4 mg total) by mouth daily. 06/02/2015   Barnetta Chapel, PA-C    Inpatient medications: . antiseptic oral rinse  7 mL Mouth Rinse 10 times per day  . aspirin  81 mg Per Tube Daily  . atorvastatin  80 mg Per NG tube q1800  . chlorhexidine gluconate  15 mL Mouth Rinse BID  . Chlorhexidine Gluconate Cloth  6 each Topical Q0600  . Chlorhexidine Gluconate Cloth  6 each Topical Q0600  . chlorothiazide (DIURIL) IV  500 mg Intravenous Once  . clopidogrel  75 mg Per NG tube Daily  . feeding supplement (VITAL HIGH PROTEIN)  1,000 mL Per Tube Q24H  . fentaNYL (SUBLIMAZE) injection  50 mcg Intravenous Once  . hydrocortisone sod succinate (SOLU-CORTEF) inj  100 mg Intravenous Q8H  . Influenza vac split  quadrivalent PF  0.5 mL Intramuscular Tomorrow-1000  . insulin aspart  0-20 Units Subcutaneous 6 times per day  . meropenem (MERREM) IV  1 g Intravenous Q8H  . mupirocin ointment  1 application Nasal BID  . pantoprazole sodium  40 mg Per Tube Daily  . sodium chloride  10-40 mL Intracatheter Q12H  . sodium chloride  3 mL Intravenous Q12H  . vancomycin  1,250 mg Intravenous Q24H    Discontinued Meds:   Medications Discontinued During This Encounter  Medication Reason  . ciprofloxacin (CIPRO) 500 MG tablet   . oxyCODONE (ROXICODONE) 5 MG immediate release tablet   . tamsulosin (FLOMAX) 0.4 MG CAPS capsule   . ondansetron (ZOFRAN ODT) 8 MG disintegrating tablet   . fluticasone (FLONASE) 50 MCG/ACT nasal spray   . albuterol (PROVENTIL HFA;VENTOLIN HFA) 108 (90 BASE) MCG/ACT inhaler 1-2 puff   . bupivacaine-EPINEPHrine (MARCAINE W/ EPI) 0.25% -1:200000 (with pres) injection Patient Transfer  . 0.9 % irrigation (POUR BTL) Patient Transfer  . lactated ringers irrigation solution Patient Transfer  . norepinephrine (LEVOPHED) 4 mg in dextrose 5 % 250 mL (0.016 mg/mL) infusion Dose change  . lidocaine (cardiac) IV  infusion 4 mg/mL   . heparin 1,500 mL, lidocaine (PF) (XYLOCAINE) 1 % 15 mL, nitroGLYCERIN 10 mL Patient Discharge  . iohexol (OMNIPAQUE) 350 MG/ML injection Patient Discharge  . lidocaine (cardiac) IV  infusion 4 mg/mL Patient Discharge  . phenylephrine (NEO-SYNEPHRINE) 10 mg in dextrose 5 % 250 mL (0.04 mg/mL) infusion Patient Discharge  . norepinephrine (LEVOPHED) 4 mg in dextrose 5 % 250 mL (0.016 mg/mL) infusion Patient Discharge  . tirofiban (AGGRASTAT) infusion 50 mcg/mL 100 mL Patient Discharge  . tirofiban (AGGRASTAT) bolus via infusion Patient Discharge  . bivalirudin (ANGIOMAX) 250 mg in sodium chloride 0.9 % 50 mL (5 mg/mL) infusion Patient Discharge  . bivalirudin (ANGIOMAX) BOLUS via infusion Patient Discharge  . midazolam (VERSED) injection Patient Discharge  .  fentaNYL (SUBLIMAZE) injection Patient Discharge  . midazolam (VERSED) 50 mg in sodium chloride 0.9 % 50 mL (1 mg/mL) infusion   . cefTRIAXone (ROCEPHIN) 2 g in dextrose 5 % 50 mL IVPB   . metroNIDAZOLE (FLAGYL) IVPB 500 mg   . diltiazem (CARDIZEM CD) 24 hr capsule 180 mg   . diphenhydrAMINE (BENADRYL) capsule 25 mg   . diphenhydrAMINE (BENADRYL) injection 25 mg   . ipratropium-albuterol (DUONEB) 0.5-2.5 (3) MG/3ML nebulizer solution 3 mL   . lamoTRIgine (LAMICTAL) tablet 100 mg   . oxyCODONE (Oxy IR/ROXICODONE) immediate release tablet 5-10 mg   . pantoprazole (PROTONIX) EC tablet 40 mg   . phenylephrine (NEO-SYNEPHRINE) 10 mg in dextrose 5 % 250 mL (0.04 mg/mL) infusion   . 0.9 %  sodium chloride infusion   . lactated ringers bolus 1,000 mL   . piperacillin-tazobactam (ZOSYN) IVPB 3.375 g   . lactated ringers bolus 1,000 mL   . potassium chloride 10 mEq in 100 mL IVPB   . lactated ringers infusion   . 0.9 % NaCl with KCl 20 mEq/ L  infusion   . heparin ADULT infusion 100 units/mL (25000 units/250 mL)   . lactated ringers bolus 1,000 mL   . promethazine (PHENERGAN) injection 6.25-12.5 mg   . insulin aspart (novoLOG) injection 0-9 Units   . HYDROmorphone (DILAUDID) injection 0.25-0.5 mg   . magnesium sulfate IVPB 2 g 50 mL   . heparin ADULT infusion 100 units/mL (25000 units/250 mL)   . milrinone (PRIMACOR) 20 MG/100ML (0.2 mg/mL) infusion   . DOPamine (INTROPIN) 800 mg in dextrose 5 % 250 mL (3.2 mg/mL) infusion   . phenylephrine (NEO-SYNEPHRINE) 40 mg in dextrose 5 % 250 mL (0.16 mg/mL) infusion   . sodium bicarbonate 150 mEq in sterile water 1,000 mL infusion   . pantoprazole (PROTONIX) injection 40 mg   . potassium chloride 10 mEq in 50 mL *CENTRAL LINE* IVPB   . calcium chloride 2 g in sodium chloride 0.9 % 100 mL IVPB   . 0.9 %  sodium chloride infusion     Social History:  reports that he has been smoking Cigarettes.  He has a 10 pack-year smoking history. He has never used  smokeless tobacco. He reports that he does not drink alcohol or use illicit drugs.  Family History:   Family History  Problem Relation Age of Onset  . Cancer Maternal Grandfather     prostate  . Diabetes Mother   . Heart Problems Father     Review of systems not obtained due to patient factors. Weight change: 4.4 kg (9 lb 11.2 oz)  Intake/Output Summary (Last 24 hours) at 06/01/15 2259 Last data filed at 06/01/15 2200  Gross per 24 hour  Intake 7961.64 ml  Output   2905 ml  Net 5056.64 ml   BP 104/55 mmHg  Pulse 86  Temp(Src) 98.1 F (36.7 C) (Core (Comment))  Resp 26  Ht 5\' 10"  (1.778 m)  Wt 160 kg (352 lb 11.8 oz)  BMI 50.61 kg/m2  SpO2 90% Filed Vitals:   06/01/15 2100 06/01/15 2130 06/01/15 2145 06/01/15 2200  BP: 91/49   104/55  Pulse:      Temp: 98.1 F (36.7 C) 98.1 F (36.7 C) 98.1 F (36.7 C) 98.1 F (36.7 C)  TempSrc:      Resp:      Height:      Weight:      SpO2: 85% 92% 91% 90%     General  appearance: Intubated and sedated, morbidly obese Head: Normocephalic, without obvious abnormality, atraumatic Resp: rhonchi bilaterally Cardio: faint HS, no rub, regular rhythm GI: obese, distended/tense, absent bowel sounds Extremities: edema 2+ anasarca to abdomen Neurologic: Mental status: sedated and unresponsive  Labs: Basic Metabolic Panel:  Recent Labs Lab Jun 01, 2015 0445 2015/06/01 1852  05/30/15 1715 05/31/15 0145 05/31/15 0900 05/31/15 1715 06/01/15 0125 06/01/15 1122 06/01/15 1836  NA 138 137  < > 129* 127* 127* 124* 125* 126* 124*  K 3.4* 3.1*  < > 3.0* 2.9* 4.4 2.8* 3.2* 3.3* 3.8  CL 101 107  < > 91* 87* 85* 83* 87* 84* 83*  CO2 30 24  < > 28 29 31  34* 31 31 31   GLUCOSE 130* 132*  < > 192* 179* 186* 171* 160* 145* 135*  BUN 20 19  < > 31* 34* 35* 34* 39* 41* 45*  CREATININE 1.04 1.51*  < > 1.86* 1.83* 1.82* 1.81* 1.79* 1.67* 1.85*  ALBUMIN 4.1 3.0*  --   --   --   --   --   --   --   --   CALCIUM 9.1 11.1*  < > 6.3* 6.1* 6.3* 6.1*  5.9* 6.0* 6.1*  < > = values in this interval not displayed. Liver Function Tests:  Recent Labs Lab 06-01-2015 0445 06-01-15 1852  AST 20 25  ALT 17 16*  ALKPHOS 86 60  BILITOT 0.7 1.3*  PROT 7.1 5.5*  ALBUMIN 4.1 3.0*    Recent Labs Lab 2015-06-01 0445  LIPASE 21   No results for input(s): AMMONIA in the last 168 hours. CBC:  Recent Labs Lab 06-01-2015 1852 05/29/15 0445 05/29/15 0502 05/30/15 0315 05/31/15 0145 06/01/15 0125  WBC 17.1* 21.4*  --  18.8* 17.9* 19.4*  NEUTROABS 15.4*  --   --   --   --   --   HGB 13.4 13.5 15.0 12.9* 12.1* 11.3*  HCT 40.8 42.2 44.0 38.6* 34.5* 32.2*  MCV 91.7 91.5  --  88.7 83.9 82.6  PLT 209 189  --  220 195 190   PT/INR: @LABRCNTIP (inr:5) Cardiac Enzymes: ) Recent Labs Lab 06-01-15 1852 06/01/2015 1853 June 01, 2015 2309 05/29/15 0445 05/29/15 1000  CKTOTAL  --  49  --   --   --   CKMB  --  8.9*  --   --   --   TROPONINI 0.65*  --  >65.00* >65.00* >65.00*   CBG:  Recent Labs Lab 06/01/15 0418 06/01/15 0727 06/01/15 1126 06/01/15 1629 06/01/15 1932  GLUCAP 157* 153* 138* 128* 131*    Iron Studies: No results for input(s): IRON, TIBC, TRANSFERRIN, FERRITIN in the last 168 hours.  Xrays/Other Studies: Dg Chest Port 1 View  06/01/2015  CLINICAL DATA:  Pulmonary edema . EXAM: PORTABLE CHEST 1 VIEW COMPARISON:  05/31/2015. FINDINGS: Endotracheal tube, NG tube, left IJ catheter, Swan-Ganz catheter, aortic balloon pump new in unchanged position . Cardiomegaly with bilateral pulmonary infiltrates consistent pulmonary edema. Bilateral pleural effusions. No interim change. No pneumothorax . IMPRESSION: 1. Lines and tubes in unchanged position. 2. Cardiomegaly with with diffuse bilateral pulmonary infiltrates consistent with pulmonary edema. Bilateral small effusions. No interim change from prior exam. Electronically Signed   By: Maisie Fus  Register   On: 06/01/2015 07:19   Dg Chest Port 1 View  05/31/2015  CLINICAL DATA:  Acute  respiratory failure EXAM: PORTABLE CHEST 1 VIEW COMPARISON:  Yesterday FINDINGS: Endotracheal tube tip in good position at the clavicular heads. The aortic balloon  pump tip is just below the level of the aortic arch. Swan-Ganz catheter, tip directed towards the inner lobar pulmonary artery on the right. Left IJ central line, tip directed towards the SVC origin. Unchanged hazy appearance of the chest compatible the atelectasis and pleural fluid. Pulmonary venous congestion with cephalized blood flow. No pneumothorax. Stable normal heart size. IMPRESSION: 1. Stable positioning of tubes and lines. 2. Unchanged edema, effusions, and atelectasis. Electronically Signed   By: Marnee Spring M.D.   On: 05/31/2015 07:17   CLINICAL DATA: Right lower quadrant pain. Leukocytosis.  EXAM: CT ABDOMEN AND PELVIS WITH CONTRAST  TECHNIQUE: Multidetector CT imaging of the abdomen and pelvis was performed using the standard protocol following bolus administration of intravenous contrast.  CONTRAST: OMNIPAQUE IOHEXOL 300 MG/ML SOLN  COMPARISON: 12/03/2014  FINDINGS: There is enlargement and inflammation of the appendix with features typical of acute appendicitis. There is periappendiceal fluid, concerning for rupture but no frank extraluminal air.  There is a 6 x 9 mm right ureteral calculus at the L5 level with marked hydronephrosis. There is not a significant degree of periureteral and perinephric stranding, implying that this is not an acute ureteral obstruction. However, the ureteral calculus is clearly new compared to prior CT of 12/03/2014. There are multiple collecting system calculi in the left kidney measuring up to 3 mm.  There are unremarkable appearances of the liver, gallbladder, bile ducts, pancreas, spleen and adrenals. Stomach and small bowel are unremarkable. The abdominal aorta is normal in caliber. There is mild atherosclerotic calcification. There is no adenopathy in  the abdomen or pelvis.  There is no significant abnormality in the lower chest. No significant musculoskeletal lesion. Lower lumbar facet arthropathy is present from L4 through S1.  IMPRESSION: 1. Acute appendicitis. 2. 6 x 9 mm right ureteral calculus at the L5 level with marked hydronephrosis. This is recent but probably not acute. 3. Bilateral nephrolithiasis These results were called by telephone at the time of interpretation on 05/30/2015 at 6:47 am to Dr. Ross Marcus , who verbally acknowledged these results.   Electronically Signed  By: Ellery Plunk M.D.  On: 06/17/2015 06:48  Assessment/Plan: 1.  ARF, non-oliguric in setting of cardiogenic shock/sepsis and IV contrast as well as bilateral hydronephrosis and obstructing stone in right ureter.  He is requiring pressors and despite being non-oliguric he is unable to keep up with the volume administered due to pressors/meds and is markedly volume overloaded.  Discussed case with Dr. Gala Romney. 1. In light of his hypoxemia and pulmonary edema, will initiate CVVHDF for volume removal in hopes that this will help oxygenation as well as cardiac function.  I discussed this with his sister Scott Holden who is obviously concerned but is in agreement to try to help with his oxygenation and volume status.  She understands that this is only helping with one of many issues her brother is suffering from and will not fix his other underlying issues. 2. Appreciate Dr. Prescott Gum assistance with dialysis access 2. Acute inferior STEMI with RV involvement-  s/p PTCA and DES placement in RCA complicated by cardiogenic shock requiring an IABP and pressors. 3. Acute appendicitis with gangrenous changes but unable to remove due to his cardiac arrest and critical illness.  Continue with antibiotics and pressors 4. VDRF with increasing oxygen requirements and marked volume overload.  On 100% FIO2 and will try CVVHDF as above to help with  oxygenation. 5. SIRS/multi-organ failure- cardiogenic and sepsis. On pressors, antibiotics, and IABP 6. Acute systolic  heart failure with cardiogenic shock and biventricular failure.  Inadequate response from lasix gtt and metolazone/diuril. Plan for CVVHDF as above 7. Recurrent VT- per Cardiology 8. Hypocalcemia- will treat with CVVHD and follow 9. HEME- on heparin gtt as well as angiomax 10. Nephrolithiasis with obstructive uropathy- too unstable for any procedure at this time.  Continue with supportive measures 11. Disposition- overall prognosis is grim due to multiple issues which cannot be addressed due to his critical condition.  Patient's pastor is with his family, however his sister would like to continue with aggressive therapy for now.  This may need to be addressed if his condition continues to deteriorate.   Scott Holden A 06/01/2015, 10:59 PM

## 2015-06-01 NOTE — Progress Notes (Signed)
CRITICAL VALUE ALERT  Critical value received:  Calcium 5.9  Date of notification:  06/01/15  Time of notification:  0207  Critical value read back:Yes.    Nurse who received alert:  Lexi  MD notified (1st page):  Dr. Kendrick Fries  Time of first page:  0208  MD notified (2nd page):  Time of second page:  Responding MD:  Dr. Kendrick Fries  Time MD responded:  [phone]

## 2015-06-01 NOTE — Progress Notes (Signed)
Per Verbal order by Dr Isaiah Serge, decrease VT to 6cc/kg. ABG 30 minutes after change on ventilator.

## 2015-06-01 NOTE — Progress Notes (Signed)
PULMONARY / CRITICAL CARE MEDICINE   Name: Scott Holden MRN: [id number] DOB: 03/25/1956    ADMISSION DATE:  06/11/2015 CONSULTATION DATE:  06/03/2015   REFERRING MD :  Dr. Clifton James  CHIEF COMPLAINT:  Cardiogenic and septic shock  INITIAL PRESENTATION:  59M presented to [facility] with several days of progressively worsening RLQ pain found to have an acute appendicitis.  He went to the OR and suffered a VT arrest on the table after the insertion of the trochar.  He was transferred to [facility] after a code STEMI was called and the surgical intervention was aborted.  In the cath lab an RCA DES was placed and an IABP inserted for MI and cardiogenic shock.  While in cath lab, he required shock x6 for VF arrest and post return to ICU, shock x5.     SUBJECTIVE: Continues to be on multiple pressors, lasix and inotropes. He remains intubated, sedated. I/O+ in spite of Lasix drip  VITAL SIGNS: Temp:  [97.9 F (36.6 C)-98.4 F (36.9 C)] 97.9 F (36.6 C) (12/06 1100) Pulse Rate:  [78-81] 79 (12/06 0725) Resp:  [26-30] 26 (12/06 0725) BP: (82-109)/(58-75) 99/65 mmHg (12/06 1100) SpO2:  [78 %-100 %] 100 % (12/06 1100) Arterial Line BP: (80-108)/(51-73) 93/51 mmHg (12/06 1100) FiO2 (%):  [90 %-100 %] 100 % (12/06 0725) Weight:  [352 lb 11.8 oz (160 kg)] 352 lb 11.8 oz (160 kg) (12/06 0400)   HEMODYNAMICS: PAP: (27-37)/(23-32) 30/24 mmHg CVP:  [13 mmHg-19 mmHg] 13 mmHg PCWP:  [21 mmHg] 21 mmHg CO:  [6.9 L/min] 6.9 L/min CI:  [2.7 L/min/m2] 2.7 L/min/m2   VENTILATOR SETTINGS: Vent Mode:  [-] PRVC FiO2 (%):  [90 %-100 %] 100 % Set Rate:  [20 bmp-30 bmp] 20 bmp Vt Set:  [590 mL] 590 mL PEEP:  [12 cmH20] 12 cmH20 Plateau Pressure:  [20 cmH20-28 cmH20] 25 cmH20   INTAKE / OUTPUT:  Intake/Output Summary (Last 24 hours) at 06/01/15 1126 Last data filed at 06/01/15 1100  Gross per 24 hour  Intake 8670.85 ml  Output   4615 ml  Net 4055.85 ml    PHYSICAL EXAMINATION: General:  Obese male,  critically ill Neuro:  Sedated, unresponsive HEENT:  ETT tube in place, moist mucous membranes Cardiovascular:  Regular rate and rhythm  Lungs:  Bilateral rhonchi, nonlabored Abdomen:  Soft, tender Musculoskeletal:   Normal tone, no acute deformities  Skin:  Generalized edema  LABS:  CBC  Recent Labs Lab 05/30/15 0315 05/31/15 0145 06/01/15 0125  WBC 18.8* 17.9* 19.4*  HGB 12.9* 12.1* 11.3*  HCT 38.6* 34.5* 32.2*  PLT 220 195 190   Coag's No results for input(s): APTT, INR in the last 168 hours.   BMET  Recent Labs Lab 05/31/15 0900 05/31/15 1715 06/01/15 0125  NA 127* 124* 125*  K 4.4 2.8* 3.2*  CL 85* 83* 87*  CO2 31 34* 31  BUN 35* 34* 39*  CREATININE 1.82* 1.81* 1.79*  GLUCOSE 186* 171* 160*   Electrolytes  Recent Labs Lab 05/30/15 0534  05/31/15 0145 05/31/15 0900 05/31/15 1715 06/01/15 0125  CALCIUM  --   < > 6.1* 6.3* 6.1* 5.9*  MG 1.8  --  1.8  --  1.8  --   < > = values in this interval not displayed. Sepsis Markers  Recent Labs Lab 05/29/15 0314 05/29/15 0457 05/29/15 1456  LATICACIDVEN 6.95* 6.9* 3.9*   ABG  Recent Labs Lab 05/31/15 1055 05/31/15 1629 06/01/15 0322  PHART 7.596* 7.629* 7.597*  PCO2ART 33.7* 31.0* 32.4*  PO2ART 57.7* 60.0* 76.0*   Liver Enzymes  Recent Labs Lab 06/11/2015 0445 06/18/2015 1852  AST 20 25  ALT 17 16*  ALKPHOS 86 60  BILITOT 0.7 1.3*  ALBUMIN 4.1 3.0*   Cardiac Enzymes  Recent Labs Lab 06/19/2015 2309 05/29/15 0445 05/29/15 1000  TROPONINI >65.00* >65.00* >65.00*   Glucose  Recent Labs Lab 05/31/15 1150 05/31/15 1629 05/31/15 1937 05/31/15 2337 06/01/15 0418 06/01/15 0727  GLUCAP 158* 151* 155* 155* 157* 153*    Imaging Dg Chest Port 1 View  06/01/2015  CLINICAL DATA:  Pulmonary edema . EXAM: PORTABLE CHEST 1 VIEW COMPARISON:  05/31/2015. FINDINGS: Endotracheal tube, NG tube, left IJ catheter, Swan-Ganz catheter, aortic balloon pump new in unchanged position . Cardiomegaly  with bilateral pulmonary infiltrates consistent pulmonary edema. Bilateral pleural effusions. No interim change. No pneumothorax . IMPRESSION: 1. Lines and tubes in unchanged position. 2. Cardiomegaly with with diffuse bilateral pulmonary infiltrates consistent with pulmonary edema. Bilateral small effusions. No interim change from prior exam. Electronically Signed   By: Maisie Fus  Register   On: 06/01/2015 07:19     ASSESSMENT / PLAN: 59 y/o male with multiorgan dysfunction syndrome due to mixed cardiogenic shock from intra-operative MI complicated by septic shock from appendicitis without source control.  His prognosis is overall very poor as he is on supra-maximal doses multiple vaso-pressor agents, IABP and maximum ventilator support for has combined metabolic and respiratory acidosis.  PULMONARY A:  Hypoxemic and hypercapnic respiratory failure - despite max vent support. P:   Wean PEEP / FiO2 for sats > 92% Respiratory alkalosis noted. Reduce rate to 20 and tidal volume to 6 cc/KG. Recheck ABG Trend CXR   CARDIOVASCULAR L FEM IABP 12/2 >>  L IJ TLC 12/2 >>  A:  VF Arrest - intraoperatively, s/p LHC with DES to RCA Mixed cardiogenic shock - in the setting of inferior intra-operative MI and septic shock.  IABP 1:1.  ScVO2 68 Torsades / Monomorphic VT, Frequent PVC's - suspect in setting of ischemia  Afib RV Infarct  Swan ganz cath P:  Continue heparin gtt and dual anti-platelet therapy. IABP 1:1. May be DC'd tomorrow Continue Amiodarone, Lido Milrinone for ionotope Continue dopamine, levophed and vasopressin  ASA  Stress dose steroids  Cardiology Following    RENAL A:   AGMA/NAGMA: 2/2 oliguric renal failure and lactic acidemia. Hyponatremia  Acute Kidney Injury - in setting of arrest / hypoperfusion  P:   Vent support as above Off bicarbonate gtt. secondary to alkalosis Lasix drip for diuresis. Added metolazone Monitor UOP / BMP  Replace electrolytes as indicated   Trend lactic acid  Lasix drip   GASTROINTESTINAL A:   Appendicitis - unable to complete surgery for source control.  No evidence of peritonitis or rupture. P:   NPO  PPI  No plan for repeat OR per surgery given his critical condition  HEMATOLOGIC A:   Dual Antiplatelet therapy and heparin gtt. P:  Aggrenox / Plavix Heparin gtt per pharmacy   INFECTIOUS A:   Septic shock - secondary to appendicitis without source control. P:   BCx2 12/2 >>  ABX: Vanc 12/2 >>  Meropenem 12/3 >>  Monitor fever curve / WBC Consider anti-fungal if further decompensation   ENDOCRINE A:   Relative adrenal insufficiency.   P:   Hydrocortisone 100mg  IV Q8hours SSI to resistant scale, CBG Q4  NEUROLOGIC A:   ICU Associated Pain  P:   RASS goal: -2 with vent  compliance Fentanyl gtt for pain Versed for sedation   FAMILY  - Updates:  No family at bedside.  Continue all current measures.  NO CPR in the event of arrest.  Shock / drugs ok.   Critical care time-35 minutes.  Chilton Greathouse MD [facility] Pager [phone] If no answer or after 3pm call: [phone] 06/01/2015, 11:26 AM

## 2015-06-01 NOTE — Progress Notes (Signed)
Nutrition Follow-up  DOCUMENTATION CODES:   Obesity unspecified  INTERVENTION:   Start Trickle feedings with Vital High Protein @ 20 ml/hr, no advancement for now per surgery.   NUTRITION DIAGNOSIS:   Inadequate oral intake related to inability to eat as evidenced by NPO status. Ongoing.   GOAL:   Provide needs based on ASPEN/SCCM guidelines Not yet met.   MONITOR:   Vent status, Labs, Weight trends, TF tolerance, I & O's  REASON FOR ASSESSMENT:   Consult Enteral/tube feeding initiation and management  ASSESSMENT:   84M presented to [facility] with several days of progressively worsening RLQ pain found to have an acute appendicitis. He went to the OR and suffered a VT arrest on the table after the insertion of the trochar. He was transferred to [facility] after a code STEMI was called and the surgical intervention was aborted. In the cath lab an RCA DES was placed and an IABP inserted for MI and cardiogenic shock. While in cath lab, he required shock x6 for VF arrest and post return to ICU, shock x5.   Patient is currently intubated on ventilator support MV: 15.3 L/min Temp (24hrs), Avg:98.1 F (36.7 C), Min:97.9 F (36.6 C), Max:98.4 F (36.9 C)  Per discussion with RN pt remains on 3 pressors at a stable rate, however pt does not tolerate decrease in pressors. Pt with balloon pump and is unable to have HOB > 30 degrees. Per RN will increase to just below 30 degreesas long as he tolerates this, currently at 20 degrees.   Labs reviewed: sodium low (128), potassium low (3.3) OG tube in place  Diet Order:  Diet NPO time specified  Skin:  Reviewed, no issues  Last BM:  12/1  Height:   Ht Readings from Last 1 Encounters:  06/03/2015 5' 10"  (1.778 m)   Weight:   Wt Readings from Last 1 Encounters:  06/01/15 352 lb 11.8 oz (160 kg)   Ideal Body Weight:  75.5 kg  BMI:  Body mass index is 50.61 kg/(m^2).  Estimated Nutritional Needs:   Kcal:  2060-1561  Protein:   >151 grams  Fluid:  1.6-1.8 L  EDUCATION NEEDS:   Education needs no appropriate at this time  [place], [place], [place] Pager [phone] After Hours Pager

## 2015-06-01 NOTE — Consult Note (Signed)
ANTICOAGULATION CONSULT NOTE - Follow Up Consult  Pharmacy Consult for Heparin Indication: IABP  No Known Allergies  Patient Measurements: Height: 5\' 10"  (177.8 cm) Weight: (!) 352 lb 11.8 oz (160 kg) IBW/kg (Calculated) : 73 Heparin Dosing Weight: ~99.5kg  Vital Signs: Temp: 97.9 F (36.6 C) (12/06 0700) Temp Source: Core (Comment) (12/06 0400) BP: 106/63 mmHg (12/06 0725) Pulse Rate: 79 (12/06 0725)  Labs:  Recent Labs  05/29/15 1000  05/30/15 0315  05/30/15 2055 05/31/15 0145 05/31/15 0450 05/31/15 0900 05/31/15 1715 06/01/15 0125 06/01/15 0420  HGB  --   < > 12.9*  --   --  12.1*  --   --   --  11.3*  --   HCT  --   --  38.6*  --   --  34.5*  --   --   --  32.2*  --   PLT  --   --  220  --   --  195  --   --   --  190  --   HEPARINUNFRC 0.11*  < >  --   < > 0.24*  --  0.33  --   --   --  0.54  CREATININE  --   < >  --   < >  --  1.83*  --  1.82* 1.81* 1.79*  --   TROPONINI >65.00*  --   --   --   --   --   --   --   --   --   --   < > = values in this interval not displayed.  Estimated Creatinine Clearance: 67.8 mL/min (by C-G formula based on Cr of 1.79).   Medical History: Past Medical History  Diagnosis Date  . Kidney calculi   . Hypertension   . History of TIAs   . Arthritis   . Back pain     trouble with lumbar 2, 3, and 4 - degenerative disease per pt  . Unspecified transient cerebral ischemia   . HA (headache)   . Syncope and collapse   . BPH (benign prostatic hyperplasia)   . GERD (gastroesophageal reflux disease)   . Diabetes mellitus without complication (HCC)   . Seizure (HCC)   . BPH (benign prostatic hyperplasia)   . Sarcoidosis of lung (HCC)   . Tobacco abuse    Assessment: 58yom presented on 06/06/2015 to [facility] with acute appendicitis and was taken to the OR for a laparoscopic appendectomy. In the OR he went into cardiac arrest. EKG showed CHB and ST elevations- Code STEMI was called. He was transferred to [facility] and taken  emergently to the cath lab where he received a BMS to his RCA, temporary pacemaker and IABP were placed. He was started on heparin. He had also been continued on aggrastat x 18 hours stopped.  Heparin level 0.54 slightly above goal heparin drip 2500 uts/hr Heparin is infusing peripherally, no interruptions in gtt and no infiltration noted. H/H has trended down but stable, plt remain wnl and stable. Having some pink-red tinged urine but no other significant s/s bleeding. Will continue to monitor closely.    Goal of Therapy:  Heparin level 0.2-0.5 units/ml Monitor platelets by anticoagulation protocol: Yes   Plan:  1) Decrease heparin to 2400 units/hr  2) Daily heparin level and CBC    Leota Sauers Pharm.D. CPP, BCPS Clinical Pharmacist [phone] 06/01/2015 8:12 AM

## 2015-06-01 NOTE — Progress Notes (Signed)
Dr. Gala Romney called to check on patient, orders to give 5mg  metolazone.

## 2015-06-01 NOTE — Progress Notes (Signed)
Bensimhon called checking on patient, patient back in a fib, orders to give 150 bolus amio and increase drip to 60, also increase lasix drip to 30, will continue to monitor closely.

## 2015-06-01 NOTE — Progress Notes (Signed)
eLink Physician-Brief Progress Note Patient Name: Scott Holden DOB: 02-23-1956 MRN: [id number]   Date of Service  06/01/2015  HPI/Events of Note  Hypokalemia Hypocalcemia Very likely has hypomagnesemia, but not checked  eICU Interventions  Replete K, Ca, Mg     Intervention Category Intermediate Interventions: Electrolyte abnormality - evaluation and management  Max Fickle 06/01/2015, 2:10 AM

## 2015-06-01 NOTE — Progress Notes (Signed)
Patient ID: Scott Holden, male   DOB: 08-Jul-1955, 59 y.o.   MRN: [id number] 4 Days Post-Op  Subjective: Per RN patient seems to be weaning off some of his pressors and did not get shocked last night.  Patient denies abdominal pain.  Likely plan to DC balloon pump tomorrow if continues to improve  Objective: Vital signs in last 24 hours: Temp:  [97.7 F (36.5 C)-98.4 F (36.9 C)] 97.9 F (36.6 C) (12/06 0700) Pulse Rate:  [78-81] 79 (12/06 0725) Resp:  [26-30] 26 (12/06 0725) BP: (99-141)/(60-88) 106/63 mmHg (12/06 0725) SpO2:  [88 %-96 %] 94 % (12/06 0725) Arterial Line BP: (83-137)/(57-87) 101/61 mmHg (12/06 0700) FiO2 (%):  [80 %-100 %] 100 % (12/06 0725) Weight:  [160 kg (352 lb 11.8 oz)] 160 kg (352 lb 11.8 oz) (12/06 0400) Last BM Date: 05/27/15  Intake/Output from previous day: 12/05 0701 - 12/06 0700 In: 7042.2 [I.V.:5772.2; IV Piggyback:1270] Out: 4610 [Urine:4310; Emesis/NG output:300] Intake/Output this shift:    PE: Abd: soft, obese, shakes head no to abdominal pain with palpation, all incisions are c/d/i with staples. Some BS  Lab Results:   Recent Labs  05/31/15 0145 06/01/15 0125  WBC 17.9* 19.4*  HGB 12.1* 11.3*  HCT 34.5* 32.2*  PLT 195 190   BMET  Recent Labs  05/31/15 1715 06/01/15 0125  NA 124* 125*  K 2.8* 3.2*  CL 83* 87*  CO2 34* 31  GLUCOSE 171* 160*  BUN 34* 39*  CREATININE 1.81* 1.79*  CALCIUM 6.1* 5.9*   PT/INR No results for input(s): LABPROT, INR in the last 72 hours. CMP     Component Value Date/Time   NA 125* 06/01/2015 0125   NA 140 01/15/2014 0910   K 3.2* 06/01/2015 0125   CL 87* 06/01/2015 0125   CO2 31 06/01/2015 0125   GLUCOSE 160* 06/01/2015 0125   GLUCOSE 121* 01/15/2014 0910   BUN 39* 06/01/2015 0125   BUN 9 01/15/2014 0910   CREATININE 1.79* 06/01/2015 0125   CALCIUM 5.9* 06/01/2015 0125   PROT 5.5* Jun 24, 2015 1852   PROT 6.8 01/15/2014 0910   ALBUMIN 3.0* 24-Jun-2015 1852   ALBUMIN 4.4 01/15/2014 0910    AST 25 June 24, 2015 1852   ALT 16* 06/24/2015 1852   ALKPHOS 60 06-24-15 1852   BILITOT 1.3* 2015/06/24 1852   GFRNONAA 40* 06/01/2015 0125   GFRAA 46* 06/01/2015 0125   Lipase     Component Value Date/Time   LIPASE 21 June 24, 2015 0445       Studies/Results: Dg Chest Port 1 View  06/01/2015  CLINICAL DATA:  Pulmonary edema . EXAM: PORTABLE CHEST 1 VIEW COMPARISON:  05/31/2015. FINDINGS: Endotracheal tube, NG tube, left IJ catheter, Swan-Ganz catheter, aortic balloon pump new in unchanged position . Cardiomegaly with bilateral pulmonary infiltrates consistent pulmonary edema. Bilateral pleural effusions. No interim change. No pneumothorax . IMPRESSION: 1. Lines and tubes in unchanged position. 2. Cardiomegaly with with diffuse bilateral pulmonary infiltrates consistent with pulmonary edema. Bilateral small effusions. No interim change from prior exam. Electronically Signed   By: Maisie Fus  Register   On: 06/01/2015 07:19   Dg Chest Port 1 View  05/31/2015  CLINICAL DATA:  Acute respiratory failure EXAM: PORTABLE CHEST 1 VIEW COMPARISON:  Yesterday FINDINGS: Endotracheal tube tip in good position at the clavicular heads. The aortic balloon pump tip is just below the level of the aortic arch. Swan-Ganz catheter, tip directed towards the inner lobar pulmonary artery on the right. Left IJ central line,  tip directed towards the SVC origin. Unchanged hazy appearance of the chest compatible the atelectasis and pleural fluid. Pulmonary venous congestion with cephalized blood flow. No pneumothorax. Stable normal heart size. IMPRESSION: 1. Stable positioning of tubes and lines. 2. Unchanged edema, effusions, and atelectasis. Electronically Signed   By: Marnee Spring M.D.   On: 05/31/2015 07:17   Dg Chest Port 1 View  05/30/2015  CLINICAL DATA:  Central line placed EXAM: PORTABLE CHEST 1 VIEW COMPARISON:  Chest radiograph from earlier today. FINDINGS: Endotracheal tube tip is 5.3 cm above the carina. Left  internal jugular central venous catheter terminates over the left brachiocephalic vein. Enteric tube enters the lower thoracic esophagus, with the tip below the inferior margin of this image. Right internal jugular Swan-Ganz catheter terminates in the bronchus intermedius branch of the right pulmonary artery. Intra-aortic balloon pump overlies the proximal descending thoracic aorta at the T5 level. Stable cardiomediastinal silhouette with mild cardiomegaly. No pneumothorax. Worsening small bilateral pleural effusions. Mild to moderate pulmonary edema, worsened. Bibasilar atelectasis, worsened. IMPRESSION: 1. Right internal jugular Swan-Ganz catheter terminates in the bronchus intermedius branch of the right pulmonary artery, recommend 5 cm retraction. 2. No pneumothorax. 3. Mild-to-moderate congestive heart failure, worsened. 4. Small bilateral pleural effusions, increased bilaterally. 5. Worsening bibasilar atelectasis. Electronically Signed   By: Delbert Phenix M.D.   On: 05/30/2015 13:16   Dg Chest Port 1 View  05/30/2015  CLINICAL DATA:  Acute respiratory failure. History of sarcoidosis and diabetes. EXAM: PORTABLE CHEST 1 VIEW COMPARISON:  05/29/2015 and 12/02/2014. FINDINGS: 1037 hours. The endotracheal tube, nasogastric tube and left IJ central venous catheter appear unchanged. External pacers overlie the left chest. The heart size and mediastinal contours are stable from yesterday. There are lower lung volumes with increased vascular congestion and increased patchy opacities at both lung bases. There may be small bilateral pleural effusions. No consolidation or pneumothorax identified. IMPRESSION: Worsening pulmonary aeration with increased bibasilar airspace opacities and possible pleural effusions. Widening of the superior mediastinum is unchanged from yesterday, likely at least partly due to lower lung volumes and AP technique. Electronically Signed   By: Carey Bullocks M.D.   On: 05/30/2015 11:33     Anti-infectives: Anti-infectives    Start     Dose/Rate Route Frequency Ordered Stop   05/30/15 0600  vancomycin (VANCOCIN) 1,250 mg in sodium chloride 0.9 % 250 mL IVPB     1,250 mg 166.7 mL/hr over 90 Minutes Intravenous Every 24 hours 05/29/15 0237     05/29/15 1200  meropenem (MERREM) 1 g in sodium chloride 0.9 % 100 mL IVPB     1 g 200 mL/hr over 30 Minutes Intravenous Every 8 hours 05/29/15 0237     05/29/15 0600  piperacillin-tazobactam (ZOSYN) IVPB 3.375 g  Status:  Discontinued     3.375 g 12.5 mL/hr over 240 Minutes Intravenous 3 times per day 06/03/2015 2256 05/29/15 0216   05/29/15 0245  vancomycin (VANCOCIN) 2,500 mg in sodium chloride 0.9 % 500 mL IVPB     2,500 mg 250 mL/hr over 120 Minutes Intravenous  Once 05/29/15 0237 05/29/15 0612   05/29/15 0245  meropenem (MERREM) 1 g in sodium chloride 0.9 % 100 mL IVPB     1 g 200 mL/hr over 30 Minutes Intravenous  Once 05/29/15 0237 05/29/15 0339   06/01/2015 2315  piperacillin-tazobactam (ZOSYN) IVPB 3.375 g     3.375 g 100 mL/hr over 30 Minutes Intravenous  Once 06/03/2015 2300 05/29/15 0124   06/14/2015  0830  cefTRIAXone (ROCEPHIN) 2 g in dextrose 5 % 50 mL IVPB  Status:  Discontinued     2 g 100 mL/hr over 30 Minutes Intravenous Every 24 hours 06/06/2015 0815 06/03/2015 2256   06/15/2015 0830  metroNIDAZOLE (FLAGYL) IVPB 500 mg  Status:  Discontinued     500 mg 100 mL/hr over 60 Minutes Intravenous Every 8 hours 06/03/2015 0815 06/18/2015 2256   06/16/2015 0715  piperacillin-tazobactam (ZOSYN) IVPB 3.375 g     3.375 g 12.5 mL/hr over 240 Minutes Intravenous  Once 06/04/2015 0700 06/13/2015 0907       Assessment/Plan   1. POD 4, s/p aborted lap appy due to VF arrest, still with acute appendicitis -cont IV abx therapy -denies abdominal pain, will try to start trickle TFs down NGT today 2. Obstructing right ureteral stone with hydronephrosis -urology saw at [facility] and didn't feel as if intervention needed at this time as his creatinine  was normal. Cr up now, but more likely secondary to shock than stone, but can follow 3. VF arrest with multiple cardiac issues -defer to cardiology and CCM.  LOS: 4 days    Dymond Gutt E 06/01/2015, 7:34 AM Pager: [phone]

## 2015-06-01 NOTE — Progress Notes (Signed)
Pt oxygen sats reading 83%, this RN changed locations of probe three different times, called RT to bedside, called the box, orders to obtain ABG, ABG results given to Dr. Molli Knock, new orders received. Will continue to monitor closely.

## 2015-06-02 DIAGNOSIS — N178 Other acute kidney failure: Secondary | ICD-10-CM

## 2015-06-02 LAB — BLOOD GAS, ARTERIAL
ACID-BASE EXCESS: 4.7 mmol/L — AB (ref 0.0–2.0)
Bicarbonate: 29.8 mEq/L — ABNORMAL HIGH (ref 20.0–24.0)
DRAWN BY: 418751
FIO2: 100
LHR: 20 {breaths}/min
O2 Saturation: 94.8 %
PEEP: 18 cmH2O
PH ART: 7.362 (ref 7.350–7.450)
Patient temperature: 98.6
Pressure control: 20 cmH2O
TCO2: 31.4 mmol/L (ref 0–100)
pCO2 arterial: 53.8 mmHg — ABNORMAL HIGH (ref 35.0–45.0)
pO2, Arterial: 81.7 mmHg (ref 80.0–100.0)

## 2015-06-02 LAB — BASIC METABOLIC PANEL
ANION GAP: 9 (ref 5–15)
BUN: 45 mg/dL — ABNORMAL HIGH (ref 6–20)
CALCIUM: 6.2 mg/dL — AB (ref 8.9–10.3)
CHLORIDE: 86 mmol/L — AB (ref 101–111)
CO2: 31 mmol/L (ref 22–32)
Creatinine, Ser: 1.73 mg/dL — ABNORMAL HIGH (ref 0.61–1.24)
GFR calc non Af Amer: 41 mL/min — ABNORMAL LOW (ref >60)
GFR, EST AFRICAN AMERICAN: 48 mL/min — AB (ref >60)
GLUCOSE: 162 mg/dL — AB (ref 65–99)
POTASSIUM: 4 mmol/L (ref 3.5–5.1)
Sodium: 126 mmol/L — ABNORMAL LOW (ref 135–145)

## 2015-06-02 LAB — RENAL FUNCTION PANEL
ALBUMIN: 1.8 g/dL — AB (ref 3.5–5.0)
ANION GAP: 9 (ref 5–15)
ANION GAP: 10 (ref 5–15)
Albumin: 1.7 g/dL — ABNORMAL LOW (ref 3.5–5.0)
BUN: 45 mg/dL — ABNORMAL HIGH (ref 6–20)
BUN: 40 mg/dL — ABNORMAL HIGH (ref 6–20)
CALCIUM: 6.2 mg/dL — AB (ref 8.9–10.3)
CHLORIDE: 86 mmol/L — AB (ref 101–111)
CHLORIDE: 90 mmol/L — AB (ref 101–111)
CO2: 31 mmol/L (ref 22–32)
CO2: 29 mmol/L (ref 22–32)
Calcium: 6.4 mg/dL — CL (ref 8.9–10.3)
Creatinine, Ser: 1.74 mg/dL — ABNORMAL HIGH (ref 0.61–1.24)
Creatinine, Ser: 1.47 mg/dL — ABNORMAL HIGH (ref 0.61–1.24)
GFR calc Af Amer: 48 mL/min — ABNORMAL LOW (ref >60)
GFR calc non Af Amer: 41 mL/min — ABNORMAL LOW (ref >60)
GFR, EST AFRICAN AMERICAN: 59 mL/min — AB (ref >60)
GFR, EST NON AFRICAN AMERICAN: 50 mL/min — AB (ref >60)
GLUCOSE: 160 mg/dL — AB (ref 65–99)
Glucose, Bld: 154 mg/dL — ABNORMAL HIGH (ref 65–99)
PHOSPHORUS: 4.4 mg/dL (ref 2.5–4.6)
POTASSIUM: 4 mmol/L (ref 3.5–5.1)
POTASSIUM: 4 mmol/L (ref 3.5–5.1)
Phosphorus: 4.8 mg/dL — ABNORMAL HIGH (ref 2.5–4.6)
SODIUM: 126 mmol/L — AB (ref 135–145)
Sodium: 129 mmol/L — ABNORMAL LOW (ref 135–145)

## 2015-06-02 LAB — GLUCOSE, CAPILLARY
GLUCOSE-CAPILLARY: 125 mg/dL — AB (ref 65–99)
GLUCOSE-CAPILLARY: 129 mg/dL — AB (ref 65–99)
GLUCOSE-CAPILLARY: 144 mg/dL — AB (ref 65–99)
GLUCOSE-CAPILLARY: 172 mg/dL — AB (ref 65–99)
Glucose-Capillary: 151 mg/dL — ABNORMAL HIGH (ref 65–99)
Glucose-Capillary: 142 mg/dL — ABNORMAL HIGH (ref 65–99)

## 2015-06-02 LAB — CBC
HEMATOCRIT: 32.7 % — AB (ref 39.0–52.0)
HEMOGLOBIN: 11.4 g/dL — AB (ref 13.0–17.0)
MCH: 29.7 pg (ref 26.0–34.0)
MCHC: 34.9 g/dL (ref 30.0–36.0)
MCV: 85.2 fL (ref 78.0–100.0)
Platelets: 182 10*3/uL (ref 150–400)
RBC: 3.84 MIL/uL — ABNORMAL LOW (ref 4.22–5.81)
RDW: 14.1 % (ref 11.5–15.5)
WBC: 27.1 10*3/uL — AB (ref 4.0–10.5)

## 2015-06-02 LAB — HEPARIN LEVEL (UNFRACTIONATED)
HEPARIN UNFRACTIONATED: 1.1 [IU]/mL — AB (ref 0.30–0.70)
HEPARIN UNFRACTIONATED: 0.89 [IU]/mL — AB (ref 0.30–0.70)
Heparin Unfractionated: 1.09 IU/mL — ABNORMAL HIGH (ref 0.30–0.70)

## 2015-06-02 LAB — CARBOXYHEMOGLOBIN
CARBOXYHEMOGLOBIN: 0.6 % (ref 0.5–1.5)
METHEMOGLOBIN: 1.3 % (ref 0.0–1.5)
O2 SAT: 65.2 %
TOTAL HEMOGLOBIN: 11.2 g/dL — AB (ref 13.5–18.0)

## 2015-06-02 LAB — MAGNESIUM: MAGNESIUM: 2 mg/dL (ref 1.7–2.4)

## 2015-06-02 MED ORDER — SODIUM CHLORIDE 0.9 % IV SOLN
200.0000 mg | Freq: Once | INTRAVENOUS | Status: AC
  Administered 2015-06-02: 200 mg via INTRAVENOUS
  Filled 2015-06-02: qty 200

## 2015-06-02 MED ORDER — SODIUM CHLORIDE 0.9 % IV SOLN
1.0000 g | Freq: Two times a day (BID) | INTRAVENOUS | Status: DC
  Filled 2015-06-02: qty 1

## 2015-06-02 MED ORDER — SODIUM CHLORIDE 0.9 % IV SOLN
100.0000 mg | INTRAVENOUS | Status: DC
  Administered 2015-06-03 – 2015-06-09 (×7): 100 mg via INTRAVENOUS
  Filled 2015-06-02 (×8): qty 100

## 2015-06-02 MED ORDER — HEPARIN (PORCINE) IN NACL 100-0.45 UNIT/ML-% IJ SOLN
1000.0000 [IU]/h | INTRAMUSCULAR | Status: DC
  Administered 2015-06-03: 1400 [IU]/h via INTRAVENOUS
  Filled 2015-06-02: qty 250

## 2015-06-02 MED ORDER — SODIUM CHLORIDE 0.9 % IV SOLN
INTRAVENOUS | Status: DC
  Administered 2015-06-02: 10 mL/h via INTRAVENOUS

## 2015-06-02 MED ORDER — AMIODARONE LOAD VIA INFUSION
150.0000 mg | Freq: Once | INTRAVENOUS | Status: AC
  Administered 2015-06-02: 150 mg via INTRAVENOUS
  Filled 2015-06-02: qty 83.34

## 2015-06-02 MED ORDER — HEPARIN (PORCINE) IN NACL 100-0.45 UNIT/ML-% IJ SOLN
2100.0000 [IU]/h | INTRAMUSCULAR | Status: DC
  Administered 2015-06-02: 2100 [IU]/h via INTRAVENOUS
  Filled 2015-06-02: qty 250

## 2015-06-02 MED ORDER — VANCOMYCIN HCL 500 MG IV SOLR
500.0000 mg | INTRAVENOUS | Status: AC
  Administered 2015-06-02: 500 mg via INTRAVENOUS
  Filled 2015-06-02: qty 500

## 2015-06-02 MED ORDER — VANCOMYCIN HCL 10 G IV SOLR
1500.0000 mg | INTRAVENOUS | Status: DC
  Administered 2015-06-03 – 2015-06-07 (×5): 1500 mg via INTRAVENOUS
  Filled 2015-06-02 (×6): qty 1500

## 2015-06-02 MED ORDER — SODIUM CHLORIDE 0.9 % IV SOLN
1.0000 g | Freq: Three times a day (TID) | INTRAVENOUS | Status: DC
  Administered 2015-06-02 – 2015-06-09 (×21): 1 g via INTRAVENOUS
  Filled 2015-06-02 (×25): qty 1

## 2015-06-02 MED FILL — Amiodarone HCl Inj 150 MG/3ML (50 MG/ML): INTRAVENOUS | Qty: 3 | Status: AC

## 2015-06-02 MED FILL — Heparin Sodium (Porcine) Inj 1000 Unit/ML: INTRAMUSCULAR | Qty: 10 | Status: AC

## 2015-06-02 NOTE — Consult Note (Signed)
ANTICOAGULATION CONSULT NOTE - Follow Up Consult  Pharmacy Consult for Heparin Indication: IABP  No Known Allergies  Patient Measurements: Height: 5\' 10"  (177.8 cm) Weight: (!) 356 lb 7.7 oz (161.7 kg) IBW/kg (Calculated) : 73 Heparin Dosing Weight: ~99.5kg  Vital Signs: Temp: 96.1 F (35.6 C) (12/07 1400) Temp Source: Core (Comment) (12/07 0900) BP: 105/73 mmHg (12/07 1400) Pulse Rate: 85 (12/07 1212)  Labs:  Recent Labs  05/31/15 0145  06/01/15 0125 06/01/15 0420 06/01/15 1122 06/01/15 1836 06/02/15 0410 06/02/15 1335  HGB 12.1*  --  11.3*  --   --   --  11.4*  --   HCT 34.5*  --  32.2*  --   --   --  32.7*  --   PLT 195  --  190  --   --   --  182  --   HEPARINUNFRC  --   < >  --  0.54  --   --  1.09* 1.10*  CREATININE 1.83*  < > 1.79*  --  1.67* 1.85* 1.73*  1.74*  --   < > = values in this interval not displayed.  Estimated Creatinine Clearance: 70.6 mL/min (by C-G formula based on Cr of 1.73).   Medical History: Past Medical History  Diagnosis Date  . Kidney calculi   . Hypertension   . History of TIAs   . Arthritis   . Back pain     trouble with lumbar 2, 3, and 4 - degenerative disease per pt  . Unspecified transient cerebral ischemia   . HA (headache)   . Syncope and collapse   . BPH (benign prostatic hyperplasia)   . GERD (gastroesophageal reflux disease)   . Diabetes mellitus without complication (HCC)   . Seizure (HCC)   . BPH (benign prostatic hyperplasia)   . Sarcoidosis of lung (HCC)   . Tobacco abuse    Assessment: 58yom presented on 06/07/2015 to [facility] with acute appendicitis and was taken to the OR for a laparoscopic appendectomy. In the OR he went into cardiac arrest. EKG showed CHB and ST elevations- Code STEMI was called. He was transferred to [facility] and taken emergently to the cath lab where he received a BMS to his RCA, temporary pacemaker and IABP were placed. He was started on heparin. He had also been continued on  aggrastat x 18 hours stopped.  Heparin is infusing peripherally, no interruptions in gtt and no infiltration noted. H/H has trended down but stable, plt remain wnl and stable. Having some pink-red tinged urine but no other significant s/s bleeding. Will continue to monitor closely.    Heparin drip 2100 uts/hr HL remains elevated 1.1.  Confirmed lab drawn from [place] by RN.    Goal of Therapy:  Heparin level 0.2-0.5 units/ml Monitor platelets by anticoagulation protocol: Yes   Plan:  Hold heparin x1 hr  Decrease heparin to 1800 units/hr  Draw HL in 6hr from restart  Daily heparin level and CBC    Leota Sauers Pharm.D. CPP, BCPS Clinical Pharmacist [phone] 06/02/2015 2:37 PM

## 2015-06-02 NOTE — Progress Notes (Signed)
ANTICOAGULATION CONSULT NOTE - Follow Up Consult  Pharmacy Consult for Heparin  Indication: IABP  No Known Allergies  Patient Measurements: Height: 5\' 10"  (177.8 cm) Weight: (!) 356 lb 7.7 oz (161.7 kg) IBW/kg (Calculated) : 73  Vital Signs: Temp: 95.9 F (35.5 C) (12/07 0500) Temp Source: Core (Comment) (12/06 2000) BP: 95/63 mmHg (12/07 0400) Pulse Rate: 103 (12/07 0348)  Labs:  Recent Labs  05/31/15 0145 05/31/15 0450  06/01/15 0125 06/01/15 0420 06/01/15 1122 06/01/15 1836 06/02/15 0410  HGB 12.1*  --   --  11.3*  --   --   --  11.4*  HCT 34.5*  --   --  32.2*  --   --   --  32.7*  PLT 195  --   --  190  --   --   --  182  HEPARINUNFRC  --  0.33  --   --  0.54  --   --  1.09*  CREATININE 1.83*  --   < > 1.79*  --  1.67* 1.85* 1.73*  1.74*  < > = values in this interval not displayed.  Estimated Creatinine Clearance: 70.6 mL/min (by C-G formula based on Cr of 1.73).  Assessment: Elevated heparin level, drawn from art line, pt started CRRT overnight  Goal of Therapy:  Heparin level 0.2-0.5 units/ml Monitor platelets by anticoagulation protocol: Yes   Plan:  -Hold heparin x 1 hr -Re-start heparin drip at 0600 at 2100 units/hr -1400 HL  Scott Holden 06/02/2015,5:01 AM

## 2015-06-02 NOTE — Progress Notes (Signed)
CRITICAL VALUE ALERT  Critical value received:  Ca 6.2  Date of notification:  06/02/15  Time of notification:  0440  Critical value read back:Yes.    Nurse who received alert:  Lexi   MD notified (1st page):  Dr. Nicholos Johns  Time of first page:  0441  MD notified (2nd page):  Time of second page:  Responding MD:  Dr. Nicholos Johns   Time MD responded:  [phone]

## 2015-06-02 NOTE — Progress Notes (Signed)
CRITICAL VALUE ALERT  Critical value received:  1630  Date of notification:  06/02/15 Time of notification:  1630  Critical value read back:yes  Nurse who received alert: Edd Fabian  MD notified (1st page):  Eliott Nine  Time of first page:  1648  MD notified (2nd page): Mannam  Time of second page:1725  Responding MD:  MD  Did not return call passed on to [facility] nurse   Time MD responded:  n/a

## 2015-06-02 NOTE — Procedures (Signed)
Central Venous Catheter Trialysis Insertion Procedure Note Scott Holden [id number] 09-30-1955  Procedure: Insertion of Central Venous Trialysis Catheter Indications: Hemodialysis (CVVHD)  Procedure Details Consent: Risks of procedure as well as the alternatives and risks of each were explained to the (patient/caregiver).  Consent for procedure obtained. Time Out: Verified patient identification, verified procedure, site/side was marked, verified correct patient position, special equipment/implants available, medications/allergies/relevent history reviewed, required imaging and test results available.  Performed  Maximum sterile technique was used including antiseptics, cap, gloves, gown, hand hygiene, mask and sheet. Skin prep: Chlorhexidine; local anesthetic administered A antimicrobial bonded/coated triple lumen catheter was placed in the right femoral vein due to emergent situation using the Seldinger technique.  Evaluation Blood flow good Complications: No apparent complications Patient did tolerate procedure well.   Scott Meres MD 06/02/2015, 12:20 AM

## 2015-06-02 NOTE — Progress Notes (Signed)
ANTIBIOTIC CONSULT NOTE - Follow Up  Pharmacy Consult for Vancomycin and Merrem and Anidulafungin Indication: sepsis and intra-abdominal infection  No Known Allergies  Patient Measurements: Height:  (177.8 cm) Weight: (!) 356 lb 7.7 oz (161.7 kg) IBW/kg (Calculated) : 73  Vital Signs: Temp: 95.9 F (35.5 C) (12/07 1000) Temp Source: Core (Comment) (12/07 0900) BP: 113/77 mmHg (12/07 1000) Pulse Rate: 97 (12/07 0725)  Labs:  Recent Labs  05/31/15 0145  06/01/15 0125 06/01/15 1122 06/01/15 1836 06/02/15 0410  WBC 17.9*  --  19.4*  --   --  27.1*  HGB 12.1*  --  11.3*  --   --  11.4*  PLT 195  --  190  --   --  182  CREATININE 1.83*  < > 1.79* 1.67* 1.85* 1.73*  1.74*  < > = values in this interval not displayed. Estimated Creatinine Clearance: 70.6 mL/min (by C-G formula based on Cr of 1.73).   Microbiology: Recent Results (from the past 720 hour(s))  MRSA PCR Screening     Status: Abnormal   Collection Time: 06/18/2015 11:00 PM  Result Value Ref Range Status   MRSA by PCR POSITIVE (A) NEGATIVE Final    Comment:        The GeneXpert MRSA Assay (FDA approved for NASAL specimens only), is one component of a comprehensive MRSA colonization surveillance program. It is not intended to diagnose MRSA infection nor to guide or monitor treatment for MRSA infections. RESULT CALLED TO, READ BACK BY AND VERIFIED WITH: TOMILSON  05/29/15 MKELLY     Medical History: Past Medical History  Diagnosis Date  . Kidney calculi   . Hypertension   . History of TIAs   . Arthritis   . Back pain     trouble with lumbar 2, 3, and 4 - degenerative disease per pt  . Unspecified transient cerebral ischemia   . HA (headache)   . Syncope and collapse   . BPH (benign prostatic hyperplasia)   . GERD (gastroesophageal reflux disease)   . Diabetes mellitus without complication (HCC)   . Seizure (HCC)   . BPH (benign prostatic hyperplasia)   . Sarcoidosis of lung (HCC)    . Tobacco abuse     Medications:  Prescriptions prior to admission  Medication Sig Dispense Refill Last Dose  . albuterol (PROVENTIL HFA;VENTOLIN HFA) 108 (90 BASE) MCG/ACT inhaler Inhale 1-2 puffs into the lungs every 6 (six) hours as needed for wheezing. 1 Inhaler 0 Past Month at Unknown time  . diltiazem (CARDIZEM CD) 180 MG 24 hr capsule Take 1 capsule (180 mg total) by mouth every morning. 30 capsule 0 Past Week at Unknown time  . dipyridamole-aspirin (AGGRENOX) 200-25 MG per 12 hr capsule Take 1 capsule by mouth 2 (two) times daily. 60 capsule 6 Past Week at Unknown time  . HYDROcodone-acetaminophen (NORCO/VICODIN) 5-325 MG per tablet Take 1 tablet by mouth every 6 (six) hours as needed. (Patient taking differently: Take 1 tablet by mouth every 6 (six) hours as needed for moderate pain. ) 30 tablet 0 05/27/2015 at Unknown time  . ibuprofen (ADVIL,MOTRIN) 200 MG tablet Take 600 mg by mouth every 6 (six) hours as needed for moderate pain.   05/27/2015 at Unknown time  . ipratropium-albuterol (DUONEB) 0.5-2.5 (3) MG/3ML SOLN Take 3 mLs by nebulization every 4 (four) hours as needed (For shortness of breath). 360 mL 3 Past Month at Unknown time  . lamoTRIgine (LAMICTAL) 25 MG tablet TAKE 3 TABLETS BY  MOUTH TWICE A DAY FOR 1 WEEK THEN TAKE 4 TABLETS BY MOUTH TWICE A DAY (Patient taking differently: take 4 tablets by mouth twice a day.) 240 tablet 3 Past Week at Unknown time  . loratadine (CLARITIN) 10 MG tablet Take 10 mg by mouth every morning.   Past Week at Unknown time  . metFORMIN (GLUCOPHAGE) 500 MG tablet Take 1 tablet by mouth 2 (two) times daily.  4 Past Week at Unknown time  . omeprazole (PRILOSEC) 40 MG capsule Take 40 mg by mouth every morning.    Past Week at Unknown time  . traMADol (ULTRAM) 50 MG tablet Take 50 mg by mouth 3 (three) times daily as needed for moderate pain.   05/27/2015 at Unknown time   Scheduled:  . [START ON 06/03/2015] anidulafungin  100 mg Intravenous Q24H  .  anidulafungin  200 mg Intravenous Once  . antiseptic oral rinse  7 mL Mouth Rinse 10 times per day  . aspirin  81 mg Per Tube Daily  . atorvastatin  80 mg Per NG tube q1800  . chlorhexidine gluconate  15 mL Mouth Rinse BID  . Chlorhexidine Gluconate Cloth  6 each Topical Q0600  . clopidogrel  75 mg Per NG tube Daily  . fentaNYL (SUBLIMAZE) injection  50 mcg Intravenous Once  . hydrocortisone sod succinate (SOLU-CORTEF) inj  100 mg Intravenous Q8H  . Influenza vac split quadrivalent PF  0.5 mL Intramuscular Tomorrow-1000  . insulin aspart  0-20 Units Subcutaneous 6 times per day  . meropenem (MERREM) IV  1 g Intravenous 3 times per day  . pantoprazole sodium  40 mg Per Tube Daily  . sodium chloride  10-40 mL Intracatheter Q12H  . sodium chloride  3 mL Intravenous Q12H  . [START ON 06/03/2015] vancomycin  1,500 mg Intravenous Q24H   Infusions:  . sodium chloride 10 mL/hr at 06/02/15 1000  . amiodarone 60 mg/hr (06/02/15 0901)  . DOPamine 2.502 mcg/kg/min (06/02/15 1000)  . fentaNYL infusion INTRAVENOUS 175 mcg/hr (06/02/15 1000)  . heparin 2,100 Units/hr (06/02/15 1000)  . midazolam (VERSED) infusion 3 mg/hr (06/02/15 1000)  . milrinone 0.375 mcg/kg/min (06/02/15 1000)  . norepinephrine (LEVOPHED) Adult infusion 48 mcg/min (06/02/15 1008)  . dialysis replacement fluid (prismasate) 500 mL/hr at 06/02/15 0127  . dialysis replacement fluid (prismasate) 300 mL/hr at 06/02/15 0127  . dialysate (PRISMASATE) 1,000 mL/hr at 06/02/15 0620  . vasopressin (PITRESSIN) infusion - *FOR SHOCK* Stopped (06/01/15 1832)    Assessment: 59yo critically ill male presented to [facility] w/ acute appendicitis and during surgery pt went into VT followed by VF requiring CPR, surgery stopped and pt stabilized, tx'd to [facility] for emergent cath for anterior STEMI, IABP placed in cath lab, in ICU w/ pressors at high rates.  IV ABX for suspected sepsis and intra-abdominal infection.  Patient very volume overloaded since  admission net +40L.  UOP dropped dramatically 12/6 afternoon and CVVHD started 12/7.  Vancomycin  q24 started initially post 2.5Gm load> will adjust for CVVHD give extra  x1 this am and inc  ( /kg) q24 - will check trough prior to next dose in am.  Merrem 1gm IV q8 is dosed on with CVVHD, will begin fungal coverage d/t hypothermic and increased WBC.    Goal of Therapy:  Vancomycin trough level 15-20 mcg/ml  Plan:  Vancomycin  x1 this am then increase Vancomycin  IV q24 Vancomycin trough 12/8 am Merrem 1gm IV q8 Anidulofungin  IV x1 then  q24   Scott Stanley  Hosie Holden.D. CPP, BCPS Clinical Pharmacist [phone] 06/02/2015 11:02 AM

## 2015-06-02 NOTE — Progress Notes (Signed)
Patient ID: Scott Holden, male   DOB: Mar 05, 1956, 59 y.o.   MRN: [id number] 5 Days Post-Op  Subjective: Place on CRRT overnight due to significant increase in fluid retention.  Dopamine restarted and still on levo.  Went back into A fib.  NGT output went up overnight, TFs never started yesterday.  Patient does nod yes to abdominal pain this morning  Objective: Vital signs in last 24 hours: Temp:  [95.7 F (35.4 C)-98.1 F (36.7 C)] 95.9 F (35.5 C) (12/07 0700) Pulse Rate:  [73-103] 103 (12/07 0348) Resp:  [19-26] 20 (12/07 0348) BP: (78-115)/(47-88) 97/70 mmHg (12/07 0700) SpO2:  [78 %-100 %] 95 % (12/07 0700) Arterial Line BP: (76-109)/(44-77) 97/70 mmHg (12/07 0700) FiO2 (%):  [100 %] 100 % (12/07 0400) Weight:  [161.7 kg (356 lb 7.7 oz)] 161.7 kg (356 lb 7.7 oz) (12/07 0422) Last BM Date: 05/27/15  Intake/Output from previous day: 12/06 0701 - 12/07 0700 In: 7455.4 [I.V.:6725.4; NG/GT:80; IV Piggyback:650] Out: 4098 [Urine:1985; Emesis/NG output:1400] Intake/Output this shift:    PE: Abd: soft, seems a bit tender in RLQ, but does not guard, hypoactive to absent BS today, NGT output is dark bilious which is new from yesterday, obese, incisions c/d/i  Lab Results:   Recent Labs  06/01/15 0125 06/02/15 0410  WBC 19.4* 27.1*  HGB 11.3* 11.4*  HCT 32.2* 32.7*  PLT 190 182   BMET  Recent Labs  06/01/15 1836 06/02/15 0410  NA 124* 126*  126*  K 3.8 4.0  4.0  CL 83* 86*  86*  CO2 GLUCOSE 135* 162*  160*  BUN 45* 45*  45*  CREATININE 1.85* 1.73*  1.74*  CALCIUM 6.1* 6.2*  6.2*   PT/INR No results for input(s): LABPROT, INR in the last 72 hours. CMP     Component Value Date/Time   NA 126* 06/02/2015 0410   NA 126* 06/02/2015 0410   NA 140 01/15/2014 0910   K 4.0 06/02/2015 0410   K 4.0 06/02/2015 0410   CL 86* 06/02/2015 0410   CL 86* 06/02/2015 0410   CO2 31 06/02/2015 0410   CO2 31 06/02/2015 0410   GLUCOSE 162* 06/02/2015 0410   GLUCOSE 160* 06/02/2015 0410   GLUCOSE 121* 01/15/2014 0910   BUN 45* 06/02/2015 0410   BUN 45* 06/02/2015 0410   BUN 9 01/15/2014 0910   CREATININE 1.73* 06/02/2015 0410   CREATININE 1.74* 06/02/2015 0410   CALCIUM 6.2* 06/02/2015 0410   CALCIUM 6.2* 06/02/2015 0410   PROT 5.5* 06/02/2015 1852   PROT 6.8 01/15/2014 0910   ALBUMIN 1.7* 06/02/2015 0410   ALBUMIN 4.4 01/15/2014 0910   AST 25 06/01/2015 1852   ALT 16* 06/25/2015 1852   ALKPHOS 60 06/26/2015 1852   BILITOT 1.3* 06/21/2015 1852   GFRNONAA 41* 06/02/2015 0410   GFRNONAA 41* 06/02/2015 0410   GFRAA 48* 06/02/2015 0410   GFRAA 48* 06/02/2015 0410   Lipase     Component Value Date/Time   LIPASE 21 06/15/2015 0445       Studies/Results: Dg Chest Port 1 View  06/01/2015  CLINICAL DATA:  Pulmonary edema . EXAM: PORTABLE CHEST 1 VIEW COMPARISON:  05/31/2015. FINDINGS: Endotracheal tube, NG tube, left IJ catheter, Swan-Ganz catheter, aortic balloon pump new in unchanged position . Cardiomegaly with bilateral pulmonary infiltrates consistent pulmonary edema. Bilateral pleural effusions. No interim change. No pneumothorax . IMPRESSION: 1. Lines and tubes in unchanged position. 2. Cardiomegaly with with diffuse bilateral  pulmonary infiltrates consistent with pulmonary edema. Bilateral small effusions. No interim change from prior exam. Electronically Signed   By: Maisie Fus  Register   On: 06/01/2015 07:19    Anti-infectives: Anti-infectives    Start     Dose/Rate Route Frequency Ordered Stop   06/03/15 0800  anidulafungin (ERAXIS) 100 mg in sodium chloride 0.9 % 100 mL IVPB     100 mg over 90 Minutes Intravenous Every 24 hours 06/02/15 0730     06/02/15 1600  meropenem (MERREM) 1 g in sodium chloride 0.9 % 100 mL IVPB     1 g 200 mL/hr over 30 Minutes Intravenous Every 12 hours 06/02/15 0449     06/02/15 0800  anidulafungin (ERAXIS) 200 mg in sodium chloride 0.9 % 200 mL IVPB     200 mg over 180 Minutes Intravenous  Once  06/02/15 0730     05/30/15 0600  vancomycin (VANCOCIN) 1,250 mg in sodium chloride 0.9 % 250 mL IVPB     1,250 mg 166.7 mL/hr over 90 Minutes Intravenous Every 24 hours 05/29/15 0237     05/29/15 1200  meropenem (MERREM) 1 g in sodium chloride 0.9 % 100 mL IVPB  Status:  Discontinued     1 g 200 mL/hr over 30 Minutes Intravenous Every 8 hours 05/29/15 0237 06/02/15 0449   05/29/15 0600  piperacillin-tazobactam (ZOSYN) IVPB 3.375 g  Status:  Discontinued     3.375 g 12.5 mL/hr over 240 Minutes Intravenous 3 times per day 06/01/2015 2256 05/29/15 0216   05/29/15 0245  vancomycin (VANCOCIN) 2,500 mg in sodium chloride 0.9 % 500 mL IVPB     2,500 mg 250 mL/hr over 120 Minutes Intravenous  Once 05/29/15 0237 05/29/15 0612   05/29/15 0245  meropenem (MERREM) 1 g in sodium chloride 0.9 % 100 mL IVPB     1 g 200 mL/hr over 30 Minutes Intravenous  Once 05/29/15 0237 05/29/15 0339   06/05/2015 2315  piperacillin-tazobactam (ZOSYN) IVPB 3.375 g     3.375 g 100 mL/hr over 30 Minutes Intravenous  Once 06/11/2015 2300 05/29/15 0124   06/06/2015 0830  cefTRIAXone (ROCEPHIN) 2 g in dextrose 5 % 50 mL IVPB  Status:  Discontinued     2 g 100 mL/hr over 30 Minutes Intravenous Every 24 hours 06/12/2015 0815 06/12/2015 2256   06/17/2015 0830  metroNIDAZOLE (FLAGYL) IVPB 500 mg  Status:  Discontinued     500 mg 100 mL/hr over 60 Minutes Intravenous Every 8 hours 05/27/2015 0815 05/27/2015 2256   06/24/2015 0715  piperacillin-tazobactam (ZOSYN) IVPB 3.375 g     3.375 g 12.5 mL/hr over 240 Minutes Intravenous  Once 06/08/2015 0700 06/13/2015 0907       Assessment/Plan  1. POD 5, s/p aborted lap appy due to VF arrest, still with acute appendicitis -cont IV abx therapy -patient nods he is having pain today, NGT output went up and changed since yesterday.  His WBC has also increased to 27K from 19K.  Ideally, a repeat CT scan would help determine whether he is developing an abscess etc, but he is too unstable to go down for a CT  scan.  So our hands are somewhat tied at this point. -return/cont OG to LIWS and defer on TFs at this time as his output has increased. 2. Obstructing right ureteral stone with hydronephrosis -urology saw at [facility] and didn't feel as if intervention needed at this time as his creatinine was normal. Cr up now, but more likely secondary to shock  than stone, but can follow -on CRRT 3. VF arrest with multiple cardiac issues -defer to cardiology and CCM.   LOS: 5 days    Trypp Heckmann E 06/02/2015, 7:34 AM Pager: [phone]

## 2015-06-02 NOTE — Progress Notes (Signed)
PULMONARY / CRITICAL CARE MEDICINE   Name: Scott Holden MRN: [id number] DOB: 08-12-1955    ADMISSION DATE:  06/12/2015 CONSULTATION DATE:  06/13/2015   REFERRING MD :  Dr. Clifton James  CHIEF COMPLAINT:  Cardiogenic and septic shock  INITIAL PRESENTATION:  59M presented to [facility] with several days of progressively worsening RLQ pain found to have an acute appendicitis.  He went to the OR and suffered a VT arrest on the table after the insertion of the trochar.  He was transferred to [facility] after a code STEMI was called and the surgical intervention was aborted.  In the cath lab an RCA DES was placed and an IABP inserted for MI and cardiogenic shock.  While in cath lab, he required shock x6 for VF arrest and post return to ICU, shock x5.     SUBJECTIVE: Continues to be on multiple pressors, lasix and inotropes. Started on CVVH with 200-300 mL off per hour  VITAL SIGNS: Temp:  [95.7 F (35.4 C)-98.1 F (36.7 C)] 95.9 F (35.5 C) (12/07 1000) Pulse Rate:  [73-103] 97 (12/07 0725) Resp:  [19-26] 24 (12/07 0725) BP: (78-115)/(47-88) 113/77 mmHg (12/07 1000) SpO2:  [85 %-100 %] 97 % (12/07 1000) Arterial Line BP: (76-109)/(44-77) 105/73 mmHg (12/07 1000) FiO2 (%):  [100 %] 100 % (12/07 0725) Weight:  [356 lb 7.7 oz (161.7 kg)] 356 lb 7.7 oz (161.7 kg) (12/07 0422)   HEMODYNAMICS: PAP: (30-42)/(23-36) 37/32 mmHg CVP:  [13 mmHg-18 mmHg] 13 mmHg PCWP:  [19 mmHg-20 mmHg] 19 mmHg CO:  [5.8 L/min-6.5 L/min] 6.5 L/min CI:  [2.3 L/min/m2-2.5 L/min/m2] 2.5 L/min/m2   VENTILATOR SETTINGS: Vent Mode:  [-] PCV FiO2 (%):  [100 %] 100 % Set Rate:  [20 bmp-24 bmp] 20 bmp Vt Set:  [440 mL] 440 mL PEEP:  [12 cmH20-18 cmH20] 18 cmH20 Plateau Pressure:  [15 cmH20-33 cmH20] 33 cmH20   INTAKE / OUTPUT:  Intake/Output Summary (Last 24 hours) at 06/02/15 1024 Last data filed at 06/02/15 1000  Gross per 24 hour  Intake 6105.82 ml  Output   6458 ml  Net -352.18 ml    PHYSICAL EXAMINATION: General:   Obese male, critically ill Neuro:  Sedated, unresponsive HEENT:  ETT tube in place, moist mucous membranes Cardiovascular:  Regular rate and rhythm  Lungs:  Bilateral rhonchi, nonlabored Abdomen:  Soft, tender Musculoskeletal:   Normal tone, no acute deformities  Skin:  Generalized edema  LABS:  CBC  Recent Labs Lab 05/31/15 0145 06/01/15 0125 06/02/15 0410  WBC 17.9* 19.4* 27.1*  HGB 12.1* 11.3* 11.4*  HCT 34.5* 32.2* 32.7*  PLT 195 190 182   Coag's No results for input(s): APTT, INR in the last 168 hours.   BMET  Recent Labs Lab 06/01/15 1122 06/01/15 1836 06/02/15 0410  NA 126* 124* 126*  126*  K 3.3* 3.8 4.0  4.0  CL 84* 83* 86*  86*  CO2 BUN 41* 45* 45*  45*  CREATININE 1.67* 1.85* 1.73*  1.74*  GLUCOSE 145* 135* 162*  160*   Electrolytes  Recent Labs Lab 05/31/15 1715  06/01/15 1122 06/01/15 1836 06/02/15 0410  CALCIUM 6.1*  < > 6.0* 6.1* 6.2*  6.2*  MG 1.8  --  2.1  --  2.0  PHOS  --   --   --   --  4.8*  < > = values in this interval not displayed. Sepsis Markers  Recent Labs Lab 05/29/15 0314 05/29/15 0457EDGARDO PETRENKO12/03/16  1456  LATICACIDVEN 6.95* 6.9* 3.9*   ABG  Recent Labs Lab 06/01/15 1236 06/01/15 2111 06/02/15 0350  PHART 7.404 7.326* 7.362  PCO2ART 50.6* 65.3* 53.8*  PO2ART 88.1 50.0* 81.7   Liver Enzymes  Recent Labs Lab 06/18/2015 0445 06/03/2015 1852 06/02/15 0410  AST 20 25  --   ALT 17 16*  --   ALKPHOS 86 60  --   BILITOT 0.7 1.3*  --   ALBUMIN 4.1 3.0* 1.7*   Cardiac Enzymes  Recent Labs Lab 06/03/2015 2309 05/29/15 0445 05/29/15 1000  TROPONINI >65.00* >65.00* >65.00*   Glucose  Recent Labs Lab 06/01/15 0418 06/01/15 0727 06/01/15 1126 06/01/15 1629 06/01/15 1932 06/02/15 0941  GLUCAP 157* 153* 138* 128* 131* 172*    Imaging No results found.   ASSESSMENT / PLAN: 59 y/o male with multiorgan dysfunction syndrome due to mixed cardiogenic shock from intra-operative MI  complicated by septic shock from appendicitis without source control.  His prognosis is overall very poor as he is on supra-maximal doses multiple vaso-pressor agents, IABP and maximum ventilator support for has combined metabolic and respiratory acidosis.  PULMONARY A:  Hypoxemic and hypercapnic respiratory failure - despite max vent support. P:   Wean PEEP / FiO2 for sats > 92% Respiratory alkalosis much better after vent adjustments. No more adjustments today Trend CXR   CARDIOVASCULAR L FEM IABP 12/2 >>  L IJ TLC 12/2 >>  A:  VF Arrest - intraoperatively, s/p LHC with DES to RCA Mixed cardiogenic shock - in the setting of inferior intra-operative MI and septic shock.  IABP 1:1.  ScVO2 68 Torsades / Monomorphic VT, Frequent PVC's - suspect in setting of ischemia  Afib RV Infarct  Swan ganz cath P:  Continue heparin gtt and dual anti-platelet therapy. IABP 1:2. Continue Amiodarone, off Lido Milrinone for ionotope Continue dopamine, levophed, off vasopressin  ASA  Stress dose steroids  Cardiology Following    RENAL A:   AGMA/NAGMA: 2/2 oliguric renal failure and lactic acidemia. Hyponatremia  Acute Kidney Injury - in setting of arrest / hypoperfusion  P:   Vent support as above CVVH Monitor UOP / BMP  Replace electrolytes as indicated  Trend lactic acid   GASTROINTESTINAL A:   Appendicitis - unable to complete surgery for source control.  No evidence of peritonitis or rupture. P:   NPO  PPI  No plan for repeat OR per surgery given his critical condition  HEMATOLOGIC A:   Dual Antiplatelet therapy and heparin gtt. P:  Aggrenox / Plavix Heparin gtt per pharmacy   INFECTIOUS A:   Septic shock - secondary to appendicitis without source control. P:   BCx2 12/2 >>  ABX: Vanc 12/2 >>  Meropenem 12/3 >> Anidulafungin 12/7 >>  Monitor fever curve / WBC  ENDOCRINE A:   Relative adrenal insufficiency.   P:   Hydrocortisone 100mg  IV Q8hours SSI to  resistant scale, CBG Q4  NEUROLOGIC A:   ICU Associated Pain  P:   RASS goal: -2 with vent compliance Fentanyl gtt for pain Versed for sedation   FAMILY  - Updates:  No family at bedside.  Continue all current measures.  NO CPR in the event of arrest.  Shock / drugs ok.   Critical care time-35 minutes.  Chilton Greathouse MD  Pulmonary and Critical Care Pager [phone] If no answer or after 3pm call: [phone] 06/02/2015, 10:24 AM

## 2015-06-02 NOTE — Progress Notes (Signed)
Advanced Heart Failure Rounding Note   Subjective:    Remains intubated/sedated.  VT/VF quiescent x 60 hours but overnight back in A fib RVR so amio was increased to 60 mg per hour. Last night had decreased urine outpu with massive volume overload (90 pounds). Started on CVVHD. Pulling 200 cc per hour. WBC trending up. On vanc, meropenum, and fungal coverage started today.   Remains on dopamine 2.5 mcg  levophed 52 mcg, milrinone 0.375 mcg. + IABP @ 1:2  Co-ox 65%    Swan numbers this am.  RA 13 PA 40/25 PCWP 23 Thermo 6.2/2.4   Objective:   Weight Range:  Vital Signs:   Temp:  [95.7 F (35.4 C)-98.1 F (36.7 C)] 95.9 F (35.5 C) (12/07 0700) Pulse Rate:  [73-103] 103 (12/07 0348) Resp:  [19-26] 20 (12/07 0348) BP: (78-115)/(47-88) 97/70 mmHg (12/07 0700) SpO2:  [78 %-100 %] 95 % (12/07 0700) Arterial Line BP: (76-109)/(44-77) 97/70 mmHg (12/07 0700) FiO2 (%):  [100 %] 100 % (12/07 0400) Weight:  [356 lb 7.7 oz (161.7 kg)] 356 lb 7.7 oz (161.7 kg) (12/07 0422) Last BM Date: 05/27/15  Weight change: Filed Weights   05/31/15 0314 06/01/15 0400 06/02/15 0422  Weight: 343 lb 0.6 oz (155.6 kg) 352 lb 11.8 oz (160 kg) 356 lb 7.7 oz (161.7 kg)    Intake/Output:   Intake/Output Summary (Last 24 hours) at 06/02/15 0730 Last data filed at 06/02/15 0700  Gross per 24 hour  Intake 7455.38 ml  Output   5628 ml  Net 1827.38 ml     Physical Exam: General:  Intubated sedated. HEENT: normal x for ETT Neck: supple. LIJ  RIJ swan Carotids 2+ bilat;  No lymphadenopathy or thryomegaly appreciated. Cor: PMI nonpalpable. Regular rate & rhythm. No rubs, gallops or murmurs. Lungs: clear anteriorly Abdomen: Markedly obese. Soft. Hypoactive BS Extremities: left femoral IABP no cyanosis, clubbing, rash, 4+ edema. HD catheter in R groin.  Neuro: sedated no focal  Telemetry: Afib 90-100s  Labs: Basic Metabolic Panel:  Recent Labs Lab 05/30/15 0534  05/31/15 0145   05/31/15 1715 06/01/15 0125 06/01/15 1122 06/01/15 1836 06/02/15 0410  NA  --   < > 127*  < > 124* 125* 126* 124* 126*  126*  K  --   < > 2.9*  < > 2.8* 3.2* 3.3* 3.8 4.0  4.0  CL  --   < > 87*  < > 83* 87* 84* 83* 86*  86*  CO2  --   < > 29  < > 34* GLUCOSE  --   < > 179*  < > 171* 160* 145* 135* 162*  160*  BUN  --   < > 34*  < > 34* 39* 41* 45* 45*  45*  CREATININE  --   < > 1.83*  < > 1.81* 1.79* 1.67* 1.85* 1.73*  1.74*  CALCIUM  --   < > 6.1*  < > 6.1* 5.9* 6.0* 6.1* 6.2*  6.2*  MG 1.8  --  1.8  --  1.8  --  2.1  --  2.0  PHOS  --   --   --   --   --   --   --   --  4.8*  < > = values in this interval not displayed.  Liver Function Tests:  Recent Labs Lab 05/31/2015 0445 06/20/2015 1852 06/02/15 0410  AST 20 25  --  ALT 17 16*  --   ALKPHOS 86 60  --   BILITOT 0.7 1.3*  --   PROT 7.1 5.5*  --   ALBUMIN 4.1 3.0* 1.7*    Recent Labs Lab 2015-05-31 0445  LIPASE 21   No results for input(s): AMMONIA in the last 168 hours.  CBC:  Recent Labs Lab 2015-05-31 1852 05/29/15 0445 05/29/15 0502 05/30/15 0315 05/31/15 0145 06/01/15 0125 06/02/15 0410  WBC 17.1* 21.4*  --  18.8* 17.9* 19.4* 27.1*  NEUTROABS 15.4*  --   --   --   --   --   --   HGB 13.4 13.5 15.0 12.9* 12.1* 11.3* 11.4*  HCT 40.8 42.2 44.0 38.6* 34.5* 32.2* 32.7*  MCV 91.7 91.5  --  88.7 83.9 82.6 85.2  PLT 209 189  --  220 195 190 182    Cardiac Enzymes:  Recent Labs Lab May 31, 2015 1852 05/31/2015 1853 05/31/2015 2309 05/29/15 0445 05/29/15 1000  CKTOTAL  --  49  --   --   --   CKMB  --  8.9*  --   --   --   TROPONINI 0.65*  --  >65.00* >65.00* >65.00*    BNP: BNP (last 3 results) No results for input(s): BNP in the last 8760 hours.  ProBNP (last 3 results) No results for input(s): PROBNP in the last 8760 hours.    Other results:  Imaging: Dg Chest Port 1 View  06/01/2015  CLINICAL DATA:  Pulmonary edema . EXAM: PORTABLE CHEST 1 VIEW COMPARISON:  05/31/2015.  FINDINGS: Endotracheal tube, NG tube, left IJ catheter, Swan-Ganz catheter, aortic balloon pump new in unchanged position . Cardiomegaly with bilateral pulmonary infiltrates consistent pulmonary edema. Bilateral pleural effusions. No interim change. No pneumothorax . IMPRESSION: 1. Lines and tubes in unchanged position. 2. Cardiomegaly with with diffuse bilateral pulmonary infiltrates consistent with pulmonary edema. Bilateral small effusions. No interim change from prior exam. Electronically Signed   By: Maisie Fus  Register   On: 06/01/2015 07:19     Medications:     Scheduled Medications: . [START ON 06/03/2015] anidulafungin  100 mg Intravenous Q24H  . anidulafungin  200 mg Intravenous Once  . antiseptic oral rinse  7 mL Mouth Rinse 10 times per day  . aspirin  81 mg Per Tube Daily  . atorvastatin  80 mg Per NG tube q1800  . chlorhexidine gluconate  15 mL Mouth Rinse BID  . Chlorhexidine Gluconate Cloth  6 each Topical Q0600  . clopidogrel  75 mg Per NG tube Daily  . feeding supplement (VITAL HIGH PROTEIN)  1,000 mL Per Tube Q24H  . fentaNYL (SUBLIMAZE) injection  50 mcg Intravenous Once  . hydrocortisone sod succinate (SOLU-CORTEF) inj  100 mg Intravenous Q8H  . Influenza vac split quadrivalent PF  0.5 mL Intramuscular Tomorrow-1000  . insulin aspart  0-20 Units Subcutaneous 6 times per day  . meropenem (MERREM) IV  1 g Intravenous Q12H  . mupirocin ointment  1 application Nasal BID  . pantoprazole sodium  40 mg Per Tube Daily  . sodium chloride  10-40 mL Intracatheter Q12H  . sodium chloride  3 mL Intravenous Q12H  . vancomycin  1,250 mg Intravenous Q24H    Infusions: . sodium chloride 10 mL/hr at 06/01/15 2000  . amiodarone 60 mg/hr (06/02/15 0148)  . DOPamine 2.5 mcg/kg/min (06/02/15 0506)  . fentaNYL infusion INTRAVENOUS 175 mcg/hr (06/02/15 0128)  . furosemide (LASIX) infusion 30 mg/hr (06/02/15 0242)  . heparin 2,100 Units/hr (  06/02/15 0600)  . lidocaine 1 mg/min (06/02/15  0647)  . midazolam (VERSED) infusion 3 mg/hr (06/02/15 0213)  . milrinone 0.375 mcg/kg/min (06/02/15 0234)  . norepinephrine (LEVOPHED) Adult infusion 52 mcg/min (06/02/15 0700)  . dialysis replacement fluid (prismasate) 500 mL/hr at 06/02/15 0127  . dialysis replacement fluid (prismasate) 300 mL/hr at 06/02/15 0127  . dialysate (PRISMASATE) 1,000 mL/hr at 06/02/15 0620  . vasopressin (PITRESSIN) infusion - *FOR SHOCK* Stopped (06/01/15 1832)    PRN Medications: sodium chloride, fentaNYL, heparin, heparin, midazolam, midazolam, morphine injection, ondansetron **OR** ondansetron (ZOFRAN) IV, simethicone, sodium chloride, sodium chloride   Assessment:   1. Acute systolic HF with cardiogenic shock with biventricular dysfunction R>L 2. Acute inferior STEMI with RV involvement   --s/p PCI RCA on 12/2 3. Acute respiratory failure 4. AKI 5. Acute appendicitis being treated medically 6. Recurrent VT/VF 7. New onset AF 8. Hypomagnesemia 9. Hypocalcemia 10. Hypokalemia 11. Acute Renal Failure 12. Hyponatremia  Plan/Discussion:    Started on CVVHD last night. Off lasix.   No VT/VF X 60 hours. Swan numbers stable on multiple pressors. Will continue IABP at 1:2.  Remains on lido 1 mg + amio 60 mg per hour.   WBC trending up. Continue vanc, meropenum, and fungal coverage.   Continue IABP for now.   Length of Stay: 5  Amy Clegg NP-C  06/02/2015, 7:30 AM  Advanced Heart Failure Team Pager [phone] (M-F; 7a - 4p)  Please contact [facility] for night-coverage after hours (4p -7a ) and weekends on [email or web address]  Patient seen and examined with Tonye Becket, NP. We discussed all aspects of the encounter. I agree with the assessment and plan as stated above.   Started on CVVHD last night for massive volume overload (90 pounds) now removing 200-300/hr. Lasix stopped. Urine output poor. Remains on multiple pressors and IABP 1:2. Swan numbers improved. Will try to wean inotropes some  today and get IABP out tomorrow. WBC increasing. Belly not acute. Will continue abx and add fungal coverage. Stop lido. Continue amio. Family updated in detail.   The patient is critically ill with multiple organ systems failure and requires high complexity decision making for assessment and support, frequent evaluation and titration of therapies, application of advanced monitoring technologies and extensive interpretation of multiple databases.   Critical Care Time devoted to patient care services described in this note is 45 Minutes  Bensimhon, Daniel,MD 8:27 PM  .

## 2015-06-02 NOTE — Progress Notes (Signed)
Nutrition Follow-up  DOCUMENTATION CODES:   Obesity unspecified  INTERVENTION:   If pt becomes more stable may need to consider TPN if aggressive measures continue.  NUTRITION DIAGNOSIS:   Inadequate oral intake related to inability to eat as evidenced by NPO status. Ongoing.   GOAL:   Provide needs based on ASPEN/SCCM guidelines Not met.   MONITOR:   Vent status, Labs, Weight trends, TF tolerance, I & O's  ASSESSMENT:   41M presented to [facility] with several days of progressively worsening RLQ pain found to have an acute appendicitis. He went to the OR and suffered a VT arrest on the table after the insertion of the trochar. He was transferred to [facility] after a code STEMI was called and the surgical intervention was aborted. In the cath lab an RCA DES was placed and an IABP inserted for MI and cardiogenic shock. While in cath lab, he required shock x6 for VF arrest and post return to ICU, shock x5.   Patient is currently intubated on ventilator support MV: 12.8 L/min Temp (24hrs), Avg:97.1 F (36.2 C), Min:95.7 F (35.4 C), Max:98.1 F (36.7 C)  TF never started 12/6 due to increasing OG tube output. 1400 ml out 12/6. Pt started CVVHD over night and remains unstable. Pt is +42 L. Labs reviewed: sodium low (126)   Diet Order:  Diet NPO time specified  Skin:  Reviewed, no issues  Last BM:  12/1 abd distended   Height:   Ht Readings from Last 1 Encounters:  06/03/2015 _0  (1.778 m)   Weight:   Wt Readings from Last 1 Encounters:  06/02/15 356 lb 7.7 oz (161.7 kg)   Ideal Body Weight:  75.5 kg  BMI:  Body mass index is 51.15 kg/(m^2).  Estimated Nutritional Needs:   Kcal:  3317-4099  Protein:  >151 grams  Fluid:  1.6-1.8 L  EDUCATION NEEDS:   Education needs no appropriate at this time  [place], [place], [place] Pager [phone] After Hours Pager

## 2015-06-02 NOTE — Progress Notes (Signed)
ANTICOAGULATION CONSULT NOTE - Follow Up Consult  Pharmacy Consult for heparin Indication: IABP  Labs:  Recent Labs  05/31/15 0145  06/01/15 0125  06/01/15 1836 06/02/15 0410 06/02/15 1335 06/02/15 1600 06/02/15 2215  HGB 12.1*  --  11.3*  --   --  11.4*  --   --   --   HCT 34.5*  --  32.2*  --   --  32.7*  --   --   --   PLT 195  --  190  --   --  182  --   --   --   HEPARINUNFRC  --   < >  --   < >  --  1.09* 1.10*  --  0.89*  CREATININE 1.83*  < > 1.79*  < > 1.85* 1.73*  1.74*  --  1.47*  --   < > = values in this interval not displayed.   Assessment: 59yo male remains above goal on heparin after rate decreases, RN reports NO heparin w/ CRRT, had been requiring high rate prior to CRRT w/ fluid overload.  Goal of Therapy:  Heparin level 0.2-0.5 units/ml   Plan:  Will decrease heparin gtt by 3 units/kgABW/hr to 1400 units/hr and check level in 6hr.  Vernard Gambles, PharmD, BCPS  06/02/2015,11:46 PM

## 2015-06-02 NOTE — Progress Notes (Signed)
[facility] Rounding Note  Subjective:  CRRT initiated last PM - tolerating 200-300 per hour off UOP continues to trail off Still getting diuretics IABP 1:2 Worsening leukocytosis ATB's now vanco/meropenem/antifungal  Objective:    Vital signs in last 24 hours: Filed Vitals:   06/02/15 0600 06/02/15 0700 06/02/15 0725 06/02/15 0800  BP: 105/77 97/70 100/71 102/73  Pulse:   97   Temp: 95.7 F (35.4 C) 95.9 F (35.5 C)    TempSrc:    Core (Comment)  Resp:   24   Height:      Weight:      SpO2: 96% 95% 96%    Weight change: 1.7 kg (3 lb 12 oz)  Intake/Output Summary (Last 24 hours) at 06/02/15 0843 Last data filed at 06/02/15 0800  Gross per 24 hour  Intake 7018.08 ml  Output   5715 ml  Net 1303.08 ml   Swan numbers this am.  RA 13 PA 40/25 PCWP 23  Physical Exam:  Blood pressure 102/73, pulse 97, temperature 95.9 F (35.5 C), temperature source Core (Comment), resp. rate 24, height  (1.778 m), weight 161.7 kg (356 lb 7.7 oz), SpO2 96 %. General appearance: Intubated and sedated, morbidly obese. Multiple lines Gross anasarca Head: eyes open Resp: rhonchi bilaterally Cardio: Very distant, no rub, regular rhythm GI: obese, distended/tense, absent bowel sounds. IABP audible. S Extremities: edema 2+ anasarca to abdomen Neurologic: Mental status: sedated and unresponsive  Weight trending: 12/2 119.25 kg 12/6 160 kg  CRRT started (late PM) 12/7 161.7 kg  Labs:   Recent Labs Lab 05/31/15 0145 05/31/15 0900 05/31/15 1715 06/01/15 0125 06/01/15 1122 06/01/15 1836 06/02/15 0410  NA 127* 127* 124* 125* 126* 124* 126*  126*  K 2.9* 4.4 2.8* 3.2* 3.3* 3.8 4.0  4.0  CL 87* 85* 83* 87* 84* 83* 86*  86*  CO2 29 31 34* GLUCOSE 179* 186* 171* 160* 145* 135* 162*  160*  BUN 34* 35* 34* 39* 41* 45* 45*  45*  CREATININE 1.83* 1.82* 1.81* 1.79* 1.67* 1.85* 1.73*  1.74*  CALCIUM 6.1* 6.3* 6.1* 5.9* 6.0* 6.1* 6.2*  6.2*   PHOS  --   --   --   --   --   --  4.8*     Recent Labs Lab 06/12/2015 0445 06/19/2015 1852 06/02/15 0410  AST 20 25  --   ALT 17 16*  --   ALKPHOS 86 60  --   BILITOT 0.7 1.3*  --   PROT 7.1 5.5*  --   ALBUMIN 4.1 3.0* 1.7*    Recent Labs Lab 06/12/2015 0445  LIPASE 21     Recent Labs Lab 06/14/2015 1852  05/30/15 0315 05/31/15 0145 06/01/15 0125 06/02/15 0410  WBC 17.1*  < > 18.8* 17.9* 19.4* 27.1*  NEUTROABS 15.4*  --   --   --   --   --   HGB 13.4  < > 12.9* 12.1* 11.3* 11.4*  HCT 40.8  < > 38.6* 34.5* 32.2* 32.7*  MCV 91.7  < > 88.7 83.9 82.6 85.2  PLT 209  < > 220 195 190 182  < > = values in this interval not displayed.     Recent Labs Lab 06/06/2015 1852 06/13/2015 1853 06/07/2015 2309 05/29/15 0445 05/29/15 1000  CKTOTAL  --  49  --   --   --   CKMB  --  8.9*  --   --   --  TROPONINI 0.65*  --  >65.00* >65.00* >65.00*     Recent Labs Lab 06/01/15 0418 06/01/15 0727 06/01/15 1126 06/01/15 1629 06/01/15 1932  GLUCAP 157* 153* 138* 128* 131*     Results for EDDY, LISZEWSKI (MRN [id number]) as of 06/02/2015 08:50  Ref. Range 06/02/2015 03:50  Peep/cpap Latest Units: cm H20 18.0  Pressure control Latest Units: cm H20 20  pH, Arterial Latest Ref Range: 7.350-7.450  7.362  pCO2 arterial Latest Ref Range: 35.0-45.0 mmHg 53.8 (H)  pO2, Arterial Latest Ref Range: 80.0-100.0 mmHg 81.7  Bicarbonate Latest Ref Range: 20.0-24.0 mEq/L 29.8 (H)  TCO2 Latest Ref Range: 0-100 mmol/L 31.4  Acid-Base Excess Latest Ref Range: 0.0-2.0 mmol/L 4.7 (H)  O2 Saturation Latest Units: % 94.8   Studies/Results: Dg Chest Port 1 View  06/01/2015  CLINICAL DATA:  Pulmonary edema . EXAM: PORTABLE CHEST 1 VIEW COMPARISON:  05/31/2015. FINDINGS: Endotracheal tube, NG tube, left IJ catheter, Swan-Ganz catheter, aortic balloon pump new in unchanged position . Cardiomegaly with bilateral pulmonary infiltrates consistent pulmonary edema. Bilateral pleural effusions. No interim change.  No pneumothorax . IMPRESSION: 1. Lines and tubes in unchanged position. 2. Cardiomegaly with with diffuse bilateral pulmonary infiltrates consistent with pulmonary edema. Bilateral small effusions. No interim change from prior exam. Electronically Signed   By: Maisie Fus  Register   On: 06/01/2015 07:19   Medications . sodium chloride 10 mL/hr at 06/02/15 0800  . amiodarone 60 mg/hr (06/02/15 0800)  . DOPamine 2.502 mcg/kg/min (06/02/15 0800)  . fentaNYL infusion INTRAVENOUS 175 mcg/hr (06/02/15 0800)  . furosemide (LASIX) infusion 30 mg/hr (06/02/15 0800)  . heparin 2,100 Units/hr (06/02/15 0800)  . lidocaine 1 mg/min (06/02/15 0800)  . midazolam (VERSED) infusion 3 mg/hr (06/02/15 0800)  . milrinone 0.375 mcg/kg/min (06/02/15 0800)  . norepinephrine (LEVOPHED) Adult infusion 52.053 mcg/min (06/02/15 0800)  . dialysis replacement fluid (prismasate) 500 mL/hr at 06/02/15 0127  . dialysis replacement fluid (prismasate) 300 mL/hr at 06/02/15 0127  . dialysate (PRISMASATE) 1,000 mL/hr at 06/02/15 0620  . vasopressin (PITRESSIN) infusion - *FOR SHOCK* Stopped (06/01/15 1832)   . [START ON 06/03/2015] anidulafungin  100 mg Intravenous Q24H  . anidulafungin  200 mg Intravenous Once  . antiseptic oral rinse  7 mL Mouth Rinse 10 times per day  . aspirin  81 mg Per Tube Daily  . atorvastatin  80 mg Per NG tube q1800  . chlorhexidine gluconate  15 mL Mouth Rinse BID  . Chlorhexidine Gluconate Cloth  6 each Topical Q0600  . clopidogrel  75 mg Per NG tube Daily  . fentaNYL (SUBLIMAZE) injection  50 mcg Intravenous Once  . hydrocortisone sod succinate (SOLU-CORTEF) inj  100 mg Intravenous Q8H  . Influenza vac split quadrivalent PF  0.5 mL Intramuscular Tomorrow-1000  . insulin aspart  0-20 Units Subcutaneous 6 times per day  . meropenem (MERREM) IV  1 g Intravenous Q12H  . mupirocin ointment  1 application Nasal BID  . pantoprazole sodium  40 mg Per Tube Daily  . sodium chloride  10-40 mL  Intracatheter Q12H  . sodium chloride  3 mL Intravenous Q12H  . [START ON 06/03/2015] vancomycin  1,500 mg Intravenous Q24H  . vancomycin  500 mg Intravenous NOW    Background: 59 yo M with PMH sig for HTN, DM, nephrolithiasis, tobacco abuse, and TIA's who presented to [facility] on 06-22-2015 with right sided abdominal pain and was found to have acute appendicitis. He went for surgery, however after the first trocar was introduced,  he suffered a VT cardiac arrest on the table (surgery aborted). He was transferred to [facility] after a code STEMI was called. Had cath with DES to RCA and  insertion of an IABP for MI and cardiogenic shock, started on pressors and transferred to CCU. Scr peaked at 2.24 following cath and was improving until 12/6 when his UOP dropped, O2 demands increased. Noted to be 40L +. We were consulted to further evaluate ARF and provide CRRT for volume management. In addition CT scan revealed hydro of right ureter with evidence of 6x80mm calculus with bilateral hydronephrosis (marked hydro on R).   Assessment/Plan: 1. ARF, non-oliguric in setting of cardiogenic shock/sepsis and IV contrast as well as bilateral hydronephrosis and obstructing stone in right ureter. CRRT initiated 12/6 and pt tolerating volume removal 200-300/hour net negative. Volume is 40 kg up.  2. Hypokalemia - corrected 3. Hypocalcemia - rec'd Ca gluconate yesterday. Recheck ionized Ca.  4. Nephrolithiasis with obstructive uropathy/right hydronephrosis - too unstable for any procedure at this time. Continue with supportive measures 5. Acute inferior STEMI with RV involvement/acute systolic heart failure/RV infarct - s/p PTCA and DES placement in RCA complicated by cardiogenic shock requiring an IABP and pressors.  Discontinue lasix drip 6. Acute appendicitis with gangrenous changes but unable to remove due to his cardiac arrest and critical illness. Continue with antibiotics and pressors 7. VDRF with increasing  oxygen requirements and massive  volume overload.Remains on 100% FIO2 8. SIRS/multi-organ failure- cardiogenic and sepsis. On pressors, antibiotics, and IABP 9. Recurrent VT- per Cardiology 10. HEME- on heparin gtt  11. Disposition- overall prognosis would appear grim due to multiple issues which cannot be addressed due to his critical condition. Sister updated on renal aspects of care this AM.    Camille Bal, MD [facility] [phone] Pager 06/02/2015, 8:43 AM

## 2015-06-02 NOTE — Progress Notes (Signed)
  Patient progressively volume overloaded with worsening oxygenation with sats in 80s despite full vent support. Weight now up 90 pounds from baseline. + anasarca on exam.  Urine output sluggish despite lasix drip at 30 + metolazone and a dose of IV diuril.   Remains on 3 pressors. Dopamine now restarted for help with renal perfusion. But developed recurrent AF. Amio increased to 60.   I discussed case with Dr. Arrie Aran who agree with need for UF.   Trialysis catheter placed in R femoral vein.   The patient is critically ill with multiple organ systems failure and requires high complexity decision making for assessment and support, frequent evaluation and titration of therapies, application of advanced monitoring technologies and extensive interpretation of multiple databases.   Critical Care Time devoted to patient care services described in this note is 35 Minutes not including time for catheter placement.  Bensimhon, Daniel,MD 12:19 AM

## 2015-06-03 ENCOUNTER — Encounter (HOSPITAL_COMMUNITY): Payer: Self-pay | Admitting: *Deleted

## 2015-06-03 LAB — RENAL FUNCTION PANEL
ALBUMIN: 1.7 g/dL — AB (ref 3.5–5.0)
Anion gap: 9 (ref 5–15)
BUN: 38 mg/dL — ABNORMAL HIGH (ref 6–20)
CALCIUM: 6.5 mg/dL — AB (ref 8.9–10.3)
CO2: 27 mmol/L (ref 22–32)
CREATININE: 1.59 mg/dL — AB (ref 0.61–1.24)
Chloride: 95 mmol/L — ABNORMAL LOW (ref 101–111)
GFR, EST AFRICAN AMERICAN: 53 mL/min — AB (ref >60)
GFR, EST NON AFRICAN AMERICAN: 46 mL/min — AB (ref >60)
Glucose, Bld: 116 mg/dL — ABNORMAL HIGH (ref 65–99)
Phosphorus: 4 mg/dL (ref 2.5–4.6)
Potassium: 4.1 mmol/L (ref 3.5–5.1)
SODIUM: 131 mmol/L — AB (ref 135–145)

## 2015-06-03 LAB — HEPARIN LEVEL (UNFRACTIONATED): Heparin Unfractionated: 0.91 IU/mL — ABNORMAL HIGH (ref 0.30–0.70)

## 2015-06-03 LAB — BASIC METABOLIC PANEL
Anion gap: 10 (ref 5–15)
BUN: 38 mg/dL — AB (ref 6–20)
CALCIUM: 6.5 mg/dL — AB (ref 8.9–10.3)
CO2: 29 mmol/L (ref 22–32)
CREATININE: 1.5 mg/dL — AB (ref 0.61–1.24)
Chloride: 90 mmol/L — ABNORMAL LOW (ref 101–111)
GFR calc Af Amer: 57 mL/min — ABNORMAL LOW (ref >60)
GFR, EST NON AFRICAN AMERICAN: 49 mL/min — AB (ref >60)
Glucose, Bld: 150 mg/dL — ABNORMAL HIGH (ref 65–99)
Potassium: 4.1 mmol/L (ref 3.5–5.1)
SODIUM: 129 mmol/L — AB (ref 135–145)

## 2015-06-03 LAB — CARBOXYHEMOGLOBIN
CARBOXYHEMOGLOBIN: 0.8 % (ref 0.5–1.5)
Carboxyhemoglobin: 0.8 % (ref 0.5–1.5)
Methemoglobin: 1.2 % (ref 0.0–1.5)
Methemoglobin: 0.9 % (ref 0.0–1.5)
O2 Saturation: 82.9 %
O2 Saturation: 65.7 %
TOTAL HEMOGLOBIN: 10.7 g/dL — AB (ref 13.5–18.0)
Total hemoglobin: 10.8 g/dL — ABNORMAL LOW (ref 13.5–18.0)

## 2015-06-03 LAB — POCT I-STAT 3, ART BLOOD GAS (G3+)
Bicarbonate: 25.3 mEq/L — ABNORMAL HIGH (ref 20.0–24.0)
O2 SAT: 96 %
PCO2 ART: 41.5 mmHg (ref 35.0–45.0)
PO2 ART: 74 mmHg — AB (ref 80.0–100.0)
Patient temperature: 35.3
TCO2: 27 mmol/L (ref 0–100)
pH, Arterial: 7.386 (ref 7.350–7.450)

## 2015-06-03 LAB — CBC WITH DIFFERENTIAL/PLATELET
BASOS PCT: 0 %
Basophils Absolute: 0 10*3/uL (ref 0.0–0.1)
EOS PCT: 0 %
Eosinophils Absolute: 0 10*3/uL (ref 0.0–0.7)
HCT: 30.8 % — ABNORMAL LOW (ref 39.0–52.0)
HEMOGLOBIN: 10.5 g/dL — AB (ref 13.0–17.0)
LYMPHS PCT: 5 %
Lymphs Abs: 1.3 10*3/uL (ref 0.7–4.0)
MCH: 28.9 pg (ref 26.0–34.0)
MCHC: 34.1 g/dL (ref 30.0–36.0)
MCV: 84.8 fL (ref 78.0–100.0)
MONOS PCT: 5 %
Monocytes Absolute: 1.3 10*3/uL — ABNORMAL HIGH (ref 0.1–1.0)
NEUTROS ABS: 22.5 10*3/uL — AB (ref 1.7–7.7)
NEUTROS PCT: 90 %
PLATELETS: 278 10*3/uL (ref 150–400)
RBC: 3.63 MIL/uL — ABNORMAL LOW (ref 4.22–5.81)
RDW: 14.6 % (ref 11.5–15.5)
WBC: 25.1 10*3/uL — ABNORMAL HIGH (ref 4.0–10.5)

## 2015-06-03 LAB — POCT ACTIVATED CLOTTING TIME
ACTIVATED CLOTTING TIME: 194 s
ACTIVATED CLOTTING TIME: 178 s
Activated Clotting Time: 178 seconds

## 2015-06-03 LAB — ALBUMIN: Albumin: 1.8 g/dL — ABNORMAL LOW (ref 3.5–5.0)

## 2015-06-03 LAB — GLUCOSE, CAPILLARY
GLUCOSE-CAPILLARY: 114 mg/dL — AB (ref 65–99)
GLUCOSE-CAPILLARY: 119 mg/dL — AB (ref 65–99)
GLUCOSE-CAPILLARY: 133 mg/dL — AB (ref 65–99)
Glucose-Capillary: 142 mg/dL — ABNORMAL HIGH (ref 65–99)
Glucose-Capillary: 134 mg/dL — ABNORMAL HIGH (ref 65–99)

## 2015-06-03 LAB — CALCIUM, IONIZED: Calcium, Ionized, Serum: 3.5 mg/dL — ABNORMAL LOW (ref 4.5–5.6)

## 2015-06-03 LAB — MAGNESIUM: MAGNESIUM: 2.3 mg/dL (ref 1.7–2.4)

## 2015-06-03 LAB — PHOSPHORUS: PHOSPHORUS: 4.4 mg/dL (ref 2.5–4.6)

## 2015-06-03 LAB — VANCOMYCIN, TROUGH: Vancomycin Tr: 14 ug/mL (ref 10.0–20.0)

## 2015-06-03 MED ORDER — VASOPRESSIN 20 UNIT/ML IV SOLN
0.0300 [IU]/min | INTRAVENOUS | Status: DC
  Administered 2015-06-03 – 2015-06-08 (×5): 0.03 [IU]/min via INTRAVENOUS
  Filled 2015-06-03 (×6): qty 2

## 2015-06-03 MED ORDER — HEPARIN (PORCINE) IN NACL 100-0.45 UNIT/ML-% IJ SOLN
950.0000 [IU]/h | INTRAMUSCULAR | Status: DC
  Administered 2015-06-03: 1000 [IU]/h via INTRAVENOUS
  Administered 2015-06-04: 1150 [IU]/h via INTRAVENOUS
  Administered 2015-06-05 – 2015-06-08 (×3): 950 [IU]/h via INTRAVENOUS
  Filled 2015-06-03 (×4): qty 250

## 2015-06-03 NOTE — Progress Notes (Signed)
PULMONARY / CRITICAL CARE MEDICINE   Name: Scott Holden MRN: [id number] DOB: 1955-10-14    ADMISSION DATE:  06/16/2015 CONSULTATION DATE:  06-16-2015   REFERRING MD :  Dr. Clifton James  CHIEF COMPLAINT:  Cardiogenic and septic shock  INITIAL PRESENTATION:  27M presented to [facility] with several days of progressively worsening RLQ pain found to have an acute appendicitis.  He went to the OR and suffered a VT arrest on the table after the insertion of the trochar.  He was transferred to [facility] after a code STEMI was called and the surgical intervention was aborted.  In the cath lab an RCA DES was placed and an IABP inserted for MI and cardiogenic shock.  While in cath lab, he required shock x6 for VF arrest and post return to ICU, shock x5.     SUBJECTIVE: Continues to be on Levophed and amiodarone. Weaned off the dopamine. Continues on CVVH with 200-300 mL off per hour  VITAL SIGNS: Temp:  [95.5 F (35.3 C)-96.4 F (35.8 C)] 96.3 F (35.7 C) (12/08 1100) Pulse Rate:  [82-89] 82 (12/08 0828) Resp:  [20-25] 20 (12/08 1100) BP: (89-116)/(52-74) 107/59 mmHg (12/08 0828) SpO2:  [90 %-100 %] 94 % (12/08 1100) Arterial Line BP: (90-134)/(50-94) 112/54 mmHg (12/08 1100) FiO2 (%):  [80 %-100 %] 80 % (12/08 1100) Weight:  [342 lb 6 oz (155.3 kg)] 342 lb 6 oz (155.3 kg) (12/08 0559)   HEMODYNAMICS: PAP: (35-46)/(23-35) 37/23 mmHg CVP:  [10 mmHg-18 mmHg] 15 mmHg PCWP:  [18 mmHg-20 mmHg] 18 mmHg CO:  [6.8 L/min-6.9 L/min] 6.9 L/min CI:  [2.7 L/min/m2] 2.7 L/min/m2   VENTILATOR SETTINGS: Vent Mode:  [-] PCV FiO2 (%):  [80 %-100 %] 80 % Set Rate:  [20 bmp] 20 bmp PEEP:  [18 cmH20] 18 cmH20 Plateau Pressure:  [30 cmH20-37 cmH20] 30 cmH20   INTAKE / OUTPUT:  Intake/Output Summary (Last 24 hours) at 06/03/15 1125 Last data filed at 06/03/15 1100  Gross per 24 hour  Intake 4595.54 ml  Output  91478 ml  Net -6366.46 ml    PHYSICAL EXAMINATION: General:  Obese male, critically ill Neuro:   Sedated, unresponsive HEENT:  ETT tube in place, moist mucous membranes Cardiovascular:  Regular rate and rhythm  Lungs:  Bilateral rhonchi, nonlabored Abdomen:  Soft, tender Musculoskeletal:   Normal tone, no acute deformities  Skin:  Generalized edema  LABS:  CBC  Recent Labs Lab 06/01/15 0125 06/02/15 0410 06/03/15 0805  WBC 19.4* 27.1* 25.1*  HGB 11.3* 11.4* 10.5*  HCT 32.2* 32.7* 30.8*  PLT 190 182 278   Coag's No results for input(s): APTT, INR in the last 168 hours.   BMET  Recent Labs Lab 06/02/15 0410 06/02/15 1600 06/03/15 0416  NA 126*  126* 129* 129*  K 4.0  4.0 4.0 4.1  CL 86*  86* 90* 90*  CO2 31  31 29 29   BUN 45*  45* 40* 38*  CREATININE 1.73*  1.74* 1.47* 1.50*  GLUCOSE 162*  160* 154* 150*   Electrolytes  Recent Labs Lab 06/01/15 1122  06/02/15 0410 06/02/15 1600 06/03/15 0416  CALCIUM 6.0*  < > 6.2*  6.2* 6.4* 6.5*  MG 2.1  --  2.0  --  2.3  PHOS  --   --  4.8* 4.4 4.4  < > = values in this interval not displayed. Sepsis Markers  Recent Labs Lab 05/29/15 0314 05/29/15 0457 05/29/15 1456  LATICACIDVEN 6.95* 6.9* 3.9*   ABG  Recent  Labs Lab 06/01/15 2111 06/02/15 0350 06/03/15 0900  PHART 7.326* 7.362 7.386  PCO2ART 65.3* 53.8* 41.5  PO2ART 50.0* 81.7 74.0*   Liver Enzymes  Recent Labs Lab 06/25/2015 0445 06/20/2015 1852 06/02/15 0410 06/02/15 1600 06/03/15 0416  AST 20 25  --   --   --   ALT 17 16*  --   --   --   ALKPHOS 86 60  --   --   --   BILITOT 0.7 1.3*  --   --   --   ALBUMIN 4.1 3.0* 1.7* 1.8* 1.8*   Cardiac Enzymes  Recent Labs Lab 05/29/2015 2309 05/29/15 0445 05/29/15 1000  TROPONINI >65.00* >65.00* >65.00*   Glucose  Recent Labs Lab 06/02/15 1258 06/02/15 1556 06/02/15 1957 06/03/15 0013 06/03/15 0408 06/03/15 0806  GLUCAP 142* 144* 129* 133* 142* 134*    Imaging No results found.   ASSESSMENT / PLAN: 59 y/o male with multiorgan dysfunction syndrome due to mixed  cardiogenic shock from intra-operative MI complicated by septic shock from appendicitis without source control.  His prognosis is overall very poor as he is on supra-maximal doses multiple vaso-pressor agents, IABP and maximum ventilator support for has combined metabolic and respiratory acidosis.  PULMONARY A:  Hypoxemic and hypercapnic respiratory failure - despite max vent support. P:   No changes to the vent today as O2 sats is still borderline. Trend CXR   CARDIOVASCULAR L FEM IABP 12/2 >>  L IJ TLC 12/2 >>  A:  VF Arrest - intraoperatively, s/p LHC with DES to RCA Mixed cardiogenic shock - in the setting of inferior intra-operative MI and septic shock.  IABP 1:1.  ScVO2 68 Torsades / Monomorphic VT, Frequent PVC's - suspect in setting of ischemia  Afib RV Infarct  Swan ganz cath P:  Continue heparin gtt and dual anti-platelet therapy. IABP 1:2. will likely be discontinued today  Continue Amiodarone, off Lido Continue levophed, off vasopressin and dopamine  ASA  Stress dose steroids  Cardiology Following    RENAL A:   AGMA/NAGMA: 2/2 oliguric renal failure and lactic acidemia. Hyponatremia  Acute Kidney Injury - in setting of arrest / hypoperfusion  P:   Vent support as above CVVH Monitor UOP / BMP  Replace electrolytes as indicated  Trend lactic acid   GASTROINTESTINAL A:   Appendicitis - unable to complete surgery for source control.  No evidence of peritonitis or rupture. P:   NPO  PPI  No plan for repeat OR per surgery given his critical condition  HEMATOLOGIC A:   Dual Antiplatelet therapy and heparin gtt. P:  Aggrenox / Plavix Heparin gtt per pharmacy   INFECTIOUS A:   Septic shock - secondary to appendicitis without source control. P:   BCx2 12/2 >>  ABX: Vanc 12/2 >>  Meropenem 12/3 >> Anidulafungin 12/7 >>  Monitor fever curve / WBC  ENDOCRINE A:   Relative adrenal insufficiency.   P:   Hydrocortisone  IV Q8hours SSI to  resistant scale, CBG Q4  NEUROLOGIC A:   ICU Associated Pain  P:   RASS goal: -2 with vent compliance Fentanyl gtt for pain Versed for sedation   FAMILY  - Updates:  No family at bedside.  Continue all current measures.  NO CPR in the event of arrest.  Shock / drugs ok.   Critical care time-35 minutes.  Chilton Greathouse MD [facility] Pager [phone] If no answer or after 3pm call: [phone] 06/03/2015, 11:25  AM     

## 2015-06-03 NOTE — Progress Notes (Signed)
Finish IABP pull, see progress notes.

## 2015-06-03 NOTE — Consult Note (Addendum)
ANTICOAGULATION CONSULT NOTE - Follow Up Consult  Pharmacy Consult for Heparin Indication: IABP  No Known Allergies  Patient Measurements: Height: 5\' 10"  (177.8 cm) Weight: (!) 342 lb 6 oz (155.3 kg) IBW/kg (Calculated) : 73 Heparin Dosing Weight: ~99.5kg  Vital Signs: Temp: 97 F (36.1 C) (12/08 1230) Temp Source: Core (Comment) (12/08 1200) BP: 77/42 mmHg (12/08 1218) Pulse Rate: 83 (12/08 1218)  Labs:  Recent Labs  06/01/15 0125  06/02/15 0410 06/02/15 1335 06/02/15 1600 06/02/15 2215 06/03/15 0416 06/03/15 0600 06/03/15 0805  HGB 11.3*  --  11.4*  --   --   --   --   --  10.5*  HCT 32.2*  --  32.7*  --   --   --   --   --  30.8*  PLT 190  --  182  --   --   --   --   --  278  HEPARINUNFRC  --   < > 1.09* 1.10*  --  0.89*  --  0.91*  --   CREATININE 1.79*  < > 1.73*  1.74*  --  1.47*  --  1.50*  --   --   < > = values in this interval not displayed.  Estimated Creatinine Clearance: 79.4 mL/min (by C-G formula based on Cr of 1.5).   Medical History: Past Medical History  Diagnosis Date  . Kidney calculi   . Hypertension   . History of TIAs   . Arthritis   . Back pain     trouble with lumbar 2, 3, and 4 - degenerative disease per pt  . Unspecified transient cerebral ischemia   . HA (headache)   . Syncope and collapse   . BPH (benign prostatic hyperplasia)   . GERD (gastroesophageal reflux disease)   . Diabetes mellitus without complication (HCC)   . Seizure (HCC)   . BPH (benign prostatic hyperplasia)   . Sarcoidosis of lung (HCC)   . Tobacco abuse    Assessment: Scott Holden presented on 06/04/2015 to [facility] with acute appendicitis and was taken to the OR for a laparoscopic appendectomy. In the OR he went into cardiac arrest. EKG showed CHB and ST elevations- Code STEMI was called. He was transferred to [facility] and taken emergently to the cath lab where he received a BMS to his RCA, temporary pacemaker and IABP were placed. He was started on heparin.  He had also been continued on aggrastat x 18 hours, now stopped.   12/8 > heparin level above goal and dose decreased.  Heparin then turned off in anticipation of IABP pull (not done yet).  Also in afib, so will likely need to resume heparin after IABP out.  Per RN urine is amber, but not bloody appearing.  Goal of Therapy:  Heparin level 0.2-0.5 units/ml Monitor platelets by anticoagulation protocol: Yes   Plan:  1. Heparin currently on hold 2. F/u timeline to resume heparin after IABP.  Tad Moore, BCPS  Clinical Pharmacist Pager [phone]  06/03/2015 1:15 PM   Addendum: Sherron Monday to Dr. Gala Romney > plan to resume heparin 8 hrs after IABP pull.  Was pulled ~ 1330 PM.  Will resume heparin at previous rate of 1000 units/hr.  Check heparin level 8 hrs after gtt resumed.  Tad Moore, BCPS  Clinical Pharmacist Pager [phone]  06/03/2015 3:20 PM

## 2015-06-03 NOTE — Progress Notes (Signed)
ANTICOAGULATION CONSULT NOTE - Follow Up Consult  Pharmacy Consult for heparin Indication: IABP  Labs:  Recent Labs  06/01/15 0125  06/02/15 0410 06/02/15 1335 06/02/15 1600 06/02/15 2215 06/03/15 0416 06/03/15 0600  HGB 11.3*  --  11.4*  --   --   --   --   --   HCT 32.2*  --  32.7*  --   --   --   --   --   PLT 190  --  182  --   --   --   --   --   HEPARINUNFRC  --   < > 1.09* 1.10*  --  0.89*  --  0.91*  CREATININE 1.79*  < > 1.73*  1.74*  --  1.47*  --  1.50*  --   < > = values in this interval not displayed.   Assessment: 59yo male remains above goal on heparin after rate decreases, RN reports NO heparin w/ CRRT, had been requiring high rate prior to CRRT w/ fluid overload.  Goal of Therapy:  Heparin level 0.2-0.5 units/ml   Plan:  Will decrease heparin gtt by 3 units/kgABW/hr to 1000 units/hr and check level in 6hr.  Vernard Gambles, PharmD, BCPS  06/03/2015,6:59 AM

## 2015-06-03 NOTE — Progress Notes (Signed)
eLink Physician-Brief Progress Note Patient Name: Scott Holden DOB: September 30, 1955 MRN: [id number]   Date of Service  06/03/2015  HPI/Events of Note  Shock with cvp 10 pulling neg on cvvh on increasing doses of levophed   eICU Interventions  Add vasopressin, may need to stop pulling neg with cvvh      Intervention Category Major Interventions: Shock - evaluation and management  Sandrea Hughs 06/03/2015, 9:56 PM

## 2015-06-03 NOTE — Progress Notes (Signed)
Patient slightly better overall.  Still on CRRT.  Could not elicit much pain in RLQ.  ARDS.  Heart failure.  ? Sepsis.  Nothing that we can do currently.  Marta Lamas. Gae Bon, MD, FACS [phone] [facility]

## 2015-06-03 NOTE — Progress Notes (Signed)
IABP aspirated and removed from left femoral artery. Manual pressure applied for 30 minutes. Hemostasis achieved.   Groin level 0, no s+s of hematoma. Patient has existing scrotal hernia. Tegaderm dressing applied. Patient sedated and unable to receive bedrest instructions.   Distal pulses present bilaterally dp and pt with doppler probe. Both distal pulses sound stronger on the left than right.  Bedrest begins at 13:35:00

## 2015-06-03 NOTE — Progress Notes (Signed)
ANTIBIOTIC CONSULT NOTE - FOLLOW UP  Pharmacy Consult for Anidulofungin, vancomycin, meropenem Indication: acute appendicitis s/p aborted lap appy  No Known Allergies  Patient Measurements: Height: 5\' 10"  (177.8 cm) Weight: (!) 342 lb 6 oz (155.3 kg) IBW/kg (Calculated) : 73 Adjusted Body Weight: n/a  Vital Signs: Temp: 97 F (36.1 C) (12/08 1230) Temp Source: Core (Comment) (12/08 1200) BP: 77/42 mmHg (12/08 1218) Pulse Rate: 83 (12/08 1218) Intake/Output from previous day: 12/07 0701 - 12/08 0700 In: 4842.4 [I.V.:3892.4; IV Piggyback:650] Out: 01655 [Urine:901; Emesis/NG output:550] Intake/Output from this shift: Total I/O In: 853.7 [I.V.:643.7; NG/GT:80; IV Piggyback:130] Out: 2951 [Urine:63; Other:2888]  Labs:  Recent Labs  06/01/15 0125  06/02/15 0410 06/02/15 1600 06/03/15 0416 06/03/15 0805  WBC 19.4*  --  27.1*  --   --  25.1*  HGB 11.3*  --  11.4*  --   --  10.5*  PLT 190  --  182  --   --  278  CREATININE 1.79*  < > 1.73*  1.74* 1.47* 1.50*  --   < > = values in this interval not displayed. Estimated Creatinine Clearance: 79.4 mL/min (by C-G formula based on Cr of 1.5).  Recent Labs  06/03/15 0600  VANCOTROUGH 14     Microbiology: Recent Results (from the past 720 hour(s))  MRSA PCR Screening     Status: Abnormal   Collection Time: 06-04-15 11:00 PM  Result Value Ref Range Status   MRSA by PCR POSITIVE (A) NEGATIVE Final    Comment:        The GeneXpert MRSA Assay (FDA approved for NASAL specimens only), is one component of a comprehensive MRSA colonization surveillance program. It is not intended to diagnose MRSA infection nor to guide or monitor treatment for MRSA infections. RESULT CALLED TO, READ BACK BY AND VERIFIED WITH: TOMILSON @0159  05/29/15 MKELLY     Anti-infectives    Start     Dose/Rate Route Frequency Ordered Stop   06/03/15 0800  anidulafungin (ERAXIS) 100 mg in sodium chloride 0.9 % 100 mL IVPB     100 mg over 90  Minutes Intravenous Every 24 hours 06/02/15 0730     06/03/15 0600  vancomycin (VANCOCIN) 1,500 mg in sodium chloride 0.9 % 500 mL IVPB     1,500 mg 250 mL/hr over 120 Minutes Intravenous Every 24 hours 06/02/15 0807     06/02/15 1600  meropenem (MERREM) 1 g in sodium chloride 0.9 % 100 mL IVPB  Status:  Discontinued     1 g 200 mL/hr over 30 Minutes Intravenous Every 12 hours 06/02/15 0449 06/02/15 0921   06/02/15 1400  meropenem (MERREM) 1 g in sodium chloride 0.9 % 100 mL IVPB     1 g 200 mL/hr over 30 Minutes Intravenous 3 times per day 06/02/15 0921     06/02/15 0815  vancomycin (VANCOCIN) 500 mg in sodium chloride 0.9 % 100 mL IVPB     500 mg 100 mL/hr over 60 Minutes Intravenous NOW 06/02/15 0807 06/02/15 1020   06/02/15 0800  anidulafungin (ERAXIS) 200 mg in sodium chloride 0.9 % 200 mL IVPB     200 mg over 180 Minutes Intravenous  Once 06/02/15 0730 06/02/15 1139   05/30/15 0600  vancomycin (VANCOCIN) 1,250 mg in sodium chloride 0.9 % 250 mL IVPB  Status:  Discontinued     1,250 mg 166.7 mL/hr over 90 Minutes Intravenous Every 24 hours 05/29/15 0237 06/02/15 0807   05/29/15 1200  meropenem (MERREM) 1  g in sodium chloride 0.9 % 100 mL IVPB  Status:  Discontinued     1 g 200 mL/hr over 30 Minutes Intravenous Every 8 hours 05/29/15 0237 06/02/15 0449   05/29/15 0600  piperacillin-tazobactam (ZOSYN) IVPB 3.375 g  Status:  Discontinued     3.375 g 12.5 mL/hr over 240 Minutes Intravenous 3 times per day 06/18/2015 2256 05/29/15 0216   05/29/15 0245  vancomycin (VANCOCIN) 2,500 mg in sodium chloride 0.9 % 500 mL IVPB     2,500 mg 250 mL/hr over 120 Minutes Intravenous  Once 05/29/15 0237 05/29/15 0612   05/29/15 0245  meropenem (MERREM) 1 g in sodium chloride 0.9 % 100 mL IVPB     1 g 200 mL/hr over 30 Minutes Intravenous  Once 05/29/15 0237 05/29/15 0339   06/12/2015 2315  piperacillin-tazobactam (ZOSYN) IVPB 3.375 g     3.375 g 100 mL/hr over 30 Minutes Intravenous  Once 05/27/2015  2300 05/29/15 0124   06/01/2015 0830  cefTRIAXone (ROCEPHIN) 2 g in dextrose 5 % 50 mL IVPB  Status:  Discontinued     2 g 100 mL/hr over 30 Minutes Intravenous Every 24 hours 06/21/2015 0815 06/24/2015 2256   05/29/2015 0830  metroNIDAZOLE (FLAGYL) IVPB 500 mg  Status:  Discontinued     500 mg 100 mL/hr over 60 Minutes Intravenous Every 8 hours 06/24/2015 0815 06/18/2015 2256   06/19/2015 0715  piperacillin-tazobactam (ZOSYN) IVPB 3.375 g     3.375 g 12.5 mL/hr over 240 Minutes Intravenous  Once 06/03/2015 0700 06/04/2015 2956      Assessment: 59 yo male with acute appendicitis s/p aborted lap appy due to VF arrest.  WBC elevated to 25.1, also receiving steroids.  Previously febrile, but now hypothermic.  Currently on CRRT.    Vancomycin trough = 14 this AM, prior to dose  Vanc 12/3 >> Merrem 12/3 >> Anidulofungin 12/7 >>  No cultures.  Goal of Therapy:  Vancomycin trough level 15-20 mcg/ml  Plan:  1. Continue vancomycin 1500 mg IV q 24 hrs.   2. Meropenem 1g IV q 8 hrs 3. Anidulafungin 100 mg q 24 hrs 4. Will monitor for interruptions or discontinuation of CRRT and adjust antibiotics as needed.  Tad Moore, BCPS  Clinical Pharmacist Pager [phone]  06/03/2015 1:26 PM

## 2015-06-03 NOTE — Progress Notes (Signed)
[facility] Rounding Note  Subjective:  CRRT initiated 12/6 - tolerating 200-300 per hour off Weight down about 5 kg UOP continues to trail off but still made just under a liter yesterday IABP 1:2 Oozing from HD cath site High heparin levels (no heparin with CRRT - systemic only) pharmacy adjusting  Objective:    Vital signs in last 24 hours: Filed Vitals:   06/03/15 0500 06/03/15 0559 06/03/15 0600 06/03/15 0700  BP: 102/64  112/65 100/63  Pulse:      Temp: 95.7 F (35.4 C) 95.7 F (35.4 C) 95.7 F (35.4 C)   TempSrc:      Resp:      Height:      Weight:  155.3 kg (342 lb 6 oz)    SpO2: 97% 99% 98%    Weight change: -6.4 kg (-14 lb 1.8 oz)  Intake/Output Summary (Last 24 hours) at 06/03/15 0721 Last data filed at 06/03/15 0700  Gross per 24 hour  Intake 4841.43 ml  Output  16109 ml  Net -6556.57 ml   CVP 18 PAP 46/34 PCWP 20 CI 2.7  Physical Exam:  Blood pressure 100/63, pulse 89, temperature 95.7 F (35.4 C), temperature source Core (Comment), resp. rate 25, height  (1.778 m), weight 155.3 kg (342 lb 6 oz), SpO2 98 %.  General appearance: Intubated and sedated, morbidly obese.  Multiple lines, ex defibrillator, IABP Gross anasarca Eyes open Resp: Anteriorly fairly clear Cardio: Very distant, no rub, regular rhythm GI: obese, distended/tense, absent bowel sounds. IABP audible. S Extremities: edema 2+ anasarca to abdomen/ Massive scrotal edema R fem dialysis catheter oozing Neurologic: Mental status: sedated and unresponsive bur RN says responds when sedation lightened  Weight trending: 12/2 119.25 kg 12/6 160 kg  CRRT started (late PM) 12/7 161.7 kg 12/8  155.3  Labs:   Recent Labs Lab 05/31/15 1715 06/01/15 0125 06/01/15 1122 06/01/15 1836 06/02/15 0410 06/02/15 1600 06/03/15 0416  NA 124* 125* 126* 124* 126*  126* 129* 129*  K 2.8* 3.2* 3.3* 3.8 4.0  4.0 4.0 4.1  CL 83* 87* 84* 83* 86*  86* 90* 90*  CO2 34* GLUCOSE 171* 160* 145* 135* 162*  160* 154* 150*  BUN 34* 39* 41* 45* 45*  45* 40* 38*  CREATININE 1.81* 1.79* 1.67* 1.85* 1.73*  1.74* 1.47* 1.50*  CALCIUM 6.1* 5.9* 6.0* 6.1* 6.2*  6.2* 6.4* 6.5*  PHOS  --   --   --   --  4.8* 4.4 4.4     Recent Labs Lab 06-25-2015 0445 06-25-15 1852 06/02/15 0410 06/02/15 1600 06/03/15 0416  AST 20 25  --   --   --   ALT 17 16*  --   --   --   ALKPHOS 86 60  --   --   --   BILITOT 0.7 1.3*  --   --   --   PROT 7.1 5.5*  --   --   --   ALBUMIN 4.1 3.0* 1.7* 1.8* 1.8*    Recent Labs Lab 06-25-2015 0445  LIPASE 21     Recent Labs Lab 2015-06-25 1852  05/30/15 0315 05/31/15 0145 06/01/15 0125 06/02/15 0410  WBC 17.1*  < > 18.8* 17.9* 19.4* 27.1*  NEUTROABS 15.4*  --   --   --   --   --   HGB 13.4  < > 12.9* 12.1* 11.3* 11.4*  HCT 40.8  < >  38.6* 34.5* 32.2* 32.7*  MCV 91.7  < > 88.7 83.9 82.6 85.2  PLT 209  < > 220 195 190 182  < > = values in this interval not displayed.     Recent Labs Lab 05/29/2015 1852 06/05/2015 1853 05/27/2015 2309 05/29/15 0445 05/29/15 1000  CKTOTAL  --  49  --   --   --   CKMB  --  8.9*  --   --   --   TROPONINI 0.65*  --  >65.00* >65.00* >65.00*     Recent Labs Lab 06/02/15 1258 06/02/15 1556 06/02/15 1957 06/03/15 0013 06/03/15 0408  GLUCAP 142* 144* 129* 133* 142*    Medications . sodium chloride 10 mL/hr at 06/02/15 1952  . sodium chloride 10 mL/hr (06/02/15 1800)  . amiodarone 60 mg/hr (06/03/15 0456)  . DOPamine 2.502 mcg/kg/min (06/02/15 1600)  . fentaNYL infusion INTRAVENOUS 200 mcg/hr (06/03/15 0209)  . heparin 1,400 Units/hr (06/03/15 0457)  . midazolam (VERSED) infusion 4 mg/hr (06/02/15 1854)  . milrinone 0.375 mcg/kg/min (06/03/15 0228)  . norepinephrine (LEVOPHED) Adult infusion 28 mcg/min (06/03/15 0500)  . dialysis replacement fluid (prismasate) 500 mL/hr at 06/02/15 2117  . dialysis replacement fluid (prismasate) 300 mL/hr at 06/02/15 0127  .  dialysate (PRISMASATE) 1,000 mL/hr at 06/03/15 0224  . vasopressin (PITRESSIN) infusion - *FOR SHOCK* Stopped (06/01/15 1832)   . anidulafungin  100 mg Intravenous Q24H  . antiseptic oral rinse  7 mL Mouth Rinse 10 times per day  . aspirin  81 mg Per Tube Daily  . atorvastatin  80 mg Per NG tube q1800  . chlorhexidine gluconate  15 mL Mouth Rinse BID  . Chlorhexidine Gluconate Cloth  6 each Topical Q0600  . clopidogrel  75 mg Per NG tube Daily  . fentaNYL (SUBLIMAZE) injection  50 mcg Intravenous Once  . hydrocortisone sod succinate (SOLU-CORTEF) inj  100 mg Intravenous Q8H  . Influenza vac split quadrivalent PF  0.5 mL Intramuscular Tomorrow-1000  . insulin aspart  0-20 Units Subcutaneous 6 times per day  . meropenem (MERREM) IV  1 g Intravenous 3 times per day  . pantoprazole sodium  40 mg Per Tube Daily  . sodium chloride  10-40 mL Intracatheter Q12H  . sodium chloride  3 mL Intravenous Q12H  . vancomycin  1,500 mg Intravenous Q24H    Background: 59 yo M with PMH sig for HTN, DM, nephrolithiasis, tobacco abuse, and TIA's who presented to [facility] on 06/11/2015 with right sided abdominal pain and was found to have acute appendicitis. He went for surgery, however after the first trocar was introduced, he suffered a VT cardiac arrest on the table (surgery aborted). He was transferred to [facility] after a code STEMI was called. Had cath with DES to RCA and  insertion of an IABP for MI and cardiogenic shock, started on pressors and transferred to CCU. Scr peaked at 2.24 following cath and was improving until 12/6 when his UOP dropped, O2 demands increased. Noted to be 40L +. We were consulted to further evaluate ARF and provide CRRT for volume management. In addition CT scan revealed hydro of right ureter with evidence of 6x84mm calculus with bilateral  hydronephrosis (marked hydro on R).   Assessment/Recommendations 1. ARF, non-oliguric in setting of cardiogenic shock/sepsis and IV contrast as  well as hydronephrosis and obstructing stone in right ureter. CRRT initiated 12/6. Volume 40 kg up prior to CRRT initiation. Tolerating fluid removal at neg 200-300/hour. Weight down about 5 kg if believable.  K and phos both look OK.  Still mild hyponatremia but improved from admission.  2. Nephrolithiasis with obstructive uropathy/right stone but bilateral hydronephrosis - too unstable for any procedure at this time. Continue with supportive measures  3. Acute inferior STEMI with RV involvement/acute systolic heart failure/RV infarct/VF arrest- s/p PTCA and DES placement in RCA 12/2 complicated by cardiogenic shock requiring  IABP and pressors.    4. Acute appendicitis with gangrenous changes but unable to remove due to his cardiac arrest and critical illness. Continue with antibiotics and pressors  5. VDRF with increasing oxygen requirements and massive  volume overload.Remains on 100% FIO2  6. SIRS/multi-organ failure- cardiogenic and sepsis. On pressors, antibiotics, and IABP. WBC continues to rise. ATB's = vanco/meropenem/anidulafungin.  I do not see any cultures?  7. HEME- on heparin gtt and pharmacy adjusting.   8. MRSA PCR +   Camille Bal, MD [facility] [phone] Pager 06/03/2015, 7:21 AM

## 2015-06-03 NOTE — Progress Notes (Signed)
Start of IABP pull

## 2015-06-03 NOTE — Progress Notes (Signed)
Advanced Heart Failure Rounding Note   Subjective:    Remains intubated/sedated on CVVHD. Pulling 200-300cc/day. Down 6L overnight. Minimal u/o.    VT/VF quiescent for several days. Staying in NSR on amio. On vanc, meropenum, and fungal coverage started today.   Weaning pressors now on levophed 28 and milrinone 0.375. Dopamine and vasopressin off. IABP remains 1:2  Swan numbers this am.  RA 15 PA 38/25 PCWP 20 Thermo 6.9/2.7   Objective:   Weight Range:  Vital Signs:   Temp:  [95.5 F (35.3 C)-97.3 F (36.3 C)] 96.8 F (36 C) (12/08 1600) Pulse Rate:  [82-89] 89 (12/08 1539) Resp:  [9-22] 9 (12/08 1600) BP: (77-114)/(42-94) 101/94 mmHg (12/08 1539) SpO2:  [90 %-100 %] 93 % (12/08 1600) Arterial Line BP: (82-145)/(43-94) 100/57 mmHg (12/08 1600) FiO2 (%):  [70 %-100 %] 70 % (12/08 1600) Weight:  [342 lb 6 oz (155.3 kg)] 342 lb 6 oz (155.3 kg) (12/08 0559) Last BM Date: 05/27/15  Weight change: Filed Weights   06/01/15 0400 06/02/15 0422 06/03/15 0559  Weight: 352 lb 11.8 oz (160 kg) 356 lb 7.7 oz (161.7 kg) 342 lb 6 oz (155.3 kg)    Intake/Output:   Intake/Output Summary (Last 24 hours) at 06/03/15 1644 Last data filed at 06/03/15 1600  Gross per 24 hour  Intake 4255.2 ml  Output  16109 ml  Net -6648.8 ml     Physical Exam: General:  Intubated sedated. HEENT: normal x for ETT Neck: supple. LIJ  RIJ swan Carotids 2+ bilat;  No lymphadenopathy or thryomegaly appreciated. Cor: PMI nonpalpable. Regular rate & rhythm. No rubs, gallops or murmurs. Lungs: clear anteriorly Abdomen: Markedly obese. Soft. Hypoactive BS Extremities: left femoral IABP no cyanosis, clubbing, rash, 3-4+ edema. HD catheter in R groin.  Neuro: sedated no focal  Telemetry: NSR 90-100s  Labs: Basic Metabolic Panel:  Recent Labs Lab 05/31/15 0145  05/31/15 1715  06/01/15 1122 06/01/15 1836 06/02/15 0410 06/02/15 1600 06/03/15 0416  NA 127*  < > 124*  < > 126* 124* 126*   126* 129* 129*  K 2.9*  < > 2.8*  < > 3.3* 3.8 4.0  4.0 4.0 4.1  CL 87*  < > 83*  < > 84* 83* 86*  86* 90* 90*  CO2 29  < > 34*  < > GLUCOSE 179*  < > 171*  < > 145* 135* 162*  160* 154* 150*  BUN 34*  < > 34*  < > 41* 45* 45*  45* 40* 38*  CREATININE 1.83*  < > 1.81*  < > 1.67* 1.85* 1.73*  1.74* 1.47* 1.50*  CALCIUM 6.1*  < > 6.1*  < > 6.0* 6.1* 6.2*  6.2* 6.4* 6.5*  MG 1.8  --  1.8  --  2.1  --  2.0  --  2.3  PHOS  --   --   --   --   --   --  4.8* 4.4 4.4  < > = values in this interval not displayed.  Liver Function Tests:  Recent Labs Lab 06/07/2015 0445 05/27/2015 1852 06/02/15 0410 06/02/15 1600 06/03/15 0416  AST 20 25  --   --   --   ALT 17 16*  --   --   --   ALKPHOS 86 60  --   --   --   BILITOT 0.7 1.3*  --   --   --  PROT 7.1 5.5*  --   --   --   ALBUMIN 4.1 3.0* 1.7* 1.8* 1.8*    Recent Labs Lab 06/23/2015 0445  LIPASE 21   No results for input(s): AMMONIA in the last 168 hours.  CBC:  Recent Labs Lab 06/14/2015 1852  05/30/15 0315 05/31/15 0145 06/01/15 0125 06/02/15 0410 06/03/15 0805  WBC 17.1*  < > 18.8* 17.9* 19.4* 27.1* 25.1*  NEUTROABS 15.4*  --   --   --   --   --  22.5*  HGB 13.4  < > 12.9* 12.1* 11.3* 11.4* 10.5*  HCT 40.8  < > 38.6* 34.5* 32.2* 32.7* 30.8*  MCV 91.7  < > 88.7 83.9 82.6 85.2 84.8  PLT 209  < > 220 195 190 182 278  < > = values in this interval not displayed.  Cardiac Enzymes:  Recent Labs Lab 05/29/2015 1852 06/11/2015 1853 06/17/2015 2309 05/29/15 0445 05/29/15 1000  CKTOTAL  --  49  --   --   --   CKMB  --  8.9*  --   --   --   TROPONINI 0.65*  --  >65.00* >65.00* >65.00*    BNP: BNP (last 3 results) No results for input(s): BNP in the last 8760 hours.  ProBNP (last 3 results) No results for input(s): PROBNP in the last 8760 hours.    Other results:  Imaging: No results found.   Medications:     Scheduled Medications: . anidulafungin  100 mg Intravenous Q24H  . antiseptic  oral rinse  7 mL Mouth Rinse 10 times per day  . aspirin  81 mg Per Tube Daily  . atorvastatin  80 mg Per NG tube q1800  . chlorhexidine gluconate  15 mL Mouth Rinse BID  . Chlorhexidine Gluconate Cloth  6 each Topical Q0600  . clopidogrel  75 mg Per NG tube Daily  . fentaNYL (SUBLIMAZE) injection  50 mcg Intravenous Once  . hydrocortisone sod succinate (SOLU-CORTEF) inj  100 mg Intravenous Q8H  . Influenza vac split quadrivalent PF  0.5 mL Intramuscular Tomorrow-1000  . insulin aspart  0-20 Units Subcutaneous 6 times per day  . meropenem (MERREM) IV  1 g Intravenous 3 times per day  . pantoprazole sodium  40 mg Per Tube Daily  . sodium chloride  10-40 mL Intracatheter Q12H  . sodium chloride  3 mL Intravenous Q12H  . vancomycin  1,500 mg Intravenous Q24H    Infusions: . sodium chloride 0 mL/hr at 06/03/15 1600  . sodium chloride 10 mL/hr at 06/03/15 1500  . amiodarone 60 mg/hr (06/03/15 1601)  . DOPamine Stopped (06/03/15 0805)  . fentaNYL infusion INTRAVENOUS 200 mcg/hr (06/03/15 1430)  . heparin    . midazolam (VERSED) infusion 4 mg/hr (06/02/15 1854)  . milrinone 0.375 mcg/kg/min (06/03/15 1430)  . norepinephrine (LEVOPHED) Adult infusion 24 mcg/min (06/03/15 1000)  . dialysis replacement fluid (prismasate) 500 mL/hr at 06/03/15 0730  . dialysis replacement fluid (prismasate) 300 mL/hr at 06/03/15 1104  . dialysate (PRISMASATE) 1,000 mL/hr at 06/03/15 1225  . vasopressin (PITRESSIN) infusion - *FOR SHOCK* Stopped (06/01/15 1832)    PRN Medications: sodium chloride, fentaNYL, heparin, heparin, midazolam, midazolam, morphine injection, ondansetron **OR** ondansetron (ZOFRAN) IV, simethicone, sodium chloride, sodium chloride   Assessment:   1. Acute systolic HF with cardiogenic shock with biventricular dysfunction R>L 2. Acute inferior STEMI with RV involvement   --s/p PCI RCA on 12/2 3. Acute respiratory failure 4. AKI 5. Acute appendicitis being treated  medically 6.  Recurrent VT/VF 7. New onset AF 8. Hypomagnesemia 9. Hypocalcemia 10. Hypokalemia 11. Acute Renal Failure 12. Hyponatremia  Plan/Discussion:    Volume status improving with CVVHD but still massively volume overloaded. Will continue to pull. We have a long way to go. Now essentially anuric.   Hemodynamics improving. Will pull IABP and swan today. Continue to wean pressors.   WBC decreasing. Belly not acute. Will continue abx and fungal coverage.  Continue amio and heparin for AF. On DAPT for recent stent.   The patient is critically ill with multiple organ systems failure and requires high complexity decision making for assessment and support, frequent evaluation and titration of therapies, application of advanced monitoring technologies and extensive interpretation of multiple databases.   Critical Care Time devoted to patient care services described in this note is 45 Minutes  Adonai Selsor,MD 4:44 PM   Length of Stay: 6   06/03/2015, 4:44 PM  Advanced Heart Failure Team Pager [phone] (M-F; 7a - 4p)  Please contact [facility] for night-coverage after hours (4p -7a ) and weekends on [email or web address]    .

## 2015-06-04 LAB — BLOOD GAS, ARTERIAL
ACID-BASE DEFICIT: 0.3 mmol/L (ref 0.0–2.0)
BICARBONATE: 24.7 meq/L — AB (ref 20.0–24.0)
FIO2: 0.6
O2 Saturation: 98.3 %
PATIENT TEMPERATURE: 98.6
PEEP/CPAP: 16 cmH2O
PH ART: 7.342 — AB (ref 7.350–7.450)
PO2 ART: 120 mmHg — AB (ref 80.0–100.0)
PRESSURE CONTROL: 20 cmH2O
RATE: 20 resp/min
TCO2: 26.2 mmol/L (ref 0–100)
pCO2 arterial: 46.9 mmHg — ABNORMAL HIGH (ref 35.0–45.0)

## 2015-06-04 LAB — RENAL FUNCTION PANEL
ALBUMIN: 1.7 g/dL — AB (ref 3.5–5.0)
ANION GAP: 6 (ref 5–15)
Albumin: 2.1 g/dL — ABNORMAL LOW (ref 3.5–5.0)
Anion gap: 9 (ref 5–15)
BUN: 37 mg/dL — ABNORMAL HIGH (ref 6–20)
BUN: 37 mg/dL — AB (ref 6–20)
CALCIUM: 7 mg/dL — AB (ref 8.9–10.3)
CO2: 29 mmol/L (ref 22–32)
CO2: 27 mmol/L (ref 22–32)
CREATININE: 1.85 mg/dL — AB (ref 0.61–1.24)
Calcium: 6.7 mg/dL — ABNORMAL LOW (ref 8.9–10.3)
Chloride: 96 mmol/L — ABNORMAL LOW (ref 101–111)
Chloride: 97 mmol/L — ABNORMAL LOW (ref 101–111)
Creatinine, Ser: 1.8 mg/dL — ABNORMAL HIGH (ref 0.61–1.24)
GFR calc Af Amer: 46 mL/min — ABNORMAL LOW (ref >60)
GFR, EST AFRICAN AMERICAN: 44 mL/min — AB (ref >60)
GFR, EST NON AFRICAN AMERICAN: 40 mL/min — AB (ref >60)
GFR, EST NON AFRICAN AMERICAN: 38 mL/min — AB (ref >60)
GLUCOSE: 132 mg/dL — AB (ref 65–99)
Glucose, Bld: 150 mg/dL — ABNORMAL HIGH (ref 65–99)
PHOSPHORUS: 4.2 mg/dL (ref 2.5–4.6)
POTASSIUM: 4.4 mmol/L (ref 3.5–5.1)
Phosphorus: 4.1 mg/dL (ref 2.5–4.6)
Potassium: 4.6 mmol/L (ref 3.5–5.1)
SODIUM: 133 mmol/L — AB (ref 135–145)
Sodium: 131 mmol/L — ABNORMAL LOW (ref 135–145)

## 2015-06-04 LAB — GLUCOSE, CAPILLARY
GLUCOSE-CAPILLARY: 134 mg/dL — AB (ref 65–99)
GLUCOSE-CAPILLARY: 135 mg/dL — AB (ref 65–99)
GLUCOSE-CAPILLARY: 145 mg/dL — AB (ref 65–99)
Glucose-Capillary: 100 mg/dL — ABNORMAL HIGH (ref 65–99)
Glucose-Capillary: 153 mg/dL — ABNORMAL HIGH (ref 65–99)
Glucose-Capillary: 126 mg/dL — ABNORMAL HIGH (ref 65–99)
Glucose-Capillary: 120 mg/dL — ABNORMAL HIGH (ref 65–99)

## 2015-06-04 LAB — BASIC METABOLIC PANEL
ANION GAP: 4 — AB (ref 5–15)
BUN: 38 mg/dL — ABNORMAL HIGH (ref 6–20)
CHLORIDE: 97 mmol/L — AB (ref 101–111)
CO2: 29 mmol/L (ref 22–32)
CREATININE: 1.81 mg/dL — AB (ref 0.61–1.24)
Calcium: 6.7 mg/dL — ABNORMAL LOW (ref 8.9–10.3)
GFR calc non Af Amer: 39 mL/min — ABNORMAL LOW (ref >60)
GFR, EST AFRICAN AMERICAN: 45 mL/min — AB (ref >60)
Glucose, Bld: 149 mg/dL — ABNORMAL HIGH (ref 65–99)
Potassium: 4.5 mmol/L (ref 3.5–5.1)
Sodium: 130 mmol/L — ABNORMAL LOW (ref 135–145)

## 2015-06-04 LAB — HEPARIN LEVEL (UNFRACTIONATED)
HEPARIN UNFRACTIONATED: 0.2 [IU]/mL — AB (ref 0.30–0.70)
Heparin Unfractionated: 0.5 IU/mL (ref 0.30–0.70)
Heparin Unfractionated: 0.78 IU/mL — ABNORMAL HIGH (ref 0.30–0.70)

## 2015-06-04 LAB — CBC
HCT: 29.2 % — ABNORMAL LOW (ref 39.0–52.0)
HEMOGLOBIN: 9.9 g/dL — AB (ref 13.0–17.0)
MCH: 29.6 pg (ref 26.0–34.0)
MCHC: 33.9 g/dL (ref 30.0–36.0)
MCV: 87.2 fL (ref 78.0–100.0)
Platelets: 179 10*3/uL (ref 150–400)
RBC: 3.35 MIL/uL — AB (ref 4.22–5.81)
RDW: 15.2 % (ref 11.5–15.5)
WBC: 22.2 10*3/uL — AB (ref 4.0–10.5)

## 2015-06-04 LAB — CARBOXYHEMOGLOBIN
CARBOXYHEMOGLOBIN: 0.9 % (ref 0.5–1.5)
Methemoglobin: 1 % (ref 0.0–1.5)
O2 SAT: 63.2 %
Total hemoglobin: 8.7 g/dL — ABNORMAL LOW (ref 13.5–18.0)

## 2015-06-04 LAB — MAGNESIUM: MAGNESIUM: 2.4 mg/dL (ref 1.7–2.4)

## 2015-06-04 MED ORDER — ALBUMIN HUMAN 25 % IV SOLN
25.0000 g | Freq: Four times a day (QID) | INTRAVENOUS | Status: AC
  Administered 2015-06-04 – 2015-06-05 (×6): 25 g via INTRAVENOUS
  Filled 2015-06-04 (×8): qty 100

## 2015-06-04 NOTE — Progress Notes (Signed)
Responded to code blue . No family presence.  Patient recovered.  Will follow as needed.

## 2015-06-04 NOTE — Progress Notes (Signed)
[facility] Progress Note 7 Days Post-Op  Subjective: Patient still very sedated on the ventilator.  Still on two pressors, but IABP is out.  FIO2 down to 60%, but still on PEEP +18.  Objective: Vital signs in last 24 hours: Temp:  [95.5 F (35.3 C)-98.4 F (36.9 C)] 98.1 F (36.7 C) (12/09 0500) Pulse Rate:  [82-94] 90 (12/09 0731) Resp:  [10-32] 20 (12/09 0731) BP: (77-117)/(42-94) 117/59 mmHg (12/09 0731) SpO2:  [90 %-99 %] 95 % (12/09 0731) Arterial Line BP: (82-168)/(41-94) 87/50 mmHg (12/09 0600) FiO2 (%):  [60 %-80 %] 60 % (12/09 0731) Weight:  [150.5 kg (331 lb 12.7 oz)] 150.5 kg (331 lb 12.7 oz) (12/09 0407) Last BM Date: 05/27/15  Intake/Output from previous day: 12/08 0701 - 12/09 0700 In: 4083.9 [I.V.:3243.9; NG/GT:160; IV Piggyback:680] Out: 16109 [Urine:160; Emesis/NG output:550] Intake/Output this shift:    General: No distress.  Sedated.    Lungs: Clear  Abd: No bowel sounds.  Does not grimace when palpated in the RLQ.  Extremities: No changes  Neuro: Sedated.  Lab Results:  (wbc:2,hgb:2,hct:2,plt:2) BMET ) Recent Labs  06/03/15 1603 06/04/15 0310  NA 131* 130*  131*  K 4.1 4.5  4.4  CL 95* 97*  96*  CO2 GLUCOSE 116* 149*  150*  BUN 38* 38*  37*  CREATININE 1.59* 1.81*  1.80*  CALCIUM 6.5* 6.7*  6.7*   PT/INR No results for input(s): LABPROT, INR in the last 72 hours. ABG  Recent Labs  06/02/15 0350 06/03/15 0900  PHART 7.362 7.386  HCO3 29.8* 25.3*    Studies/Results: No results found.  Anti-infectives: Anti-infectives    Start     Dose/Rate Route Frequency Ordered Stop   06/03/15 0800  anidulafungin (ERAXIS) 100 mg in sodium chloride 0.9 % 100 mL IVPB     100 mg over 90 Minutes Intravenous Every 24 hours 06/02/15 0730     06/03/15 0600  vancomycin (VANCOCIN) 1,500 mg in sodium chloride 0.9 % 500 mL IVPB     1,500 mg 250 mL/hr over 120 Minutes Intravenous Every 24 hours 06/02/15 0807     06/02/15  1600  meropenem (MERREM) 1 g in sodium chloride 0.9 % 100 mL IVPB  Status:  Discontinued     1 g 200 mL/hr over 30 Minutes Intravenous Every 12 hours 06/02/15 0449 06/02/15 0921   06/02/15 1400  meropenem (MERREM) 1 g in sodium chloride 0.9 % 100 mL IVPB     1 g 200 mL/hr over 30 Minutes Intravenous 3 times per day 06/02/15 0921     06/02/15 0815  vancomycin (VANCOCIN) 500 mg in sodium chloride 0.9 % 100 mL IVPB     500 mg 100 mL/hr over 60 Minutes Intravenous NOW 06/02/15 0807 06/02/15 1020   06/02/15 0800  anidulafungin (ERAXIS) 200 mg in sodium chloride 0.9 % 200 mL IVPB     200 mg over 180 Minutes Intravenous  Once 06/02/15 0730 06/02/15 1139   05/30/15 0600  vancomycin (VANCOCIN) 1,250 mg in sodium chloride 0.9 % 250 mL IVPB  Status:  Discontinued     1,250 mg 166.7 mL/hr over 90 Minutes Intravenous Every 24 hours 05/29/15 0237 06/02/15 0807   05/29/15 1200  meropenem (MERREM) 1 g in sodium chloride 0.9 % 100 mL IVPB  Status:  Discontinued     1 g 200 mL/hr over 30 Minutes Intravenous Every 8 hours 05/29/15 0237 06/02/15 0449   05/29/15 0600  piperacillin-tazobactam (ZOSYN) IVPB  3.375 g  Status:  Discontinued     3.375 g 12.5 mL/hr over 240 Minutes Intravenous 3 times per day 06/18/2015 2256 05/29/15 0216   05/29/15 0245  vancomycin (VANCOCIN) 2,500 mg in sodium chloride 0.9 % 500 mL IVPB     2,500 mg 250 mL/hr over 120 Minutes Intravenous  Once 05/29/15 0237 05/29/15 0612   05/29/15 0245  meropenem (MERREM) 1 g in sodium chloride 0.9 % 100 mL IVPB     1 g 200 mL/hr over 30 Minutes Intravenous  Once 05/29/15 0237 05/29/15 0339   06/02/2015 2315  piperacillin-tazobactam (ZOSYN) IVPB 3.375 g     3.375 g 100 mL/hr over 30 Minutes Intravenous  Once 06/25/2015 2300 05/29/15 0124   05/29/2015 0830  cefTRIAXone (ROCEPHIN) 2 g in dextrose 5 % 50 mL IVPB  Status:  Discontinued     2 g 100 mL/hr over 30 Minutes Intravenous Every 24 hours 06/07/2015 0815 06/25/2015 2256   06/11/2015 0830  metroNIDAZOLE  (FLAGYL) IVPB 500 mg  Status:  Discontinued     500 mg 100 mL/hr over 60 Minutes Intravenous Every 8 hours 06/26/2015 0815 06/16/2015 2256   05/27/2015 0715  piperacillin-tazobactam (ZOSYN) IVPB 3.375 g     3.375 g 12.5 mL/hr over 240 Minutes Intravenous  Once 06/01/2015 0700 06/15/2015 0907      Assessment/Plan: s/p Procedure(s): Left Heart Cath IABP Insertion Temporary Pacemaker Patient still on CRRT and will need that for a while.  At a point in time when the patient is stable enough for transport, a when organized trip to CT for an abdominal/pelvic CT with oral contrast (through OGT) only should be done to see if the patient has an abscess that needs to be drained.  I still would not consider taking this patient back to surgery. Continue antibiotics  LOS: 7 days   Marta Lamas. Gae Bon, MD, FACS [phone] [facility] 06/04/2015

## 2015-06-04 NOTE — Progress Notes (Signed)
PULMONARY / CRITICAL CARE MEDICINE   Name: Scott Holden MRN: [id number] DOB: 07-01-1955    ADMISSION DATE:  05/27/2015 CONSULTATION DATE:  06/25/2015   REFERRING MD :  Dr. Clifton James  CHIEF COMPLAINT:  Cardiogenic and septic shock  INITIAL PRESENTATION:  69M presented to [facility] with several days of progressively worsening RLQ pain found to have an acute appendicitis.  He went to the OR and suffered a VT arrest on the table after the insertion of the trochar.  He was transferred to [facility] after a code STEMI was called and the surgical intervention was aborted.  In the cath lab an RCA DES was placed and an IABP inserted for MI and cardiogenic shock.  While in cath lab, he required shock x6 for VF arrest and post return to ICU, shock x5.     SUBJECTIVE: Continues to be on Levophed, vaso and amiodarone. Continues on CVVH with 200-300 mL off per hour. Balloon pump out.  VITAL SIGNS: Temp:  [95.9 F (35.5 C)-98.4 F (36.9 C)] 97 F (36.1 C) (12/09 1000) Pulse Rate:  [83-94] 90 (12/09 0731) Resp:  [10-32] 20 (12/09 1000) BP: (77-117)/(42-94) 117/59 mmHg (12/09 0731) SpO2:  [90 %-99 %] 93 % (12/09 1000) Arterial Line BP: (82-168)/(41-79) 124/57 mmHg (12/09 1000) FiO2 (%):  [60 %-80 %] 60 % (12/09 1000) Weight:  [331 lb 12.7 oz (150.5 kg)] 331 lb 12.7 oz (150.5 kg) (12/09 0407)   HEMODYNAMICS: PAP: (34-63)/(22-35) 34/27 mmHg CVP:  [10 mmHg-11 mmHg] 11 mmHg PCWP:  [15 mmHg] 15 mmHg CO:  [7.7 L/min] 7.7 L/min CI:  [3 L/min/m2] 3 L/min/m2   VENTILATOR SETTINGS: Vent Mode:  [-] PCV FiO2 (%):  [60 %-80 %] 60 % Set Rate:  [20 bmp] 20 bmp PEEP:  [18 cmH20] 18 cmH20 Plateau Pressure:  [30 cmH20-35 cmH20] 35 cmH20   INTAKE / OUTPUT:  Intake/Output Summary (Last 24 hours) at 06/04/15 1132 Last data filed at 06/04/15 1100  Gross per 24 hour  Intake 4188.3 ml  Output  40981 ml  Net -6658.7 ml    PHYSICAL EXAMINATION: General:  Obese male, critically ill Neuro:  Sedated,  unresponsive HEENT:  ETT tube in place, moist mucous membranes Cardiovascular:  Regular rate and rhythm  Lungs:  Bilateral rhonchi, nonlabored Abdomen:  Soft, tender Musculoskeletal:   Normal tone, no acute deformities  Skin:  Generalized edema  LABS:  CBC  Recent Labs Lab 06/02/15 0410 06/03/15 0805 06/04/15 0310  WBC 27.1* 25.1* 22.2*  HGB 11.4* 10.5* 9.9*  HCT 32.7* 30.8* 29.2*  PLT 182 278 179   Coag's No results for input(s): APTT, INR in the last 168 hours.   BMET  Recent Labs Lab 06/03/15 0416 06/03/15 1603 06/04/15 0310  NA 129* 131* 130*  131*  K 4.1 4.1 4.5  4.4  CL 90* 95* 97*  96*  CO2 BUN 38* 38* 38*  37*  CREATININE 1.50* 1.59* 1.81*  1.80*  GLUCOSE 150* 116* 149*  150*   Electrolytes  Recent Labs Lab 06/02/15 0410  06/03/15 0416 06/03/15 1603 06/04/15 0310  CALCIUM 6.2*  6.2*  < > 6.5* 6.5* 6.7*  6.7*  MG 2.0  --  2.3  --  2.4  PHOS 4.8*  < > 4.4 4.0 4.2  < > = values in this interval not displayed. Sepsis Markers  Recent Labs Lab 05/29/15 0314 05/29/15 0457 05/29/15 1456  LATICACIDVEN 6.95* 6.9* 3.9*   ABG  Recent Labs Lab  06/01/15 2111 06/02/15 0350 06/03/15 0900  PHART 7.326* 7.362 7.386  PCO2ART 65.3* 53.8* 41.5  PO2ART 50.0* 81.7 74.0*   Liver Enzymes  Recent Labs Lab 2015/06/08 1852  06/03/15 0416 06/03/15 1603 06/04/15 0310  AST 25  --   --   --   --   ALT 16*  --   --   --   --   ALKPHOS 60  --   --   --   --   BILITOT 1.3*  --   --   --   --   ALBUMIN 3.0*  < > 1.8* 1.7* 1.7*  < > = values in this interval not displayed. Cardiac Enzymes  Recent Labs Lab 06-08-15 2309 05/29/15 0445 05/29/15 1000  TROPONINI >65.00* >65.00* >65.00*   Glucose  Recent Labs Lab 06/03/15 0806 06/03/15 1118 06/03/15 1555 06/03/15 1954 06/04/15 06/04/15 0306  GLUCAP 134* 114* 119* 100* 135* 134*    Imaging No results found.   ASSESSMENT / PLAN: 59 y/o male with multiorgan dysfunction  syndrome due to mixed cardiogenic shock from intra-operative MI complicated by septic shock from appendicitis without source control.  His prognosis is overall very poor as he is on supra-maximal doses multiple vaso-pressor agents, IABP and maximum ventilator support for has combined metabolic and respiratory acidosis.  PULMONARY A:  Hypoxemic and hypercapnic respiratory failure - despite max vent support. P:   Start weaning PEEP to 16.  Check ABG  CARDIOVASCULAR L FEM IABP 12/2 >>  L IJ TLC 12/2 >>  A:  VF Arrest - intraoperatively, s/p LHC with DES to RCA Mixed cardiogenic shock - in the setting of inferior intra-operative MI and septic shock.  IABP 1:1.  ScVO2 68 Torsades / Monomorphic VT, Frequent PVC's - suspect in setting of ischemia  Afib RV Infarct  Swan ganz cath P:  Continue heparin gtt and dual anti-platelet therapy. Continue Amiodarone, off Lido Continue levophed, vasopressin and dopamine  ASA  Stress dose steroids  Cardiology Following   RENAL A:   AGMA/NAGMA: 2/2 oliguric renal failure and lactic acidemia. Hyponatremia  Acute Kidney Injury - in setting of arrest / hypoperfusion  P:   Vent support as above CVVH Monitor UOP / BMP  Replace electrolytes as indicated  Trend lactic acid   GASTROINTESTINAL A:   Appendicitis - unable to complete surgery for source control.  No evidence of peritonitis or rupture. P:   NPO  PPI  No plan for repeat OR per surgery given his critical condition  HEMATOLOGIC A:   Dual Antiplatelet therapy and heparin gtt. P:  Aggrenox / Plavix Heparin gtt per pharmacy   INFECTIOUS A:   Septic shock - secondary to appendicitis without source control. P:   BCx2 12/2 >>  ABX: Vanc 12/2 >>  Meropenem 12/3 >> Anidulafungin 12/7 >>  Monitor fever curve / WBC  ENDOCRINE A:   Relative adrenal insufficiency.   P:   Hydrocortisone 100mg  IV Q8hours SSI to resistant scale, CBG Q4  NEUROLOGIC A:   ICU Associated Pain  P:    RASS goal: -2 with vent compliance Fentanyl gtt for pain Versed for sedation   FAMILY  - Updates:  No family at bedside.  Continue all current measures.  NO CPR in the event of arrest.  Shock / drugs ok.   Critical care time-35 minutes.  Chilton Greathouse MD [facility] Pager [phone] If no answer or after 3pm call: [phone] 06/04/2015, 11:32 AM

## 2015-06-04 NOTE — Progress Notes (Signed)
Advanced Heart Failure Rounding Note   Subjective:    Remains intubated/sedated on CVVHD. Pulling 200-300cc/hr. Down another 11 pounds overnight but still 70 pounds above admit weight. Minimal u/o.    VT/VF quiescent for several days. Staying in NSR on amio. On vanc, meropenum, and fungal coverage.   Vasopressin restarted last night to facilitate volume removal. Continue norepi and milrinone. IABP out.   Swan numbers this am.  RA 11 PA 38/25 PCWP 15 Thermo 7.7/3.0   Objective:   Weight Range:  Vital Signs:   Temp:  [95.9 F (35.5 C)-98.4 F (36.9 C)] 97 F (36.1 C) (12/09 1000) Pulse Rate:  [83-94] 90 (12/09 0731) Resp:  [10-32] 20 (12/09 1000) BP: (77-117)/(42-94) 117/59 mmHg (12/09 0731) SpO2:  [90 %-99 %] 93 % (12/09 1000) Arterial Line BP: (82-168)/(41-79) 124/57 mmHg (12/09 1000) FiO2 (%):  [60 %-80 %] 60 % (12/09 1000) Weight:  [150.5 kg (331 lb 12.7 oz)] 150.5 kg (331 lb 12.7 oz) (12/09 0407) Last BM Date: 05/27/15  Weight change: Filed Weights   06/02/15 0422 06/03/15 0559 06/04/15 0407  Weight: 161.7 kg (356 lb 7.7 oz) 155.3 kg (342 lb 6 oz) 150.5 kg (331 lb 12.7 oz)    Intake/Output:   Intake/Output Summary (Last 24 hours) at 06/04/15 1144 Last data filed at 06/04/15 1100  Gross per 24 hour  Intake 4188.3 ml  Output  16109 ml  Net -6658.7 ml     Physical Exam: General:  Intubated sedated. HEENT: normal x for ETT Neck: supple. LIJ  RIJ swan Carotids 2+ bilat;  No lymphadenopathy or thryomegaly appreciated. Cor: PMI nonpalpable. Regular rate & rhythm. No rubs, gallops or murmurs. Lungs: clear anteriorly Abdomen: Markedly obese. Soft. Hypoactive BS Extremities: left femoral IABP no cyanosis, clubbing, rash, 3-4+ edema. HD catheter in R groin.  Neuro: sedated no focal  Telemetry: NSR 90-100s  Labs: Basic Metabolic Panel:  Recent Labs Lab 05/31/15 1715  06/01/15 1122  06/02/15 0410 06/02/15 1600 06/03/15 0416 06/03/15 1603  06/04/15 0310  NA 124*  < > 126*  < > 126*  126* 129* 129* 131* 130*  131*  K 2.8*  < > 3.3*  < > 4.0  4.0 4.0 4.1 4.1 4.5  4.4  CL 83*  < > 84*  < > 86*  86* 90* 90* 95* 97*  96*  CO2 34*  < > 31  < > GLUCOSE 171*  < > 145*  < > 162*  160* 154* 150* 116* 149*  150*  BUN 34*  < > 41*  < > 45*  45* 40* 38* 38* 38*  37*  CREATININE 1.81*  < > 1.67*  < > 1.73*  1.74* 1.47* 1.50* 1.59* 1.81*  1.80*  CALCIUM 6.1*  < > 6.0*  < > 6.2*  6.2* 6.4* 6.5* 6.5* 6.7*  6.7*  MG 1.8  --  2.1  --  2.0  --  2.3  --  2.4  PHOS  --   --   --   --  4.8* 4.4 4.4 4.0 4.2  < > = values in this interval not displayed.  Liver Function Tests:  Recent Labs Lab 06/18/2015 1852 06/02/15 0410 06/02/15 1600 06/03/15 0416 06/03/15 1603 06/04/15 0310  AST 25  --   --   --   --   --   ALT 16*  --   --   --   --   --  ALKPHOS 60  --   --   --   --   --   BILITOT 1.3*  --   --   --   --   --   PROT 5.5*  --   --   --   --   --   ALBUMIN 3.0* 1.7* 1.8* 1.8* 1.7* 1.7*   No results for input(s): LIPASE, AMYLASE in the last 168 hours. No results for input(s): AMMONIA in the last 168 hours.  CBC:  Recent Labs Lab 06-16-15 1852  05/31/15 0145 06/01/15 0125 06/02/15 0410 06/03/15 0805 06/04/15 0310  WBC 17.1*  < > 17.9* 19.4* 27.1* 25.1* 22.2*  NEUTROABS 15.4*  --   --   --   --  22.5*  --   HGB 13.4  < > 12.1* 11.3* 11.4* 10.5* 9.9*  HCT 40.8  < > 34.5* 32.2* 32.7* 30.8* 29.2*  MCV 91.7  < > 83.9 82.6 85.2 84.8 87.2  PLT 209  < > 195 190 182 278 179  < > = values in this interval not displayed.  Cardiac Enzymes:  Recent Labs Lab 16-Jun-2015 1852 2015-06-16 1853 16-Jun-2015 2309 05/29/15 0445 05/29/15 1000  CKTOTAL  --  49  --   --   --   CKMB  --  8.9*  --   --   --   TROPONINI 0.65*  --  >65.00* >65.00* >65.00*    BNP: BNP (last 3 results) No results for input(s): BNP in the last 8760 hours.  ProBNP (last 3 results) No results for input(s): PROBNP in the  last 8760 hours.    Other results:  Imaging: No results found.   Medications:     Scheduled Medications: . albumin human  25 g Intravenous Q6H  . anidulafungin  100 mg Intravenous Q24H  . antiseptic oral rinse  7 mL Mouth Rinse 10 times per day  . aspirin  81 mg Per Tube Daily  . atorvastatin  80 mg Per NG tube q1800  . chlorhexidine gluconate  15 mL Mouth Rinse BID  . Chlorhexidine Gluconate Cloth  6 each Topical Q0600  . clopidogrel  75 mg Per NG tube Daily  . fentaNYL (SUBLIMAZE) injection  50 mcg Intravenous Once  . hydrocortisone sod succinate (SOLU-CORTEF) inj  100 mg Intravenous Q8H  . Influenza vac split quadrivalent PF  0.5 mL Intramuscular Tomorrow-1000  . insulin aspart  0-20 Units Subcutaneous 6 times per day  . meropenem (MERREM) IV  1 g Intravenous 3 times per day  . pantoprazole sodium  40 mg Per Tube Daily  . sodium chloride  10-40 mL Intracatheter Q12H  . sodium chloride  3 mL Intravenous Q12H  . vancomycin  1,500 mg Intravenous Q24H    Infusions: . sodium chloride 0 mL/hr at 06/03/15 1600  . sodium chloride 10 mL/hr at 06/04/15 0800  . amiodarone 60 mg/hr (06/04/15 0933)  . DOPamine Stopped (06/03/15 0805)  . fentaNYL infusion INTRAVENOUS 200 mcg/hr (06/04/15 0350)  . heparin 1,150 Units/hr (06/04/15 0402)  . midazolam (VERSED) infusion 4 mg/hr (06/04/15 0800)  . milrinone 0.375 mcg/kg/min (06/04/15 1011)  . norepinephrine (LEVOPHED) Adult infusion 30 mcg/min (06/04/15 0830)  . dialysis replacement fluid (prismasate) 500 mL/hr at 06/04/15 0351  . dialysis replacement fluid (prismasate) 300 mL/hr at 06/04/15 0420  . dialysate (PRISMASATE) 1,000 mL/hr at 06/04/15 0350  . vasopressin (PITRESSIN) infusion - *FOR SHOCK* 0.03 Units/min (06/03/15 2232)    PRN Medications: sodium chloride, fentaNYL, heparin, heparin, midazolam,  midazolam, morphine injection, ondansetron **OR** ondansetron (ZOFRAN) IV, simethicone, sodium chloride, sodium  chloride   Assessment:   1. Acute systolic HF with cardiogenic shock with biventricular dysfunction R>L 2. Acute inferior STEMI with RV involvement   --s/p PCI RCA on 12/2 3. Acute respiratory failure 4. AKI now on CVVHD 5. Acute appendicitis being treated medically 6. Recurrent VT/VF 7. New onset AF - back in NSR on amiodarone gtt 8. Hypomagnesemia 9. Hypocalcemia 10. Hypokalemia 11. Hyponatremia  Plan/Discussion:    Volume status improving with CVVHD but still massively volume overloaded. Will continue CVVHD. Discussed with Dr. Eliott Nine at bedside. Will give albumin to increase oncotic pressure. Now essentially anuric. Will need to see if he has any renal recovery  Swan numbers done personally at bedside. Hemodynamics improving but still requiring pressor support to maintain BP. Will pull swan today. Continue to wean pressors.   WBC decreasing. Belly not acute but suspect may have walled off abscess. Will continue abx and fungal coverage. Discussed with Dr. Lindie Spruce in [facility]. Once CVVHD filter clots will send down for repeat ab CT (with oral contrast) to look for intra-abdominal asbscess.   Continue amio and heparin for AF. On DAPT for recent stent.   The patient is critically ill with multiple organ systems failure and requires high complexity decision making for assessment and support, frequent evaluation and titration of therapies, application of advanced monitoring technologies and extensive interpretation of multiple databases.   Critical Care Time devoted to patient care services described in this note is 45 Minutes  Bensimhon, Daniel,MD 4:44 PM   Length of Stay: 7   06/04/2015, 11:44 AM  Advanced Heart Failure Team Pager [phone] (M-F; 7a - 4p)  Please contact [facility] for night-coverage after hours (4p -7a ) and weekends on [email or web address]    .

## 2015-06-04 NOTE — Progress Notes (Signed)
Pt. Placed on FiO2 of 100% due to desating.

## 2015-06-04 NOTE — Progress Notes (Signed)
[facility] Rounding Note  Subjective:   CRRT initiated 12/6 - tolerating 200-300 per hour off but did have addition of vasopressin last PM Still 31 kg up from admission Essentially anuric now Balloon pump out Oozing from HD cath site better To go for CT abd today (waiting for CRRT filter to clot so as not to interrupt fluid removal)  Objective:    Vital signs in last 24 hours: Filed Vitals:   06/04/15 0731 06/04/15 0800 06/04/15 0900 06/04/15 1000  BP: 117/59     Pulse: 90     Temp:  97.3 F (36.3 C) 97.2 F (36.2 C) 97 F (36.1 C)  TempSrc:      Resp: 20 19 18 20   Height:      Weight:      SpO2: 95% 93% 93% 93%   Weight change: -10 lb 9.3 oz (-4.8 kg)  Intake/Output Summary (Last 24 hours) at 06/04/15 1045 Last data filed at 06/04/15 1011  Gross per 24 hour  Intake 4189.9 ml  Output  94854 ml  Net -6628.1 ml     Physical Exam:  Blood pressure 117/59, pulse 90, temperature 97 F (36.1 C), temperature source Core (Comment), resp. rate 20, height 5\' 10"  (1.778 m), weight 331 lb 12.7 oz (150.5 kg), SpO2 93 %. CVP 11 General appearance: Intubated and sedated, morbidly obese.  Multiple lines Gross anasarca Resp: Anteriorly clear Cardio: Very distant, no rub, regular rhythm GI: obese, distended/tense, no bowel sounds Extremities: edema 2+ anasarca to abdomen/ Massive scrotal edema + hernia R fem dialysis catheter   Weight trending: 12/2 119.25 kg 12/6 160 kg  CRRT started (late PM) 12/7 161.7 kg 12/8  155.3 12/9 150.5 kg  Labs:   Recent Labs Lab 06/01/15 1122 06/01/15 1836 06/02/15 0410 06/02/15 1600 06/03/15 0416 06/03/15 1603 06/04/15 0310  NA 126* 124* 126*  126* 129* 129* 131* 130*  131*  K 3.3* 3.8 4.0  4.0 4.0 4.1 4.1 4.5  4.4  CL 84* 83* 86*  86* 90* 90* 95* 97*  96*  CO2 31 31 31  31 29 29 27 29  29   GLUCOSE 145* 135* 162*  160* 154* 150* 116* 149*  150*  BUN 41* 45* 45*  45* 40* 38* 38* 38*  37*  CREATININE  1.67* 1.85* 1.73*  1.74* 1.47* 1.50* 1.59* 1.81*  1.80*  CALCIUM 6.0* 6.1* 6.2*  6.2* 6.4* 6.5* 6.5* 6.7*  6.7*  PHOS  --   --  4.8* 4.4 4.4 4.0 4.2     Recent Labs Lab 06/11/2015 1852  06/03/15 0416 06/03/15 1603 06/04/15 0310  AST 25  --   --   --   --   ALT 16*  --   --   --   --   ALKPHOS 60  --   --   --   --   BILITOT 1.3*  --   --   --   --   PROT 5.5*  --   --   --   --   ALBUMIN 3.0*  < > 1.8* 1.7* 1.7*  < > = values in this interval not displayed. No results for input(s): LIPASE, AMYLASE in the last 168 hours.   Recent Labs Lab 06/02/2015 1852  06/01/15 0125 06/02/15 0410 06/03/15 0805 06/04/15 0310  WBC 17.1*  < > 19.4* 27.1* 25.1* 22.2*  NEUTROABS 15.4*  --   --   --  22.5*  --   HGB 13.4  < >  11.3* 11.4* 10.5* 9.9*  HCT 40.8  < > 32.2* 32.7* 30.8* 29.2*  MCV 91.7  < > 82.6 85.2 84.8 87.2  PLT 209  < > 190 182 278 179  < > = values in this interval not displayed.     Recent Labs Lab Jun 10, 2015 1852 06/18/2015 1853 06/04/2015 2309 05/29/15 0445 05/29/15 1000  CKTOTAL  --  49  --   --   --   CKMB  --  8.9*  --   --   --   TROPONINI 0.65*  --  >65.00* >65.00* >65.00*     Recent Labs Lab 06/03/15 1118 06/03/15 1555 06/03/15 1954 06/04/15 06/04/15 0306  GLUCAP 114* 119* 100* 135* 134*    Medications . sodium chloride 0 mL/hr at 06/03/15 1600  . sodium chloride 10 mL/hr at 06/04/15 0800  . amiodarone 60 mg/hr (06/04/15 0933)  . DOPamine Stopped (06/03/15 0805)  . fentaNYL infusion INTRAVENOUS 200 mcg/hr (06/04/15 0350)  . heparin 1,150 Units/hr (06/04/15 0402)  . midazolam (VERSED) infusion 4 mg/hr (06/04/15 0800)  . milrinone 0.375 mcg/kg/min (06/04/15 1011)  . norepinephrine (LEVOPHED) Adult infusion 30 mcg/min (06/04/15 0830)  . dialysis replacement fluid (prismasate) 500 mL/hr at 06/04/15 0351  . dialysis replacement fluid (prismasate) 300 mL/hr at 06/04/15 0420  . dialysate (PRISMASATE) 1,000 mL/hr at 06/04/15 0350  . vasopressin  (PITRESSIN) infusion - *FOR SHOCK* 0.03 Units/min (06/03/15 2232)   . anidulafungin  100 mg Intravenous Q24H  . antiseptic oral rinse  7 mL Mouth Rinse 10 times per day  . aspirin  81 mg Per Tube Daily  . atorvastatin  80 mg Per NG tube q1800  . chlorhexidine gluconate  15 mL Mouth Rinse BID  . Chlorhexidine Gluconate Cloth  6 each Topical Q0600  . clopidogrel  75 mg Per NG tube Daily  . fentaNYL (SUBLIMAZE) injection  50 mcg Intravenous Once  . hydrocortisone sod succinate (SOLU-CORTEF) inj  100 mg Intravenous Q8H  . Influenza vac split quadrivalent PF  0.5 mL Intramuscular Tomorrow-1000  . insulin aspart  0-20 Units Subcutaneous 6 times per day  . meropenem (MERREM) IV  1 g Intravenous 3 times per day  . pantoprazole sodium  40 mg Per Tube Daily  . sodium chloride  10-40 mL Intracatheter Q12H  . sodium chloride  3 mL Intravenous Q12H  . vancomycin  1,500 mg Intravenous Q24H    Background: 59 yo M with PMH sig for HTN, DM, nephrolithiasis, tobacco abuse, and TIA's who presented to [facility] on 06/12/2015 with right sided abdominal pain and was found to have acute appendicitis. He went for surgery, however after the first trocar was introduced, he suffered a VT cardiac arrest on the table (surgery aborted). He was transferred to [facility] after a code STEMI was called. Had cath with DES to RCA and  insertion of an IABP for MI and cardiogenic shock, started on pressors and transferred to CCU. Scr peaked at 2.24 following cath and was improving until 12/6 when his UOP dropped, O2 demands increased. Noted to be 40L +. We were consulted to further evaluate ARF and provide CRRT for volume management. In addition CT scan revealed hydro of right ureter with evidence of 6x76mm calculus with bilateral  hydronephrosis (marked hydro on R).CRRT initiated 12/6.   Assessment/Recommendations 1. ARF, now anuric in setting of cardiogenic shock/sepsis and IV contrast as well as bilateral hydronephrosis and  obstructing stone in right ureter on CT from 12/2 . CRRT initiated 12/6. Volume 40  kg up prior to CRRT initiation. Tolerating fluid removal at neg 200-300/hour. Weight coming down but still 30 kg up. K and phos both still look OK.  Needing a little more pressor support. CVP 11. Will give some albumin to mobilize 3rd spp fluid (alb 1.7). Hyponatremia improving..  2. Nephrolithiasis with obstructive uropathy/right stone but bilateral hydronephrosis on CT from 12/2. Will be able to re-evaluate that on today's CT.  Too unstable for any procedure at this time. Continue with supportive measures  3. Acute inferior STEMI with RV involvement/acute systolic heart failure/RV infarct/VF arrest- s/p PTCA and DES placement in RCA 12/2 complicated by cardiogenic shock requiring  IABP and pressors.    4. Acute appendicitis with gangrenous changes but unable to remove due to his cardiac arrest and critical illness. Continue with antibiotics and pressors. For CT today.  5. VDRF with massive  volume overload.O2 requirement down to 60%  6. SIRS/multi-organ failure- cardiogenic and sepsis. On pressors, antibiotics. WBC stil about 22K.  7. HEME- on heparin gtt and pharmacy adjusting.   8. MRSA PCR +   Camille Bal, MD [facility] [phone] Pager 06/04/2015, 10:45 AM

## 2015-06-04 NOTE — Progress Notes (Signed)
ANTICOAGULATION CONSULT NOTE - Follow Up Consult  Pharmacy Consult for Heparin  Indication: chest pain/ACS and atrial fibrillation, IABP is out  No Known Allergies  Patient Measurements: Height: 5\' 10"  (177.8 cm) Weight: (!) 342 lb 6 oz (155.3 kg) IBW/kg (Calculated) : 73  Vital Signs: Temp: 98.4 F (36.9 C) (12/09 0330) Temp Source: Core (Comment) (12/09 0000) BP: 85/46 mmHg (12/09 0330) Pulse Rate: 94 (12/09 0330)  Labs:  Recent Labs  06/02/15 0410  06/02/15 2215 06/03/15 0416 06/03/15 0600 06/03/15 0805 06/03/15 1603 06/04/15 0310 06/04/15 0324  HGB 11.4*  --   --   --   --  10.5*  --  9.9*  --   HCT 32.7*  --   --   --   --  30.8*  --  29.2*  --   PLT 182  --   --   --   --  278  --  179  --   HEPARINUNFRC 1.09*  < > 0.89*  --  0.91*  --   --   --  0.20*  CREATININE 1.73*  1.74*  < >  --  1.50*  --   --  1.59* 1.81*  1.80*  --   < > = values in this interval not displayed.  Estimated Creatinine Clearance: 65.8 mL/min (by C-G formula based on Cr of 1.81).   Assessment: Sub-therapeutic heparin level after re-start after pulling IABP, no current issues per RN.  Goal of Therapy:  Heparin level 0.3-0.7 units/ml Monitor platelets by anticoagulation protocol: Yes   Plan:  -Increase heparin to 1150 units/hr (cautious increase with multiple high levels 12/7-8) -1200 HL  Gerline Ratto 06/04/2015,3:57 AM

## 2015-06-04 NOTE — Progress Notes (Signed)
Unable to maintain pt's MAP of 60 despite titrating levophed up. Dr. Sherene Sires notified, new order received for vasopressin in addition to levophed. OK to maintain MAP of 55.

## 2015-06-04 NOTE — Progress Notes (Signed)
ANTICOAGULATION CONSULT NOTE - Follow Up Consult  Pharmacy Consult for Heparin  Indication: atrial fibrillation  No Known Allergies  Patient Measurements: Height: 5\' 10"  (177.8 cm) Weight: (!) 331 lb 12.7 oz (150.5 kg) IBW/kg (Calculated) : 73  Vital Signs: Temp: 97.7 F (36.5 C) (12/09 2000) Temp Source: Oral (12/09 2000) BP: 133/56 mmHg (12/09 1925) Pulse Rate: 84 (12/09 1925)  Labs:  Recent Labs  06/02/15 0410  06/03/15 0805 06/03/15 1603 06/04/15 0310 06/04/15 0324 06/04/15 1143 06/04/15 1600 06/04/15 2032  HGB 11.4*  --  10.5*  --  9.9*  --   --   --   --   HCT 32.7*  --  30.8*  --  29.2*  --   --   --   --   PLT 182  --  278  --  179  --   --   --   --   HEPARINUNFRC 1.09*  < >  --   --   --  0.20* 0.50  --  0.78*  CREATININE 1.73*  1.74*  < >  --  1.59* 1.81*  1.80*  --   --  1.85*  --   < > = values in this interval not displayed.  Estimated Creatinine Clearance: 63.2 mL/min (by C-G formula based on Cr of 1.85).   Assessment: 27 yom presented on 06/05/2015 to [facility] with acute appendicitis and was taken to the OR for a laparoscopic appendectomy. In the OR he went into cardiac arrest. EKG showed CHB and ST elevations - Code STEMI was called. He was transferred to [facility] and taken emergently to the cath lab where he received a BMS to his RCA, temporary pacemaker and IABP were placed.   IABP has been pulled. Heparin resumed for PMH of a-fib. 12/9 HL 0.5, H/H stable   Goal of Therapy:  Heparin level 0.3-0.7 units/ml Monitor platelets by anticoagulation protocol: Yes   Plan:  -Continue heparin 1150 units/hr -2000 HL -daily HL, CBC   Sherron Monday, PharmD Clinical Pharmacy Resident Pager: [phone] 06/04/2015 9:22 PM   Addendum -Heparin level is slightly supratheraputic  -Heparin dosing weight: 109 kg -Reduce gtt to 1050 units/hr -Next level with AM labs  Baldemar Friday  06/04/2015 9:25 PM

## 2015-06-04 NOTE — Progress Notes (Signed)
At D/C of Theone Murdoch cathether, pt had episode of Torsades. Code blue called, CPR initiated and defib pads placed on pt. Prior to shock pt spontaneously converted to NSR. MD at bedside and aware. Family made aware. Will continue to monitor closely.

## 2015-06-04 NOTE — Progress Notes (Signed)
Utilization review completed.  

## 2015-06-04 NOTE — Progress Notes (Signed)
ANTICOAGULATION CONSULT NOTE - Follow Up Consult  Pharmacy Consult for Heparin  Indication: atrial fibrillation  No Known Allergies  Patient Measurements: Height: 5\' 10"  (177.8 cm) Weight: (!) 331 lb 12.7 oz (150.5 kg) IBW/kg (Calculated) : 73  Vital Signs: Temp: 96.8 F (36 C) (12/09 1200) Temp Source: Core (Comment) (12/09 1200) BP: 117/57 mmHg (12/09 1153) Pulse Rate: 85 (12/09 1153)  Labs:  Recent Labs  06/02/15 0410  06/03/15 0416 06/03/15 0600 06/03/15 0805 06/03/15 1603 06/04/15 0310 06/04/15 0324 06/04/15 1143  HGB 11.4*  --   --   --  10.5*  --  9.9*  --   --   HCT 32.7*  --   --   --  30.8*  --  29.2*  --   --   PLT 182  --   --   --  278  --  179  --   --   HEPARINUNFRC 1.09*  < >  --  0.91*  --   --   --  0.20* 0.50  CREATININE 1.73*  1.74*  < > 1.50*  --   --  1.59* 1.81*  1.80*  --   --   < > = values in this interval not displayed.  Estimated Creatinine Clearance: 64.6 mL/min (by C-G formula based on Cr of 1.81).   Assessment: 69 yom presented on 05/29/2015 to [facility] with acute appendicitis and was taken to the OR for a laparoscopic appendectomy. In the OR he went into cardiac arrest. EKG showed CHB and ST elevations - Code STEMI was called. He was transferred to [facility] and taken emergently to the cath lab where he received a BMS to his RCA, temporary pacemaker and IABP were placed.   IABP has been pulled. Heparin resumed for PMH of a-fib. 12/9 HL 0.5, H/H stable   Goal of Therapy:  Heparin level 0.3-0.7 units/ml Monitor platelets by anticoagulation protocol: Yes   Plan:  -Continue heparin 1150 units/hr -2000 HL -daily HL, CBC   Sherron Monday, PharmD Clinical Pharmacy Resident Pager: [phone] 06/04/2015 1:43 PM

## 2015-06-05 ENCOUNTER — Inpatient Hospital Stay (HOSPITAL_COMMUNITY): Payer: Medicaid Other

## 2015-06-05 DIAGNOSIS — J80 Acute respiratory distress syndrome: Secondary | ICD-10-CM

## 2015-06-05 LAB — CBC
HEMATOCRIT: 26 % — AB (ref 39.0–52.0)
HEMOGLOBIN: 8.8 g/dL — AB (ref 13.0–17.0)
MCH: 29 pg (ref 26.0–34.0)
MCHC: 33.8 g/dL (ref 30.0–36.0)
MCV: 85.8 fL (ref 78.0–100.0)
Platelets: 175 10*3/uL (ref 150–400)
RBC: 3.03 MIL/uL — AB (ref 4.22–5.81)
RDW: 15.4 % (ref 11.5–15.5)
WBC: 23.2 10*3/uL — ABNORMAL HIGH (ref 4.0–10.5)

## 2015-06-05 LAB — RENAL FUNCTION PANEL
ANION GAP: 11 (ref 5–15)
Albumin: 2.4 g/dL — ABNORMAL LOW (ref 3.5–5.0)
Albumin: 2.7 g/dL — ABNORMAL LOW (ref 3.5–5.0)
Anion gap: 13 (ref 5–15)
BUN: 41 mg/dL — AB (ref 6–20)
BUN: 42 mg/dL — ABNORMAL HIGH (ref 6–20)
CHLORIDE: 97 mmol/L — AB (ref 101–111)
CHLORIDE: 99 mmol/L — AB (ref 101–111)
CO2: 24 mmol/L (ref 22–32)
CO2: 24 mmol/L (ref 22–32)
Calcium: 7 mg/dL — ABNORMAL LOW (ref 8.9–10.3)
Calcium: 7.6 mg/dL — ABNORMAL LOW (ref 8.9–10.3)
Creatinine, Ser: 2 mg/dL — ABNORMAL HIGH (ref 0.61–1.24)
Creatinine, Ser: 2.1 mg/dL — ABNORMAL HIGH (ref 0.61–1.24)
GFR calc non Af Amer: 35 mL/min — ABNORMAL LOW (ref >60)
GFR, EST AFRICAN AMERICAN: 40 mL/min — AB (ref >60)
GFR, EST AFRICAN AMERICAN: 38 mL/min — AB (ref >60)
GFR, EST NON AFRICAN AMERICAN: 33 mL/min — AB (ref >60)
GLUCOSE: 143 mg/dL — AB (ref 65–99)
Glucose, Bld: 133 mg/dL — ABNORMAL HIGH (ref 65–99)
PHOSPHORUS: 4.3 mg/dL (ref 2.5–4.6)
POTASSIUM: 4.2 mmol/L (ref 3.5–5.1)
Phosphorus: 3.6 mg/dL (ref 2.5–4.6)
Potassium: 4.3 mmol/L (ref 3.5–5.1)
SODIUM: 134 mmol/L — AB (ref 135–145)
Sodium: 134 mmol/L — ABNORMAL LOW (ref 135–145)

## 2015-06-05 LAB — GLUCOSE, CAPILLARY
GLUCOSE-CAPILLARY: 119 mg/dL — AB (ref 65–99)
GLUCOSE-CAPILLARY: 124 mg/dL — AB (ref 65–99)
GLUCOSE-CAPILLARY: 130 mg/dL — AB (ref 65–99)
Glucose-Capillary: 146 mg/dL — ABNORMAL HIGH (ref 65–99)
Glucose-Capillary: 128 mg/dL — ABNORMAL HIGH (ref 65–99)
Glucose-Capillary: 133 mg/dL — ABNORMAL HIGH (ref 65–99)

## 2015-06-05 LAB — HEPARIN LEVEL (UNFRACTIONATED)
HEPARIN UNFRACTIONATED: 0.75 [IU]/mL — AB (ref 0.30–0.70)
Heparin Unfractionated: 0.62 IU/mL (ref 0.30–0.70)
Heparin Unfractionated: 0.67 IU/mL (ref 0.30–0.70)

## 2015-06-05 LAB — MAGNESIUM: Magnesium: 2.6 mg/dL — ABNORMAL HIGH (ref 1.7–2.4)

## 2015-06-05 MED ORDER — VITAL HIGH PROTEIN PO LIQD
1000.0000 mL | ORAL | Status: DC
  Administered 2015-06-05 – 2015-06-08 (×6): 1000 mL
  Filled 2015-06-05 (×11): qty 1000

## 2015-06-05 MED ORDER — VITAL HIGH PROTEIN PO LIQD
1000.0000 mL | ORAL | Status: DC
  Filled 2015-06-05 (×2): qty 1000

## 2015-06-05 MED ORDER — IOHEXOL 300 MG/ML  SOLN
25.0000 mL | INTRAMUSCULAR | Status: AC
  Administered 2015-06-05 (×2): 25 mL via ORAL

## 2015-06-05 MED ORDER — VITAL HIGH PROTEIN PO LIQD
1000.0000 mL | ORAL | Status: DC
  Administered 2015-06-06: 1000 mL
  Filled 2015-06-05 (×3): qty 1000

## 2015-06-05 NOTE — Progress Notes (Signed)
PULMONARY / CRITICAL CARE MEDICINE   Name: Scott Holden MRN: [id number] DOB: 05-24-1956    ADMISSION DATE:  2015/06/02 CONSULTATION DATE:  06/02/2015   REFERRING MD :  Dr. Clifton James  CHIEF COMPLAINT:  Cardiogenic and septic shock  INITIAL PRESENTATION:  51M presented to [facility] with several days of progressively worsening RLQ pain found to have an acute appendicitis.  He went to the OR and suffered a VT arrest on the table after the insertion of the trochar.  He was transferred to [facility] after a code STEMI was called and the surgical intervention was aborted.  In the cath lab an RCA DES was placed and an IABP inserted for MI and cardiogenic shock.  While in cath lab, he required shock x6 for VF arrest and post return to ICU, shock x5.     SUBJECTIVE:  Sedated on vent   VITAL SIGNS: Temp:  [96.8 F (36 C)-98 F (36.7 C)] 97.9 F (36.6 C) (12/10 0800) Pulse Rate:  [84-88] 87 (12/10 0414) Resp:  [16-24] 21 (12/10 0800) BP: (117-156)/(56-87) 140/73 mmHg (12/10 0800) SpO2:  [88 %-99 %] 95 % (12/10 0800) Arterial Line BP: (97-158)/(48-80) 146/60 mmHg (12/10 0800) FiO2 (%):  [60 %-100 %] 60 % (12/10 0800) Weight:  [142.4 kg (313 lb 15 oz)] 142.4 kg (313 lb 15 oz) (12/10 0447)   HEMODYNAMICS: PAP: (32-34)/(23-27) 33/23 mmHg CVP:  [11 mmHg-15 mmHg] 14 mmHg PCWP:  [16 mmHg] 16 mmHg CO:  [5.5 L/min] 5.5 L/min CI:  [2.1 L/min/m2] 2.1 L/min/m2   VENTILATOR SETTINGS: Vent Mode:  [-] PCV FiO2 (%):  [60 %-100 %] 60 % Set Rate:  [20 bmp] 20 bmp PEEP:  [14 cmH20-16 cmH20] 16 cmH20 Plateau Pressure:  [30 cmH20-35 cmH20] 30 cmH20   INTAKE / OUTPUT:  Intake/Output Summary (Last 24 hours) at 06/05/15 0850 Last data filed at 06/05/15 0800  Gross per 24 hour  Intake 5158.97 ml  Output  42353 ml  Net -6626.03 ml    PHYSICAL EXAMINATION: General:  Obese male, critically ill Neuro:  Sedated, follows commands on WUA HEENT:  ETT tube in place, moist mucous membranes Cardiovascular:  Regular  rate and rhythm  Lungs:  Bilateral rhonchi, nonlabored Abdomen:  Soft, tender, hypoactive.  Musculoskeletal:   Normal tone, no acute deformities  Skin:  Generalized edema/anasarca   LABS:  CBC  Recent Labs Lab 06/03/15 0805 06/04/15 0310 06/05/15 0500  WBC 25.1* 22.2* 23.2*  HGB 10.5* 9.9* 8.8*  HCT 30.8* 29.2* 26.0*  PLT 278 179 175   Coag's No results for input(s): APTT, INR in the last 168 hours.   BMET  Recent Labs Lab 06/04/15 0310 06/04/15 1600 06/05/15 0500  NA 130*  131* 133* 134*  K 4.5  4.4 4.6 4.3  CL 97*  96* 97* 97*  CO2 29  29 27 24   BUN 38*  37* 37* 41*  CREATININE 1.81*  1.80* 1.85* 2.00*  GLUCOSE 149*  150* 132* 143*   Electrolytes  Recent Labs Lab 06/03/15 0416  06/04/15 0310 06/04/15 1600 06/05/15 0500  CALCIUM 6.5*  < > 6.7*  6.7* 7.0* 7.0*  MG 2.3  --  2.4  --  2.6*  PHOS 4.4  < > 4.2 4.1 4.3  < > = values in this interval not displayed. Sepsis Markers  Recent Labs Lab 05/29/15 1456  LATICACIDVEN 3.9*   ABG  Recent Labs Lab 06/02/15 0350 06/03/15 0900 06/04/15 1200  PHART 7.362 7.386 7.342*  PCO2ART 53.8* 41.5 46.9*  PO2ART 81.7 74.0* 120*   Liver Enzymes  Recent Labs Lab 06/04/15 0310 06/04/15 1600 06/05/15 0500  ALBUMIN 1.7* 2.1* 2.4*   Cardiac Enzymes  Recent Labs Lab 05/29/15 1000  TROPONINI >65.00*   Glucose  Recent Labs Lab 06/04/15 0306 06/04/15 0842 06/04/15 1133 06/04/15 1556 06/04/15 2001 06/05/15 0009  GLUCAP 134* 153* 145* 126* 120* 119*    Imaging Ct Abdomen Pelvis Wo Contrast  06/05/2015  CLINICAL DATA:  Recent appendicitis with attempted removal. Cardiac arrest during surgery with abortion of appendiceal removal. Concern for potential abscess. EXAM: CT ABDOMEN AND PELVIS WITHOUT CONTRAST TECHNIQUE: Multidetector CT imaging of the abdomen and pelvis was performed following the standard protocol without oral or intravenous contrast material administration. COMPARISON:   May 28, 2015 FINDINGS: Lower chest: There is bilateral lower lobe airspace consolidation. There is a small left pleural effusion. Hepatobiliary: No focal liver lesions are identified on this noncontrast enhanced study. There is contrast within the gallbladder. Gallbladder wall is not thickened. There is no biliary duct dilatation. Pancreas: No mass or inflammatory focus. Spleen: No splenic lesions are identified. Adrenals/Urinary Tract: Adrenals appear unremarkable bilaterally. The left kidney shows no evidence of mass or hydronephrosis. There is a 1 mm calculus in the mid to lower pole left kidney. There is no ureteral calculus on the left. On the right, there is no renal mass. There is moderate hydronephrosis on the right. There is no intrarenal calculus on the right. There is moderate hydronephrosis on the right, slightly less than on recent prior study. There is a 6 mm calculus in the proximal right ureter at the level of L4, a stable finding compared to the prior study. No new ureteral calculi identified. Urinary bladder is decompressed. Stomach/Bowel: There is no bowel obstruction. There is a small focus of pneumoperitoneum in the anterior right upper quadrant which is immediately adjacent to but felt to be separate from the ascending colon. There is no portal venous air. Nasogastric tube tube tip is in the stomach. Vascular/Lymphatic: There is atherosclerotic calcification in the aorta. There is no abdominal aortic aneurysm. No adenopathy is appreciable. Reproductive: There is artifact in the pelvis due to total hip prosthesis on the right. There does not appear to be prostatic or seminal vesicle enlargement. Other: There is fluid surrounding the appendix. The appendix is less distended compared to the previous study. There is no abscess or evidence of perforation focally in the region of the appendix. Elsewhere no abscess is seen. There is moderate ascites in the abdomen and pelvis. There is generalized  anasarca with extensive abdominal wall edema. Musculoskeletal: There is extensive degenerative change throughout the lower thoracic and lumbar spine. There are no blastic or lytic bone lesions. There are no intramuscular lesions. Total hip prosthesis noted on the right. IMPRESSION: Airspace consolidation in both lung bases. Small left pleural effusion. Widespread anasarca. In comparison with the recent prior study, there is less appendiceal dilatation. There is localized periappendiceal fluid without frank abscess. No free air is seen near the appendix. A small amount of free air is noted in the right lower quadrant immediately adjacent to the ascending colon. It is difficult to ascertain whether this focal area of pneumoperitoneum is secondary to recent trocar placement versus appendiceal or viscus perforation. Moderate ascites. Contrast in gallbladder, likely vicarious excretion secondary to recent hypotensive episodes. Persistent hydronephrosis on the right with 6 mm calculus in the proximal right ureter at the level of L4. Small nonobstructing calculus lower pole left kidney. No bowel  obstruction. No demonstrable abscess in the abdomen or pelvis. These results will be called to the ordering clinician or representative by the Radiologist Assistant, and communication documented in the PACS or zVision Dashboard. Electronically Signed   By: Bretta Bang III M.D.   On: 06/05/2015 07:32   Tests/studies CT abd/pelvis 12/9: Airspace consolidation in both lung bases. Small left pleural effusion. Widespread anasarca.In comparison with the recent prior study, there is less appendiceal dilatation. There is localized periappendiceal fluid without frank abscess. No free air is seen near the appendix. A small amount of free air is noted in the right lower quadrant immediately adjacent to the ascending colon. It is difficult to ascertain whether this focal area of pneumoperitoneum is secondary to recent trocar  placement versus appendiceal or viscus perforation. Moderate ascites. Contrast in gallbladder, likely vicarious excretion secondary to recent hypotensive episodes.Persistent hydronephrosis on the right with 6 mm calculus in the proximal right ureter at the level of L4. Small nonobstructing calculus lower pole left kidney. No bowel obstruction. No demonstrable abscess in the abdomen or pelvis.   ASSESSMENT / PLAN: 59 y/o male with multiorgan dysfunction syndrome due to mixed cardiogenic shock from intra-operative MI complicated by septic shock from appendicitis without source control.  His prognosis is overall very poor as he was on supra-maximal doses multiple vaso-pressor agents, IABP and maximum ventilator support for has combined metabolic and respiratory acidosis. Over the last 48 hrs he has made some progress. Pressor requirements down some, he remains off IABP, we are weaning PEEP. He still has a right hydro, but this is an issue we can eval further when his shock resolves. Will place FT and start trickle feeds. Have discussed plan w/ nephrology AND surgical team  PULMONARY A:  Hypoxemic and hypercapnic respiratory failure - despite max vent support. Bibasilar atlelectasis L>R P:   Cont full vent support Start weaning PEEP to 16-->14 on 12/10.  F/u am ABG  CARDIOVASCULAR L FEM IABP 12/2 >>  L IJ TLC 12/2 >>  A:  VF Arrest - intraoperatively, s/p LHC with DES to RCA Mixed cardiogenic shock - in the setting of inferior intra-operative MI and septic shock.  IABP now out Torsades / Monomorphic VT, Frequent PVC's - suspect in setting of ischemia  Afib RV Infarct  Theone Murdoch cath removed 12/9 Weaning pressors. Seems to be improving minutely  P:  Continue heparin gtt and dual anti-platelet therapy. Continue Amiodarone, off Lido Continue levophed, vasopressin ASA  Stress dose steroids  Cardiology Following   RENAL A:   AGMA/NAGMA: 2/2 oliguric renal failure and lactic  acidemia. Hyponatremia  Acute Kidney Injury - in setting of arrest / hypoperfusion  Persistent Right hydronephrosis.  P:   Vent support as above CVVH Monitor UOP / BMP  Replace electrolytes as indicated  Suspect he will need perc nephrostomy tube on right. Will d/w cards timing.Marland Kitchen   GASTROINTESTINAL A:   Appendicitis - unable to complete surgery for source control.  No evidence of peritonitis or rupture. No abscess formation from f/u CT scan 12/9 P:    PPI  No plan for repeat OR per surgery given his critical condition Start tubefeeds (d/w surgical team)  HEMATOLOGIC A:   Dual Antiplatelet therapy and heparin gtt. P:  Aggrenox / Plavix Heparin gtt per pharmacy   INFECTIOUS A:   Septic shock - secondary to appendicitis without source control. P:   BCx2 12/2 >>  ABX: Vanc 12/2 >>  Meropenem 12/3 >> Anidulafungin 12/7 >>  Monitor  fever curve / WBC  ENDOCRINE A:   Relative adrenal insufficiency.   P:   Hydrocortisone 100mg  IV Q8hours SSI to resistant scale, CBG Q4  NEUROLOGIC A:   ICU Associated Pain: follows commands  P:   RASS goal: -2 with vent compliance Fentanyl gtt for pain Versed for sedation   FAMILY  - Updates: father updated at bedside.  Continue all current measures.  NO CPR in the event of arrest.  Shock / drugs ok.   CCM time 45 minutes Simonne Martinet ACNP-BC [facility] Pulmonary/Critical Care Pager # [phone] OR # [phone] if no answer   06/05/2015, 8:50 AM

## 2015-06-05 NOTE — Progress Notes (Signed)
[facility] Rounding Note  Events of last 24 hours: CRRT (initiated 12/6)  tolerating 200-300 per hour off/all 4K bath/no heparin (getting systemic) 20 kg down (20 to go) Essentially anuric  Balloon pump out 12/8 Oozing from HD cath site resolved CT yesterday no frank abscess, persistent hydronephrosis on the right with 6 mm calculus proximal right ureter Pressors coming down  Objective:    Vital signs in last 24 hours: Filed Vitals:   06/05/15 0600 06/05/15 0700 06/05/15 0800 06/05/15 0936  BP:   140/73 135/59  Pulse:    89  Temp:   97.9 F (36.6 C)   TempSrc:   Oral   Resp: Height:      Weight:      SpO2: 95% 96% 95% 94%   Weight change: -8.1 kg (-17 lb 13.7 oz)  Intake/Output Summary (Last 24 hours) at 06/05/15 0941 Last data filed at 06/05/15 0900  Gross per 24 hour  Intake 5022.32 ml  Output  40981 ml  Net -6407.68 ml     Physical Exam:  Blood pressure 135/59, pulse 89, temperature 97.9 F (36.6 C), temperature source Oral, resp. rate 20, height  (1.778 m), weight 142.4 kg (313 lb 15 oz), SpO2 94 %. CVP 14 General appearance: Intubated and sedated.  Multiple lines. HD cath R groin. Gross anasarca Resp: Anteriorly clear Cardio: Very distant, no rub, regular rhythm GI: obese, distended/tense, no bowel sounds Extremities: edema 2+ anasarca to abdomen Scrotal edema + hernia R fem dialysis catheter No changes of atheroemboli   Weight trending: 12/2   119.25 kg 12/6   160 kg  CRRT started (late PM) 12/7   161.7 kg 12/8   155.3 12/9   150.5 kg 12/10 142.4 kg  Labs:   Recent Labs Lab 06/02/15 0410 06/02/15 1600 06/03/15 0416 06/03/15 1603 06/04/15 0310 06/04/15 1600 06/05/15 0500  NA 126*  126* 129* 129* 131* 130*  131* 133* 134*  K 4.0  4.0 4.0 4.1 4.1 4.5  4.4 4.6 4.3  CL 86*  86* 90* 90* 95* 97*  96* 97* 97*  CO2 GLUCOSE 162*  160* 154* 150* 116* 149*  150* 132* 143*   BUN 45*  45* 40* 38* 38* 38*  37* 37* 41*  CREATININE 1.73*  1.74* 1.47* 1.50* 1.59* 1.81*  1.80* 1.85* 2.00*  CALCIUM 6.2*  6.2* 6.4* 6.5* 6.5* 6.7*  6.7* 7.0* 7.0*  PHOS 4.8* 4.4 4.4 4.0 4.2 4.1 4.3     Recent Labs Lab 06/04/15 0310 06/04/15 1600 06/05/15 0500  ALBUMIN 1.7* 2.1* 2.4*     Recent Labs Lab 06/02/15 0410 06/03/15 0805 06/04/15 0310 06/05/15 0500  WBC 27.1* 25.1* 22.2* 23.2*  NEUTROABS  --  22.5*  --   --   HGB 11.4* 10.5* 9.9* 8.8*  HCT 32.7* 30.8* 29.2* 26.0*  MCV 85.2 84.8 87.2 85.8  PLT 182 278 179 175     Recent Labs Lab 05/29/15 1000  TROPONINI >65.00*     Recent Labs Lab 06/04/15 1133 06/04/15 1556 06/04/15 2001 06/05/15 0009 06/05/15 0457  GLUCAP 145* 126* 120* 119* 146*    Medications . sodium chloride 0 mL/hr at 06/03/15 1600  . sodium chloride Stopped (06/04/15 1200)  . amiodarone 60 mg/hr (06/05/15 0920)  . fentaNYL infusion INTRAVENOUS 100 mcg/hr (06/05/15 0800)  . heparin 1,050 Units/hr (06/05/15 0800)  . midazolam (VERSED) infusion 2 mg/hr (  06/05/15 0800)  . milrinone 0.375 mcg/kg/min (06/05/15 0800)  . norepinephrine (LEVOPHED) Adult infusion 22 mcg/min (06/05/15 0800)  . dialysis replacement fluid (prismasate) 500 mL/hr at 06/05/15 0459  . dialysis replacement fluid (prismasate) 300 mL/hr at 06/04/15 2205  . dialysate (PRISMASATE) 1,000 mL/hr at 06/05/15 0924  . vasopressin (PITRESSIN) infusion - *FOR SHOCK* 0.03 Units/min (06/05/15 0800)   . albumin human  25 g Intravenous Q6H  . anidulafungin  100 mg Intravenous Q24H  . antiseptic oral rinse  7 mL Mouth Rinse 10 times per day  . aspirin  81 mg Per Tube Daily  . atorvastatin  80 mg Per NG tube q1800  . chlorhexidine gluconate  15 mL Mouth Rinse BID  . Chlorhexidine Gluconate Cloth  6 each Topical Q0600  . clopidogrel  75 mg Per NG tube Daily  . feeding supplement (VITAL HIGH PROTEIN)  1,000 mL Per Tube Q24H  . fentaNYL (SUBLIMAZE) injection  50 mcg  Intravenous Once  . hydrocortisone sod succinate (SOLU-CORTEF) inj  100 mg Intravenous Q8H  . Influenza vac split quadrivalent PF  0.5 mL Intramuscular Tomorrow-1000  . insulin aspart  0-20 Units Subcutaneous 6 times per day  . meropenem (MERREM) IV  1 g Intravenous 3 times per day  . pantoprazole sodium  40 mg Per Tube Daily  . sodium chloride  10-40 mL Intracatheter Q12H  . sodium chloride  3 mL Intravenous Q12H  . vancomycin  1,500 mg Intravenous Q24H    Background: 59 yo M with PMH sig for HTN, DM, nephrolithiasis, tobacco abuse, and TIA's who presented to [facility] on 14-Jun-2015 with right sided abdominal pain and was found to have acute appendicitis. He went for surgery, however after the first trocar was introduced, he suffered a VT cardiac arrest on the table (surgery aborted). Transferred to [facility] after a code STEMI was called. Cath with DES to RCA and  insertion of an IABP for MI and cardiogenic shock, started on pressors and transferred to CCU. Scr peaked at 2.24 following cath and was improving until 12/6 when his UOP dropped, O2 demands increased. Noted to be 40L +. We were consulted to further evaluate ARF and provide CRRT for volume management. In addition CT scan revealed hydro of right ureter with evidence of 6x29mm calculus with bilateral  hydronephrosis (marked hydro on R).CRRT initiated 12/6.   Assessment/Recommendations  1. ARF, oligoanuric in setting of cardiogenic shock/sepsis and IV contrast, obstructive uropathy (Initial CT 12/2  bilateral hydro - repeat yesterday just R hydro/ureteral stone).  CRRT initiated 12/6. Volume 40 kg up prior to CRRT initiation. Down 20 kg as of today. Tolerating fluid removal at neg 200-300/hour. Giving albumin for oncotic pressure - mobilizing 3rd spaced fluids (and pressors coming down). K and phos both still look OK. CVP 14. Will give total of 6 doses albumin.  2. Nephrolithiasis with obstructive uropathy/right stone but bilateral  hydronephrosis on CT from 12/2. CT 12/9 showed resolution of left hydro, persistence of right hydro with a proximal ureteral calculus.  Not stable for any procedure at this time to decompress right kidney. Continue with supportive measures  3. Acute inferior STEMI with RV involvement/acute systolic heart failure/RV infarct/VF arrest- s/p PTCA and DES placement in RCA 12/2 complicated by cardiogenic shock requiring  IABP/pressors/inotropes. Balloon pump out 12/8, pressors have come down some last 24 hours.  4. VT/VF/AF - no recurrence. NSR amio. Heparin drip.  5. Acute appendicitis - appy aborted d/t cardiac arrest/critical illness. Continue with antibiotics. CT 12/9  showed fluid surrounding appendix, but less distension, no abscess or evidence of perforation. Meropenem, vanco, anidulafungin. Cultures negative.  6. VDRF with massive  volume overload.O2 requirement still at 60%. Volume coming down.  7. SIRS/multi-organ failure- cardiogenic and sepsis. On pressors, antibiotics. WBC stil about 23K.   8. MRSA PCR +   Camille Bal, MD [facility] [phone] Pager 06/05/2015, 9:41 AM

## 2015-06-05 NOTE — Progress Notes (Addendum)
Nutrition Follow-up  DOCUMENTATION CODES:   Obesity unspecified  INTERVENTION:  Initiate Vital High Protein @ 10 ml/hr via Cortrak NG tube and increase by 10 ml every 6 hours to goal rate of 75 ml/hr.   Tube feeding regimen provides 1800 kcal (100% of needs), 158 grams of protein, and 1512 ml of H2O.   Monitor magnesium, potassium, and phosphorus daily for at least 3 days, MD to replete as needed, as pt is at risk for refeeding syndrome given NPO status >7days.   NUTRITION DIAGNOSIS:   Inadequate oral intake related to inability to eat as evidenced by NPO status.  Ongoing  GOAL:   Provide needs based on ASPEN/SCCM guidelines  Unmet  MONITOR:   Vent status, Labs, Weight trends, TF tolerance, I & O's  REASON FOR ASSESSMENT:   Consult Enteral/tube feeding initiation and management  ASSESSMENT:   37M presented to [facility] with several days of progressively worsening RLQ pain found to have an acute appendicitis. He went to the OR and suffered a VT arrest on the table after the insertion of the trochar. He was transferred to [facility] after a code STEMI was called and the surgical intervention was aborted. In the cath lab an RCA DES was placed and an IABP inserted for MI and cardiogenic shock. While in cath lab, he required shock x6 for VF arrest and post return to ICU, shock x5.   Pt remains on vent support and on CVVHD. Per surgery, may start tube feedings at a low rate and build up over 24 hours. OG tube output in last 24 hours was 950 ml, some of which was contrast per RN. Per nursing notes, pt continues to have hypoactive bowel sounds (last BM 12/1). Per RN, plan is to leave OGT to suction and place Cotrak tube to provide feeds to small bowel.   Patient is currently intubated on ventilator support MV: 16.2 L/min Temp (24hrs), Avg:97.4 F (36.3 C), Min:96.8 F (36 C), Max:98 F (36.7 C) Propofol: none  Labs: low hemoglobin, low sodium, low chloride, elevated magnesium, low  calcium  Diet Order:  Diet NPO time specified  Skin:  Wound (see comment) (closed abdominal incision)  Last BM:  12/1 abd distended   Height:   Ht Readings from Last 1 Encounters:  06/21/2015 5\' 10"  (1.778 m)    Weight:   Wt Readings from Last 1 Encounters:  06/05/15 313 lb 15 oz (142.4 kg)    Ideal Body Weight:  75.5 kg  BMI:  Body mass index is 45.04 kg/(m^2).  Estimated Nutritional Needs:   Kcal:  3606-7703  Protein:  >151 grams  Fluid:  1.6-1.8 L  EDUCATION NEEDS:   Education needs no appropriate at this time  Dorothea Ogle RD, LDN Inpatient Clinical Dietitian Pager: [phone] After Hours Pager: [phone]

## 2015-06-05 NOTE — Progress Notes (Signed)
[facility] Progress Note 8 Days Post-Op  Subjective: Patient still on two pressors and two anti-arrhythmics.  Also still on CVVHD  Objective: Vital signs in last 24 hours: Temp:  [96.8 F (36 C)-98 F (36.7 C)] 97.9 F (36.6 C) (12/10 0800) Pulse Rate:  [84-89] 89 (12/10 0936) Resp:  [16-24] 20 (12/10 0936) BP: (117-156)/(56-87) 135/59 mmHg (12/10 0936) SpO2:  [88 %-99 %] 94 % (12/10 0936) Arterial Line BP: (97-158)/(48-80) 146/60 mmHg (12/10 0800) FiO2 (%):  [60 %-100 %] 60 % (12/10 0936) Weight:  [142.4 kg (313 lb 15 oz)] 142.4 kg (313 lb 15 oz) (12/10 0447) Last BM Date: 05/27/15  Intake/Output from previous day: 12/09 0701 - 12/10 0700 In: 5501.2 [I.V.:3261.2; NG/GT:600; IV Piggyback:1580] Out: 16109 [Urine:104; Emesis/NG output:950] Intake/Output this shift: Total I/O In: 205.2 [I.V.:105.2; IV Piggyback:100] Out: 902 [Urine:10; Other:892]  General: Stable,   Still on ventilator  Lungs: Clear.  FIO2 60%, PEEP +16, Oxygen saturations 94%.  ABG pending.  Abd: No apparent discomfort in the RLQ  Extremities: No changes  Neuro: Sedated heavily.  Lab Results:  (wbc:2,hgb:2,hct:2,plt:2) BMET ) Recent Labs  06/04/15 1600 06/05/15 0500  NA 133* 134*  K 4.6 4.3  CL 97* 97*  CO2 27 24  GLUCOSE 132* 143*  BUN 37* 41*  CREATININE 1.85* 2.00*  CALCIUM 7.0* 7.0*   PT/INR No results for input(s): LABPROT, INR in the last 72 hours. ABG  Recent Labs  06/03/15 0900 06/04/15 1200  PHART 7.386 7.342*  HCO3 25.3* 24.7*    Studies/Results: Ct Abdomen Pelvis Wo Contrast  06/05/2015  CLINICAL DATA:  Recent appendicitis with attempted removal. Cardiac arrest during surgery with abortion of appendiceal removal. Concern for potential abscess. EXAM: CT ABDOMEN AND PELVIS WITHOUT CONTRAST TECHNIQUE: Multidetector CT imaging of the abdomen and pelvis was performed following the standard protocol without oral or intravenous contrast material administration.  COMPARISON:  May 28, 2015 FINDINGS: Lower chest: There is bilateral lower lobe airspace consolidation. There is a small left pleural effusion. Hepatobiliary: No focal liver lesions are identified on this noncontrast enhanced study. There is contrast within the gallbladder. Gallbladder wall is not thickened. There is no biliary duct dilatation. Pancreas: No mass or inflammatory focus. Spleen: No splenic lesions are identified. Adrenals/Urinary Tract: Adrenals appear unremarkable bilaterally. The left kidney shows no evidence of mass or hydronephrosis. There is a 1 mm calculus in the mid to lower pole left kidney. There is no ureteral calculus on the left. On the right, there is no renal mass. There is moderate hydronephrosis on the right. There is no intrarenal calculus on the right. There is moderate hydronephrosis on the right, slightly less than on recent prior study. There is a 6 mm calculus in the proximal right ureter at the level of L4, a stable finding compared to the prior study. No new ureteral calculi identified. Urinary bladder is decompressed. Stomach/Bowel: There is no bowel obstruction. There is a small focus of pneumoperitoneum in the anterior right upper quadrant which is immediately adjacent to but felt to be separate from the ascending colon. There is no portal venous air. Nasogastric tube tube tip is in the stomach. Vascular/Lymphatic: There is atherosclerotic calcification in the aorta. There is no abdominal aortic aneurysm. No adenopathy is appreciable. Reproductive: There is artifact in the pelvis due to total hip prosthesis on the right. There does not appear to be prostatic or seminal vesicle enlargement. Other: There is fluid surrounding the appendix. The appendix is less distended compared to the  previous study. There is no abscess or evidence of perforation focally in the region of the appendix. Elsewhere no abscess is seen. There is moderate ascites in the abdomen and pelvis. There is  generalized anasarca with extensive abdominal wall edema. Musculoskeletal: There is extensive degenerative change throughout the lower thoracic and lumbar spine. There are no blastic or lytic bone lesions. There are no intramuscular lesions. Total hip prosthesis noted on the right. IMPRESSION: Airspace consolidation in both lung bases. Small left pleural effusion. Widespread anasarca. In comparison with the recent prior study, there is less appendiceal dilatation. There is localized periappendiceal fluid without frank abscess. No free air is seen near the appendix. A small amount of free air is noted in the right lower quadrant immediately adjacent to the ascending colon. It is difficult to ascertain whether this focal area of pneumoperitoneum is secondary to recent trocar placement versus appendiceal or viscus perforation. Moderate ascites. Contrast in gallbladder, likely vicarious excretion secondary to recent hypotensive episodes. Persistent hydronephrosis on the right with 6 mm calculus in the proximal right ureter at the level of L4. Small nonobstructing calculus lower pole left kidney. No bowel obstruction. No demonstrable abscess in the abdomen or pelvis. These results will be called to the ordering clinician or representative by the Radiologist Assistant, and communication documented in the PACS or zVision Dashboard. Electronically Signed   By: Bretta Bang III M.D.   On: 06/05/2015 07:32    Anti-infectives: Anti-infectives    Start     Dose/Rate Route Frequency Ordered Stop   06/03/15 0800  anidulafungin (ERAXIS) 100 mg in sodium chloride 0.9 % 100 mL IVPB     100 mg over 90 Minutes Intravenous Every 24 hours 06/02/15 0730     06/03/15 0600  vancomycin (VANCOCIN) 1,500 mg in sodium chloride 0.9 % 500 mL IVPB     1,500 mg 250 mL/hr over 120 Minutes Intravenous Every 24 hours 06/02/15 0807     06/02/15 1600  meropenem (MERREM) 1 g in sodium chloride 0.9 % 100 mL IVPB  Status:  Discontinued      1 g 200 mL/hr over 30 Minutes Intravenous Every 12 hours 06/02/15 0449 06/02/15 0921   06/02/15 1400  meropenem (MERREM) 1 g in sodium chloride 0.9 % 100 mL IVPB     1 g 200 mL/hr over 30 Minutes Intravenous 3 times per day 06/02/15 0921     06/02/15 0815  vancomycin (VANCOCIN) 500 mg in sodium chloride 0.9 % 100 mL IVPB     500 mg 100 mL/hr over 60 Minutes Intravenous NOW 06/02/15 0807 06/02/15 1020   06/02/15 0800  anidulafungin (ERAXIS) 200 mg in sodium chloride 0.9 % 200 mL IVPB     200 mg over 180 Minutes Intravenous  Once 06/02/15 0730 06/02/15 1139   05/30/15 0600  vancomycin (VANCOCIN) 1,250 mg in sodium chloride 0.9 % 250 mL IVPB  Status:  Discontinued     1,250 mg 166.7 mL/hr over 90 Minutes Intravenous Every 24 hours 05/29/15 0237 06/02/15 0807   05/29/15 1200  meropenem (MERREM) 1 g in sodium chloride 0.9 % 100 mL IVPB  Status:  Discontinued     1 g 200 mL/hr over 30 Minutes Intravenous Every 8 hours 05/29/15 0237 06/02/15 0449   05/29/15 0600  piperacillin-tazobactam (ZOSYN) IVPB 3.375 g  Status:  Discontinued     3.375 g 12.5 mL/hr over 240 Minutes Intravenous 3 times per day 06/16/2015 2256 05/29/15 0216   05/29/15 0245  vancomycin (VANCOCIN) 2,500  mg in sodium chloride 0.9 % 500 mL IVPB     2,500 mg 250 mL/hr over 120 Minutes Intravenous  Once 05/29/15 0237 05/29/15 0612   05/29/15 0245  meropenem (MERREM) 1 g in sodium chloride 0.9 % 100 mL IVPB     1 g 200 mL/hr over 30 Minutes Intravenous  Once 05/29/15 0237 05/29/15 0339   06/03/2015 2315  piperacillin-tazobactam (ZOSYN) IVPB 3.375 g     3.375 g 100 mL/hr over 30 Minutes Intravenous  Once 06/26/2015 2300 05/29/15 0124   05/27/2015 0830  cefTRIAXone (ROCEPHIN) 2 g in dextrose 5 % 50 mL IVPB  Status:  Discontinued     2 g 100 mL/hr over 30 Minutes Intravenous Every 24 hours 06/16/2015 0815 06/17/2015 2256   06/11/2015 0830  metroNIDAZOLE (FLAGYL) IVPB 500 mg  Status:  Discontinued     500 mg 100 mL/hr over 60 Minutes  Intravenous Every 8 hours 06/07/2015 0815 05/31/2015 2256   06/14/2015 0715  piperacillin-tazobactam (ZOSYN) IVPB 3.375 g     3.375 g 12.5 mL/hr over 240 Minutes Intravenous  Once 06/16/2015 0700 06/19/2015 0907      Assessment/Plan: s/p Procedure(s): Left Heart Cath IABP Insertion Temporary Pacemaker CT scan did not show any apprent abscess that needs drainage.  No evidence of perforation. No intervention needed radiologically or surgically.  May start tube feedings at a low rate and build up over the next 24 hours.  LOS: 8 days   Marta Lamas. Gae Bon, MD, FACS [phone] [facility] 06/05/2015

## 2015-06-05 NOTE — Progress Notes (Signed)
Patient ID: Scott Holden, male   DOB: March 03, 1956, 59 y.o.   MRN: [id number]    Advanced Heart Failure Rounding Note   Subjective:    Remains intubated/sedated on CVVHD.  Wakes up with weaning of sedation.  Still pulling 200-300cc/hr. Down 18 lbs yesterday => CVP remains 14-15 this morning and still well above baseline weight.   Torsades 12/9 when Swan pulled.  Converted spontaneously without DCCV.  K and Mg ok, on amiodarone.  Septic shock with appendicitis.  On vanc, meropenum, and fungal coverage. CT abdomen 12/10 shows less appendiceal dilation than prior.  There is peri-appendiceal fluid without frank abscess.  There is a small amount free air RLQ, may be from recent trochar (versus perf, prob less likely).   IABP out.  Remains on milrinone as well as norepinephrine and vasopressin to facilitate fluid removal.   Objective:   Weight Range:  Vital Signs:   Temp:  [96.8 F (36 C)-98 F (36.7 C)] 97.9 F (36.6 C) (12/10 0800) Pulse Rate:  [84-88] 87 (12/10 0414) Resp:  [16-24] 21 (12/10 0800) BP: (117-156)/(56-87) 140/73 mmHg (12/10 0800) SpO2:  [88 %-99 %] 95 % (12/10 0800) Arterial Line BP: (97-158)/(48-80) 146/60 mmHg (12/10 0800) FiO2 (%):  [60 %-100 %] 60 % (12/10 0800) Weight:  [313 lb 15 oz (142.4 kg)] 313 lb 15 oz (142.4 kg) (12/10 0447) Last BM Date: 05/27/15  Weight change: Filed Weights   06/03/15 0559 06/04/15 0407 06/05/15 0447  Weight: 342 lb 6 oz (155.3 kg) 331 lb 12.7 oz (150.5 kg) 313 lb 15 oz (142.4 kg)    Intake/Output:   Intake/Output Summary (Last 24 hours) at 06/05/15 0815 Last data filed at 06/05/15 0800  Gross per 24 hour  Intake 5158.97 ml  Output  40981 ml  Net -6176.03 ml     Physical Exam: General:  Intubated sedated. HEENT: normal x for ETT Neck: supple. JVP difficult (thick neck). Cor: PMI nonpalpable. Regular rate & rhythm. No rubs, gallops or murmurs. Lungs: clear anteriorly Abdomen: Markedly obese. Soft. Hypoactive  BS Extremities: Left groin cath site benign. 2+ edema to thighs bilaterally. HD catheter in R groin.  Neuro: sedated no focal  Telemetry: NSR 90-100s  Labs: Basic Metabolic Panel:  Recent Labs Lab 06/01/15 1122  06/02/15 0410  06/03/15 0416 06/03/15 1603 06/04/15 0310 06/04/15 1600 06/05/15 0500  NA 126*  < > 126*  126*  < > 129* 131* 130*  131* 133* 134*  K 3.3*  < > 4.0  4.0  < > 4.1 4.1 4.5  4.4 4.6 4.3  CL 84*  < > 86*  86*  < > 90* 95* 97*  96* 97* 97*  CO2 31  < > 31  31  < > GLUCOSE 145*  < > 162*  160*  < > 150* 116* 149*  150* 132* 143*  BUN 41*  < > 45*  45*  < > 38* 38* 38*  37* 37* 41*  CREATININE 1.67*  < > 1.73*  1.74*  < > 1.50* 1.59* 1.81*  1.80* 1.85* 2.00*  CALCIUM 6.0*  < > 6.2*  6.2*  < > 6.5* 6.5* 6.7*  6.7* 7.0* 7.0*  MG 2.1  --  2.0  --  2.3  --  2.4  --  2.6*  PHOS  --   --  4.8*  < > 4.4 4.0 4.2 4.1 4.3  < > = values in this interval not  displayed.  Liver Function Tests:  Recent Labs Lab 06/03/15 0416 06/03/15 1603 06/04/15 0310 06/04/15 1600 06/05/15 0500  ALBUMIN 1.8* 1.7* 1.7* 2.1* 2.4*   No results for input(s): LIPASE, AMYLASE in the last 168 hours. No results for input(s): AMMONIA in the last 168 hours.  CBC:  Recent Labs Lab 06/01/15 0125 06/02/15 0410 06/03/15 0805 06/04/15 0310 06/05/15 0500  WBC 19.4* 27.1* 25.1* 22.2* 23.2*  NEUTROABS  --   --  22.5*  --   --   HGB 11.3* 11.4* 10.5* 9.9* 8.8*  HCT 32.2* 32.7* 30.8* 29.2* 26.0*  MCV 82.6 85.2 84.8 87.2 85.8  PLT 190 182 278 179 175    Cardiac Enzymes:  Recent Labs Lab 05/29/15 1000  TROPONINI >65.00*    BNP: BNP (last 3 results) No results for input(s): BNP in the last 8760 hours.  ProBNP (last 3 results) No results for input(s): PROBNP in the last 8760 hours.    Other results:  Imaging: Ct Abdomen Pelvis Wo Contrast  06/05/2015  CLINICAL DATA:  Recent appendicitis with attempted removal. Cardiac arrest during  surgery with abortion of appendiceal removal. Concern for potential abscess. EXAM: CT ABDOMEN AND PELVIS WITHOUT CONTRAST TECHNIQUE: Multidetector CT imaging of the abdomen and pelvis was performed following the standard protocol without oral or intravenous contrast material administration. COMPARISON:  May 28, 2015 FINDINGS: Lower chest: There is bilateral lower lobe airspace consolidation. There is a small left pleural effusion. Hepatobiliary: No focal liver lesions are identified on this noncontrast enhanced study. There is contrast within the gallbladder. Gallbladder wall is not thickened. There is no biliary duct dilatation. Pancreas: No mass or inflammatory focus. Spleen: No splenic lesions are identified. Adrenals/Urinary Tract: Adrenals appear unremarkable bilaterally. The left kidney shows no evidence of mass or hydronephrosis. There is a 1 mm calculus in the mid to lower pole left kidney. There is no ureteral calculus on the left. On the right, there is no renal mass. There is moderate hydronephrosis on the right. There is no intrarenal calculus on the right. There is moderate hydronephrosis on the right, slightly less than on recent prior study. There is a 6 mm calculus in the proximal right ureter at the level of L4, a stable finding compared to the prior study. No new ureteral calculi identified. Urinary bladder is decompressed. Stomach/Bowel: There is no bowel obstruction. There is a small focus of pneumoperitoneum in the anterior right upper quadrant which is immediately adjacent to but felt to be separate from the ascending colon. There is no portal venous air. Nasogastric tube tube tip is in the stomach. Vascular/Lymphatic: There is atherosclerotic calcification in the aorta. There is no abdominal aortic aneurysm. No adenopathy is appreciable. Reproductive: There is artifact in the pelvis due to total hip prosthesis on the right. There does not appear to be prostatic or seminal vesicle  enlargement. Other: There is fluid surrounding the appendix. The appendix is less distended compared to the previous study. There is no abscess or evidence of perforation focally in the region of the appendix. Elsewhere no abscess is seen. There is moderate ascites in the abdomen and pelvis. There is generalized anasarca with extensive abdominal wall edema. Musculoskeletal: There is extensive degenerative change throughout the lower thoracic and lumbar spine. There are no blastic or lytic bone lesions. There are no intramuscular lesions. Total hip prosthesis noted on the right. IMPRESSION: Airspace consolidation in both lung bases. Small left pleural effusion. Widespread anasarca. In comparison with the recent prior study, there  is less appendiceal dilatation. There is localized periappendiceal fluid without frank abscess. No free air is seen near the appendix. A small amount of free air is noted in the right lower quadrant immediately adjacent to the ascending colon. It is difficult to ascertain whether this focal area of pneumoperitoneum is secondary to recent trocar placement versus appendiceal or viscus perforation. Moderate ascites. Contrast in gallbladder, likely vicarious excretion secondary to recent hypotensive episodes. Persistent hydronephrosis on the right with 6 mm calculus in the proximal right ureter at the level of L4. Small nonobstructing calculus lower pole left kidney. No bowel obstruction. No demonstrable abscess in the abdomen or pelvis. These results will be called to the ordering clinician or representative by the Radiologist Assistant, and communication documented in the PACS or zVision Dashboard. Electronically Signed   By: Bretta Bang III M.D.   On: 06/05/2015 07:32     Medications:     Scheduled Medications: . albumin human  25 g Intravenous Q6H  . anidulafungin  100 mg Intravenous Q24H  . antiseptic oral rinse  7 mL Mouth Rinse 10 times per day  . aspirin  81 mg Per Tube  Daily  . atorvastatin  80 mg Per NG tube q1800  . chlorhexidine gluconate  15 mL Mouth Rinse BID  . Chlorhexidine Gluconate Cloth  6 each Topical Q0600  . clopidogrel  75 mg Per NG tube Daily  . fentaNYL (SUBLIMAZE) injection  50 mcg Intravenous Once  . hydrocortisone sod succinate (SOLU-CORTEF) inj  100 mg Intravenous Q8H  . Influenza vac split quadrivalent PF  0.5 mL Intramuscular Tomorrow-1000  . insulin aspart  0-20 Units Subcutaneous 6 times per day  . meropenem (MERREM) IV  1 g Intravenous 3 times per day  . pantoprazole sodium  40 mg Per Tube Daily  . sodium chloride  10-40 mL Intracatheter Q12H  . sodium chloride  3 mL Intravenous Q12H  . vancomycin  1,500 mg Intravenous Q24H    Infusions: . sodium chloride 0 mL/hr at 06/03/15 1600  . sodium chloride Stopped (06/04/15 1200)  . amiodarone 60 mg/hr (06/05/15 0800)  . DOPamine Stopped (06/03/15 0805)  . fentaNYL infusion INTRAVENOUS 100 mcg/hr (06/05/15 0800)  . heparin 1,050 Units/hr (06/05/15 0800)  . midazolam (VERSED) infusion 2 mg/hr (06/05/15 0800)  . milrinone 0.375 mcg/kg/min (06/05/15 0800)  . norepinephrine (LEVOPHED) Adult infusion 22 mcg/min (06/05/15 0800)  . dialysis replacement fluid (prismasate) 500 mL/hr at 06/05/15 0459  . dialysis replacement fluid (prismasate) 300 mL/hr at 06/04/15 2205  . dialysate (PRISMASATE) 1,000 mL/hr at 06/05/15 0459  . vasopressin (PITRESSIN) infusion - *FOR SHOCK* 0.03 Units/min (06/05/15 0800)    PRN Medications: sodium chloride, fentaNYL, heparin, heparin, midazolam, midazolam, morphine injection, ondansetron **OR** ondansetron (ZOFRAN) IV, simethicone, sodium chloride, sodium chloride   Assessment:   1. Acute systolic HF with cardiogenic shock with biventricular dysfunction R>L.  EF 20-25%.  2. Acute inferior STEMI with RV involvement   --s/p PCI RCA on 12/2 3. Acute respiratory failure 4. AKI now on CVVHD 5. Acute appendicitis being treated medically 6. Recurrent  VT/VF. Torsades after Swan pulled. 7. New onset AF - back in NSR on amiodarone gtt 8. Cardiogenic/septic shock  Plan/Discussion:    Volume status improving with CVVHD but still overloaded, CVP 14-15 today.  Down 18 lbs yesterday but still well above baseline weight.  Will continue CVVHD.  Remains oliguric.    Cardiogenic + septic shock.  Ongoing pressor support to maintain BP for CVVHD (vasopressin/norepinephrine).  He is on milrinone, will follow co-ox now that Ernestine Conrad is out.   Reviewed CT today.  Localized peri-appendiceal fluid but no frank abscess.  Small amt free air RLQ may be from prior trochar placement (less likely perf).  Will continue current abx and fungal coverage.   Continue amio and heparin for AF. On DAPT for recent stent.   The patient is critically ill with multiple organ systems failure and requires high complexity decision making for assessment and support, frequent evaluation and titration of therapies, application of advanced monitoring technologies and extensive interpretation of multiple databases.   Critical Care Time devoted to patient care services described in this note is 35 Minutes  Marca Ancona 06/05/2015 8:28 AM  Length of Stay: 8   06/05/2015, 8:15 AM  Advanced Heart Failure Team Pager [phone] (M-F; 7a - 4p)  Please contact [facility] for night-coverage after hours (4p -7a ) and weekends on [email or web address]    .

## 2015-06-05 NOTE — Progress Notes (Addendum)
ANTICOAGULATION CONSULT NOTE - Follow Up Consult  Pharmacy Consult for heparin Indication: atrial fibrillation  No Known Allergies  Patient Measurements: Height: 5\' 10"  (177.8 cm) Weight: (!) 313 lb 15 oz (142.4 kg) IBW/kg (Calculated) : 73 Heparin Dosing Weight: 109 kg  Vital Signs: Temp: 97.9 F (36.6 C) (12/10 0800) Temp Source: Oral (12/10 0800) BP: 140/73 mmHg (12/10 0800) Pulse Rate: 87 (12/10 0414)  Labs:  Recent Labs  06/03/15 0805  06/04/15 0310  06/04/15 1143 06/04/15 1600 06/04/15 2032 06/05/15 0500  HGB 10.5*  --  9.9*  --   --   --   --  8.8*  HCT 30.8*  --  29.2*  --   --   --   --  26.0*  PLT 278  --  179  --   --   --   --  175  HEPARINUNFRC  --   --   --   < > 0.50  --  0.78* 0.62  CREATININE  --   < > 1.81*  1.80*  --   --  1.85*  --  2.00*  < > = values in this interval not displayed.  Estimated Creatinine Clearance: 56.7 mL/min (by C-G formula based on Cr of 2).   Assessment: 59 yo m with acute appendicitis and was taken to the OR for a laparoscopic appendectomy. In the OR he went into cardiac arrest. EKG showed CHB and ST elevations - Code STEMI was called. He was transferred to [facility] and taken emergently to the cath lab where he received a BMS to his RCA, temporary pacemaker and IABP were placed.   IABP has been pulled. Pharmacy is consulted to manage heparin. HL this AM is therapeutic at 0.62, hgb dropped a little 9.9>8.8, plts ok, no issues or bleeding per RN.  Goal of Therapy:  Heparin level 0.3-0.7 units/ml Monitor platelets by anticoagulation protocol: Yes   Plan:  Continue heparin infusion at 1050 units/hr 8-hr confirmatory HL @ 1300 Daily HL, CBC Monitor s/sx of bleeding  Cassie L. Roseanne Reno, PharmD Clinical Pharmacy Resident Pager: [phone] 06/05/2015 8:55 AM  Addendum: repeat HL was high at 0.75 on 1050 units/hr. Will decrease to 950 units/hr and recheck an 8-hr HL @ 2200.  No bleeding per RN.  Cassie L. Roseanne Reno,  PharmD Clinical Pharmacy Resident Pager: [phone] 06/05/2015 2:03 PM

## 2015-06-05 NOTE — Progress Notes (Signed)
Patient transported from 2H09 to CT and back with no complications. 

## 2015-06-05 NOTE — Progress Notes (Signed)
ANTICOAGULATION CONSULT NOTE  Pharmacy Consult for heparin Indication: atrial fibrillation  No Known Allergies  Patient Measurements: Height: 5\' 10"  (177.8 cm) Weight: (!) 313 lb 15 oz (142.4 kg) IBW/kg (Calculated) : 73 Heparin Dosing Weight: 109 kg  Vital Signs: Temp: 97.6 F (36.4 C) (12/10 2300) Temp Source: Oral (12/10 2300) BP: 132/71 mmHg (12/10 2200)  Labs:  Recent Labs  06/03/15 0805  06/04/15 0310  06/04/15 1600  06/05/15 0500 06/05/15 1250 06/05/15 1645 06/05/15 2200  HGB 10.5*  --  9.9*  --   --   --  8.8*  --   --   --   HCT 30.8*  --  29.2*  --   --   --  26.0*  --   --   --   PLT 278  --  179  --   --   --  175  --   --   --   HEPARINUNFRC  --   --   --   < >  --   < > 0.62 0.75*  --  0.67  CREATININE  --   < > 1.81*  1.80*  --  1.85*  --  2.00*  --  2.10*  --   < > = values in this interval not displayed.  Estimated Creatinine Clearance: 54 mL/min (by C-G formula based on Cr of 2.1).   Assessment: 59 y.o. male with Afib for heparin  Goal of Therapy:  Heparin level 0.3-0.7 units/ml Monitor platelets by anticoagulation protocol: Yes   Plan:  Continue Heparin at current rate   Geannie Risen, PharmD, BCPS

## 2015-06-06 ENCOUNTER — Inpatient Hospital Stay (HOSPITAL_COMMUNITY): Payer: Medicaid Other

## 2015-06-06 DIAGNOSIS — R062 Wheezing: Secondary | ICD-10-CM

## 2015-06-06 DIAGNOSIS — K353 Acute appendicitis with localized peritonitis, without perforation or gangrene: Secondary | ICD-10-CM | POA: Insufficient documentation

## 2015-06-06 LAB — RENAL FUNCTION PANEL
ALBUMIN: 2.9 g/dL — AB (ref 3.5–5.0)
ANION GAP: 10 (ref 5–15)
ANION GAP: 11 (ref 5–15)
Albumin: 2.7 g/dL — ABNORMAL LOW (ref 3.5–5.0)
BUN: 45 mg/dL — AB (ref 6–20)
BUN: 48 mg/dL — ABNORMAL HIGH (ref 6–20)
CALCIUM: 7.9 mg/dL — AB (ref 8.9–10.3)
CHLORIDE: 100 mmol/L — AB (ref 101–111)
CO2: 25 mmol/L (ref 22–32)
CO2: 24 mmol/L (ref 22–32)
Calcium: 8.1 mg/dL — ABNORMAL LOW (ref 8.9–10.3)
Chloride: 100 mmol/L — ABNORMAL LOW (ref 101–111)
Creatinine, Ser: 2.23 mg/dL — ABNORMAL HIGH (ref 0.61–1.24)
Creatinine, Ser: 2.33 mg/dL — ABNORMAL HIGH (ref 0.61–1.24)
GFR calc Af Amer: 35 mL/min — ABNORMAL LOW (ref >60)
GFR calc non Af Amer: 31 mL/min — ABNORMAL LOW (ref >60)
GFR calc non Af Amer: 29 mL/min — ABNORMAL LOW (ref >60)
GFR, EST AFRICAN AMERICAN: 34 mL/min — AB (ref >60)
GLUCOSE: 133 mg/dL — AB (ref 65–99)
Glucose, Bld: 135 mg/dL — ABNORMAL HIGH (ref 65–99)
PHOSPHORUS: 3.3 mg/dL (ref 2.5–4.6)
Phosphorus: 3.3 mg/dL (ref 2.5–4.6)
Potassium: 4.2 mmol/L (ref 3.5–5.1)
Potassium: 4.2 mmol/L (ref 3.5–5.1)
SODIUM: 135 mmol/L (ref 135–145)
Sodium: 135 mmol/L (ref 135–145)

## 2015-06-06 LAB — GLUCOSE, CAPILLARY
GLUCOSE-CAPILLARY: 129 mg/dL — AB (ref 65–99)
GLUCOSE-CAPILLARY: 103 mg/dL — AB (ref 65–99)
GLUCOSE-CAPILLARY: 113 mg/dL — AB (ref 65–99)
Glucose-Capillary: 126 mg/dL — ABNORMAL HIGH (ref 65–99)
Glucose-Capillary: 130 mg/dL — ABNORMAL HIGH (ref 65–99)
Glucose-Capillary: 132 mg/dL — ABNORMAL HIGH (ref 65–99)

## 2015-06-06 LAB — POCT I-STAT 3, ART BLOOD GAS (G3+)
BICARBONATE: 24.3 meq/L — AB (ref 20.0–24.0)
Bicarbonate: 24.5 mEq/L — ABNORMAL HIGH (ref 20.0–24.0)
O2 SAT: 94 %
O2 Saturation: 94 %
PH ART: 7.444 (ref 7.350–7.450)
PO2 ART: 67 mmHg — AB (ref 80.0–100.0)
TCO2: 26 mmol/L (ref 0–100)
TCO2: 25 mmol/L (ref 0–100)
pCO2 arterial: 35.5 mmHg (ref 35.0–45.0)
pCO2 arterial: 37.6 mmHg (ref 35.0–45.0)
pH, Arterial: 7.417 (ref 7.350–7.450)
pO2, Arterial: 68 mmHg — ABNORMAL LOW (ref 80.0–100.0)

## 2015-06-06 LAB — CARBOXYHEMOGLOBIN
Carboxyhemoglobin: 0.9 % (ref 0.5–1.5)
Methemoglobin: 0.9 % (ref 0.0–1.5)
O2 SAT: 82.4 %
Total hemoglobin: 7.6 g/dL — ABNORMAL LOW (ref 13.5–18.0)

## 2015-06-06 LAB — HEPARIN LEVEL (UNFRACTIONATED): Heparin Unfractionated: 0.56 IU/mL (ref 0.30–0.70)

## 2015-06-06 LAB — CBC
HEMATOCRIT: 23.1 % — AB (ref 39.0–52.0)
HEMOGLOBIN: 8.1 g/dL — AB (ref 13.0–17.0)
MCH: 29.7 pg (ref 26.0–34.0)
MCHC: 35.1 g/dL (ref 30.0–36.0)
MCV: 84.6 fL (ref 78.0–100.0)
Platelets: 164 10*3/uL (ref 150–400)
RBC: 2.73 MIL/uL — ABNORMAL LOW (ref 4.22–5.81)
RDW: 15.3 % (ref 11.5–15.5)
WBC: 20.8 10*3/uL — AB (ref 4.0–10.5)

## 2015-06-06 LAB — MAGNESIUM: MAGNESIUM: 2.7 mg/dL — AB (ref 1.7–2.4)

## 2015-06-06 MED ORDER — HYDROCORTISONE NA SUCCINATE PF 100 MG IJ SOLR
100.0000 mg | Freq: Two times a day (BID) | INTRAMUSCULAR | Status: DC
  Administered 2015-06-06 – 2015-06-09 (×6): 100 mg via INTRAVENOUS
  Filled 2015-06-06 (×6): qty 2

## 2015-06-06 MED ORDER — BUDESONIDE 0.5 MG/2ML IN SUSP
0.5000 mg | Freq: Two times a day (BID) | RESPIRATORY_TRACT | Status: DC
  Administered 2015-06-06 – 2015-06-09 (×6): 0.5 mg via RESPIRATORY_TRACT
  Filled 2015-06-06 (×6): qty 2

## 2015-06-06 MED ORDER — IPRATROPIUM-ALBUTEROL 0.5-2.5 (3) MG/3ML IN SOLN
3.0000 mL | RESPIRATORY_TRACT | Status: DC
  Administered 2015-06-06: 3 mL via RESPIRATORY_TRACT
  Filled 2015-06-06: qty 3

## 2015-06-06 MED ORDER — ARFORMOTEROL TARTRATE 15 MCG/2ML IN NEBU
15.0000 ug | INHALATION_SOLUTION | Freq: Two times a day (BID) | RESPIRATORY_TRACT | Status: DC
  Administered 2015-06-06 – 2015-06-09 (×6): 15 ug via RESPIRATORY_TRACT
  Filled 2015-06-06 (×6): qty 2

## 2015-06-06 NOTE — Progress Notes (Signed)
Patient ID: Scott Holden, male   DOB: 02-Aug-1955, 59 y.o.   MRN: [id number]    Advanced Heart Failure Rounding Note   Subjective:    Remains intubated/sedated on CVVHD.  Wakes up with weaning of sedation.  Still pulling 200-300cc/hr. Down 18 lbs again yesterday => CVP remains 12 this morning and still well above baseline weight.   Torsades 12/9 when Swan pulled.  Converted spontaneously without DCCV.  K and Mg ok, on amiodarone.  Septic shock with appendicitis.  On vanc, meropenum, and fungal coverage. CT abdomen 12/10 shows less appendiceal dilation than prior.  There is peri-appendiceal fluid without frank abscess.    IABP out.  Remains on milrinone as well as low-dose norepinephrine and vasopressin to facilitate fluid removal.  Getting albumin.    Objective:   Weight Range:  Vital Signs:   Temp:  [97.6 F (36.4 C)-99 F (37.2 C)] 98 F (36.7 C) (12/11 0800) Pulse Rate:  [89-90] 90 (12/10 1110) Resp:  [13-25] 17 (12/11 0800) BP: (104-135)/(56-72) 118/72 mmHg (12/11 0800) SpO2:  [90 %-100 %] 97 % (12/11 0800) Arterial Line BP: (101-158)/(43-67) 154/63 mmHg (12/11 0800) FiO2 (%):  [60 %] 60 % (12/11 0600) Weight:  [295 lb 10.2 oz (134.1 kg)] 295 lb 10.2 oz (134.1 kg) (12/11 0500) Last BM Date: 05/27/15  Weight change: Filed Weights   06/04/15 0407 06/05/15 0447 06/06/15 0500  Weight: 331 lb 12.7 oz (150.5 kg) 313 lb 15 oz (142.4 kg) 295 lb 10.2 oz (134.1 kg)    Intake/Output:   Intake/Output Summary (Last 24 hours) at 06/06/15 0817 Last data filed at 06/06/15 0800  Gross per 24 hour  Intake 3540.21 ml  Output  40981 ml  Net -7440.79 ml     Physical Exam: General:  Intubated sedated. HEENT: normal x for ETT Neck: supple. JVP difficult (thick neck). Cor: PMI nonpalpable. Regular rate & rhythm. No rubs, gallops or murmurs. Lungs: clear anteriorly Abdomen: Markedly obese. Soft. Hypoactive BS Extremities: Left groin cath site benign. 2+ edema to thighs bilaterally.  HD catheter in R groin.  Neuro: sedated no focal  Telemetry: NSR 90-100s  Labs: Basic Metabolic Panel:  Recent Labs Lab 06/02/15 0410  06/03/15 0416  06/04/15 0310 06/04/15 1600 06/05/15 0500 06/05/15 1645 06/06/15 0345  NA 126*  126*  < > 129*  < > 130*  131* 133* 134* 134* 135  K 4.0  4.0  < > 4.1  < > 4.5  4.4 4.6 4.3 4.2 4.2  CL 86*  86*  < > 90*  < > 97*  96* 97* 97* 99* 100*  CO2 31  31  < > 29  < > 29  29 27 24 24 25   GLUCOSE 162*  160*  < > 150*  < > 149*  150* 132* 143* 133* 133*  BUN 45*  45*  < > 38*  < > 38*  37* 37* 41* 42* 45*  CREATININE 1.73*  1.74*  < > 1.50*  < > 1.81*  1.80* 1.85* 2.00* 2.10* 2.23*  CALCIUM 6.2*  6.2*  < > 6.5*  < > 6.7*  6.7* 7.0* 7.0* 7.6* 7.9*  MG 2.0  --  2.3  --  2.4  --  2.6*  --  2.7*  PHOS 4.8*  < > 4.4  < > 4.2 4.1 4.3 3.6 3.3  < > = values in this interval not displayed.  Liver Function Tests:  Recent Labs Lab 06/04/15 0310 06/04/15 1600 06/05/15 0500  06/05/15 1645 06/06/15 0345  ALBUMIN 1.7* 2.1* 2.4* 2.7* 2.9*   No results for input(s): LIPASE, AMYLASE in the last 168 hours. No results for input(s): AMMONIA in the last 168 hours.  CBC:  Recent Labs Lab 06/02/15 0410 06/03/15 0805 06/04/15 0310 06/05/15 0500 06/06/15 0345  WBC 27.1* 25.1* 22.2* 23.2* 20.8*  NEUTROABS  --  22.5*  --   --   --   HGB 11.4* 10.5* 9.9* 8.8* 8.1*  HCT 32.7* 30.8* 29.2* 26.0* 23.1*  MCV 85.2 84.8 87.2 85.8 84.6  PLT 182 278 179 175 164    Cardiac Enzymes: No results for input(s): CKTOTAL, CKMB, CKMBINDEX, TROPONINI in the last 168 hours.  BNP: BNP (last 3 results) No results for input(s): BNP in the last 8760 hours.  ProBNP (last 3 results) No results for input(s): PROBNP in the last 8760 hours.    Other results:  Imaging: Ct Abdomen Pelvis Wo Contrast  06/05/2015  CLINICAL DATA:  Recent appendicitis with attempted removal. Cardiac arrest during surgery with abortion of appendiceal removal. Concern for  potential abscess. EXAM: CT ABDOMEN AND PELVIS WITHOUT CONTRAST TECHNIQUE: Multidetector CT imaging of the abdomen and pelvis was performed following the standard protocol without oral or intravenous contrast material administration. COMPARISON:  2015-06-17 FINDINGS: Lower chest: There is bilateral lower lobe airspace consolidation. There is a small left pleural effusion. Hepatobiliary: No focal liver lesions are identified on this noncontrast enhanced study. There is contrast within the gallbladder. Gallbladder wall is not thickened. There is no biliary duct dilatation. Pancreas: No mass or inflammatory focus. Spleen: No splenic lesions are identified. Adrenals/Urinary Tract: Adrenals appear unremarkable bilaterally. The left kidney shows no evidence of mass or hydronephrosis. There is a 1 mm calculus in the mid to lower pole left kidney. There is no ureteral calculus on the left. On the right, there is no renal mass. There is moderate hydronephrosis on the right. There is no intrarenal calculus on the right. There is moderate hydronephrosis on the right, slightly less than on recent prior study. There is a 6 mm calculus in the proximal right ureter at the level of L4, a stable finding compared to the prior study. No new ureteral calculi identified. Urinary bladder is decompressed. Stomach/Bowel: There is no bowel obstruction. There is a small focus of pneumoperitoneum in the anterior right upper quadrant which is immediately adjacent to but felt to be separate from the ascending colon. There is no portal venous air. Nasogastric tube tube tip is in the stomach. Vascular/Lymphatic: There is atherosclerotic calcification in the aorta. There is no abdominal aortic aneurysm. No adenopathy is appreciable. Reproductive: There is artifact in the pelvis due to total hip prosthesis on the right. There does not appear to be prostatic or seminal vesicle enlargement. Other: There is fluid surrounding the appendix. The  appendix is less distended compared to the previous study. There is no abscess or evidence of perforation focally in the region of the appendix. Elsewhere no abscess is seen. There is moderate ascites in the abdomen and pelvis. There is generalized anasarca with extensive abdominal wall edema. Musculoskeletal: There is extensive degenerative change throughout the lower thoracic and lumbar spine. There are no blastic or lytic bone lesions. There are no intramuscular lesions. Total hip prosthesis noted on the right. IMPRESSION: Airspace consolidation in both lung bases. Small left pleural effusion. Widespread anasarca. In comparison with the recent prior study, there is less appendiceal dilatation. There is localized periappendiceal fluid without frank abscess. No  free air is seen near the appendix. A small amount of free air is noted in the right lower quadrant immediately adjacent to the ascending colon. It is difficult to ascertain whether this focal area of pneumoperitoneum is secondary to recent trocar placement versus appendiceal or viscus perforation. Moderate ascites. Contrast in gallbladder, likely vicarious excretion secondary to recent hypotensive episodes. Persistent hydronephrosis on the right with 6 mm calculus in the proximal right ureter at the level of L4. Small nonobstructing calculus lower pole left kidney. No bowel obstruction. No demonstrable abscess in the abdomen or pelvis. These results will be called to the ordering clinician or representative by the Radiologist Assistant, and communication documented in the PACS or zVision Dashboard. Electronically Signed   By: Bretta Bang III M.D.   On: 06/05/2015 07:32   Dg Abd Portable 1v  06/05/2015  CLINICAL DATA:  Feeding tube placement EXAM: PORTABLE ABDOMEN - 1 VIEW COMPARISON:  06/05/2015, CT scan abdomen and pelvis. FINDINGS: NG tube with tip in distal stomach again noted. There is a NG feeding tube with tip within duodenum. IMPRESSION: NG  tube with tip in distal stomach again noted. There is a NG feeding tube with tip within duodenum. Electronically Signed   By: Natasha Mead M.D.   On: 06/05/2015 15:21     Medications:     Scheduled Medications: . anidulafungin  100 mg Intravenous Q24H  . antiseptic oral rinse  7 mL Mouth Rinse 10 times per day  . aspirin  81 mg Per Tube Daily  . atorvastatin  80 mg Per NG tube q1800  . chlorhexidine gluconate  15 mL Mouth Rinse BID  . Chlorhexidine Gluconate Cloth  6 each Topical Q0600  . clopidogrel  75 mg Per NG tube Daily  . feeding supplement (VITAL HIGH PROTEIN)  1,000 mL Per Tube Q24H  . fentaNYL (SUBLIMAZE) injection  50 mcg Intravenous Once  . hydrocortisone sod succinate (SOLU-CORTEF) inj  100 mg Intravenous Q8H  . Influenza vac split quadrivalent PF  0.5 mL Intramuscular Tomorrow-1000  . insulin aspart  0-20 Units Subcutaneous 6 times per day  . meropenem (MERREM) IV  1 g Intravenous 3 times per day  . pantoprazole sodium  40 mg Per Tube Daily  . sodium chloride  10-40 mL Intracatheter Q12H  . vancomycin  1,500 mg Intravenous Q24H    Infusions: . sodium chloride 0 mL/hr at 06/03/15 1600  . sodium chloride Stopped (06/04/15 1200)  . amiodarone 60 mg/hr (06/06/15 0300)  . feeding supplement (VITAL HIGH PROTEIN) 1,000 mL (06/06/15 0600)  . fentaNYL infusion INTRAVENOUS 100 mcg/hr (06/06/15 0342)  . heparin 950 Units/hr (06/05/15 1828)  . midazolam (VERSED) infusion 4 mg/hr (06/05/15 2354)  . milrinone 0.375 mcg/kg/min (06/06/15 0312)  . norepinephrine (LEVOPHED) Adult infusion 3 mcg/min (06/06/15 0100)  . dialysis replacement fluid (prismasate) 500 mL/hr at 06/06/15 0112  . dialysis replacement fluid (prismasate) 300 mL/hr at 06/05/15 1652  . dialysate (PRISMASATE) 1,000 mL/hr at 06/06/15 0350  . vasopressin (PITRESSIN) infusion - *FOR SHOCK* 0.03 Units/min (06/05/15 1412)    PRN Medications: sodium chloride, fentaNYL, heparin, heparin, midazolam, midazolam, morphine  injection, ondansetron **OR** ondansetron (ZOFRAN) IV, simethicone, sodium chloride   Assessment:   1. Acute systolic HF with cardiogenic shock with biventricular dysfunction R>L.  EF 20-25%.  2. Acute inferior STEMI with RV involvement   --s/p PCI RCA on 12/2 3. Acute respiratory failure 4. AKI now on CVVHD 5. Acute appendicitis being treated medically 6. Recurrent VT/VF. Torsades after  Swan pulled. 7. New onset AF - back in NSR on amiodarone gtt 8. Cardiogenic/septic shock  Plan/Discussion:    Volume status improving with CVVHD but still overloaded, CVP 12 today, good co-ox.  Down 18 lbs again yesterday but still well above baseline weight.  Will continue CVVHD.  Remains oliguric.    Cardiogenic + septic shock.  He is on milrinone with good co-ox.  I think that we can stop vasopressin today.    CT abdomen 12/10: Localized peri-appendiceal fluid but no frank abscess.  Will continue current abx and fungal coverage.   Continue amio and heparin for AF, can decrease amiodarone to 30 mg/hr. On DAPT for recent stent.   The patient is critically ill with multiple organ systems failure and requires high complexity decision making for assessment and support, frequent evaluation and titration of therapies, application of advanced monitoring technologies and extensive interpretation of multiple databases.   Marca Ancona 06/06/2015 8:17 AM  Length of Stay: 9   06/06/2015, 8:17 AM  Advanced Heart Failure Team Pager [phone] (M-F; 7a - 4p)  Please contact [facility] for night-coverage after hours (4p -7a ) and weekends on [email or web address]    .

## 2015-06-06 NOTE — Progress Notes (Signed)
9 Days Post-Op  Subjective: Intubated, tol tube feeds, more out og but has pp tube  Objective: Vital signs in last 24 hours: Temp:  [97.6 F (36.4 C)-99 F (37.2 C)] 98.1 F (36.7 C) (12/11 0700) Pulse Rate:  [89-90] 90 (12/10 1110) Resp:  [13-25] 13 (12/11 0700) BP: (104-135)/(56-71) 113/59 mmHg (12/11 0400) SpO2:  [90 %-100 %] 99 % (12/11 0700) Arterial Line BP: (101-158)/(43-67) 141/59 mmHg (12/11 0700) FiO2 (%):  [60 %] 60 % (12/11 0600) Weight:  [134.1 kg (295 lb 10.2 oz)] 134.1 kg (295 lb 10.2 oz) (12/11 0500) Last BM Date: 05/27/15  Intake/Output from previous day: 12/10 0701 - 12/11 0700 In: 3645.4 [I.V.:2390.1; NG/GT:255.3; IV Piggyback:1000] Out: 16109 [Urine:60; Emesis/NG output:550] Intake/Output this shift: Total I/O In: 100 [IV Piggyback:100] Out: -   GI: incisions clean, nondistended  Lab Results:   Recent Labs  06/05/15 0500 06/06/15 0345  WBC 23.2* 20.8*  HGB 8.8* 8.1*  HCT 26.0* 23.1*  PLT 175 164   BMET  Recent Labs  06/05/15 1645 06/06/15 0345  NA 134* 135  K 4.2 4.2  CL 99* 100*  CO2 24 25  GLUCOSE 133* 133*  BUN 42* 45*  CREATININE 2.10* 2.23*  CALCIUM 7.6* 7.9*   PT/INR No results for input(s): LABPROT, INR in the last 72 hours. ABG  Recent Labs  06/03/15 0900 06/04/15 1200  PHART 7.386 7.342*  HCO3 25.3* 24.7*    Studies/Results: Ct Abdomen Pelvis Wo Contrast  06/05/2015  CLINICAL DATA:  Recent appendicitis with attempted removal. Cardiac arrest during surgery with abortion of appendiceal removal. Concern for potential abscess. EXAM: CT ABDOMEN AND PELVIS WITHOUT CONTRAST TECHNIQUE: Multidetector CT imaging of the abdomen and pelvis was performed following the standard protocol without oral or intravenous contrast material administration. COMPARISON:  May 28, 2015 FINDINGS: Lower chest: There is bilateral lower lobe airspace consolidation. There is a small left pleural effusion. Hepatobiliary: No focal liver lesions  are identified on this noncontrast enhanced study. There is contrast within the gallbladder. Gallbladder wall is not thickened. There is no biliary duct dilatation. Pancreas: No mass or inflammatory focus. Spleen: No splenic lesions are identified. Adrenals/Urinary Tract: Adrenals appear unremarkable bilaterally. The left kidney shows no evidence of mass or hydronephrosis. There is a 1 mm calculus in the mid to lower pole left kidney. There is no ureteral calculus on the left. On the right, there is no renal mass. There is moderate hydronephrosis on the right. There is no intrarenal calculus on the right. There is moderate hydronephrosis on the right, slightly less than on recent prior study. There is a 6 mm calculus in the proximal right ureter at the level of L4, a stable finding compared to the prior study. No new ureteral calculi identified. Urinary bladder is decompressed. Stomach/Bowel: There is no bowel obstruction. There is a small focus of pneumoperitoneum in the anterior right upper quadrant which is immediately adjacent to but felt to be separate from the ascending colon. There is no portal venous air. Nasogastric tube tube tip is in the stomach. Vascular/Lymphatic: There is atherosclerotic calcification in the aorta. There is no abdominal aortic aneurysm. No adenopathy is appreciable. Reproductive: There is artifact in the pelvis due to total hip prosthesis on the right. There does not appear to be prostatic or seminal vesicle enlargement. Other: There is fluid surrounding the appendix. The appendix is less distended compared to the previous study. There is no abscess or evidence of perforation focally in the region of the  appendix. Elsewhere no abscess is seen. There is moderate ascites in the abdomen and pelvis. There is generalized anasarca with extensive abdominal wall edema. Musculoskeletal: There is extensive degenerative change throughout the lower thoracic and lumbar spine. There are no blastic  or lytic bone lesions. There are no intramuscular lesions. Total hip prosthesis noted on the right. IMPRESSION: Airspace consolidation in both lung bases. Small left pleural effusion. Widespread anasarca. In comparison with the recent prior study, there is less appendiceal dilatation. There is localized periappendiceal fluid without frank abscess. No free air is seen near the appendix. A small amount of free air is noted in the right lower quadrant immediately adjacent to the ascending colon. It is difficult to ascertain whether this focal area of pneumoperitoneum is secondary to recent trocar placement versus appendiceal or viscus perforation. Moderate ascites. Contrast in gallbladder, likely vicarious excretion secondary to recent hypotensive episodes. Persistent hydronephrosis on the right with 6 mm calculus in the proximal right ureter at the level of L4. Small nonobstructing calculus lower pole left kidney. No bowel obstruction. No demonstrable abscess in the abdomen or pelvis. These results will be called to the ordering clinician or representative by the Radiologist Assistant, and communication documented in the PACS or zVision Dashboard. Electronically Signed   By: Bretta Bang III M.D.   On: 06/05/2015 07:32   Dg Abd Portable 1v  06/05/2015  CLINICAL DATA:  Feeding tube placement EXAM: PORTABLE ABDOMEN - 1 VIEW COMPARISON:  06/05/2015, CT scan abdomen and pelvis. FINDINGS: NG tube with tip in distal stomach again noted. There is a NG feeding tube with tip within duodenum. IMPRESSION: NG tube with tip in distal stomach again noted. There is a NG feeding tube with tip within duodenum. Electronically Signed   By: Natasha Mead M.D.   On: 06/05/2015 15:21    Anti-infectives: Anti-infectives    Start     Dose/Rate Route Frequency Ordered Stop   06/03/15 0800  anidulafungin (ERAXIS) 100 mg in sodium chloride 0.9 % 100 mL IVPB     100 mg over 90 Minutes Intravenous Every 24 hours 06/02/15 0730      06/03/15 0600  vancomycin (VANCOCIN) 1,500 mg in sodium chloride 0.9 % 500 mL IVPB     1,500 mg 250 mL/hr over 120 Minutes Intravenous Every 24 hours 06/02/15 0807     06/02/15 1600  meropenem (MERREM) 1 g in sodium chloride 0.9 % 100 mL IVPB  Status:  Discontinued     1 g 200 mL/hr over 30 Minutes Intravenous Every 12 hours 06/02/15 0449 06/02/15 0921   06/02/15 1400  meropenem (MERREM) 1 g in sodium chloride 0.9 % 100 mL IVPB     1 g 200 mL/hr over 30 Minutes Intravenous 3 times per day 06/02/15 0921     06/02/15 0815  vancomycin (VANCOCIN) 500 mg in sodium chloride 0.9 % 100 mL IVPB     500 mg 100 mL/hr over 60 Minutes Intravenous NOW 06/02/15 0807 06/02/15 1020   06/02/15 0800  anidulafungin (ERAXIS) 200 mg in sodium chloride 0.9 % 200 mL IVPB     200 mg over 180 Minutes Intravenous  Once 06/02/15 0730 06/02/15 1139   05/30/15 0600  vancomycin (VANCOCIN) 1,250 mg in sodium chloride 0.9 % 250 mL IVPB  Status:  Discontinued     1,250 mg 166.7 mL/hr over 90 Minutes Intravenous Every 24 hours 05/29/15 0237 06/02/15 0807   05/29/15 1200  meropenem (MERREM) 1 g in sodium chloride 0.9 % 100  mL IVPB  Status:  Discontinued     1 g 200 mL/hr over 30 Minutes Intravenous Every 8 hours 05/29/15 0237 06/02/15 0449   05/29/15 0600  piperacillin-tazobactam (ZOSYN) IVPB 3.375 g  Status:  Discontinued     3.375 g 12.5 mL/hr over 240 Minutes Intravenous 3 times per day Jun 18, 2015 2256 05/29/15 0216   05/29/15 0245  vancomycin (VANCOCIN) 2,500 mg in sodium chloride 0.9 % 500 mL IVPB     2,500 mg 250 mL/hr over 120 Minutes Intravenous  Once 05/29/15 0237 05/29/15 0612   05/29/15 0245  meropenem (MERREM) 1 g in sodium chloride 0.9 % 100 mL IVPB     1 g 200 mL/hr over 30 Minutes Intravenous  Once 05/29/15 0237 05/29/15 0339   June 18, 2015 2315  piperacillin-tazobactam (ZOSYN) IVPB 3.375 g     3.375 g 100 mL/hr over 30 Minutes Intravenous  Once 06-18-2015 2300 05/29/15 0124   Jun 18, 2015 0830  cefTRIAXone  (ROCEPHIN) 2 g in dextrose 5 % 50 mL IVPB  Status:  Discontinued     2 g 100 mL/hr over 30 Minutes Intravenous Every 24 hours 06/18/15 0815 06-18-15 2256   June 18, 2015 0830  metroNIDAZOLE (FLAGYL) IVPB 500 mg  Status:  Discontinued     500 mg 100 mL/hr over 60 Minutes Intravenous Every 8 hours 2015-06-18 0815 06/18/15 2256   06/18/15 0715  piperacillin-tazobactam (ZOSYN) IVPB 3.375 g     3.375 g 12.5 mL/hr over 240 Minutes Intravenous  Once 06/18/15 0700 2015/06/18 7425      Assessment/Plan: Appendicitis s/p aborted appy secondary to arrest  Continue abx Will follow  [facility] 06/06/2015

## 2015-06-06 NOTE — Progress Notes (Signed)
Pharmacy Antibiotic Follow-up Note  Scott Holden is a 59 y.o. year-old male admitted on 05/27/2015.  The patient is currently on day 9 of vancomycin and meropenem for sepsis due to gangrenous appendix s/p aborted appendectomy d/t cardiac arrest. He is also on day 5 of anidulafungin.  Assessment/Plan: This patient's current antibiotics will be continued without adjustments. Please consider de-escalating again soon with CT showing no signs of abscess or perforation.   Temp (24hrs), Avg:98.2 F (36.8 C), Min:97.6 F (36.4 C), Max:99 F (37.2 C)   Recent Labs Lab 06/02/15 0410 06/03/15 0805 06/04/15 0310 06/05/15 0500 06/06/15 0345  WBC 27.1* 25.1* 22.2* 23.2* 20.8*    Recent Labs Lab 06/04/15 0310 06/04/15 1600 06/05/15 0500 06/05/15 1645 06/06/15 0345  CREATININE 1.81*  1.80* 1.85* 2.00* 2.10* 2.23*   Estimated Creatinine Clearance: 49.1 mL/min (by C-G formula based on Cr of 2.23).    No Known Allergies  Antimicrobials this admission: Vancomycin 12/3 >>  Meropenem 12/3 >>  Anidulafungin 12/7 >>  Levels/dose changes this admission: 12/8 VT: 14 on 1500 mg IV q24h (CRRT dosing)  Microbiology results: No cultures have been drawn MRSA PCR: positive  Thank you for allowing pharmacy to be a part of this patient's care.  Cassie L. Roseanne Reno, PharmD PGY2 Infectious Diseases Pharmacy Resident Pager: [phone] 06/06/2015 11:26 AM

## 2015-06-06 NOTE — Progress Notes (Signed)
ANTICOAGULATION CONSULT NOTE - Follow Up Consult  Pharmacy Consult for heparin Indication: atrial fibrillation  No Known Allergies  Patient Measurements: Height: 5\' 10"  (177.8 cm) Weight: 295 lb 10.2 oz (134.1 kg) IBW/kg (Calculated) : 73 Heparin Dosing Weight: 109 kg  Vital Signs: Temp: 98 F (36.7 C) (12/11 0800) Temp Source: Oral (12/11 0800) BP: 103/49 mmHg (12/11 1016) Pulse Rate: 92 (12/11 1016)  Labs:  Recent Labs  06/04/15 0310  06/05/15 0500 06/05/15 1250 06/05/15 1645 06/05/15 2200 06/06/15 0345  HGB 9.9*  --  8.8*  --   --   --  8.1*  HCT 29.2*  --  26.0*  --   --   --  23.1*  PLT 179  --  175  --   --   --  164  HEPARINUNFRC  --   < > 0.62 0.75*  --  0.67 0.56  CREATININE 1.81*  1.80*  < > 2.00*  --  2.10*  --  2.23*  < > = values in this interval not displayed.  Estimated Creatinine Clearance: 49.1 mL/min (by C-G formula based on Cr of 2.23).  Assessment: 59 yo m with acute appendicitis and was taken to the OR for a laparoscopic appendectomy. In the OR he went into cardiac arrest. EKG showed CHB and ST elevations - Code STEMI was called. He was transferred to [facility] and taken emergently to the cath lab where he received a BMS to his RCA, temporary pacemaker and IABP were placed.   IABP has been pulled. Pharmacy is consulted to manage heparin. HL has been therapeutic x 2 on 950 units/hr. Hgb dropped a little again today 9.9>8.8>8.1, plts ok, no issues or bleeding per RN.  Goal of Therapy:  Heparin level 0.3-0.7 units/ml Monitor platelets by anticoagulation protocol: Yes   Plan:  Continue heparin infusion at 950 units/hr Daily HL, CBC Monitor for s/sx of bleeding  Ryleigh Esqueda L. Roseanne Reno, PharmD Clinical Pharmacy Resident Pager: [phone] 06/06/2015 11:23 AM

## 2015-06-06 NOTE — Progress Notes (Signed)
[facility] Rounding Note  Events of last 24 hours: CRRT no issues (All 4K/500pre/300post/1 liter/hour dialyisate/no heparin (on systemic)) Still tolerating 200-300 per hour off 27.6  kg down (about 15 to go) Essentially anuric (muddy brown urine, minimal amt)  Oozing from groin HD cath site resolved Pressors coming down TF started  Objective:    Vital signs in last 24 hours: Filed Vitals:   06/06/15 0600 06/06/15 0700 06/06/15 0800 06/06/15 0900  BP:   118/72   Pulse:      Temp:  98.1 F (36.7 C) 98 F (36.7 C)   TempSrc:  Oral Oral   Resp: Height:      Weight:      SpO2: 99% 99% 97% 99%   Weight change: -8.3 kg (-18 lb 4.8 oz)  Intake/Output Summary (Last 24 hours) at 06/06/15 0957 Last data filed at 06/06/15 0900  Gross per 24 hour  Intake 3526.01 ml  Output  16109 ml  Net -7718.99 ml     Physical Exam:  Blood pressure 118/72, pulse 90, temperature 98 F (36.7 C), temperature source Oral, resp. rate 15, height  (1.778 m), weight 134.1 kg (295 lb 10.2 oz), SpO2 99 %. CVP 12 General appearance: Intubated and sedated.  Multiple lines. HD cath R groin. Gross anasarca Resp: Anteriorly clear Cardio: Very distant, no rub, regular rhythm GI: obese, distended/tense, few tinkles heard today Extremities: edema 2+  Scrotal edema reduced + hernia R fem dialysis catheter (12/6) No changes of atheroemboli Follows some commands (squeezes hand)   Weight trending: 12/2   119.25 kg      Admission 12/6   160 kg           CRRT started (late PM) 12/7   161.7 kg 12/8   155.3 kg 12/9   150.5 kg 12/10 142.4 kg 12/11 134.1 kg  Labs:   Recent Labs Lab 06/03/15 0416 06/03/15 1603 06/04/15 0310 06/04/15 1600 06/05/15 0500 06/05/15 1645 06/06/15 0345  NA 129* 131* 130*  131* 133* 134* 134* 135  K 4.1 4.1 4.5  4.4 4.6 4.3 4.2 4.2  CL 90* 95* 97*  96* 97* 97* 99* 100*  CO2 GLUCOSE 150* 116* 149*  150*  132* 143* 133* 133*  BUN 38* 38* 38*  37* 37* 41* 42* 45*  CREATININE 1.50* 1.59* 1.81*  1.80* 1.85* 2.00* 2.10* 2.23*  CALCIUM 6.5* 6.5* 6.7*  6.7* 7.0* 7.0* 7.6* 7.9*  PHOS 4.4 4.0 4.2 4.1 4.3 3.6 3.3     Recent Labs Lab 06/05/15 0500 06/05/15 1645 06/06/15 0345  ALBUMIN 2.4* 2.7* 2.9*     Recent Labs Lab 06/03/15 0805 06/04/15 0310 06/05/15 0500 06/06/15 0345  WBC 25.1* 22.2* 23.2* 20.8*  NEUTROABS 22.5*  --   --   --   HGB 10.5* 9.9* 8.8* 8.1*  HCT 30.8* 29.2* 26.0* 23.1*  MCV 84.8 87.2 85.8 84.6  PLT 278 179 175 164     Recent Labs Lab 06/05/15 1657 06/05/15 2001 06/05/15 2343 06/06/15 0337 06/06/15 0752  GLUCAP 130* 124* 132* 129* 103*    Medications . sodium chloride 0 mL/hr at 06/03/15 1600  . sodium chloride Stopped (06/04/15 1200)  . amiodarone 30 mg/hr (06/06/15 0957)  . feeding supplement (VITAL HIGH PROTEIN) 1,000 mL (06/06/15 0600)  . fentaNYL infusion INTRAVENOUS 100 mcg/hr (06/06/15 0342)  . heparin 950 Units/hr (06/05/15 1828)  . midazolam (VERSED) infusion  4 mg/hr (06/06/15 0956)  . milrinone 0.375 mcg/kg/min (06/06/15 0957)  . norepinephrine (LEVOPHED) Adult infusion 3 mcg/min (06/06/15 0100)  . dialysis replacement fluid (prismasate) 500 mL/hr at 06/06/15 0112  . dialysis replacement fluid (prismasate) 300 mL/hr at 06/05/15 1652  . dialysate (PRISMASATE) 1,000 mL/hr at 06/06/15 0350  . vasopressin (PITRESSIN) infusion - *FOR SHOCK* 0.03 Units/min (06/05/15 1412)   . anidulafungin  100 mg Intravenous Q24H  . antiseptic oral rinse  7 mL Mouth Rinse 10 times per day  . aspirin  81 mg Per Tube Daily  . atorvastatin  80 mg Per NG tube q1800  . chlorhexidine gluconate  15 mL Mouth Rinse BID  . Chlorhexidine Gluconate Cloth  6 each Topical Q0600  . clopidogrel  75 mg Per NG tube Daily  . feeding supplement (VITAL HIGH PROTEIN)  1,000 mL Per Tube Q24H  . fentaNYL (SUBLIMAZE) injection  50 mcg Intravenous Once  . hydrocortisone sod  succinate (SOLU-CORTEF) inj  100 mg Intravenous Q8H  . Influenza vac split quadrivalent PF  0.5 mL Intramuscular Tomorrow-1000  . insulin aspart  0-20 Units Subcutaneous 6 times per day  . meropenem (MERREM) IV  1 g Intravenous 3 times per day  . pantoprazole sodium  40 mg Per Tube Daily  . sodium chloride  10-40 mL Intracatheter Q12H  . vancomycin  1,500 mg Intravenous Q24H    Background: 59 yo M with PMH sig for HTN, DM, nephrolithiasis, tobacco abuse, and TIA's who presented to [facility] on 06/12/2015 with right sided abdominal pain and was found to have acute appendicitis. He went for surgery, however after the first trocar was introduced, he suffered a VT cardiac arrest on the table (surgery aborted). Transferred to [facility] after a code STEMI was called. Cath with DES to RCA and  insertion of an IABP for MI and cardiogenic shock, started on pressors and transferred to CCU. Scr peaked at 2.24 following cath and was improving until 12/6 when his UOP dropped, O2 demands increased. Noted to be 40L +. We were consulted to further evaluate ARF and provide CRRT for volume management. In addition CT scan revealed hydro of right ureter with evidence of 6x69mm calculus with bilateral  hydronephrosis (marked hydro on R).CRRT initiated 12/6.   Assessment/Recommendations  1. ARF, oligoanuric in setting of cardiogenic shock/sepsis and IV contrast, obstructive uropathy (Initial CT 12/2  bilateral hydro - repeat yesterday just R hydro/ureteral stone).  CRRT initiated 12/6 (via temp R groin catheter) . Volume 40+ kg up prior to CRRT initiation. Down 27.6  kg as of today. Tolerating fluid removal at neg 200-300/hour (and pressors still coming down).  Gave 6 doses of  albumin for oncotic pressure - mobilization of 3rd spaced fluids . CVP 12 today. Continue same prescription.  (Total effluent dose only 15 ml/kg/hour but appears adequate for clearance, and K/phos quite stable and not requiring replacement).    2. Nephrolithiasis with obstructive uropathy/right stone but bilateral hydronephrosis on CT from 12/2. CT 12/9 showed resolution of left hydro, persistence of right hydro with a proximal ureteral calculus.  Not stable for any procedure at this time to decompress right kidney. Continue with supportive measures (at some point will have to be addressed)  3. Acute inferior STEMI with RV involvement/acute systolic heart failure/RV infarct/VF arrest- s/p PTCA and DES placement in RCA 12/2 complicated by cardiogenic shock requiring  IABP/pressors/inotropes. Balloon pump out 12/8, pressors have come down some more in the  last 24 hours.  4. VT/VF/AF -  Balloon pump out 12/8.  Swan pulled 12/9 - torsades -> spont conversion and no recurrence. NSR amio. Heparin drip.  5. Acute appendicitis - appy aborted d/t cardiac arrest/critical illness. Continue with antibiotics. CT 12/9 showed fluid surrounding appendix, but less distension, no abscess or evidence of perforation. Meropenem, vanco, anidulafungin. Cultures negative.  6. VDRF with massive  volume overload.O2 requirement still at 60%, PEEP down 18->12.  Volume coming down.  7. SIRS/multi-organ failure- cardiogenic and sepsit. On pressors, antibiotics (Meropenem, vanco, anidulafungin. WBC down slightly  8. MRSA PCR +   Camille Bal, MD [facility] [phone] Pager 06/06/2015, 9:57 AM

## 2015-06-06 NOTE — Progress Notes (Signed)
PULMONARY / CRITICAL CARE MEDICINE   Name: Scott Holden MRN: [id number] DOB: Sep 25, 1955    ADMISSION DATE:  06-19-15 CONSULTATION DATE:  19-Jun-2015   REFERRING MD :  Dr. Clifton James  CHIEF COMPLAINT:  Cardiogenic and septic shock  INITIAL PRESENTATION:  31M presented to [facility] with several days of progressively worsening RLQ pain found to have an acute appendicitis.  He went to the OR and suffered a VT arrest on the table after the insertion of the trochar.  He was transferred to [facility] after a code STEMI was called and the surgical intervention was aborted.  In the cath lab an RCA DES was placed and an IABP inserted for MI and cardiogenic shock.  While in cath lab, he required shock x6 for VF arrest and post return to ICU, shock x5.     SUBJECTIVE:  Sedated on vent; looks a little better but WOB and vent synchrony a challenge   VITAL SIGNS: Temp:  [97.6 F (36.4 C)-99 F (37.2 C)] 98 F (36.7 C) (12/11 0800) Pulse Rate:  [92] 92 (12/11 1016) Resp:  [13-26] 26 (12/11 1016) BP: (103-132)/(49-72) 103/49 mmHg (12/11 1016) SpO2:  [90 %-100 %] 96 % (12/11 1016) Arterial Line BP: (101-158)/(43-67) 108/51 mmHg (12/11 0900) FiO2 (%):  [60 %] 60 % (12/11 1016) Weight:  [134.1 kg (295 lb 10.2 oz)] 134.1 kg (295 lb 10.2 oz) (12/11 0500)   HEMODYNAMICS: CVP:  [9 mmHg-16 mmHg] 11 mmHg   VENTILATOR SETTINGS: Vent Mode:  [-] PCV FiO2 (%):  [60 %] 60 % Set Rate:  [20 bmp] 20 bmp PEEP:  [12 cmH20-14 cmH20] 12 cmH20 Plateau Pressure:  [25 cmH20-37 cmH20] 37 cmH20   INTAKE / OUTPUT:  Intake/Output Summary (Last 24 hours) at 06/06/15 1113 Last data filed at 06/06/15 1000  Gross per 24 hour  Intake 3304.91 ml  Output  56387 ml  Net -7607.09 ml    PHYSICAL EXAMINATION: General:  Obese male, critically ill Neuro:  Sedated, follows commands on WUA HEENT:  ETT tube in place, moist mucous membranes Cardiovascular:  Regular rate and rhythm  Lungs:  Bilateral rhonchi, some asynchrony. Prolonged  exhale w/ faint wheeze.  Abdomen:  Soft, tender, hypoactive.  Musculoskeletal:   Normal tone, no acute deformities  Skin:  Generalized edema/anasarca   LABS:  CBC  Recent Labs Lab 06/04/15 0310 06/05/15 0500 06/06/15 0345  WBC 22.2* 23.2* 20.8*  HGB 9.9* 8.8* 8.1*  HCT 29.2* 26.0* 23.1*  PLT 179 175 164   Coag's No results for input(s): APTT, INR in the last 168 hours.   BMET  Recent Labs Lab 06/05/15 0500 06/05/15 1645 06/06/15 0345  NA 134* 134* 135  K 4.3 4.2 4.2  CL 97* 99* 100*  CO2 BUN 41* 42* 45*  CREATININE 2.00* 2.10* 2.23*  GLUCOSE 143* 133* 133*   Electrolytes  Recent Labs Lab 06/04/15 0310  06/05/15 0500 06/05/15 1645 06/06/15 0345  CALCIUM 6.7*  6.7*  < > 7.0* 7.6* 7.9*  MG 2.4  --  2.6*  --  2.7*  PHOS 4.2  < > 4.3 3.6 3.3  < > = values in this interval not displayed. Sepsis Markers No results for input(s): LATICACIDVEN, PROCALCITON, O2SATVEN in the last 168 hours. ABG  Recent Labs Lab 06/03/15 0900 06/04/15 1200 06/06/15 1029  PHART 7.386 7.342* 7.444  PCO2ART 41.5 46.9* 35.5  PO2ART 74.0* 120* 68.0*   Liver Enzymes  Recent Labs Lab 06/05/15 0500 06/05/15 1645 06/06/15 0345  ALBUMIN 2.4* 2.7* 2.9*   Cardiac Enzymes No results for input(s): TROPONINI, PROBNP in the last 168 hours. Glucose  Recent Labs Lab 06/05/15 1253 06/05/15 1657 06/05/15 2001 06/05/15 2343 06/06/15 0337 06/06/15 0752  GLUCAP 133* 130* 124* 132* 129* 103*    Imaging Dg Abd Portable 1v  06/05/2015  CLINICAL DATA:  Feeding tube placement EXAM: PORTABLE ABDOMEN - 1 VIEW COMPARISON:  06/05/2015, CT scan abdomen and pelvis. FINDINGS: NG tube with tip in distal stomach again noted. There is a NG feeding tube with tip within duodenum. IMPRESSION: NG tube with tip in distal stomach again noted. There is a NG feeding tube with tip within duodenum. Electronically Signed   By: Natasha Mead M.D.   On: 06/05/2015 15:21   Tests/studies CT  abd/pelvis 12/9: Airspace consolidation in both lung bases. Small left pleural effusion. Widespread anasarca.In comparison with the recent prior study, there is less appendiceal dilatation. There is localized periappendiceal fluid without frank abscess. No free air is seen near the appendix. A small amount of free air is noted in the right lower quadrant immediately adjacent to the ascending colon. It is difficult to ascertain whether this focal area of pneumoperitoneum is secondary to recent trocar placement versus appendiceal or viscus perforation. Moderate ascites. Contrast in gallbladder, likely vicarious excretion secondary to recent hypotensive episodes.Persistent hydronephrosis on the right with 6 mm calculus in the proximal right ureter at the level of L4. Small nonobstructing calculus lower pole left kidney. No bowel obstruction. No demonstrable abscess in the abdomen or pelvis. PCXR w/ persistent right base atx. Otherwise improving.   ASSESSMENT / PLAN: 59 y/o male with multiorgan dysfunction syndrome due to mixed cardiogenic shock from intra-operative MI complicated by septic shock from appendicitis without source control. He was on supra-maximal doses multiple vaso-pressor agents, IABP and maximum ventilator support for has combined metabolic and respiratory acidosis. Over the last 72 hrs he has made some progress. Off Pressors. Have dialed down PC and PEEP as he was pulling VTs in the liter range. Ventilator synchrony seems to be a barrier to transitioning to [facility]. Has mild wheeze and prolonged exhale so will add BD.  Ultimately seems a little better. He still has a right hydro, but this is an issue we can eval further when his shock resolves. Started trickle feeds. Family updated.   PULMONARY A:  Hypoxemic and hypercapnic respiratory failure - despite max vent support. Bibasilar atlelectasis L>R P:   Cont full vent support Peep down to 10/ decreased PC to 14 Repeat PCXR & ABG Cont to  wean FIO2 Not ready for weaning yet  CARDIOVASCULAR L FEM IABP 12/2 >> out  L IJ TLC 12/2 >>  A:  VF Arrest - intraoperatively, s/p LHC with DES to RCA Mixed cardiogenic shock - in the setting of inferior intra-operative MI and septic shock.  IABP now out Torsades / Monomorphic VT, Frequent PVC's - suspect in setting of ischemia  Afib RV Infarct  Theone Murdoch cath removed 12/9 Off pressors on 12/11 P:  Continue heparin gtt and dual anti-platelet therapy. Continue Amiodarone (decreased dose as of 12/11) Milrinone per cards ASA  Stress dose steroids  Cardiology Following   RENAL A:   AGMA/NAGMA: 2/2 oliguric renal failure and lactic acidemia. Hyponatremia  Acute Kidney Injury - in setting of arrest / hypoperfusion  Persistent Right hydronephrosis.  P:   CVVH per renal. Suspect we can start decreasing volume removal goal soon  Monitor UOP / BMP  Replace electrolytes as  indicated  Suspect he will need perc nephrostomy tube on right. Will d/w cards timing.. This will be down the road.   GASTROINTESTINAL A:   Appendicitis - unable to complete surgery for source control.  No evidence of peritonitis or rupture. No abscess formation from f/u CT scan 12/9 P:    PPI  No plan for repeat OR per surgery given his critical condition Started tubefeeds (d/w surgical team)-->cont per protocol   HEMATOLOGIC A:   Dual Antiplatelet therapy and heparin gtt. P:  Aggrenox / Plavix Heparin gtt per pharmacy   INFECTIOUS A:   Septic shock - secondary to appendicitis without source control. P:   BCx2 12/2 >>  ABX: Vanc 12/2 >>  Meropenem 12/3 >> Anidulafungin 12/7 >>  Monitor fever curve / WBC  ENDOCRINE A:   Relative adrenal insufficiency.   P:   Hydrocortisone  IV Q8hours-->will change to q12 now that off pressors  SSI to resistant scale, CBG Q4  NEUROLOGIC A:   ICU Associated Pain: follows commands  P:   RASS goal: -2 with vent compliance Fentanyl gtt for  pain Versed for sedation   FAMILY  - Updates: father updated at bedside.  Continue all current measures.  NO CPR in the event of arrest.  Shock / drugs ok.   CCM time 30 minutes   Simonne Martinet ACNP-BC [facility] Pulmonary/Critical Care Pager # [phone] OR # [phone] if no answer   06/06/2015, 11:13 AM

## 2015-06-07 ENCOUNTER — Inpatient Hospital Stay (HOSPITAL_COMMUNITY): Payer: Medicaid Other

## 2015-06-07 LAB — GLUCOSE, CAPILLARY
GLUCOSE-CAPILLARY: 140 mg/dL — AB (ref 65–99)
GLUCOSE-CAPILLARY: 148 mg/dL — AB (ref 65–99)
Glucose-Capillary: 117 mg/dL — ABNORMAL HIGH (ref 65–99)
Glucose-Capillary: 129 mg/dL — ABNORMAL HIGH (ref 65–99)
Glucose-Capillary: 123 mg/dL — ABNORMAL HIGH (ref 65–99)
Glucose-Capillary: 129 mg/dL — ABNORMAL HIGH (ref 65–99)
Glucose-Capillary: 128 mg/dL — ABNORMAL HIGH (ref 65–99)

## 2015-06-07 LAB — POCT I-STAT 3, ART BLOOD GAS (G3+)
BICARBONATE: 26.9 meq/L — AB (ref 20.0–24.0)
O2 Saturation: 98 %
PCO2 ART: 52 mmHg — AB (ref 35.0–45.0)
TCO2: 28 mmol/L (ref 0–100)
pH, Arterial: 7.322 — ABNORMAL LOW (ref 7.350–7.450)
pO2, Arterial: 124 mmHg — ABNORMAL HIGH (ref 80.0–100.0)

## 2015-06-07 LAB — BLOOD GAS, ARTERIAL
ACID-BASE DEFICIT: 0.6 mmol/L (ref 0.0–2.0)
ACID-BASE DEFICIT: 1.6 mmol/L (ref 0.0–2.0)
Bicarbonate: 24.1 mEq/L — ABNORMAL HIGH (ref 20.0–24.0)
Bicarbonate: 23.9 mEq/L (ref 20.0–24.0)
DRAWN BY: 42624
DRAWN BY: 419771
FIO2: 0.6
FIO2: 0.8
LHR: 14 {breaths}/min
O2 SAT: 89.7 %
O2 SAT: 97.8 %
PATIENT TEMPERATURE: 98.6
PCO2 ART: 44 mmHg (ref 35.0–45.0)
PCO2 ART: 50.2 mmHg — AB (ref 35.0–45.0)
PEEP/CPAP: 10 cmH2O
PEEP: 10 cmH2O
PH ART: 7.299 — AB (ref 7.350–7.450)
PO2 ART: 64 mmHg — AB (ref 80.0–100.0)
Patient temperature: 98.6
Pressure control: 30 cmH2O
RATE: 15 resp/min
TCO2: 25.5 mmol/L (ref 0–100)
TCO2: 25.4 mmol/L (ref 0–100)
pH, Arterial: 7.358 (ref 7.350–7.450)
pO2, Arterial: 119 mmHg — ABNORMAL HIGH (ref 80.0–100.0)

## 2015-06-07 LAB — CBC
HCT: 24.8 % — ABNORMAL LOW (ref 39.0–52.0)
HEMOGLOBIN: 8.5 g/dL — AB (ref 13.0–17.0)
MCH: 28.2 pg (ref 26.0–34.0)
MCHC: 34.3 g/dL (ref 30.0–36.0)
MCV: 82.4 fL (ref 78.0–100.0)
Platelets: 171 10*3/uL (ref 150–400)
RBC: 3.01 MIL/uL — ABNORMAL LOW (ref 4.22–5.81)
RDW: 14.9 % (ref 11.5–15.5)
WBC: 25.3 10*3/uL — ABNORMAL HIGH (ref 4.0–10.5)

## 2015-06-07 LAB — RENAL FUNCTION PANEL
ANION GAP: 12 (ref 5–15)
ANION GAP: 9 (ref 5–15)
Albumin: 2.7 g/dL — ABNORMAL LOW (ref 3.5–5.0)
Albumin: 2.6 g/dL — ABNORMAL LOW (ref 3.5–5.0)
BUN: 54 mg/dL — AB (ref 6–20)
BUN: 54 mg/dL — ABNORMAL HIGH (ref 6–20)
CALCIUM: 8.1 mg/dL — AB (ref 8.9–10.3)
CO2: 24 mmol/L (ref 22–32)
CO2: 25 mmol/L (ref 22–32)
Calcium: 8.2 mg/dL — ABNORMAL LOW (ref 8.9–10.3)
Chloride: 100 mmol/L — ABNORMAL LOW (ref 101–111)
Chloride: 102 mmol/L (ref 101–111)
Creatinine, Ser: 2.41 mg/dL — ABNORMAL HIGH (ref 0.61–1.24)
Creatinine, Ser: 2.32 mg/dL — ABNORMAL HIGH (ref 0.61–1.24)
GFR calc Af Amer: 32 mL/min — ABNORMAL LOW (ref >60)
GFR calc Af Amer: 34 mL/min — ABNORMAL LOW (ref >60)
GFR calc non Af Amer: 28 mL/min — ABNORMAL LOW (ref >60)
GFR calc non Af Amer: 29 mL/min — ABNORMAL LOW (ref >60)
GLUCOSE: 138 mg/dL — AB (ref 65–99)
GLUCOSE: 145 mg/dL — AB (ref 65–99)
POTASSIUM: 4.5 mmol/L (ref 3.5–5.1)
Phosphorus: 3.7 mg/dL (ref 2.5–4.6)
Phosphorus: 4.4 mg/dL (ref 2.5–4.6)
Potassium: 4 mmol/L (ref 3.5–5.1)
SODIUM: 136 mmol/L (ref 135–145)
Sodium: 136 mmol/L (ref 135–145)

## 2015-06-07 LAB — CARBOXYHEMOGLOBIN
Carboxyhemoglobin: 0.7 % (ref 0.5–1.5)
METHEMOGLOBIN: 1.1 % (ref 0.0–1.5)
O2 Saturation: 67.2 %
Total hemoglobin: 9.8 g/dL — ABNORMAL LOW (ref 13.5–18.0)

## 2015-06-07 LAB — HEPARIN LEVEL (UNFRACTIONATED): HEPARIN UNFRACTIONATED: 0.43 [IU]/mL (ref 0.30–0.70)

## 2015-06-07 LAB — MAGNESIUM: Magnesium: 2.8 mg/dL — ABNORMAL HIGH (ref 1.7–2.4)

## 2015-06-07 NOTE — Progress Notes (Signed)
PULMONARY / CRITICAL CARE MEDICINE   Name: Scott Holden MRN: [id number] DOB: [birth year]    ADMISSION DATE:  June 23, 2015 CONSULTATION DATE:  06/23/2015   REFERRING MD :  Dr. Clifton James  CHIEF COMPLAINT:  Cardiogenic and septic shock  INITIAL PRESENTATION:  59M presented to [facility] with several days of progressively worsening RLQ pain found to have an acute appendicitis.  He went to the OR and suffered a VT arrest on the table after the insertion of the trochar.  He was transferred to [facility] after a code STEMI was called and the surgical intervention was aborted.  In the cath lab an RCA DES was placed and an IABP inserted for MI and cardiogenic shock.  While in cath lab, he required shock x6 for VF arrest and post return to ICU, shock x5.    SUBJECTIVE:  Sedated on vent; very asynchronous with the vent this AM.  VITAL SIGNS: Temp:  [97.7 F (36.5 C)-98.2 F (36.8 C)] 97.9 F (36.6 C) (12/12 0800) Pulse Rate:  [77-94] 77 (12/12 0735) Resp:  [10-25] 14 (12/12 0900) BP: (90-106)/(47-62) 92/55 mmHg (12/12 0900) SpO2:  [90 %-100 %] 97 % (12/12 0900) Arterial Line BP: (74-145)/(41-64) 101/45 mmHg (12/12 0900) FiO2 (%):  [60 %-80 %] 80 % (12/12 0735) Weight:  [126.4 kg (278 lb 10.6 oz)] 126.4 kg (278 lb 10.6 oz) (12/12 0322)   HEMODYNAMICS: CVP:  [8 mmHg-13 mmHg] 9 mmHg   VENTILATOR SETTINGS: Vent Mode:  [-] PCV FiO2 (%):  [60 %-80 %] 80 % Set Rate:  [14 bmp-26 bmp] 14 bmp PEEP:  [10 cmH20] 10 cmH20 Plateau Pressure:  [30 cmH20-35 cmH20] 32 cmH20   INTAKE / OUTPUT:  Intake/Output Summary (Last 24 hours) at 06/07/15 1118 Last data filed at 06/07/15 1000  Gross per 24 hour  Intake 3760.3 ml  Output   9022 ml  Net -5261.7 ml    PHYSICAL EXAMINATION: General:  Obese male, critically ill Neuro:  Sedated, follows commands on WUA HEENT:  ETT tube in place, moist mucous membranes Cardiovascular:  Regular rate and rhythm  Lungs:  Bilateral rhonchi, some asynchrony. Prolonged exhale w/  faint wheeze.  Abdomen:  Soft, tender, hypoactive.  Musculoskeletal:   Normal tone, no acute deformities  Skin:  Generalized edema/anasarca   LABS:  CBC  Recent Labs Lab 06/05/15 0500 06/06/15 0345 06/07/15 0345  WBC 23.2* 20.8* 25.3*  HGB 8.8* 8.1* 8.5*  HCT 26.0* 23.1* 24.8*  PLT 175 164 171   Coag's No results for input(s): APTT, INR in the last 168 hours.   BMET  Recent Labs Lab 06/06/15 0345 06/06/15 1615 06/07/15 0345  NA 135 135 136  K 4.2 4.2 4.0  CL 100* 100* 100*  CO2 BUN 45* 48* 54*  CREATININE 2.23* 2.33* 2.41*  GLUCOSE 133* 135* 138*   Electrolytes  Recent Labs Lab 06/05/15 0500  06/06/15 0345 06/06/15 1615 06/07/15 0345  CALCIUM 7.0*  < > 7.9* 8.1* 8.1*  MG 2.6*  --  2.7*  --  2.8*  PHOS 4.3  < > 3.3 3.3 3.7  < > = values in this interval not displayed. Sepsis Markers No results for input(s): LATICACIDVEN, PROCALCITON, O2SATVEN in the last 168 hours. ABG  Recent Labs Lab 06/06/15 1212 06/07/15 0340 06/07/15 0830  PHART 7.417 7.358 7.279*  PCO2ART 37.6 44.0 52.1*  PO2ART 67.0* 64.0* 78.5*   Liver Enzymes  Recent Labs Lab 06/06/15 0345 06/06/15 1615 06/07/15 0345  ALBUMIN 2.9* 2.7* 2.7*  Cardiac Enzymes No results for input(s): TROPONINI, PROBNP in the last 168 hours. Glucose  Recent Labs Lab 06/06/15 0337 06/06/15 0752 06/06/15 1216 06/06/15 1604 06/06/15 2343 06/07/15 0337  GLUCAP 129* 103* 113* 126* 130* 129*    Imaging Dg Chest Port 1 View  06/07/2015  CLINICAL DATA:  Check endotracheal tube placement EXAM: PORTABLE CHEST - 1 VIEW COMPARISON:  06/07/2015 FINDINGS: Cardiac shadow is mildly enlarged but stable. A nasogastric catheter is noted extending into the stomach. A feeding catheter extends into the stomach as well. An endotracheal tube is seen 3 cm above the carina. A left jugular central line and right jugular sheath are again seen and stable. Bibasilar changes are noted right greater than left  but stable in appearance. No new focal abnormality is seen. IMPRESSION: Tubes and lines as described. Bibasilar changes right greater than left. This is stable from the prior exam. Electronically Signed   By: Alcide Clever M.D.   On: 06/07/2015 07:59   Dg Chest Port 1 View  06/07/2015  CLINICAL DATA:  Acute respiratory failure. EXAM: PORTABLE CHEST 1 VIEW COMPARISON:  06/06/2015. FINDINGS: Endotracheal tube and NG tube in stable position. Right IJ sheath in stable position. Left IJ line in stable position. Persistent low lung volumes with bibasilar atelectasis and/or infiltrates. Cardiomegaly with interim increase in pulmonary vascularity and interstitial markings consistent with mild congestive heart failure. IMPRESSION: 1. Lines and tubes in stable position. 2. Persistent low lung volumes with bibasilar atelectasis and/or infiltrates. 3. Cardiomegaly with new onset mild pulmonary vascular prominence and bilateral interstitial prominence consistent with congestive heart failure. Electronically Signed   By: Maisie Fus  Register   On: 06/07/2015 07:15   Dg Chest Port 1 View  06/06/2015  CLINICAL DATA:  Acute respiratory failure, in tube EXAM: PORTABLE CHEST 1 VIEW COMPARISON:  06/01/2015 FINDINGS: Stable endotracheal and NG tube position. No pulmonary edema. To S1 grounds catheter has been removed. Balloon pump is unchanged in position. There is right IJ sheath in place. Small right pleural effusion with right basilar atelectasis or infiltrate. IMPRESSION: Stable support apparatus. Right Swan-Ganz catheter has been removed. No pulmonary edema. There is small right pleural effusion with right basilar atelectasis or infiltrate. Electronically Signed   By: Natasha Mead M.D.   On: 06/06/2015 14:01   Tests/studies CT abd/pelvis 12/9: Airspace consolidation in both lung bases. Small left pleural effusion. Widespread anasarca.In comparison with the recent prior study, there is less appendiceal dilatation. There is  localized periappendiceal fluid without frank abscess. No free air is seen near the appendix. A small amount of free air is noted in the right lower quadrant immediately adjacent to the ascending colon. It is difficult to ascertain whether this focal area of pneumoperitoneum is secondary to recent trocar placement versus appendiceal or viscus perforation. Moderate ascites. Contrast in gallbladder, likely vicarious excretion secondary to recent hypotensive episodes.Persistent hydronephrosis on the right with 6 mm calculus in the proximal right ureter at the level of L4. Small nonobstructing calculus lower pole left kidney. No bowel obstruction. No demonstrable abscess in the abdomen or pelvis. PCXR that I reviewed myself w/ persistent right base atx. Otherwise improving.   ASSESSMENT / PLAN: 59 y/o male with multiorgan dysfunction syndrome due to mixed cardiogenic shock from intra-operative MI complicated by septic shock from appendicitis without source control. He was on supra-maximal doses multiple vaso-pressor agents, IABP and maximum ventilator support for has combined metabolic and respiratory acidosis. Over the last 72 hrs he has made some progress. Off  Pressors. Have dialed down PC and PEEP as he was pulling VTs in the liter range. Ventilator synchrony seems to be a barrier to transitioning to [place]. Has mild wheeze and prolonged exhale so will add BD.  Ultimately seems a little better. He still has a right hydro, but this is an issue we can eval further when his shock resolves. Started trickle feeds. Family updated.   PULMONARY A:  Hypoxemic and hypercapnic respiratory failure - despite max vent support. Bibasilar atlelectasis L>R Severely asynchronous with the ventilator. P:   - Change to PCV rate of 15, P high of 40, P low of 10, FiO2 of 100% and I time of 1.5. - F/U ABG. - Repeat PCXR & ABG in AM. - Cont to wean FiO2. - Not ready for weaning yet.  CARDIOVASCULAR L FEM IABP 12/2 >> out   L IJ TLC 12/2 >>  A:  VF Arrest - intraoperatively, s/p LHC with DES to RCA Mixed cardiogenic shock - in the setting of inferior intra-operative MI and septic shock.  IABP now out Torsades / Monomorphic VT, Frequent PVC's - suspect in setting of ischemia  Afib RV Infarct  Theone Murdoch cath removed 12/9 Off pressors on 12/11 P:  - Continue heparin gtt and dual anti-platelet therapy. - Continue Amiodarone (decreased dose as of 12/11) - Milrinone per cards - ASA  - Stress dose steroids  - Cardiology Following   RENAL A:   AGMA/NAGMA: 2/2 oliguric renal failure and lactic acidemia. Hyponatremia  Acute Kidney Injury - in setting of arrest / hypoperfusion  Persistent Right hydronephrosis.  P:   - CVVH per renal. Positive balance given hemodynamics. - Monitor UOP / BMP  - Replace electrolytes as indicated  - Suspect he will need perc nephrostomy tube on right. Will d/w cards timing.. This will be down the road.   GASTROINTESTINAL A:   Appendicitis - unable to complete surgery for source control.  No evidence of peritonitis or rupture. No abscess formation from f/u CT scan 12/9 P:   - PPI  - No plan for repeat OR per surgery given his critical condition - Tubefeeds (d/w surgical team)-->cont per protocol   HEMATOLOGIC A:   Dual Antiplatelet therapy and heparin gtt. P:  - Aggrenox / Plavix - Heparin gtt per pharmacy   INFECTIOUS A:   Septic shock - secondary to appendicitis without source control. P:   BCx2 12/2 >>  ABX: - Vanc 12/2 >>  - Meropenem 12/3 >> - Anidulafungin 12/7 >>  Monitor fever curve / WBC  ENDOCRINE A:   Relative adrenal insufficiency.   P:   - Hydrocortisone 100mg  IV Q8hours-->will change to q12 now that off pressors  - SSI to resistant scale, CBG Q4  NEUROLOGIC A:   ICU Associated Pain: follows commands  P:   - RASS goal: -2 with vent compliance - Fentanyl gtt for pain - Versed for sedation   FAMILY  - Updates: Father and sister  updated at length bedside.  The patient is critically ill with multiple organ systems failure and requires high complexity decision making for assessment and support, frequent evaluation and titration of therapies, application of advanced monitoring technologies and extensive interpretation of multiple databases.   Critical Care Time devoted to patient care services described in this note is  38  Minutes. This time reflects time of care of this signee Dr Koren Bound. This critical care time does not reflect procedure time, or teaching time or supervisory time of PA/NP/Med student/Med  Resident etc but could involve care discussion time.  Alyson Reedy, M.D. [facility] Pulmonary/Critical Care Medicine. Pager: [phone]. After hours pager: [phone].  06/07/2015, 11:18 AM

## 2015-06-07 NOTE — Progress Notes (Signed)
eLink Physician-Brief Progress Note Patient Name: Scott Holden DOB: 1955-07-06 MRN: [id number]   Date of Service  06/07/2015  HPI/Events of Note  Now needing levophed. No obvious bleed per RN  eICU Interventions  bp goal - sbp > 95 + map > 55 Await AM labs     Intervention Category Intermediate Interventions: Hypotension - evaluation and management  [name] 06/07/2015, 4:35 AM

## 2015-06-07 NOTE — Progress Notes (Signed)
Patient ID: Scott Holden, male   DOB: 1956-06-02, 59 y.o.   MRN: [id number] S:agitated, intubated O:BP 92/50 mmHg  Pulse 77  Temp(Src) 97.9 F (36.6 C) (Oral)  Resp 10  Ht 5\' 10"  (1.778 m)  Wt 126.4 kg (278 lb 10.6 oz)  BMI 39.98 kg/m2  SpO2 93%  Intake/Output Summary (Last 24 hours) at 06/07/15 0837 Last data filed at 06/07/15 0800  Gross per 24 hour  Intake 3927.1 ml  Output  01093 ml  Net -6589.9 ml   Intake/Output: I/O last 3 completed shifts: In: 6032.4 [I.V.:2912.4; Other:10; NG/GT:1510; IV Piggyback:1600] Out: 23557 [Urine:65; Emesis/NG output:1900; Other:14659]  Intake/Output this shift:  Total I/O In: 253.8 [I.V.:83.8; NG/GT:70; IV Piggyback:100] Out: 19 [Other:19] Weight change: -7.7 kg (-16 lb 15.6 oz) Gen:WD obese WM intubated and agitated CVS:no rub Resp:scattered rhonchi Abd: soft, hypoactive BS Ext:1+ edema   Recent Labs Lab 06/04/15 0310 06/04/15 1600 06/05/15 0500 06/05/15 1645 06/06/15 0345 06/06/15 1615 06/07/15 0345  NA 130*  131* 133* 134* 134* 135 135 136  K 4.5  4.4 4.6 4.3 4.2 4.2 4.2 4.0  CL 97*  96* 97* 97* 99* 100* 100* 100*  CO2 29  29 27 24 24 25 24 24   GLUCOSE 149*  150* 132* 143* 133* 133* 135* 138*  BUN 38*  37* 37* 41* 42* 45* 48* 54*  CREATININE 1.81*  1.80* 1.85* 2.00* 2.10* 2.23* 2.33* 2.41*  ALBUMIN 1.7* 2.1* 2.4* 2.7* 2.9* 2.7* 2.7*  CALCIUM 6.7*  6.7* 7.0* 7.0* 7.6* 7.9* 8.1* 8.1*  PHOS 4.2 4.1 4.3 3.6 3.3 3.3 3.7   Liver Function Tests:  Recent Labs Lab 06/06/15 0345 06/06/15 1615 06/07/15 0345  ALBUMIN 2.9* 2.7* 2.7*   No results for input(s): LIPASE, AMYLASE in the last 168 hours. No results for input(s): AMMONIA in the last 168 hours. CBC:  Recent Labs Lab 06/03/15 0805 06/04/15 0310 06/05/15 0500 06/06/15 0345 06/07/15 0345  WBC 25.1* 22.2* 23.2* 20.8* 25.3*  NEUTROABS 22.5*  --   --   --   --   HGB 10.5* 9.9* 8.8* 8.1* 8.5*  HCT 30.8* 29.2* 26.0* 23.1* 24.8*  MCV 84.8 87.2 85.8 84.6 82.4   PLT 278 179 175 164 171   Cardiac Enzymes: No results for input(s): CKTOTAL, CKMB, CKMBINDEX, TROPONINI in the last 168 hours. CBG:  Recent Labs Lab 06/06/15 0752 06/06/15 1216 06/06/15 1604 06/06/15 2343 06/07/15 0337  GLUCAP 103* 113* 126* 130* 129*    Iron Studies: No results for input(s): IRON, TIBC, TRANSFERRIN, FERRITIN in the last 72 hours. Studies/Results: Dg Chest Port 1 View  06/07/2015  CLINICAL DATA:  Check endotracheal tube placement EXAM: PORTABLE CHEST - 1 VIEW COMPARISON:  06/07/2015 FINDINGS: Cardiac shadow is mildly enlarged but stable. A nasogastric catheter is noted extending into the stomach. A feeding catheter extends into the stomach as well. An endotracheal tube is seen 3 cm above the carina. A left jugular central line and right jugular sheath are again seen and stable. Bibasilar changes are noted right greater than left but stable in appearance. No new focal abnormality is seen. IMPRESSION: Tubes and lines as described. Bibasilar changes right greater than left. This is stable from the prior exam. Electronically Signed   By: Alcide Clever M.D.   On: 06/07/2015 07:59   Dg Chest Port 1 View  06/07/2015  CLINICAL DATA:  Acute respiratory failure. EXAM: PORTABLE CHEST 1 VIEW COMPARISON:  06/06/2015. FINDINGS: Endotracheal tube and NG tube in stable position. Right  IJ sheath in stable position. Left IJ line in stable position. Persistent low lung volumes with bibasilar atelectasis and/or infiltrates. Cardiomegaly with interim increase in pulmonary vascularity and interstitial markings consistent with mild congestive heart failure. IMPRESSION: 1. Lines and tubes in stable position. 2. Persistent low lung volumes with bibasilar atelectasis and/or infiltrates. 3. Cardiomegaly with new onset mild pulmonary vascular prominence and bilateral interstitial prominence consistent with congestive heart failure. Electronically Signed   By: Maisie Fus  Register   On: 06/07/2015 07:15    Dg Chest Port 1 View  06/06/2015  CLINICAL DATA:  Acute respiratory failure, in tube EXAM: PORTABLE CHEST 1 VIEW COMPARISON:  06/01/2015 FINDINGS: Stable endotracheal and NG tube position. No pulmonary edema. To S1 grounds catheter has been removed. Balloon pump is unchanged in position. There is right IJ sheath in place. Small right pleural effusion with right basilar atelectasis or infiltrate. IMPRESSION: Stable support apparatus. Right Swan-Ganz catheter has been removed. No pulmonary edema. There is small right pleural effusion with right basilar atelectasis or infiltrate. Electronically Signed   By: Natasha Mead M.D.   On: 06/06/2015 14:01   Dg Abd Portable 1v  06/05/2015  CLINICAL DATA:  Feeding tube placement EXAM: PORTABLE ABDOMEN - 1 VIEW COMPARISON:  06/05/2015, CT scan abdomen and pelvis. FINDINGS: NG tube with tip in distal stomach again noted. There is a NG feeding tube with tip within duodenum. IMPRESSION: NG tube with tip in distal stomach again noted. There is a NG feeding tube with tip within duodenum. Electronically Signed   By: Natasha Mead M.D.   On: 06/05/2015 15:21   . anidulafungin  100 mg Intravenous Q24H  . antiseptic oral rinse  7 mL Mouth Rinse 10 times per day  . arformoterol  15 mcg Nebulization BID  . aspirin  81 mg Per Tube Daily  . atorvastatin  80 mg Per NG tube q1800  . budesonide (PULMICORT) nebulizer solution  0.5 mg Nebulization BID  . chlorhexidine gluconate  15 mL Mouth Rinse BID  . Chlorhexidine Gluconate Cloth  6 each Topical Q0600  . clopidogrel  75 mg Per NG tube Daily  . fentaNYL (SUBLIMAZE) injection  50 mcg Intravenous Once  . hydrocortisone sod succinate (SOLU-CORTEF) inj  100 mg Intravenous Q12H  . Influenza vac split quadrivalent PF  0.5 mL Intramuscular Tomorrow-1000  . insulin aspart  0-20 Units Subcutaneous 6 times per day  . meropenem (MERREM) IV  1 g Intravenous 3 times per day  . pantoprazole sodium  40 mg Per Tube Daily  . sodium chloride   10-40 mL Intracatheter Q12H  . vancomycin  1,500 mg Intravenous Q24H    BMET    Component Value Date/Time   NA 136 06/07/2015 0345   NA 140 01/15/2014 0910   K 4.0 06/07/2015 0345   CL 100* 06/07/2015 0345   CO2 24 06/07/2015 0345   GLUCOSE 138* 06/07/2015 0345   GLUCOSE 121* 01/15/2014 0910   BUN 54* 06/07/2015 0345   BUN 9 01/15/2014 0910   CREATININE 2.41* 06/07/2015 0345   CALCIUM 8.1* 06/07/2015 0345   GFRNONAA 28* 06/07/2015 0345   GFRAA 32* 06/07/2015 0345   CBC    Component Value Date/Time   WBC 25.3* 06/07/2015 0345   WBC 11.3* 01/15/2014 0910   RBC 3.01* 06/07/2015 0345   RBC 4.86 01/15/2014 0910   HGB 8.5* 06/07/2015 0345   HCT 24.8* 06/07/2015 0345   PLT 171 06/07/2015 0345   MCV 82.4 06/07/2015 0345   MCH  28.2 06/07/2015 0345   MCH 29.6 01/15/2014 0910   MCHC 34.3 06/07/2015 0345   MCHC 33.3 01/15/2014 0910   RDW 14.9 06/07/2015 0345   RDW 15.2 01/15/2014 0910   LYMPHSABS 1.3 06/03/2015 0805   LYMPHSABS 3.2* 01/15/2014 0910   MONOABS 1.3* 06/03/2015 0805   EOSABS 0.0 06/03/2015 0805   EOSABS 0.3 01/15/2014 0910   BASOSABS 0.0 06/03/2015 0805   BASOSABS 0.0 01/15/2014 0910     Assessment/Plan: 1. ARF, non-oliguric in setting of cardiogenic shock/sepsis and IV contrast as well as bilateral hydronephrosis and obstructing stone in right ureter. He is requiring pressors and despite being non-oliguric he is unable to keep up with the volume administered due to pressors/meds and was markedly volume overloaded. Discussed case with Dr. Gala Romney and CVVHD initiated on 06/01/15 due to hypoxemia and pulmonary edema. 1. Tolerating CVVHD with UF and will continue for now. 2. Appreciate Dr. Prescott Gum assistance with dialysis access 2. Acute inferior STEMI with RV involvement- s/p PTCA and DES placement in RCA 12/2 complicated by cardiogenic shock requiring an IABP and pressors. IABP removed 06/03/15 and taper pressors as able. 3. Acute appendicitis with  gangrenous changes but unable to remove due to his cardiac arrest and critical illness. Continue with antibiotics and pressors/supportive care 4. VDRF with increased oxygen requirements and marked volume overload. After initiation of CVVHDF able to wean FIO2 somewhat but is now stacking breaths.  Await PCCM input as he may require paralysis to help ventilate. 5. SIRS/multi-organ failure- cardiogenic and sepsis. On pressors, antibiotics, and IABP 6. Acute systolic heart failure with cardiogenic shock and biventricular failure. Inadequate response from lasix gtt and metolazone/diuril. Plan for CVVHDF as above 7. Recurrent VT- per Cardiology 8. Hypocalcemia- will treat with CVVHD and follow 9. HEME- on heparin gtt as well as angiomax 10. Nephrolithiasis with obstructive uropathy- too unstable for any procedure at this time. Continue with supportive measures 11. Disposition- overall prognosis is grim due to multiple issues which cannot be addressed due to his critical condition. Patient's pastor is with his family, however his sister would like to continue with aggressive therapy for now. This may need to be addressed if his condition continues to deteriorate.  Friend Dorfman A

## 2015-06-07 NOTE — Progress Notes (Signed)
eLink Physician-Brief Progress Note Patient Name: Scott Holden DOB: [birth year] MRN: [id number]   Date of Service  06/07/2015  HPI/Events of Note  Hypotension - Norepinephrine requirement now increased to 20 mcg/min. CVP = 10.  eICU Interventions  Will order:  1. Restart Vasopressin IV infusion. 2. ABG now.      Intervention Category Major Interventions: Hypotension - evaluation and management  Lenell Antu 06/07/2015, 8:57 PM

## 2015-06-07 NOTE — Progress Notes (Signed)
Patient ID: Scott Holden, male   DOB: November 30, 1955, 59 y.o.   MRN: [id number]    Advanced Heart Failure Rounding Note   Subjective:    Remains intubated/sedated on CVVHD.  Wakes up with weaning of sedation.  Not pullling much much due to hypotension.  Down 13 lbs again yesterday => CVP remains 9 this morning.    Over night he has episodes of hypotension. Levo increased to 6 mcg. On amio 30 mg per hour and milrinone 0.375 mcg. WBC going up. Increased NG output .--->1.5 liters. Respiratory status worse. CCM adjusting vent.   Anuric.     Objective:   Weight Range:  Vital Signs:   Temp:  [97.7 F (36.5 C)-98.2 F (36.8 C)] 97.7 F (36.5 C) (12/12 0400) Pulse Rate:  [92-94] 94 (12/11 1717) Resp:  [13-26] 19 (12/12 0700) BP: (90-118)/(47-72) 95/52 mmHg (12/12 0400) SpO2:  [90 %-100 %] 92 % (12/12 0700) Arterial Line BP: (74-154)/(41-64) 74/41 mmHg (12/12 0700) FiO2 (%):  [60 %] 60 % (12/12 0400) Weight:  [278 lb 10.6 oz (126.4 kg)] 278 lb 10.6 oz (126.4 kg) (12/12 0322) Last BM Date: 05/27/15  Weight change: Filed Weights   06/05/15 0447 06/06/15 0500 06/07/15 0322  Weight: 313 lb 15 oz (142.4 kg) 295 lb 10.2 oz (134.1 kg) 278 lb 10.6 oz (126.4 kg)    Intake/Output:   Intake/Output Summary (Last 24 hours) at 06/07/15 0720 Last data filed at 06/07/15 0700  Gross per 24 hour  Intake 3964.4 ml  Output  24462 ml  Net -7194.6 ml     Physical Exam: CVP 9  General:  Intubated sedated.  HEENT: normal x for ETT Neck: supple. JVP difficult (thick neck). Cor: PMI nonpalpable. Regular rate & rhythm. No rubs, gallops or murmurs. Lungs: clear anteriorly with increased WOB Abdomen: Markedly obese. Soft. Hypoactive BS Extremities: Mild edema. HD catheter in R groin.  Neuro: sedated non focal  Telemetry: NSR 70s PVCs.   Labs: Basic Metabolic Panel:  Recent Labs Lab 06/03/15 0416  06/04/15 0310  06/05/15 0500 06/05/15 1645 06/06/15 0345 06/06/15 1615 06/07/15 0345  NA  129*  < > 130*  131*  < > 134* 134* 135 135 136  K 4.1  < > 4.5  4.4  < > 4.3 4.2 4.2 4.2 4.0  CL 90*  < > 97*  96*  < > 97* 99* 100* 100* 100*  CO2 29  < > 29  29  < > 24 24 25 24 24   GLUCOSE 150*  < > 149*  150*  < > 143* 133* 133* 135* 138*  BUN 38*  < > 38*  37*  < > 41* 42* 45* 48* 54*  CREATININE 1.50*  < > 1.81*  1.80*  < > 2.00* 2.10* 2.23* 2.33* 2.41*  CALCIUM 6.5*  < > 6.7*  6.7*  < > 7.0* 7.6* 7.9* 8.1* 8.1*  MG 2.3  --  2.4  --  2.6*  --  2.7*  --  2.8*  PHOS 4.4  < > 4.2  < > 4.3 3.6 3.3 3.3 3.7  < > = values in this interval not displayed.  Liver Function Tests:  Recent Labs Lab 06/05/15 0500 06/05/15 1645 06/06/15 0345 06/06/15 1615 06/07/15 0345  ALBUMIN 2.4* 2.7* 2.9* 2.7* 2.7*   No results for input(s): LIPASE, AMYLASE in the last 168 hours. No results for input(s): AMMONIA in the last 168 hours.  CBC:  Recent Labs Lab 06/03/15 0805 06/04/15 0310  06/05/15 0500 06/06/15 0345 06/07/15 0345  WBC 25.1* 22.2* 23.2* 20.8* 25.3*  NEUTROABS 22.5*  --   --   --   --   HGB 10.5* 9.9* 8.8* 8.1* 8.5*  HCT 30.8* 29.2* 26.0* 23.1* 24.8*  MCV 84.8 87.2 85.8 84.6 82.4  PLT 278 179 175 164 171    Cardiac Enzymes: No results for input(s): CKTOTAL, CKMB, CKMBINDEX, TROPONINI in the last 168 hours.  BNP: BNP (last 3 results) No results for input(s): BNP in the last 8760 hours.  ProBNP (last 3 results) No results for input(s): PROBNP in the last 8760 hours.    Other results:  Imaging: Dg Chest Port 1 View  06/07/2015  CLINICAL DATA:  Acute respiratory failure. EXAM: PORTABLE CHEST 1 VIEW COMPARISON:  06/06/2015. FINDINGS: Endotracheal tube and NG tube in stable position. Right IJ sheath in stable position. Left IJ line in stable position. Persistent low lung volumes with bibasilar atelectasis and/or infiltrates. Cardiomegaly with interim increase in pulmonary vascularity and interstitial markings consistent with mild congestive heart failure.  IMPRESSION: 1. Lines and tubes in stable position. 2. Persistent low lung volumes with bibasilar atelectasis and/or infiltrates. 3. Cardiomegaly with new onset mild pulmonary vascular prominence and bilateral interstitial prominence consistent with congestive heart failure. Electronically Signed   By: Maisie Fus  Register   On: 06/07/2015 07:15   Dg Chest Port 1 View  06/06/2015  CLINICAL DATA:  Acute respiratory failure, in tube EXAM: PORTABLE CHEST 1 VIEW COMPARISON:  06/01/2015 FINDINGS: Stable endotracheal and NG tube position. No pulmonary edema. To S1 grounds catheter has been removed. Balloon pump is unchanged in position. There is right IJ sheath in place. Small right pleural effusion with right basilar atelectasis or infiltrate. IMPRESSION: Stable support apparatus. Right Swan-Ganz catheter has been removed. No pulmonary edema. There is small right pleural effusion with right basilar atelectasis or infiltrate. Electronically Signed   By: Natasha Mead M.D.   On: 06/06/2015 14:01   Dg Abd Portable 1v  06/05/2015  CLINICAL DATA:  Feeding tube placement EXAM: PORTABLE ABDOMEN - 1 VIEW COMPARISON:  06/05/2015, CT scan abdomen and pelvis. FINDINGS: NG tube with tip in distal stomach again noted. There is a NG feeding tube with tip within duodenum. IMPRESSION: NG tube with tip in distal stomach again noted. There is a NG feeding tube with tip within duodenum. Electronically Signed   By: Natasha Mead M.D.   On: 06/05/2015 15:21     Medications:     Scheduled Medications: . anidulafungin  100 mg Intravenous Q24H  . antiseptic oral rinse  7 mL Mouth Rinse 10 times per day  . arformoterol  15 mcg Nebulization BID  . aspirin  81 mg Per Tube Daily  . atorvastatin  80 mg Per NG tube q1800  . budesonide (PULMICORT) nebulizer solution  0.5 mg Nebulization BID  . chlorhexidine gluconate  15 mL Mouth Rinse BID  . Chlorhexidine Gluconate Cloth  6 each Topical Q0600  . clopidogrel  75 mg Per NG tube Daily  .  feeding supplement (VITAL HIGH PROTEIN)  1,000 mL Per Tube Q24H  . fentaNYL (SUBLIMAZE) injection  50 mcg Intravenous Once  . hydrocortisone sod succinate (SOLU-CORTEF) inj  100 mg Intravenous Q12H  . Influenza vac split quadrivalent PF  0.5 mL Intramuscular Tomorrow-1000  . insulin aspart  0-20 Units Subcutaneous 6 times per day  . meropenem (MERREM) IV  1 g Intravenous 3 times per day  . pantoprazole sodium  40 mg Per Tube  Daily  . sodium chloride  10-40 mL Intracatheter Q12H  . vancomycin  1,500 mg Intravenous Q24H    Infusions: . sodium chloride 0 mL/hr at 06/03/15 1600  . sodium chloride Stopped (06/04/15 1200)  . amiodarone 30 mg/hr (06/07/15 0718)  . feeding supplement (VITAL HIGH PROTEIN) 1,000 mL (06/07/15 0400)  . fentaNYL infusion INTRAVENOUS 200 mcg/hr (06/06/15 2200)  . heparin 950 Units/hr (06/06/15 2210)  . midazolam (VERSED) infusion 4 mg/hr (06/07/15 0247)  . milrinone 0.375 mcg/kg/min (06/07/15 0400)  . norepinephrine (LEVOPHED) Adult infusion 6 mcg/min (06/07/15 0718)  . dialysis replacement fluid (prismasate) 500 mL/hr at 06/06/15 2147  . dialysis replacement fluid (prismasate) 300 mL/hr at 06/06/15 1007  . dialysate (PRISMASATE) 1,000 mL/hr at 06/07/15 0246  . vasopressin (PITRESSIN) infusion - *FOR SHOCK* 0.03 Units/min (06/05/15 1412)    PRN Medications: sodium chloride, fentaNYL, heparin, heparin, midazolam, midazolam, morphine injection, ondansetron **OR** ondansetron (ZOFRAN) IV, simethicone, sodium chloride   Assessment:   1. Acute systolic HF with cardiogenic shock with biventricular dysfunction R>L.  EF 20-25%.  2. Acute inferior STEMI with RV involvement   --s/p PCI RCA on 12/2 3. Acute respiratory failure 4. AKI now on CVVHD 5. Acute appendicitis being treated medically 6. Recurrent VT/VF. Torsades after Swan pulled 12/9 7. New onset AF - back in NSR on amiodarone gtt 8. Cardiogenic/septic shock  Plan/Discussion:    CVP 9.. No pulling on  CVVHD due to hypotension. CO-OX 67% .  Remains oliguric.    Cardiogenic + septic shock.  He is on milrinone with good co-ox.   CT abdomen 12/10: Localized peri-appendiceal fluid but no frank abscess.  Will continue current abx and fungal coverage.   Continue amio and heparin for AF, frequent PVCs increase 60 mg/hr. On DAPT for recent stent.   The patient is critically ill with multiple organ systems failure and requires high complexity decision making for assessment and support, frequent evaluation and titration of therapies, application of advanced monitoring technologies and extensive interpretation of multiple databases.   Amy Clegg  NP-C   06/07/2015 7:20 AM  Length of Stay: 10   06/07/2015, 7:20 AM  Advanced Heart Failure Team Pager [phone] (M-F; 7a - 4p)  Please contact [facility] for night-coverage after hours (4p -7a ) and weekends on [email or web address]   Patient seen and examined with Scott Becket, NP. We discussed all aspects of the encounter. I agree with the assessment and plan as stated above.   He is worse again today. Volume status improved with CVVHD but still about 15 pounds up from baseline. Remains anuric. AF and VT quiescent on IV amiodarone. Main issue is worsening respiratory status. CXR reviewed personally and no acute process. He is dyssynchronous with vent> Will d/w CCM about trying paralytics.   CAD is stable. Continue Brilinta.   CT abdomen without evidence of abscess.   The patient is critically ill with multiple organ systems failure and requires high complexity decision making for assessment and support, frequent evaluation and titration of therapies, application of advanced monitoring technologies and extensive interpretation of multiple databases.   Critical Care Time devoted to patient care services described in this note is 35 Minutes.  swollen glands

## 2015-06-07 NOTE — Progress Notes (Signed)
10 Days Post-Op  Subjective: He was off pressors yesterday but BP down this Am and he is back on them.  Sedated on Vent  Objective: Vital signs in last 24 hours: Temp:  [97.7 F (36.5 C)-98.2 F (36.8 C)] 97.7 F (36.5 C) (12/12 0400) Pulse Rate:  [77-94] 77 (12/12 0735) Resp:  [13-26] 21 (12/12 0735) BP: (90-106)/(47-62) 95/52 mmHg (12/12 0400) SpO2:  [90 %-100 %] 95 % (12/12 0735) Arterial Line BP: (74-145)/(41-64) 74/41 mmHg (12/12 0700) FiO2 (%):  [60 %-80 %] 80 % (12/12 0735) Weight:  [126.4 kg (278 lb 10.6 oz)] 126.4 kg (278 lb 10.6 oz) (12/12 0322) Last BM Date: 05/27/15 NG 1500,  Urine 45 CVVHD - 9614 Afebrile, BP in the 90's On Vent 60% FiO2 Creatine 2/41, WBC 25.3 up some Jejunostomy tube feeds at 70 ml per hour. Intake/Output from previous day: 12/11 0701 - 12/12 0700 In: 3964.4 [I.V.:1774.4; NG/GT:1280; IV Piggyback:900] Out: 21308 [Urine:45; Emesis/NG output:1500] Intake/Output this shift:    General appearance: sedated on Vent Resp: some mild wheezing on vent, full support GI: soft, no BS, on Vent/Sedated.  Lab Results:   Recent Labs  06/06/15 0345 06/07/15 0345  WBC 20.8* 25.3*  HGB 8.1* 8.5*  HCT 23.1* 24.8*  PLT 164 171    BMET  Recent Labs  06/06/15 1615 06/07/15 0345  NA 135 136  K 4.2 4.0  CL 100* 100*  CO2 24 24  GLUCOSE 135* 138*  BUN 48* 54*  CREATININE 2.33* 2.41*  CALCIUM 8.1* 8.1*   PT/INR No results for input(s): LABPROT, INR in the last 72 hours.   Recent Labs Lab 06/05/15 0500 06/05/15 1645 06/06/15 0345 06/06/15 1615 06/07/15 0345  ALBUMIN 2.4* 2.7* 2.9* 2.7* 2.7*     Lipase     Component Value Date/Time   LIPASE 21 06/01/2015 0445     Studies/Results: Dg Chest Port 1 View  06/07/2015  CLINICAL DATA:  Check endotracheal tube placement EXAM: PORTABLE CHEST - 1 VIEW COMPARISON:  06/07/2015 FINDINGS: Cardiac shadow is mildly enlarged but stable. A nasogastric catheter is noted extending into the stomach.  A feeding catheter extends into the stomach as well. An endotracheal tube is seen 3 cm above the carina. A left jugular central line and right jugular sheath are again seen and stable. Bibasilar changes are noted right greater than left but stable in appearance. No new focal abnormality is seen. IMPRESSION: Tubes and lines as described. Bibasilar changes right greater than left. This is stable from the prior exam. Electronically Signed   By: Alcide Clever M.D.   On: 06/07/2015 07:59   Dg Chest Port 1 View  06/07/2015  CLINICAL DATA:  Acute respiratory failure. EXAM: PORTABLE CHEST 1 VIEW COMPARISON:  06/06/2015. FINDINGS: Endotracheal tube and NG tube in stable position. Right IJ sheath in stable position. Left IJ line in stable position. Persistent low lung volumes with bibasilar atelectasis and/or infiltrates. Cardiomegaly with interim increase in pulmonary vascularity and interstitial markings consistent with mild congestive heart failure. IMPRESSION: 1. Lines and tubes in stable position. 2. Persistent low lung volumes with bibasilar atelectasis and/or infiltrates. 3. Cardiomegaly with new onset mild pulmonary vascular prominence and bilateral interstitial prominence consistent with congestive heart failure. Electronically Signed   By: Maisie Fus  Register   On: 06/07/2015 07:15   Dg Chest Port 1 View  06/06/2015  CLINICAL DATA:  Acute respiratory failure, in tube EXAM: PORTABLE CHEST 1 VIEW COMPARISON:  06/01/2015 FINDINGS: Stable endotracheal and NG tube position. No  pulmonary edema. To S1 grounds catheter has been removed. Balloon pump is unchanged in position. There is right IJ sheath in place. Small right pleural effusion with right basilar atelectasis or infiltrate. IMPRESSION: Stable support apparatus. Right Swan-Ganz catheter has been removed. No pulmonary edema. There is small right pleural effusion with right basilar atelectasis or infiltrate. Electronically Signed   By: Natasha Mead M.D.   On:  06/06/2015 14:01   Dg Abd Portable 1v  06/05/2015  CLINICAL DATA:  Feeding tube placement EXAM: PORTABLE ABDOMEN - 1 VIEW COMPARISON:  06/05/2015, CT scan abdomen and pelvis. FINDINGS: NG tube with tip in distal stomach again noted. There is a NG feeding tube with tip within duodenum. IMPRESSION: NG tube with tip in distal stomach again noted. There is a NG feeding tube with tip within duodenum. Electronically Signed   By: Natasha Mead M.D.   On: 06/05/2015 15:21    Medications: . anidulafungin  100 mg Intravenous Q24H  . antiseptic oral rinse  7 mL Mouth Rinse 10 times per day  . arformoterol  15 mcg Nebulization BID  . aspirin  81 mg Per Tube Daily  . atorvastatin  80 mg Per NG tube q1800  . budesonide (PULMICORT) nebulizer solution  0.5 mg Nebulization BID  . chlorhexidine gluconate  15 mL Mouth Rinse BID  . Chlorhexidine Gluconate Cloth  6 each Topical Q0600  . clopidogrel  75 mg Per NG tube Daily  . fentaNYL (SUBLIMAZE) injection  50 mcg Intravenous Once  . hydrocortisone sod succinate (SOLU-CORTEF) inj  100 mg Intravenous Q12H  . Influenza vac split quadrivalent PF  0.5 mL Intramuscular Tomorrow-1000  . insulin aspart  0-20 Units Subcutaneous 6 times per day  . meropenem (MERREM) IV  1 g Intravenous 3 times per day  . pantoprazole sodium  40 mg Per Tube Daily  . sodium chloride  10-40 mL Intracatheter Q12H  . vancomycin  1,500 mg Intravenous Q24H   . sodium chloride 0 mL/hr at 06/03/15 1600  . sodium chloride Stopped (06/04/15 1200)  . amiodarone 30 mg/hr (06/07/15 0718)  . feeding supplement (VITAL HIGH PROTEIN) 1,000 mL (06/07/15 0400)  . fentaNYL infusion INTRAVENOUS 200 mcg/hr (06/06/15 2200)  . heparin 950 Units/hr (06/06/15 2210)  . midazolam (VERSED) infusion 4 mg/hr (06/07/15 0247)  . milrinone 0.375 mcg/kg/min (06/07/15 0400)  . norepinephrine (LEVOPHED) Adult infusion 6 mcg/min (06/07/15 0718)  . dialysis replacement fluid (prismasate) 500 mL/hr at 06/07/15 0806  .  dialysis replacement fluid (prismasate) 300 mL/hr at 06/06/15 1007  . dialysate (PRISMASATE) 1,000 mL/hr at 06/07/15 0805  . vasopressin (PITRESSIN) infusion - *FOR SHOCK* 0.03 Units/min (06/05/15 1412)    Assessment/Plan Necrotic Appendicitis s/p aborted appy secondary to Ventricular fibrillation arrest/CPR/defibrillation  Acute inferior STEMI with complete occlusion of the RCA, PTCA and RCA stent 06/21/2015/Complete heart block/RV infarct/ Torsade 06/04/15 converted with one shock New onset AF Acute respiratory failure requiring max ventilator support Cardiogenic and septic shock/ IABP placement Acute renal failure  - CVVHD Adrenal insuffiencey Anemia Antibiotics:  Vancomycin/Meropenem day 10, antiulafungin day 6 DVT:  Heparin drip/SCD   Plan:  We would continue his medical management of the appendicitis, and defer management of Cardiac, pulmonary and sepsis to CCM and Cardiology.    LOS: 10 days    Michaele Amundson 06/07/2015

## 2015-06-08 ENCOUNTER — Inpatient Hospital Stay (HOSPITAL_COMMUNITY): Payer: Medicaid Other

## 2015-06-08 DIAGNOSIS — L8990 Pressure ulcer of unspecified site, unspecified stage: Secondary | ICD-10-CM | POA: Insufficient documentation

## 2015-06-08 LAB — GLUCOSE, CAPILLARY
GLUCOSE-CAPILLARY: 180 mg/dL — AB (ref 65–99)
GLUCOSE-CAPILLARY: 113 mg/dL — AB (ref 65–99)
GLUCOSE-CAPILLARY: 182 mg/dL — AB (ref 65–99)
GLUCOSE-CAPILLARY: 159 mg/dL — AB (ref 65–99)
Glucose-Capillary: 163 mg/dL — ABNORMAL HIGH (ref 65–99)
Glucose-Capillary: 200 mg/dL — ABNORMAL HIGH (ref 65–99)

## 2015-06-08 LAB — BLOOD GAS, ARTERIAL
Acid-base deficit: 2.2 mmol/L — ABNORMAL HIGH (ref 0.0–2.0)
Acid-base deficit: 2.9 mmol/L — ABNORMAL HIGH (ref 0.0–2.0)
BICARBONATE: 23.6 meq/L (ref 20.0–24.0)
Bicarbonate: 21.9 mEq/L (ref 20.0–24.0)
DRAWN BY: 24487
FIO2: 0.6
FIO2: 0.8
LHR: 14 {breaths}/min
O2 Saturation: 92.6 %
O2 Saturation: 98.8 %
PATIENT TEMPERATURE: 98.6
PATIENT TEMPERATURE: 98.3
PCO2 ART: 40.7 mmHg (ref 35.0–45.0)
PEEP/CPAP: 10 cmH2O
PEEP: 10 cmH2O
PO2 ART: 78.5 mmHg — AB (ref 80.0–100.0)
PO2 ART: 152 mmHg — AB (ref 80.0–100.0)
Pressure control: 14 cmH2O
Pressure control: 30 cmH2O
RATE: 15 resp/min
TCO2: 25.2 mmol/L (ref 0–100)
TCO2: 23.1 mmol/L (ref 0–100)
pCO2 arterial: 52.1 mmHg — ABNORMAL HIGH (ref 35.0–45.0)
pH, Arterial: 7.279 — ABNORMAL LOW (ref 7.350–7.450)
pH, Arterial: 7.349 — ABNORMAL LOW (ref 7.350–7.450)

## 2015-06-08 LAB — RENAL FUNCTION PANEL
ALBUMIN: 2.6 g/dL — AB (ref 3.5–5.0)
ALBUMIN: 2.6 g/dL — AB (ref 3.5–5.0)
ANION GAP: 9 (ref 5–15)
ANION GAP: 11 (ref 5–15)
BUN: 59 mg/dL — AB (ref 6–20)
BUN: 60 mg/dL — AB (ref 6–20)
CALCIUM: 8.2 mg/dL — AB (ref 8.9–10.3)
CALCIUM: 8.1 mg/dL — AB (ref 8.9–10.3)
CO2: 26 mmol/L (ref 22–32)
CO2: 24 mmol/L (ref 22–32)
Chloride: 100 mmol/L — ABNORMAL LOW (ref 101–111)
Chloride: 99 mmol/L — ABNORMAL LOW (ref 101–111)
Creatinine, Ser: 2.43 mg/dL — ABNORMAL HIGH (ref 0.61–1.24)
Creatinine, Ser: 2.44 mg/dL — ABNORMAL HIGH (ref 0.61–1.24)
GFR calc Af Amer: 32 mL/min — ABNORMAL LOW (ref >60)
GFR calc Af Amer: 32 mL/min — ABNORMAL LOW (ref >60)
GFR calc non Af Amer: 27 mL/min — ABNORMAL LOW (ref >60)
GFR, EST NON AFRICAN AMERICAN: 27 mL/min — AB (ref >60)
GLUCOSE: 174 mg/dL — AB (ref 65–99)
Glucose, Bld: 190 mg/dL — ABNORMAL HIGH (ref 65–99)
PHOSPHORUS: 4.2 mg/dL (ref 2.5–4.6)
PHOSPHORUS: 4.5 mg/dL (ref 2.5–4.6)
POTASSIUM: 4.9 mmol/L (ref 3.5–5.1)
Potassium: 4.8 mmol/L (ref 3.5–5.1)
SODIUM: 135 mmol/L (ref 135–145)
Sodium: 134 mmol/L — ABNORMAL LOW (ref 135–145)

## 2015-06-08 LAB — CBC
HCT: 26.3 % — ABNORMAL LOW (ref 39.0–52.0)
HEMATOCRIT: 25.4 % — AB (ref 39.0–52.0)
Hemoglobin: 8.9 g/dL — ABNORMAL LOW (ref 13.0–17.0)
Hemoglobin: 8.6 g/dL — ABNORMAL LOW (ref 13.0–17.0)
MCH: 28.3 pg (ref 26.0–34.0)
MCH: 28.2 pg (ref 26.0–34.0)
MCHC: 33.8 g/dL (ref 30.0–36.0)
MCHC: 33.9 g/dL (ref 30.0–36.0)
MCV: 83.8 fL (ref 78.0–100.0)
MCV: 83.3 fL (ref 78.0–100.0)
PLATELETS: 162 10*3/uL (ref 150–400)
Platelets: 156 10*3/uL (ref 150–400)
RBC: 3.14 MIL/uL — ABNORMAL LOW (ref 4.22–5.81)
RBC: 3.05 MIL/uL — AB (ref 4.22–5.81)
RDW: 15.6 % — ABNORMAL HIGH (ref 11.5–15.5)
RDW: 15.5 % (ref 11.5–15.5)
WBC: 27.4 10*3/uL — ABNORMAL HIGH (ref 4.0–10.5)
WBC: 26.4 10*3/uL — AB (ref 4.0–10.5)

## 2015-06-08 LAB — BASIC METABOLIC PANEL
ANION GAP: 10 (ref 5–15)
BUN: 58 mg/dL — ABNORMAL HIGH (ref 6–20)
CALCIUM: 8.2 mg/dL — AB (ref 8.9–10.3)
CHLORIDE: 100 mmol/L — AB (ref 101–111)
CO2: 26 mmol/L (ref 22–32)
CREATININE: 2.42 mg/dL — AB (ref 0.61–1.24)
GFR calc non Af Amer: 28 mL/min — ABNORMAL LOW (ref >60)
GFR, EST AFRICAN AMERICAN: 32 mL/min — AB (ref >60)
Glucose, Bld: 165 mg/dL — ABNORMAL HIGH (ref 65–99)
Potassium: 4.7 mmol/L (ref 3.5–5.1)
SODIUM: 136 mmol/L (ref 135–145)

## 2015-06-08 LAB — MAGNESIUM: Magnesium: 2.6 mg/dL — ABNORMAL HIGH (ref 1.7–2.4)

## 2015-06-08 LAB — CARBOXYHEMOGLOBIN
Carboxyhemoglobin: 0.9 % (ref 0.5–1.5)
Methemoglobin: 0.9 % (ref 0.0–1.5)
O2 SAT: 73.4 %
TOTAL HEMOGLOBIN: 10.5 g/dL — AB (ref 13.5–18.0)

## 2015-06-08 LAB — OCCULT BLOOD GASTRIC / DUODENUM (SPECIMEN CUP): OCCULT BLOOD, GASTRIC: POSITIVE — AB

## 2015-06-08 LAB — HEPARIN LEVEL (UNFRACTIONATED): HEPARIN UNFRACTIONATED: 0.31 [IU]/mL (ref 0.30–0.70)

## 2015-06-08 LAB — VANCOMYCIN, TROUGH: Vancomycin Tr: 38 ug/mL (ref 10.0–20.0)

## 2015-06-08 LAB — PHOSPHORUS: Phosphorus: 4.3 mg/dL (ref 2.5–4.6)

## 2015-06-08 NOTE — Progress Notes (Signed)
11 Days Post-Op  Subjective: No real improvement, still on pressors, amiodarone, heparin, dialysis,ventilator, with tube feeds.  No BM recorded that I can see going back to 05/31/15.  His tube feeding is at 75 ml/hour.    Objective: Vital signs in last 24 hours: Temp:  [97.4 F (36.3 C)-98.3 F (36.8 C)] 98.3 F (36.8 C) (12/13 0400) Pulse Rate:  [70-80] 70 (12/13 0739) Resp:  [0-20] 19 (12/13 0739) BP: (87-107)/(45-57) 89/49 mmHg (12/13 0400) SpO2:  [93 %-99 %] 98 % (12/13 0700) Arterial Line BP: (78-117)/(37-51) 105/50 mmHg (12/13 0700) FiO2 (%):  [80 %-100 %] 80 % (12/13 0600) Weight:  [124.2 kg (273 lb 13 oz)] 124.2 kg (273 lb 13 oz) (12/13 0500) Last BM Date: 05/27/15 5 liters IV intake recorded 11 cc urine recorded 1800 from the NG 6.9 liters from HD Afebrile, BP down and on pressors WBC is going up  Intake/Output from previous day: 12/12 0701 - 12/13 0700 In: 5032.2 [I.V.:2827.2; NG/GT:1805; IV Piggyback:400] Out: 8753 [Urine:11; Emesis/NG output:1800] Intake/Output this shift:    General appearance: sedated and on vent GI: soft, no BS  Lab Results:   Recent Labs  06/07/15 0345 06/08/15 0245  WBC 25.3* 27.4*  HGB 8.5* 8.9*  HCT 24.8* 26.3*  PLT 171 162    BMET  Recent Labs  06/08/15 0245 06/08/15 0520  NA 136  135 134*  K 4.7  4.8 4.9  CL 100*  100* 99*  CO2 GLUCOSE 165*  174* 190*  BUN 58*  59* 60*  CREATININE 2.42*  2.43* 2.44*  CALCIUM 8.2*  8.2* 8.1*   PT/INR No results for input(s): LABPROT, INR in the last 72 hours.   Recent Labs Lab 06/06/15 1615 06/07/15 0345 06/07/15 1640 06/08/15 0245 06/08/15 0520  ALBUMIN 2.7* 2.7* 2.6* 2.6* 2.6*     Lipase     Component Value Date/Time   LIPASE 21 June 02, 2015 0445     Studies/Results: Dg Chest Port 1 View  06/08/2015  CLINICAL DATA:  Intubation. EXAM: PORTABLE CHEST 1 VIEW COMPARISON:  06/07/2015. FINDINGS: Endotracheal tube and NG tube in stable position. Right  IJ sheath in stable position. Stable cardiomegaly. Persistent bibasilar infiltrates and or edema noted. Small right pleural effusion. No pneumothorax. IMPRESSION: 1. Lines and tubes in stable position. 2. Persistent bibasilar infiltrates and/or edema. Small right pleural effusion. 3. Stable cardiomegaly. Electronically Signed   By: Maisie Fus  Register   On: 06/08/2015 07:18   Dg Chest Port 1 View  06/07/2015  CLINICAL DATA:  Check endotracheal tube placement EXAM: PORTABLE CHEST - 1 VIEW COMPARISON:  06/07/2015 FINDINGS: Cardiac shadow is mildly enlarged but stable. A nasogastric catheter is noted extending into the stomach. A feeding catheter extends into the stomach as well. An endotracheal tube is seen 3 cm above the carina. A left jugular central line and right jugular sheath are again seen and stable. Bibasilar changes are noted right greater than left but stable in appearance. No new focal abnormality is seen. IMPRESSION: Tubes and lines as described. Bibasilar changes right greater than left. This is stable from the prior exam. Electronically Signed   By: Alcide Clever M.D.   On: 06/07/2015 07:59   Dg Chest Port 1 View  06/07/2015  CLINICAL DATA:  Acute respiratory failure. EXAM: PORTABLE CHEST 1 VIEW COMPARISON:  06/06/2015. FINDINGS: Endotracheal tube and NG tube in stable position. Right IJ sheath in stable position. Left IJ line in stable position. Persistent low lung volumes  with bibasilar atelectasis and/or infiltrates. Cardiomegaly with interim increase in pulmonary vascularity and interstitial markings consistent with mild congestive heart failure. IMPRESSION: 1. Lines and tubes in stable position. 2. Persistent low lung volumes with bibasilar atelectasis and/or infiltrates. 3. Cardiomegaly with new onset mild pulmonary vascular prominence and bilateral interstitial prominence consistent with congestive heart failure. Electronically Signed   By: Maisie Fus  Register   On: 06/07/2015 07:15   Dg Chest  Port 1 View  06/06/2015  CLINICAL DATA:  Acute respiratory failure, in tube EXAM: PORTABLE CHEST 1 VIEW COMPARISON:  06/01/2015 FINDINGS: Stable endotracheal and NG tube position. No pulmonary edema. To S1 grounds catheter has been removed. Balloon pump is unchanged in position. There is right IJ sheath in place. Small right pleural effusion with right basilar atelectasis or infiltrate. IMPRESSION: Stable support apparatus. Right Swan-Ganz catheter has been removed. No pulmonary edema. There is small right pleural effusion with right basilar atelectasis or infiltrate. Electronically Signed   By: Natasha Mead M.D.   On: 06/06/2015 14:01    Medications: . anidulafungin  100 mg Intravenous Q24H  . antiseptic oral rinse  7 mL Mouth Rinse 10 times per day  . arformoterol  15 mcg Nebulization BID  . aspirin  81 mg Per Tube Daily  . atorvastatin  80 mg Per NG tube q1800  . budesonide (PULMICORT) nebulizer solution  0.5 mg Nebulization BID  . chlorhexidine gluconate  15 mL Mouth Rinse BID  . Chlorhexidine Gluconate Cloth  6 each Topical Q0600  . clopidogrel  75 mg Per NG tube Daily  . fentaNYL (SUBLIMAZE) injection  50 mcg Intravenous Once  . hydrocortisone sod succinate (SOLU-CORTEF) inj  100 mg Intravenous Q12H  . Influenza vac split quadrivalent PF  0.5 mL Intramuscular Tomorrow-1000  . insulin aspart  0-20 Units Subcutaneous 6 times per day  . meropenem (MERREM) IV  1 g Intravenous 3 times per day  . pantoprazole sodium  40 mg Per Tube Daily  . sodium chloride  10-40 mL Intracatheter Q12H   . sodium chloride 0 mL/hr at 06/03/15 1600  . sodium chloride Stopped (06/04/15 1200)  . amiodarone 30 mg/hr (06/08/15 0718)  . feeding supplement (VITAL HIGH PROTEIN) 1,000 mL (06/07/15 1630)  . fentaNYL infusion INTRAVENOUS 250 mcg/hr (06/08/15 0612)  . heparin 950 Units/hr (06/08/15 0126)  . midazolam (VERSED) infusion 6 mg/hr (06/08/15 0612)  . milrinone 0.375 mcg/kg/min (06/08/15 0400)  .  norepinephrine (LEVOPHED) Adult infusion 17 mcg/min (06/08/15 0700)  . dialysis replacement fluid (prismasate) 500 mL/hr at 06/08/15 0511  . dialysis replacement fluid (prismasate) 300 mL/hr at 06/06/15 1007  . dialysate (PRISMASATE) 1,000 mL/hr at 06/08/15 0510  . vasopressin (PITRESSIN) infusion - *FOR SHOCK* 0.03 Units/min (06/07/15 2059)   Assessment/Plan Necrotic Appendicitis s/p aborted appy secondary to Ventricular fibrillation arrest/CPR/defibrillation  Acute inferior STEMI with complete occlusion of the RCA, PTCA and RCA stent 06/23/2015/Complete heart block/RV infarct/ Torsade 06/04/15 converted with one shock New onset AF Acute respiratory failure requiring max ventilator support Cardiogenic and septic shock/ IABP placement Acute renal failure - CVVHD Adrenal insuffiencey Anemia Antibiotics: Vancomycin/Meropenem day 11, antiulafungin day 7 DVT: Heparin drip/SCD   Plan:  Continue non operative management of his appendicitis      LOS: 11 days    Scott Holden 06/08/2015

## 2015-06-08 NOTE — Progress Notes (Signed)
Patient ID: Scott Holden, male   DOB: [birth year], 59 y.o.   MRN: [id number] S:Hypotension overnight requiring addition of vasopressin and increased levophed O:BP 89/49 mmHg  Pulse 80  Temp(Src) 98.3 F (36.8 C) (Oral)  Resp 15  Ht  (1.778 m)  Wt 124.2 kg (273 lb 13 oz)  BMI 39.29 kg/m2  SpO2 98%  Intake/Output Summary (Last 24 hours) at 06/08/15 0735 Last data filed at 06/08/15 0700  Gross per 24 hour  Intake 5022.15 ml  Output   8753 ml  Net -3730.85 ml   Intake/Output: I/O last 3 completed shifts: In: 7414.6 [I.V.:3739.6; NG/GT:2575; IV Piggyback:1100] Out: 40981 [Urine:16; Emesis/NG output:2575; Other:10975]  Intake/Output this shift:    Weight change: -2.2 kg (-4 lb 13.6 oz) XBJ:YNWGN WM intubated and sedated CVS:no rub Resp:occ rhonchi Abd:+BS, soft FAO:ZHYQMVH edema of hands and presacrum   Recent Labs Lab 06/05/15 1645 06/06/15 0345 06/06/15 1615 06/07/15 0345 06/07/15 1640 06/08/15 0245 06/08/15 0520  NA 134* 135 135 136 136 136  135 134*  K 4.2 4.2 4.2 4.0 4.5 4.7  4.8 4.9  CL 99* 100* 100* 100* 102 100*  100* 99*  CO2 GLUCOSE 133* 133* 135* 138* 145* 165*  174* 190*  BUN 42* 45* 48* 54* 54* 58*  59* 60*  CREATININE 2.10* 2.23* 2.33* 2.41* 2.32* 2.42*  2.43* 2.44*  ALBUMIN 2.7* 2.9* 2.7* 2.7* 2.6* 2.6* 2.6*  CALCIUM 7.6* 7.9* 8.1* 8.1* 8.2* 8.2*  8.2* 8.1*  PHOS 3.6 3.3 3.3 3.7 4.4 4.3  4.2 4.5   Liver Function Tests:  Recent Labs Lab 06/07/15 1640 06/08/15 0245 06/08/15 0520  ALBUMIN 2.6* 2.6* 2.6*   No results for input(s): LIPASE, AMYLASE in the last 168 hours. No results for input(s): AMMONIA in the last 168 hours. CBC:  Recent Labs Lab 06/03/15 0805 06/04/15 0310 06/05/15 0500 06/06/15 0345 06/07/15 0345 06/08/15 0245  WBC 25.1* 22.2* 23.2* 20.8* 25.3* 27.4*  NEUTROABS 22.5*  --   --   --   --   --   HGB 10.5* 9.9* 8.8* 8.1* 8.5* 8.9*  HCT 30.8* 29.2* 26.0* 23.1* 24.8* 26.3*  MCV 84.8  87.2 85.8 84.6 82.4 83.8  PLT 278 179 175 164 171 162   Cardiac Enzymes: No results for input(s): CKTOTAL, CKMB, CKMBINDEX, TROPONINI in the last 168 hours. CBG:  Recent Labs Lab 06/07/15 1236 06/07/15 1626 06/07/15 1953 06/07/15 2329 06/08/15 0524  GLUCAP 129* 140* 128* 148* 180*    Iron Studies: No results for input(s): IRON, TIBC, TRANSFERRIN, FERRITIN in the last 72 hours. Studies/Results: Dg Chest Port 1 View  06/08/2015  CLINICAL DATA:  Intubation. EXAM: PORTABLE CHEST 1 VIEW COMPARISON:  06/07/2015. FINDINGS: Endotracheal tube and NG tube in stable position. Right IJ sheath in stable position. Stable cardiomegaly. Persistent bibasilar infiltrates and or edema noted. Small right pleural effusion. No pneumothorax. IMPRESSION: 1. Lines and tubes in stable position. 2. Persistent bibasilar infiltrates and/or edema. Small right pleural effusion. 3. Stable cardiomegaly. Electronically Signed   By: Maisie Fus  Register   On: 06/08/2015 07:18   Dg Chest Port 1 View  06/07/2015  CLINICAL DATA:  Check endotracheal tube placement EXAM: PORTABLE CHEST - 1 VIEW COMPARISON:  06/07/2015 FINDINGS: Cardiac shadow is mildly enlarged but stable. A nasogastric catheter is noted extending into the stomach. A feeding catheter extends into the stomach as well. An endotracheal tube is seen 3 cm above the carina. A left  jugular central line and right jugular sheath are again seen and stable. Bibasilar changes are noted right greater than left but stable in appearance. No new focal abnormality is seen. IMPRESSION: Tubes and lines as described. Bibasilar changes right greater than left. This is stable from the prior exam. Electronically Signed   By: Alcide Clever M.D.   On: 06/07/2015 07:59   Dg Chest Port 1 View  06/07/2015  CLINICAL DATA:  Acute respiratory failure. EXAM: PORTABLE CHEST 1 VIEW COMPARISON:  06/06/2015. FINDINGS: Endotracheal tube and NG tube in stable position. Right IJ sheath in stable  position. Left IJ line in stable position. Persistent low lung volumes with bibasilar atelectasis and/or infiltrates. Cardiomegaly with interim increase in pulmonary vascularity and interstitial markings consistent with mild congestive heart failure. IMPRESSION: 1. Lines and tubes in stable position. 2. Persistent low lung volumes with bibasilar atelectasis and/or infiltrates. 3. Cardiomegaly with new onset mild pulmonary vascular prominence and bilateral interstitial prominence consistent with congestive heart failure. Electronically Signed   By: Maisie Fus  Register   On: 06/07/2015 07:15   Dg Chest Port 1 View  06/06/2015  CLINICAL DATA:  Acute respiratory failure, in tube EXAM: PORTABLE CHEST 1 VIEW COMPARISON:  06/01/2015 FINDINGS: Stable endotracheal and NG tube position. No pulmonary edema. To S1 grounds catheter has been removed. Balloon pump is unchanged in position. There is right IJ sheath in place. Small right pleural effusion with right basilar atelectasis or infiltrate. IMPRESSION: Stable support apparatus. Right Swan-Ganz catheter has been removed. No pulmonary edema. There is small right pleural effusion with right basilar atelectasis or infiltrate. Electronically Signed   By: Natasha Mead M.D.   On: 06/06/2015 14:01   . anidulafungin  100 mg Intravenous Q24H  . antiseptic oral rinse  7 mL Mouth Rinse 10 times per day  . arformoterol  15 mcg Nebulization BID  . aspirin  81 mg Per Tube Daily  . atorvastatin  80 mg Per NG tube q1800  . budesonide (PULMICORT) nebulizer solution  0.5 mg Nebulization BID  . chlorhexidine gluconate  15 mL Mouth Rinse BID  . Chlorhexidine Gluconate Cloth  6 each Topical Q0600  . clopidogrel  75 mg Per NG tube Daily  . fentaNYL (SUBLIMAZE) injection  50 mcg Intravenous Once  . hydrocortisone sod succinate (SOLU-CORTEF) inj  100 mg Intravenous Q12H  . Influenza vac split quadrivalent PF  0.5 mL Intramuscular Tomorrow-1000  . insulin aspart  0-20 Units Subcutaneous  6 times per day  . meropenem (MERREM) IV  1 g Intravenous 3 times per day  . pantoprazole sodium  40 mg Per Tube Daily  . sodium chloride  10-40 mL Intracatheter Q12H    BMET    Component Value Date/Time   NA 134* 06/08/2015 0520   NA 140 01/15/2014 0910   K 4.9 06/08/2015 0520   CL 99* 06/08/2015 0520   CO2 24 06/08/2015 0520   GLUCOSE 190* 06/08/2015 0520   GLUCOSE 121* 01/15/2014 0910   BUN 60* 06/08/2015 0520   BUN 9 01/15/2014 0910   CREATININE 2.44* 06/08/2015 0520   CALCIUM 8.1* 06/08/2015 0520   GFRNONAA 27* 06/08/2015 0520   GFRAA 32* 06/08/2015 0520   CBC    Component Value Date/Time   WBC 27.4* 06/08/2015 0245   WBC 11.3* 01/15/2014 0910   RBC 3.14* 06/08/2015 0245   RBC 4.86 01/15/2014 0910   HGB 8.9* 06/08/2015 0245   HCT 26.3* 06/08/2015 0245   PLT 162 06/08/2015 0245   MCV  83.8 06/08/2015 0245   MCH 28.3 06/08/2015 0245   MCH 29.6 01/15/2014 0910   MCHC 33.8 06/08/2015 0245   MCHC 33.3 01/15/2014 0910   RDW 15.6* 06/08/2015 0245   RDW 15.2 01/15/2014 0910   LYMPHSABS 1.3 06/03/2015 0805   LYMPHSABS 3.2* 01/15/2014 0910   MONOABS 1.3* 06/03/2015 0805   EOSABS 0.0 06/03/2015 0805   EOSABS 0.3 01/15/2014 0910   BASOSABS 0.0 06/03/2015 0805   BASOSABS 0.0 01/15/2014 0910     Assessment/Plan: 1. ARF, non-oliguric in setting of cardiogenic shock/sepsis and IV contrast as well as bilateral hydronephrosis and obstructing stone in right ureter. He is requiring pressors and despite being non-oliguric he is unable to keep up with the volume administered due to pressors/meds and was markedly volume overloaded. Discussed case with Dr. Gala Romney and CVVHD initiated on 06/01/15 due to hypoxemia and pulmonary edema. 1. Not tolerating CVVHD with UF as well over the last 24 hours requiring increased pressors, CVP stable at 12.   2. Will continue to keep even for now. 3. Appreciate Dr. Prescott Gum assistance with dialysis access 2. Acute inferior STEMI with RV  involvement- s/p PTCA and DES placement in RCA 12/2 complicated by cardiogenic shock requiring an IABP and pressors. IABP removed 06/03/15 and taper pressors as able. 3. Acute appendicitis with gangrenous changes but unable to remove due to his cardiac arrest and critical illness. Continue with antibiotics and pressors/supportive care 4. VDRF with increased oxygen requirements and marked volume overload. After initiation of CVVHDF able to wean FIO2 somewhat.  He was stacking breaths yesterday but is more comfortable today. plan per PCCM. 5. SIRS/multi-organ failure- cardiogenic and sepsis. On increased pressors 6. Acute systolic heart failure with cardiogenic shock and biventricular failure. Inadequate response from lasix gtt and metolazone/diuril. Plan for CVVHDF as above 7. Recurrent VT- per Cardiology 8. Hypocalcemia- improved with CVVHD  9. HEME- on heparin gtt as well as angiomax 10. Nephrolithiasis with obstructive uropathy- too unstable for any procedure at this time. Continue with supportive measures 11. Disposition- overall prognosis is grim due to multiple issues which cannot be addressed due to his critical condition. Patient's pastor is with his family, however his sister would like to continue with aggressive therapy for now. This may need to be addressed if his condition continues to deteriorate.  Tylisa Alcivar A

## 2015-06-08 NOTE — Progress Notes (Signed)
ANTICOAGULATION CONSULT NOTE - Follow Up Consult  Pharmacy Consult for heparin Indication: atrial fibrillation  No Known Allergies  Patient Measurements: Height: 5\' 10"  (177.8 cm) Weight: 273 lb 13 oz (124.2 kg) IBW/kg (Calculated) : 73 Heparin Dosing Weight: 109 kg  Vital Signs: Temp: 99 F (37.2 C) (12/13 0800) Temp Source: Oral (12/13 0800) BP: 98/50 mmHg (12/13 0800) Pulse Rate: 70 (12/13 0801)  Labs:  Recent Labs  06/06/15 0345  06/07/15 0345 06/07/15 1640 06/08/15 0245 06/08/15 0520  HGB 8.1*  --  8.5*  --  8.9*  --   HCT 23.1*  --  24.8*  --  26.3*  --   PLT 164  --  171  --  162  --   HEPARINUNFRC 0.56  --  0.43  --  0.31  --   CREATININE 2.23*  < > 2.41* 2.32* 2.42*  2.43* 2.44*  < > = values in this interval not displayed.  Estimated Creatinine Clearance: 43.1 mL/min (by C-G formula based on Cr of 2.44).  Assessment: 59 yo m with acute appendicitis and was taken to the OR for a laparoscopic appendectomy. In the OR he went into cardiac arrest. EKG showed CHB and ST elevations - Code STEMI was called. He was transferred to [facility] and taken emergently to the cath lab where he received a BMS to his RCA, temporary pacemaker and IABP were placed.   IABP now out, heparin continues to be at goal. CBC is stable, no overt bleeding noted. Continues in NSR ectopy improved amio back down to 30mg /hr. He also continues on DAPT.  Goal of Therapy:  Heparin level 0.3-0.7 units/ml Monitor platelets by anticoagulation protocol: Yes   Plan:  Continue heparin infusion at 950 units/hr Daily HL, CBC Monitor for s/sx of bleeding  Sheppard Coil PharmD., BCPS Clinical Pharmacist Pager [phone] 06/08/2015 10:28 AM

## 2015-06-08 NOTE — Procedures (Signed)
Bronchoscopy Procedure Note VISENTE BONDE [id number] 1955-11-17  Procedure: Bronchoscopy Indications: Diagnostic evaluation of the airways and Obtain specimens for culture and/or other diagnostic studies  Procedure Details Consent: Unable to obtain consent because of emergent medical necessity. Time Out: Verified patient identification, verified procedure, site/side was marked, verified correct patient position, special equipment/implants available, medications/allergies/relevent history reviewed, required imaging and test results available.  Performed  In preparation for procedure, patient was given 100% FiO2 and bronchoscope lubricated. Sedation: Benzodiazepines and Fentanyl  Airway entered and the following bronchi were examined: RUL, RML, RLL, LUL, LLL and Bronchi.   Significant mucous plugging throughout the bronchial tree worse on the left. Bronchoscope removed.  , Patient placed back on 100% FiO2 at conclusion of procedure.    Evaluation Hemodynamic Status: BP stable throughout; O2 sats: stable throughout Patient's Current Condition: stable Specimens:  Sent purulent fluid Complications: No apparent complications Patient did tolerate procedure well.   Pinchus Weckwerth 06/08/2015

## 2015-06-08 NOTE — Progress Notes (Signed)
PULMONARY / CRITICAL CARE MEDICINE   Name: Scott Holden MRN: [id number] DOB: 11/19/1955    ADMISSION DATE:  06/23/2015 CONSULTATION DATE:  05/29/2015   REFERRING MD :  Dr. Clifton James  CHIEF COMPLAINT:  Cardiogenic and septic shock  INITIAL PRESENTATION:  62M presented to [facility] with several days of progressively worsening RLQ pain found to have an acute appendicitis.  He went to the OR and suffered a VT arrest on the table after the insertion of the trochar.  He was transferred to [facility] after a code STEMI was called and the surgical intervention was aborted.  In the cath lab an RCA DES was placed and an IABP inserted for MI and cardiogenic shock.  While in cath lab, he required shock x6 for VF arrest and post return to ICU, shock x5.    SUBJECTIVE:  Sedated on vent; very asynchronous with the vent this AM.  VITAL SIGNS: Temp:  [97.4 F (36.3 C)-99 F (37.2 C)] 99 F (37.2 C) (12/13 0800) Pulse Rate:  [70-80] 70 (12/13 0801) Resp:  [0-22] 15 (12/13 0900) BP: (87-107)/(45-57) 98/50 mmHg (12/13 0800) SpO2:  [94 %-99 %] 96 % (12/13 0900) Arterial Line BP: (78-117)/(37-51) 104/47 mmHg (12/13 0900) FiO2 (%):  [80 %-100 %] 80 % (12/13 0801) Weight:  [124.2 kg (273 lb 13 oz)] 124.2 kg (273 lb 13 oz) (12/13 0500)   HEMODYNAMICS: CVP:  [7 mmHg-12 mmHg] 11 mmHg   VENTILATOR SETTINGS: Vent Mode:  [-] PCV FiO2 (%):  [80 %-100 %] 80 % Set Rate:  [15 bmp] 15 bmp PEEP:  [10 cmH20] 10 cmH20 Plateau Pressure:  [29 cmH20-32 cmH20] 32 cmH20   INTAKE / OUTPUT:  Intake/Output Summary (Last 24 hours) at 06/08/15 0957 Last data filed at 06/08/15 0901  Gross per 24 hour  Intake 5288.73 ml  Output   9626 ml  Net -4337.27 ml   PHYSICAL EXAMINATION: General:  Obese male, critically ill Neuro:  Sedated, follows commands on WUA HEENT:  ETT tube in place, moist mucous membranes Cardiovascular:  Regular rate and rhythm  Lungs:  Bilateral rhonchi, some asynchrony. Prolonged exhale w/ faint wheeze.   Abdomen:  Soft, tender, hypoactive.  Musculoskeletal:   Normal tone, no acute deformities  Skin:  Generalized edema/anasarca   LABS:  CBC  Recent Labs Lab 06/06/15 0345 06/07/15 0345 06/08/15 0245  WBC 20.8* 25.3* 27.4*  HGB 8.1* 8.5* 8.9*  HCT 23.1* 24.8* 26.3*  PLT 164 171 162   Coag's No results for input(s): APTT, INR in the last 168 hours.   BMET  Recent Labs Lab 06/07/15 1640 06/08/15 0245 06/08/15 0520  NA 136 136  135 134*  K 4.5 4.7  4.8 4.9  CL 102 100*  100* 99*  CO2 BUN 54* 58*  59* 60*  CREATININE 2.32* 2.42*  2.43* 2.44*  GLUCOSE 145* 165*  174* 190*   Electrolytes  Recent Labs Lab 06/06/15 0345  06/07/15 0345 06/07/15 1640 06/08/15 0245 06/08/15 0520  CALCIUM 7.9*  < > 8.1* 8.2* 8.2*  8.2* 8.1*  MG 2.7*  --  2.8*  --  2.6*  --   PHOS 3.3  < > 3.7 4.4 4.3  4.2 4.5  < > = values in this interval not displayed. Sepsis Markers No results for input(s): LATICACIDVEN, PROCALCITON, O2SATVEN in the last 168 hours. ABG  Recent Labs Lab 06/07/15 1240 06/07/15 2103 06/08/15 0345  PHART 7.322* 7.299* 7.349*  PCO2ART 52.0* 50.2* 40.7  PO2ART 124.0* 119* 152*   Liver Enzymes  Recent Labs Lab 06/07/15 1640 06/08/15 0245 06/08/15 0520  ALBUMIN 2.6* 2.6* 2.6*   Cardiac Enzymes No results for input(s): TROPONINI, PROBNP in the last 168 hours. Glucose  Recent Labs Lab 06/07/15 0814 06/07/15 1236 06/07/15 1626 06/07/15 1953 06/07/15 2329 06/08/15 0524  GLUCAP 123* 129* 140* 128* 148* 180*   Imaging Dg Chest Port 1 View  06/08/2015  CLINICAL DATA:  Intubation. EXAM: PORTABLE CHEST 1 VIEW COMPARISON:  06/07/2015. FINDINGS: Endotracheal tube and NG tube in stable position. Right IJ sheath in stable position. Stable cardiomegaly. Persistent bibasilar infiltrates and or edema noted. Small right pleural effusion. No pneumothorax. IMPRESSION: 1. Lines and tubes in stable position. 2. Persistent bibasilar infiltrates  and/or edema. Small right pleural effusion. 3. Stable cardiomegaly. Electronically Signed   By: Maisie Fus  Register   On: 06/08/2015 07:18   Tests/studies CT abd/pelvis 12/9: Airspace consolidation in both lung bases. Small left pleural effusion. Widespread anasarca.In comparison with the recent prior study, there is less appendiceal dilatation. There is localized periappendiceal fluid without frank abscess. No free air is seen near the appendix. A small amount of free air is noted in the right lower quadrant immediately adjacent to the ascending colon. It is difficult to ascertain whether this focal area of pneumoperitoneum is secondary to recent trocar placement versus appendiceal or viscus perforation. Moderate ascites. Contrast in gallbladder, likely vicarious excretion secondary to recent hypotensive episodes.Persistent hydronephrosis on the right with 6 mm calculus in the proximal right ureter at the level of L4. Small nonobstructing calculus lower pole left kidney. No bowel obstruction. No demonstrable abscess in the abdomen or pelvis.  PCXR that I reviewed myself w/ persistent right base atx. Otherwise improving.   ASSESSMENT / PLAN: 59 y/o male with multiorgan dysfunction syndrome due to mixed cardiogenic shock from intra-operative MI complicated by septic shock from appendicitis without source control. He was on supra-maximal doses multiple vaso-pressor agents, IABP and maximum ventilator support for has combined metabolic and respiratory acidosis. Over the last 72 hrs he has made some progress. Off Pressors. Have dialed down PC and PEEP as he was pulling VTs in the liter range. Ventilator synchrony seems to be a barrier to transitioning to [facility]. Has mild wheeze and prolonged exhale so will add BD.  Ultimately seems a little better. He still has a right hydro, but this is an issue we can eval further when his shock resolves. Started trickle feeds. Family updated.   PULMONARY A:  Hypoxemic and  hypercapnic respiratory failure - despite max vent support. Bibasilar atlelectasis L>R Severely asynchronous with the ventilator. P:   - Decrease FiO2 to 70% and increase PEEP to 12. - F/U ABG. - Repeat PCXR & ABG in AM. - Cont to wean FiO2. - Not ready for weaning yet given O2 demand.  CARDIOVASCULAR L FEM IABP 12/2 >> out  L IJ TLC 12/2 >>  A:  VF Arrest - intraoperatively, s/p LHC with DES to RCA Mixed cardiogenic shock - in the setting of inferior intra-operative MI and septic shock.  IABP now out Torsades / Monomorphic VT, Frequent PVC's - suspect in setting of ischemia  Afib RV Infarct  Theone Murdoch cath removed 12/9 Off pressors on 12/11 P:  - Continue heparin gtt and dual anti-platelet therapy. - Continue Amiodarone (decreased dose as of 12/11) - Milrinone per cards - ASA  - Stress dose steroids  - Cardiology Following  - Vasopressin drip added.  RENAL A:  AGMA/NAGMA: 2/2 oliguric renal failure and lactic acidemia. Hyponatremia  Acute Kidney Injury - in setting of arrest / hypoperfusion  Persistent Right hydronephrosis.  P:   - CVVH per renal. Even balance given hemodynamics. - Monitor UOP / BMP  - Replace electrolytes as indicated  - Suspect he will need perc nephrostomy tube on right. Will d/w cards timing. This will be down the road.   GASTROINTESTINAL A:   Appendicitis - unable to complete surgery for source control.  No evidence of peritonitis or rupture. No abscess formation from f/u CT scan 12/9 P:   - PPI. - No plan for repeat OR per surgery given his critical condition. - Tubefeeds (d/w surgical team)-->cont per protocol.  HEMATOLOGIC A:   Dual Antiplatelet therapy and heparin gtt. P:  - Aggrenox / Plavix - Heparin gtt per pharmacy   INFECTIOUS A:   Septic shock - secondary to appendicitis without source control. P:   BCx2 12/2 >>  ABX: - Vanc 12/2 >>  - Meropenem 12/3 >> - Anidulafungin 12/7 >>  Monitor fever curve /  WBC  ENDOCRINE A:   Relative adrenal insufficiency.   P:   - Hydrocortisone 100mg  IV Q8hours-->will change to q12 now that off pressors  - SSI to resistant scale, CBG Q4  NEUROLOGIC A:   ICU Associated Pain: follows commands  P:   - RASS goal: -2 with vent compliance - Fentanyl gtt for pain - Versed PRN for sedation   FAMILY  - Updates: Sister updated at length bedside.  Plan on family meeting at 10 AM for plan of care, if we are to continue support will need trach.  The patient is critically ill with multiple organ systems failure and requires high complexity decision making for assessment and support, frequent evaluation and titration of therapies, application of advanced monitoring technologies and extensive interpretation of multiple databases.   Critical Care Time devoted to patient care services described in this note is  38  Minutes. This time reflects time of care of this signee Dr Koren Bound. This critical care time does not reflect procedure time, or teaching time or supervisory time of PA/NP/Med student/Med Resident etc but could involve care discussion time.  Alyson Reedy, M.D. [facility] Pulmonary/Critical Care Medicine. Pager: [phone]. After hours pager: [phone].  06/08/2015, 9:57 AM

## 2015-06-08 NOTE — Progress Notes (Signed)
Nutrition Follow-up  DOCUMENTATION CODES:   Obesity unspecified  INTERVENTION:   Continue:  Vital High Protein @ 75 ml/hr Tube feeding regimen provides 1800 kcal (100% of needs), 158 grams of protein, and 1512 ml of H2O.   NUTRITION DIAGNOSIS:   Inadequate oral intake related to inability to eat as evidenced by NPO status. Ongoing.   GOAL:   Provide needs based on ASPEN/SCCM guidelines Met.   MONITOR:   Vent status, Labs, Weight trends, TF tolerance, I & O's  ASSESSMENT:   38M presented to [facility] with several days of progressively worsening RLQ pain found to have an acute appendicitis. He went to the OR and suffered a VT arrest on the table after the insertion of the trochar. He was transferred to [facility] after a code STEMI was called and the surgical intervention was aborted. In the cath lab an RCA DES was placed and an IABP inserted for MI and cardiogenic shock. While in cath lab, he required shock x6 for VF arrest and post return to ICU, shock x5.   Patient is currently intubated on ventilator support MV: 12.8 L/min Temp (24hrs), Avg:98.1 F (36.7 C), Min:97.4 F (36.3 C), Max:99 F (37.2 C)  Medications reviewed and include: solu-cortef, Pressors restarted 12/13 Labs reviewed: sodium (134), phosphorus and potassium WNL Cortrak placed 12/10 tip in duodenum  OG tube to suction remains CVVHD continues for even balance Family meeting planned for Wednesday No bm noted  Diet Order:  Diet NPO time specified  Skin:  Wound (see comment) (Pressure ulcer: Deep tissue injury to buttocks)  Last BM:  12/1  Height:   Ht Readings from Last 1 Encounters:  06/11/2015 5' 10"  (1.778 m)   Weight:   Wt Readings from Last 1 Encounters:  06/08/15 273 lb 13 oz (124.2 kg)   Ideal Body Weight:  75.5 kg  BMI:  Body mass index is 39.29 kg/(m^2).  Estimated Nutritional Needs:   Kcal:  6060-0459  Protein:  >151 grams  Fluid:  1.6-1.8 L  EDUCATION NEEDS:   Education needs  no appropriate at this time  [place], [place], [place] Pager [phone] After Hours Pager

## 2015-06-08 NOTE — Progress Notes (Signed)
ANTIBIOTIC CONSULT NOTE - INITIAL  Pharmacy Consult for Vancomycin  Indication: Gangrenous appendix s/p aborted appendectomy due to cardiac arrest  No Known Allergies  Labs:  Recent Labs  06/06/15 0345  06/07/15 0345 06/07/15 1640 06/08/15 0245  WBC 20.8*  --  25.3*  --  27.4*  HGB 8.1*  --  8.5*  --  8.9*  PLT 164  --  171  --  162  CREATININE 2.23*  < > 2.41* 2.32* 2.42*  2.43*  < > = values in this interval not displayed. Estimated Creatinine Clearance: 43.9 mL/min (by C-G formula based on Cr of 2.42).  Recent Labs  06/08/15 0245  VANCOTROUGH 38*     Assessment: SUPRA-therapeutic vancomycin trough, drawn a few hours early but wouldn't make a difference in this instance, SCr continues to trend upward  Goal of Therapy:  Vancomycin trough level 15-20 mcg/ml  Plan:  -HOLD vancomycin for now -Random vancomycin level 12/14 AM to assess clearance  Scott Holden 06/08/2015,4:43 AM

## 2015-06-08 NOTE — Progress Notes (Signed)
Pt OG tube secretions now a dark red/Vasilia Dise color. Heparin held and Dr. Claudette Head paged. Occult sample collected. Will continue to monitor closely.

## 2015-06-08 NOTE — Procedures (Signed)
Intubation Procedure Note Scott Holden [id number] 1955-07-15  Procedure: Intubation Indications: ETT needed to be changed  Procedure Details Consent: Unable to obtain consent because of emergent medical necessity. Time Out: Verified patient identification, verified procedure, site/side was marked, verified correct patient position, special equipment/implants available, medications/allergies/relevent history reviewed, required imaging and test results available.  Performed  Maximum sterile technique was used including gloves, gown, hand hygiene and mask.  Changed over an exchanger.    Evaluation Hemodynamic Status: BP stable throughout; O2 sats: stable throughout Patient's Current Condition: stable Complications: No apparent complications Patient did tolerate procedure well. Chest X-ray ordered to verify placement.  CXR: pending.   YACOUB,WESAM 06/08/2015

## 2015-06-08 NOTE — Progress Notes (Signed)
Patient ID: Scott Holden, male   DOB: 1955-09-15, 59 y.o.   MRN: [id number]    Advanced Heart Failure Rounding Note   Subjective:    Remains intubated/sedated on CVVHD.   Not pullling much much due to hypotension.  Down 4 lbs again yesterday => CVP remains 11-12 this morning.    Over night he has episodes of hypotension. Levo increased to 17 mcg and vasopressin added. On amio 60 mg per hour and milrinone 0.375 mcg. WBC trending up.  Increased NG output .--->1.8 liters. .   Anuric.     Objective:   Weight Range:  Vital Signs:   Temp:  [97.4 F (36.3 C)-98.3 F (36.8 C)] 98.3 F (36.8 C) (12/13 0400) Pulse Rate:  [77-80] 80 (12/12 1559) Resp:  [0-21] 15 (12/13 0700) BP: (87-107)/(45-57) 89/49 mmHg (12/13 0400) SpO2:  [93 %-99 %] 98 % (12/13 0700) Arterial Line BP: (78-117)/(37-51) 105/50 mmHg (12/13 0700) FiO2 (%):  [80 %-100 %] 80 % (12/13 0600) Weight:  [273 lb 13 oz (124.2 kg)] 273 lb 13 oz (124.2 kg) (12/13 0500) Last BM Date: 05/27/15  Weight change: Filed Weights   06/06/15 0500 06/07/15 0322 06/08/15 0500  Weight: 295 lb 10.2 oz (134.1 kg) 278 lb 10.6 oz (126.4 kg) 273 lb 13 oz (124.2 kg)    Intake/Output:   Intake/Output Summary (Last 24 hours) at 06/08/15 0714 Last data filed at 06/08/15 0700  Gross per 24 hour  Intake 5032.15 ml  Output   8753 ml  Net -3720.85 ml     Physical Exam: CVP 11-12   General:  Intubated sedated.  HEENT: normal x for ETT Neck: supple. JVP difficult (thick neck). Cor: PMI nonpalpable. Regular rate & rhythm. No rubs, gallops or murmurs. Lungs: clear anteriorly with increased WOB Abdomen: Markedly obese. Soft. Hypoactive BS Extremities: Mild edema. HD catheter in R groin. R and LLE 2+ edema.  Neuro: sedated non focal  Telemetry: NSR 70s PVCs.   Labs: Basic Metabolic Panel:  Recent Labs Lab 06/04/15 0310  06/05/15 0500  06/06/15 0345 06/06/15 1615 06/07/15 0345 06/07/15 1640 06/08/15 0245 06/08/15 0520  NA 130*   131*  < > 134*  < > 135 135 136 136 136  135 134*  K 4.5  4.4  < > 4.3  < > 4.2 4.2 4.0 4.5 4.7  4.8 4.9  CL 97*  96*  < > 97*  < > 100* 100* 100* 102 100*  100* 99*  CO2 29  29  < > 24  < > 25 24 24 25 26  26 24   GLUCOSE 149*  150*  < > 143*  < > 133* 135* 138* 145* 165*  174* 190*  BUN 38*  37*  < > 41*  < > 45* 48* 54* 54* 58*  59* 60*  CREATININE 1.81*  1.80*  < > 2.00*  < > 2.23* 2.33* 2.41* 2.32* 2.42*  2.43* 2.44*  CALCIUM 6.7*  6.7*  < > 7.0*  < > 7.9* 8.1* 8.1* 8.2* 8.2*  8.2* 8.1*  MG 2.4  --  2.6*  --  2.7*  --  2.8*  --  2.6*  --   PHOS 4.2  < > 4.3  < > 3.3 3.3 3.7 4.4 4.3  4.2 4.5  < > = values in this interval not displayed.  Liver Function Tests:  Recent Labs Lab 06/06/15 1615 06/07/15 0345 06/07/15 1640 06/08/15 0245 06/08/15 0520  ALBUMIN 2.7* 2.7* 2.6* 2.6* 2.6*  No results for input(s): LIPASE, AMYLASE in the last 168 hours. No results for input(s): AMMONIA in the last 168 hours.  CBC:  Recent Labs Lab 06/03/15 0805 06/04/15 0310 06/05/15 0500 06/06/15 0345 06/07/15 0345 06/08/15 0245  WBC 25.1* 22.2* 23.2* 20.8* 25.3* 27.4*  NEUTROABS 22.5*  --   --   --   --   --   HGB 10.5* 9.9* 8.8* 8.1* 8.5* 8.9*  HCT 30.8* 29.2* 26.0* 23.1* 24.8* 26.3*  MCV 84.8 87.2 85.8 84.6 82.4 83.8  PLT 278 179 175 164 171 162    Cardiac Enzymes: No results for input(s): CKTOTAL, CKMB, CKMBINDEX, TROPONINI in the last 168 hours.  BNP: BNP (last 3 results) No results for input(s): BNP in the last 8760 hours.  ProBNP (last 3 results) No results for input(s): PROBNP in the last 8760 hours.    Other results:  Imaging: Dg Chest Port 1 View  06/07/2015  CLINICAL DATA:  Check endotracheal tube placement EXAM: PORTABLE CHEST - 1 VIEW COMPARISON:  06/07/2015 FINDINGS: Cardiac shadow is mildly enlarged but stable. A nasogastric catheter is noted extending into the stomach. A feeding catheter extends into the stomach as well. An endotracheal tube is  seen 3 cm above the carina. A left jugular central line and right jugular sheath are again seen and stable. Bibasilar changes are noted right greater than left but stable in appearance. No new focal abnormality is seen. IMPRESSION: Tubes and lines as described. Bibasilar changes right greater than left. This is stable from the prior exam. Electronically Signed   By: Alcide Clever M.D.   On: 06/07/2015 07:59   Dg Chest Port 1 View  06/07/2015  CLINICAL DATA:  Acute respiratory failure. EXAM: PORTABLE CHEST 1 VIEW COMPARISON:  06/06/2015. FINDINGS: Endotracheal tube and NG tube in stable position. Right IJ sheath in stable position. Left IJ line in stable position. Persistent low lung volumes with bibasilar atelectasis and/or infiltrates. Cardiomegaly with interim increase in pulmonary vascularity and interstitial markings consistent with mild congestive heart failure. IMPRESSION: 1. Lines and tubes in stable position. 2. Persistent low lung volumes with bibasilar atelectasis and/or infiltrates. 3. Cardiomegaly with new onset mild pulmonary vascular prominence and bilateral interstitial prominence consistent with congestive heart failure. Electronically Signed   By: Maisie Fus  Register   On: 06/07/2015 07:15   Dg Chest Port 1 View  06/06/2015  CLINICAL DATA:  Acute respiratory failure, in tube EXAM: PORTABLE CHEST 1 VIEW COMPARISON:  06/01/2015 FINDINGS: Stable endotracheal and NG tube position. No pulmonary edema. To S1 grounds catheter has been removed. Balloon pump is unchanged in position. There is right IJ sheath in place. Small right pleural effusion with right basilar atelectasis or infiltrate. IMPRESSION: Stable support apparatus. Right Swan-Ganz catheter has been removed. No pulmonary edema. There is small right pleural effusion with right basilar atelectasis or infiltrate. Electronically Signed   By: Natasha Mead M.D.   On: 06/06/2015 14:01     Medications:     Scheduled Medications: .  anidulafungin  100 mg Intravenous Q24H  . antiseptic oral rinse  7 mL Mouth Rinse 10 times per day  . arformoterol  15 mcg Nebulization BID  . aspirin  81 mg Per Tube Daily  . atorvastatin  80 mg Per NG tube q1800  . budesonide (PULMICORT) nebulizer solution  0.5 mg Nebulization BID  . chlorhexidine gluconate  15 mL Mouth Rinse BID  . Chlorhexidine Gluconate Cloth  6 each Topical Q0600  . clopidogrel  75 mg  Per NG tube Daily  . fentaNYL (SUBLIMAZE) injection  50 mcg Intravenous Once  . hydrocortisone sod succinate (SOLU-CORTEF) inj  100 mg Intravenous Q12H  . Influenza vac split quadrivalent PF  0.5 mL Intramuscular Tomorrow-1000  . insulin aspart  0-20 Units Subcutaneous 6 times per day  . meropenem (MERREM) IV  1 g Intravenous 3 times per day  . pantoprazole sodium  40 mg Per Tube Daily  . sodium chloride  10-40 mL Intracatheter Q12H    Infusions: . sodium chloride 0 mL/hr at 06/03/15 1600  . sodium chloride Stopped (06/04/15 1200)  . amiodarone 60 mg/hr (06/08/15 0709)  . feeding supplement (VITAL HIGH PROTEIN) 1,000 mL (06/07/15 1630)  . fentaNYL infusion INTRAVENOUS 250 mcg/hr (06/08/15 0612)  . heparin 950 Units/hr (06/08/15 0126)  . midazolam (VERSED) infusion 6 mg/hr (06/08/15 0612)  . milrinone 0.375 mcg/kg/min (06/08/15 0400)  . norepinephrine (LEVOPHED) Adult infusion 17 mcg/min (06/08/15 0700)  . dialysis replacement fluid (prismasate) 500 mL/hr at 06/08/15 0511  . dialysis replacement fluid (prismasate) 300 mL/hr at 06/06/15 1007  . dialysate (PRISMASATE) 1,000 mL/hr at 06/08/15 0510  . vasopressin (PITRESSIN) infusion - *FOR SHOCK* 0.03 Units/min (06/07/15 2059)    PRN Medications: sodium chloride, fentaNYL, heparin, heparin, midazolam, midazolam, morphine injection, ondansetron **OR** ondansetron (ZOFRAN) IV, simethicone, sodium chloride   Assessment:   1. Acute systolic HF with cardiogenic shock with biventricular dysfunction R>L.  EF 20-25%.  2. Acute  inferior STEMI with RV involvement   --s/p PCI RCA on 12/2 3. Acute respiratory failure 4. AKI now on CVVHD 5. Acute appendicitis being treated medically 6. Recurrent VT/VF. Torsades after Swan pulled 12/9 7. New onset AF - back in NSR on amiodarone gtt 8. Cardiogenic/septic shock  Plan/Discussion:    CVP 12.. Remains on CVVHD .   Remains oliguric.    Cardiogenic + septic shock.  He is on milrinone 0.375 mcg + norepi  17 mcg +  Vasopressin 0.03 unit  CT abdomen 12/10: Localized peri-appendiceal fluid but no frank abscess.  Will continue current abx and fungal coverage.   Continue amio and heparin for AF. Cut back to 30 mg per hour.  On DAPT for recent stent.   Scott Clegg  NP-C   06/08/2015 7:14 AM  Length of Stay: 11   06/08/2015, 7:14 AM  Advanced Heart Failure Team Pager [phone] (M-F; 7a - 4p)  Please contact [facility] for night-coverage after hours (4p -7a ) and weekends on [email or web address]  Patient seen and examined with Tonye Becket, NP. We discussed all aspects of the encounter. I agree with the assessment and plan as stated above.   Continues to struggle. Remains on multiple pressors. Respiratory status very tenuous and unable to wean from vent. On CVVHD and remains anuric. Remains in NSR on IV amio. CVP measured personally. Volume status nearing baseline.  Overall prognosis appears increasingly grim. I spoke with CCM and Renal teams. Is nearing point where he will need PEG and Trach if we are to continue and even with that I am not sure he will have a meaningful outcome. Will continue inotropic support and CVVHD for now pending family discussion tomorrow.   The patient is critically ill with multiple organ systems failure and requires high complexity decision making for assessment and support, frequent evaluation and titration of therapies, application of advanced monitoring technologies and extensive interpretation of multiple databases.   Critical Care Time devoted to  patient care services described in this note is 45 Minutes.  Scott Marter,MD 6:06 PM

## 2015-06-08 NOTE — Procedures (Signed)
Bedside Bronchoscopy Procedure Note Scott Holden [id number] June 17, 1956  Procedure: Bronchoscopy Indications: Diagnostic evaluation of the airways, Obtain specimens for culture and/or other diagnostic studies and Remove secretions  Procedure Details: ET Tube Size: ET Tube secured at lip (cm): Bite block in place: Yes In preparation for procedure, Patient hyper-oxygenated with 100 % FiO2 and Saline given via ETT (40 ml) Airway entered and the following bronchi were examined: RUL, RML, RLL, LUL, LLL and Bronchi.   Bronchoscope removed.    Evaluation BP 102/50 mmHg  Pulse 70  Temp(Src) 98.6 F (37 C) (Oral)  Resp 17  Ht 5\' 10"  (1.778 m)  Wt 273 lb 13 oz (124.2 kg)  BMI 39.29 kg/m2  SpO2 93% Breath Sounds:Rhonch O2 sats: stable throughout Patient's Current Condition: stable Specimens:  Sent Complications: No apparent complications Patient did tolerate procedure well.   Cherylin Mylar 06/08/2015, 4:58 PM

## 2015-06-08 NOTE — Progress Notes (Signed)
   06/08/15 1000  Clinical Encounter Type  Visited With Family  Visit Type Follow-up  Spiritual Encounters  Spiritual Needs Emotional  Stress Factors  Family Stress Factors Exhausted;Loss  CH met family in waiting area and inquired about pt; family meeting on Wednesday at 10:00 to discuss decisions; Per family, [place] will attend meeting; 10:43 AM Gwynn Burly

## 2015-06-09 ENCOUNTER — Inpatient Hospital Stay (HOSPITAL_COMMUNITY): Payer: Medicaid Other

## 2015-06-09 ENCOUNTER — Encounter (HOSPITAL_COMMUNITY): Payer: Medicaid Other

## 2015-06-09 LAB — CBC
HEMATOCRIT: 25.7 % — AB (ref 39.0–52.0)
HEMOGLOBIN: 9.2 g/dL — AB (ref 13.0–17.0)
MCH: 28.9 pg (ref 26.0–34.0)
MCHC: 35.8 g/dL (ref 30.0–36.0)
MCV: 80.8 fL (ref 78.0–100.0)
PLATELETS: 158 10*3/uL (ref 150–400)
RBC: 3.18 MIL/uL — AB (ref 4.22–5.81)
RDW: 14.8 % (ref 11.5–15.5)
WBC: 27.7 10*3/uL — AB (ref 4.0–10.5)

## 2015-06-09 LAB — GLUCOSE, CAPILLARY
GLUCOSE-CAPILLARY: 174 mg/dL — AB (ref 65–99)
Glucose-Capillary: 167 mg/dL — ABNORMAL HIGH (ref 65–99)
Glucose-Capillary: 147 mg/dL — ABNORMAL HIGH (ref 65–99)

## 2015-06-09 LAB — HEPATIC FUNCTION PANEL
ALT: 171 U/L — AB (ref 17–63)
AST: 98 U/L — ABNORMAL HIGH (ref 15–41)
Albumin: 2.3 g/dL — ABNORMAL LOW (ref 3.5–5.0)
Alkaline Phosphatase: 163 U/L — ABNORMAL HIGH (ref 38–126)
BILIRUBIN DIRECT: 1.1 mg/dL — AB (ref 0.1–0.5)
BILIRUBIN INDIRECT: 1.1 mg/dL — AB (ref 0.3–0.9)
Total Bilirubin: 2.2 mg/dL — ABNORMAL HIGH (ref 0.3–1.2)
Total Protein: 6.2 g/dL — ABNORMAL LOW (ref 6.5–8.1)

## 2015-06-09 LAB — RENAL FUNCTION PANEL
ALBUMIN: 2.4 g/dL — AB (ref 3.5–5.0)
Anion gap: 13 (ref 5–15)
BUN: 72 mg/dL — ABNORMAL HIGH (ref 6–20)
CHLORIDE: 99 mmol/L — AB (ref 101–111)
CO2: 21 mmol/L — AB (ref 22–32)
Calcium: 8.3 mg/dL — ABNORMAL LOW (ref 8.9–10.3)
Creatinine, Ser: 2.55 mg/dL — ABNORMAL HIGH (ref 0.61–1.24)
GFR calc Af Amer: 30 mL/min — ABNORMAL LOW (ref >60)
GFR, EST NON AFRICAN AMERICAN: 26 mL/min — AB (ref >60)
Glucose, Bld: 196 mg/dL — ABNORMAL HIGH (ref 65–99)
Phosphorus: 2 mg/dL — ABNORMAL LOW (ref 2.5–4.6)
Potassium: 4.3 mmol/L (ref 3.5–5.1)
SODIUM: 133 mmol/L — AB (ref 135–145)

## 2015-06-09 LAB — VANCOMYCIN, RANDOM: VANCOMYCIN RM: 26 ug/mL

## 2015-06-09 LAB — MAGNESIUM: Magnesium: 2.7 mg/dL — ABNORMAL HIGH (ref 1.7–2.4)

## 2015-06-09 MED ORDER — MORPHINE SULFATE 25 MG/ML IV SOLN
10.0000 mg/h | INTRAVENOUS | Status: DC
  Administered 2015-06-09: 10 mg/h via INTRAVENOUS
  Filled 2015-06-09: qty 10

## 2015-06-09 MED ORDER — MORPHINE BOLUS VIA INFUSION
5.0000 mg | INTRAVENOUS | Status: DC | PRN
  Filled 2015-06-09: qty 20

## 2015-06-09 MED FILL — Medication: Qty: 1 | Status: AC

## 2015-06-09 NOTE — Progress Notes (Signed)
   06/11/2015 1300  Clinical Encounter Type  Visited With Family;Patient and family together  Visit Type Patient actively dying;Critical Care;Psychological support  Referral From Family  Consult/Referral To Chaplain  Spiritual Encounters  Spiritual Needs Emotional;Grief support  Stress Factors  Family Stress Factors Loss  CH with family during the extubation process and in the dying process to offer support, guidance and prayer.  CH will continue to periodically check on family and be present as needed.  1:59 PM Erline Levine

## 2015-06-09 NOTE — Progress Notes (Signed)
Patient ID: Scott Holden, male   DOB: 08/30/1955, 59 y.o.   MRN: [id number]    Advanced Heart Failure Rounding Note   Subjective:    Remains intubated/sedated on CVVHD. Had bronchoscopy yesterday with mucus plugging.  Still on CVVHD. UF limited due to low BP.    Over night he has episodes of hypotension. Levo increased to 17 mcg and vasopressin added. On amio 60 mg per hour and milrinone 0.375 mcg. WBC trending up.  Increased NG output .--->1.8 liters. .   Anuric.    Objective:   Weight Range:  Vital Signs:   Temp:  [97.4 F (36.3 C)-99.5 F (37.5 C)] 98.4 F (36.9 C) (12/14 0800) Pulse Rate:  [70-89] 80 (12/14 0726) Resp:  [15-17] 15 (12/14 0900) BP: (72-131)/(47-69) 116/67 mmHg (12/14 0800) SpO2:  [92 %-100 %] 99 % (12/14 0900) Arterial Line BP: (65-146)/(37-66) 127/60 mmHg (12/14 0900) FiO2 (%):  [50 %-100 %] 50 % (12/14 0800) Weight:  [119.9 kg (264 lb 5.3 oz)] 119.9 kg (264 lb 5.3 oz) (12/14 0200) Last BM Date: 05/27/15  Weight change: Filed Weights   06/07/15 0322 06/08/15 0500 07/03/2015 0200  Weight: 126.4 kg (278 lb 10.6 oz) 124.2 kg (273 lb 13 oz) 119.9 kg (264 lb 5.3 oz)    Intake/Output:   Intake/Output Summary (Last 24 hours) at 03-Jul-2015 0952 Last data filed at July 03, 2015 0900  Gross per 24 hour  Intake 5016.09 ml  Output   5982 ml  Net -965.91 ml     Physical Exam: CVP 11-12   General:  Intubated sedated.  HEENT: normal x for ETT Neck: supple. JVP difficult (thick neck). Cor: PMI nonpalpable. Regular rate & rhythm. No rubs, gallops or murmurs. Lungs: clear anteriorly with increased WOB Abdomen: Markedly obese. Soft. Hypoactive BS Extremities: Mild edema. HD catheter in R groin. R and LLE 2+ edema.  Neuro: sedated non focal  Telemetry: NSR 70s PVCs.   Labs: Basic Metabolic Panel:  Recent Labs Lab 06/05/15 0500  06/06/15 0345  06/07/15 0345 06/07/15 1640 06/08/15 0245 06/08/15 0520 2015/07/03 0500  NA 134*  < > 135  < > 136 136 136   135 134* 133*  K 4.3  < > 4.2  < > 4.0 4.5 4.7  4.8 4.9 4.3  CL 97*  < > 100*  < > 100* 102 100*  100* 99* 99*  CO2 24  < > 25  < > 24 25 26  26 24  21*  GLUCOSE 143*  < > 133*  < > 138* 145* 165*  174* 190* 196*  BUN 41*  < > 45*  < > 54* 54* 58*  59* 60* 72*  CREATININE 2.00*  < > 2.23*  < > 2.41* 2.32* 2.42*  2.43* 2.44* 2.55*  CALCIUM 7.0*  < > 7.9*  < > 8.1* 8.2* 8.2*  8.2* 8.1* 8.3*  MG 2.6*  --  2.7*  --  2.8*  --  2.6*  --  2.7*  PHOS 4.3  < > 3.3  < > 3.7 4.4 4.3  4.2 4.5 2.0*  < > = values in this interval not displayed.  Liver Function Tests:  Recent Labs Lab 06/07/15 0345 06/07/15 1640 06/08/15 0245 06/08/15 0520 2015-07-03 0500  AST  --   --   --   --  98*  ALT  --   --   --   --  171*  ALKPHOS  --   --   --   --  163*  BILITOT  --   --   --   --  2.2*  PROT  --   --   --   --  6.2*  ALBUMIN 2.7* 2.6* 2.6* 2.6* 2.3*  2.4*   No results for input(s): LIPASE, AMYLASE in the last 168 hours. No results for input(s): AMMONIA in the last 168 hours.  CBC:  Recent Labs Lab 06/03/15 0805  06/06/15 0345 06/07/15 0345 06/08/15 0245 06/08/15 1345 05/29/2015 0820  WBC 25.1*  < > 20.8* 25.3* 27.4* 26.4* 27.7*  NEUTROABS 22.5*  --   --   --   --   --   --   HGB 10.5*  < > 8.1* 8.5* 8.9* 8.6* 9.2*  HCT 30.8*  < > 23.1* 24.8* 26.3* 25.4* 25.7*  MCV 84.8  < > 84.6 82.4 83.8 83.3 80.8  PLT 278  < > 164 171 162 156 158  < > = values in this interval not displayed.  Cardiac Enzymes: No results for input(s): CKTOTAL, CKMB, CKMBINDEX, TROPONINI in the last 168 hours.  BNP: BNP (last 3 results) No results for input(s): BNP in the last 8760 hours.  ProBNP (last 3 results) No results for input(s): PROBNP in the last 8760 hours.    Other results:  Imaging: Dg Chest Port 1 View  06/18/2015  CLINICAL DATA:  Pulmonary edema, shortness of breath, acute appendicitis, acute MI with cardiogenic shock, pulmonary sarcoidosis. EXAM: PORTABLE CHEST 1 VIEW COMPARISON:   Portable chest x-ray of June 08, 2015 FINDINGS: The lungs are well-expanded. The interstitial markings are coarse in the left mid and lower lung and at the right lung base. The right hemidiaphragm is better demonstrated today. The heart is normal in size. The pulmonary vascularity is not engorged. The endotracheal tube tip lies 5.6 cm above the carina. The esophagogastric tube tip projects below the inferior margin of the image. An external pacemaker defibrillator pad is present. IMPRESSION: Interval improvement in the appearance of the right lung base consistent with resolving atelectasis and/or small right pleural effusion. There is no CHF. The support tubes are in reasonable position. Electronically Signed   By: David  [place] M.D.   On: 06/16/2015 07:15   Dg Chest Port 1 View  06/08/2015  CLINICAL DATA:  Intubation. EXAM: PORTABLE CHEST 1 VIEW COMPARISON:  06/07/2015. FINDINGS: Endotracheal tube and NG tube in stable position. Right IJ sheath in stable position. Stable cardiomegaly. Persistent bibasilar infiltrates and or edema noted. Small right pleural effusion. No pneumothorax. IMPRESSION: 1. Lines and tubes in stable position. 2. Persistent bibasilar infiltrates and/or edema. Small right pleural effusion. 3. Stable cardiomegaly. Electronically Signed   By: Maisie Fus  Register   On: 06/08/2015 07:18     Medications:     Scheduled Medications: . anidulafungin  100 mg Intravenous Q24H  . antiseptic oral rinse  7 mL Mouth Rinse 10 times per day  . arformoterol  15 mcg Nebulization BID  . aspirin  81 mg Per Tube Daily  . atorvastatin  80 mg Per NG tube q1800  . budesonide (PULMICORT) nebulizer solution  0.5 mg Nebulization BID  . chlorhexidine gluconate  15 mL Mouth Rinse BID  . Chlorhexidine Gluconate Cloth  6 each Topical Q0600  . clopidogrel  75 mg Per NG tube Daily  . fentaNYL (SUBLIMAZE) injection  50 mcg Intravenous Once  . hydrocortisone sod succinate (SOLU-CORTEF) inj  100 mg  Intravenous Q12H  . Influenza vac split quadrivalent PF  0.5 mL Intramuscular Tomorrow-1000  .  insulin aspart  0-20 Units Subcutaneous 6 times per day  . meropenem (MERREM) IV  1 g Intravenous 3 times per day  . pantoprazole sodium  40 mg Per Tube Daily  . sodium chloride  10-40 mL Intracatheter Q12H    Infusions: . sodium chloride 0 mL/hr at 06/03/15 1600  . sodium chloride Stopped (06/04/15 1200)  . amiodarone 60 mg/hr (06/08/2015 0800)  . feeding supplement (VITAL HIGH PROTEIN) 1,000 mL (06/23/2015 0800)  . fentaNYL infusion INTRAVENOUS 100 mcg/hr (06/12/2015 0932)  . heparin Stopped (06/08/15 1200)  . midazolam (VERSED) infusion 2 mg/hr (06/14/2015 0800)  . milrinone 0.375 mcg/kg/min (06/26/2015 0832)  . norepinephrine (LEVOPHED) Adult infusion 18 mcg/min (06/11/2015 0900)  . dialysis replacement fluid (prismasate) 500 mL/hr at 06/24/2015 0100  . dialysis replacement fluid (prismasate) 300 mL/hr at 06/03/2015 0656  . dialysate (PRISMASATE) 1,000 mL/hr at 06/03/2015 0551  . vasopressin (PITRESSIN) infusion - *FOR SHOCK* 0.03 Units/min (06/12/2015 0800)    PRN Medications: sodium chloride, fentaNYL, heparin, heparin, midazolam, midazolam, morphine injection, ondansetron **OR** ondansetron (ZOFRAN) IV, simethicone, sodium chloride   Assessment:   1. Acute systolic HF with cardiogenic shock with biventricular dysfunction R>L.  EF 20-25%.  2. Acute inferior STEMI with RV involvement   --s/p PCI RCA on 12/2 3. Acute respiratory failure 4. AKI now on CVVHD 5. Acute appendicitis being treated medically 6. Recurrent VT/VF. Torsades after Swan pulled 12/9 7. New onset AF - back in NSR on amiodarone gtt 8. Cardiogenic/septic shock  Plan/Discussion:    CVP 12.. Remains on CVVHD .   Remains oliguric.    Cardiogenic + septic shock.  He is on milrinone 0.375 mcg + norepi  17 mcg +  Vasopressin 0.03 unit. Continue abx and anti-fungal regimen.  Continue amio and heparin for AF. Cut back to 30 mg per  hour.  On DAPT for recent stent.   [name]  NP-C   06/01/2015 9:52 AM  Length of Stay: 12   06/07/2015, 9:52 AM  Advanced Heart Failure Team Pager [phone] (M-F; 7a - 4p)  Please contact [facility] for night-coverage after hours (4p -7a ) and weekends on [email or web address]  Patient seen and examined with Tonye Becket, NP. We discussed all aspects of the encounter. I agree with the assessment and plan as stated above.   Continues to struggle. Remains on multiple pressors. Respiratory status somewhat improved after bronch. On CVVHD and remains anuric. Weight close to baseline now. Remains in NSR on IV amio.  Overall prognosis remains quite poor.  I spoke with CCM again and he is clearly nearing point where he will need PEG and Trach if we are to continue and even with that I am not sure he will have a meaningful outcome. Will continue inotropic support and CVVHD for now pending family discussion later this am.   The patient is critically ill with multiple organ systems failure and requires high complexity decision making for assessment and support, frequent evaluation and titration of therapies, application of advanced monitoring technologies and extensive interpretation of multiple databases.   Critical Care Time devoted to patient care services described in this note is 45 Minutes.  Dhalia Zingaro,MD 9:52 AM

## 2015-06-09 NOTE — Progress Notes (Signed)
No art-line set up changed per protocol. Per RN pt is Expired.

## 2015-06-09 NOTE — Progress Notes (Signed)
Heparin drip was turned off during day shift per MD, called pharmacy, told pharmacist Clydie Braun heparin is off, discontinued heparin level for this morning. Will report to next RN.

## 2015-06-09 NOTE — Progress Notes (Signed)
Pharmacy Antibiotic Follow-up Note  Scott Holden is a 58 y.o. year-old male admitted on 06/21/2015.  The patient is currently on day 11 of vancomycin and meropenem for sepsis due to gangrenous appendix s/p aborted appendectomy d/t cardiac arrest. He is also on day 7 of anidulafungin.  Assessment/Plan: This patient's current antibiotics are being continued without adjustments. BAL done yesterday. His wbc count continues to rise, would consider recheck CT per surgery recommendations and consider de-escalation soon if negative for abscess.  Temp (24hrs), Avg:98.7 F (37.1 C), Min:97.4 F (36.3 C), Max:99.5 F (37.5 C)   Recent Labs Lab 06/05/15 0500 06/06/15 0345 06/07/15 0345 06/08/15 0245 06/08/15 1345  WBC 23.2* 20.8* 25.3* 27.4* 26.4*     Recent Labs Lab 06/07/15 0345 06/07/15 1640 06/08/15 0245 06/08/15 0520 06/03/2015 0500  CREATININE 2.41* 2.32* 2.42*  2.43* 2.44* 2.55*   Estimated Creatinine Clearance: 40.5 mL/min (by C-G formula based on Cr of 2.55).    No Known Allergies  Antimicrobials this admission: Vancomycin 12/3 >>  Meropenem 12/3 >>  Anidulafungin 12/7 >>  Levels/dose changes this admission: 12/8 VT: 14 on 1500 mg IV q24h (CRRT dosing) 12/13 VT: 38 on 1500 q24h>>hold dosing 12/14 VR 26>>continue to hold and recheck in am  Microbiology results: 12/13 BAL - rare yeast MRSA PCR: positive  Thank you for allowing pharmacy to be a part of this patient's care.  Sheppard Coil PharmD., BCPS Clinical Pharmacist Pager [phone] 05/30/2015 10:00 AM

## 2015-06-09 NOTE — Progress Notes (Signed)
12 Days Post-Op  Subjective: Remains intubated, on pressors High NG output  Objective: Vital signs in last 24 hours: Temp:  [97.4 F (36.3 C)-99.5 F (37.5 C)] 99 F (37.2 C) (12/14 0400) Pulse Rate:  [70-89] 80 (12/14 0726) Resp:  [15-17] 15 (12/14 0726) BP: (72-131)/(47-69) 131/67 mmHg (12/14 0400) SpO2:  [92 %-100 %] 99 % (12/14 0726) Arterial Line BP: (65-146)/(37-66) 136/63 mmHg (12/14 0700) FiO2 (%):  [50 %-100 %] 50 % (12/14 0726) Weight:  [119.9 kg (264 lb 5.3 oz)] 119.9 kg (264 lb 5.3 oz) (12/14 0200) Last BM Date: 05/27/15  Intake/Output from previous day: 12/13 0701 - 12/14 0700 In: 5222.3 [I.V.:2782.3; NG/GT:1980; IV Piggyback:460] Out: 6557 [Emesis/NG output:1700] Intake/Output this shift:    Abdomen soft with some guarding in the RLQ  Lab Results:   Recent Labs  06/08/15 0245 06/08/15 1345  WBC 27.4* 26.4*  HGB 8.9* 8.6*  HCT 26.3* 25.4*  PLT 162 156   BMET  Recent Labs  06/08/15 0520 05/30/2015 0500  NA 134* 133*  K 4.9 4.3  CL 99* 99*  CO2 24 21*  GLUCOSE 190* 196*  BUN 60* 72*  CREATININE 2.44* 2.55*  CALCIUM 8.1* 8.3*   PT/INR No results for input(s): LABPROT, INR in the last 72 hours. ABG  Recent Labs  06/07/15 2103 06/08/15 0345  PHART 7.299* 7.349*  HCO3 23.9 21.9    Studies/Results: Dg Chest Port 1 View  06/01/2015  CLINICAL DATA:  Pulmonary edema, shortness of breath, acute appendicitis, acute MI with cardiogenic shock, pulmonary sarcoidosis. EXAM: PORTABLE CHEST 1 VIEW COMPARISON:  Portable chest x-ray of June 08, 2015 FINDINGS: The lungs are well-expanded. The interstitial markings are coarse in the left mid and lower lung and at the right lung base. The right hemidiaphragm is better demonstrated today. The heart is normal in size. The pulmonary vascularity is not engorged. The endotracheal tube tip lies 5.6 cm above the carina. The esophagogastric tube tip projects below the inferior margin of the image. An external  pacemaker defibrillator pad is present. IMPRESSION: Interval improvement in the appearance of the right lung base consistent with resolving atelectasis and/or small right pleural effusion. There is no CHF. The support tubes are in reasonable position. Electronically Signed   By: David  [place] M.D.   On: 06/14/2015 07:15   Dg Chest Port 1 View  06/08/2015  CLINICAL DATA:  Intubation. EXAM: PORTABLE CHEST 1 VIEW COMPARISON:  06/07/2015. FINDINGS: Endotracheal tube and NG tube in stable position. Right IJ sheath in stable position. Stable cardiomegaly. Persistent bibasilar infiltrates and or edema noted. Small right pleural effusion. No pneumothorax. IMPRESSION: 1. Lines and tubes in stable position. 2. Persistent bibasilar infiltrates and/or edema. Small right pleural effusion. 3. Stable cardiomegaly. Electronically Signed   By: Maisie Fus  Register   On: 06/08/2015 07:18    Anti-infectives: Anti-infectives    Start     Dose/Rate Route Frequency Ordered Stop   06/03/15 0800  anidulafungin (ERAXIS) 100 mg in sodium chloride 0.9 % 100 mL IVPB     100 mg over 90 Minutes Intravenous Every 24 hours 06/02/15 0730     06/03/15 0600  vancomycin (VANCOCIN) 1,500 mg in sodium chloride 0.9 % 500 mL IVPB  Status:  Discontinued     1,500 mg 250 mL/hr over 120 Minutes Intravenous Every 24 hours 06/02/15 0807 06/08/15 0442   06/02/15 1600  meropenem (MERREM) 1 g in sodium chloride 0.9 % 100 mL IVPB  Status:  Discontinued  1 g 200 mL/hr over 30 Minutes Intravenous Every 12 hours 06/02/15 0449 06/02/15 0921   06/02/15 1400  meropenem (MERREM) 1 g in sodium chloride 0.9 % 100 mL IVPB     1 g 200 mL/hr over 30 Minutes Intravenous 3 times per day 06/02/15 0921     06/02/15 0815  vancomycin (VANCOCIN) 500 mg in sodium chloride 0.9 % 100 mL IVPB     500 mg 100 mL/hr over 60 Minutes Intravenous NOW 06/02/15 0807 06/02/15 1020   06/02/15 0800  anidulafungin (ERAXIS) 200 mg in sodium chloride 0.9 % 200 mL IVPB     200  mg over 180 Minutes Intravenous  Once 06/02/15 0730 06/02/15 1139   05/30/15 0600  vancomycin (VANCOCIN) 1,250 mg in sodium chloride 0.9 % 250 mL IVPB  Status:  Discontinued     1,250 mg 166.7 mL/hr over 90 Minutes Intravenous Every 24 hours 05/29/15 0237 06/02/15 0807   05/29/15 1200  meropenem (MERREM) 1 g in sodium chloride 0.9 % 100 mL IVPB  Status:  Discontinued     1 g 200 mL/hr over 30 Minutes Intravenous Every 8 hours 05/29/15 0237 06/02/15 0449   05/29/15 0600  piperacillin-tazobactam (ZOSYN) IVPB 3.375 g  Status:  Discontinued     3.375 g 12.5 mL/hr over 240 Minutes Intravenous 3 times per day 2015/06/21 2256 05/29/15 0216   05/29/15 0245  vancomycin (VANCOCIN) 2,500 mg in sodium chloride 0.9 % 500 mL IVPB     2,500 mg 250 mL/hr over 120 Minutes Intravenous  Once 05/29/15 0237 05/29/15 0612   05/29/15 0245  meropenem (MERREM) 1 g in sodium chloride 0.9 % 100 mL IVPB     1 g 200 mL/hr over 30 Minutes Intravenous  Once 05/29/15 0237 05/29/15 0339   21-Jun-2015 2315  piperacillin-tazobactam (ZOSYN) IVPB 3.375 g     3.375 g 100 mL/hr over 30 Minutes Intravenous  Once June 21, 2015 2300 05/29/15 0124   06-21-15 0830  cefTRIAXone (ROCEPHIN) 2 g in dextrose 5 % 50 mL IVPB  Status:  Discontinued     2 g 100 mL/hr over 30 Minutes Intravenous Every 24 hours 06/21/2015 0815 21-Jun-2015 2256   June 21, 2015 0830  metroNIDAZOLE (FLAGYL) IVPB 500 mg  Status:  Discontinued     500 mg 100 mL/hr over 60 Minutes Intravenous Every 8 hours June 21, 2015 0815 06-21-2015 2256   Jun 21, 2015 0715  piperacillin-tazobactam (ZOSYN) IVPB 3.375 g     3.375 g 12.5 mL/hr over 240 Minutes Intravenous  Once 06-21-2015 0700 June 21, 2015 0907      Assessment/Plan: s/p Procedure(s): Left Heart Cath (N/A) IABP Insertion (N/A) Temporary Pacemaker (N/A)  Appendicitis - stable on antibiotics.  WBC still up.  Today's is pending.  If continues to stay up, would get a repeat CT to r/o abscess, but I don't think he will be stable enough to go to  the scanner.   LOS: 12 days    Octavia Velador A 05/29/2015

## 2015-06-09 NOTE — Progress Notes (Signed)
Decision of family to transition to comfort measures noted.  Have discontinued CVVHD and will sign off.  Please call with any questions or concerns.

## 2015-06-09 NOTE — Progress Notes (Signed)
eLink Physician-Brief Progress Note Patient Name: Scott Holden DOB: [birth year] MRN: [id number]   Date of Service  07-05-15  HPI/Events of Note  Patient is now comfort care and extubated to room air. Asked by Respiratory Therapy to D/C Pulmacort and Brovana.  eICU Interventions  Will D/C Pulmacort and Brovana.     Intervention Category Minor Interventions: Routine modifications to care plan (e.g. PRN medications for pain, fever)  Mega Kinkade Eugene July 05, 2015, 5:22 PM

## 2015-06-09 NOTE — Progress Notes (Signed)
Patient ID: Scott Holden, male   DOB: Oct 29, 1955, 59 y.o.   MRN: [id number] S:able to wean levophed somewhat this am, CVP 10, FIO2 50% O:BP 131/67 mmHg  Pulse 80  Temp(Src) 99 F (37.2 C) (Axillary)  Resp 15  Ht  (1.778 m)  Wt 119.9 kg (264 lb 5.3 oz)  BMI 37.93 kg/m2  SpO2 99%  Intake/Output Summary (Last 24 hours) at 06/07/2015 0738 Last data filed at 06/25/2015 0700  Gross per 24 hour  Intake 5222.27 ml  Output   6557 ml  Net -1334.73 ml   Intake/Output: I/O last 3 completed shifts: In: 7991.3 [I.V.:4451.3; NG/GT:2880; IV Piggyback:660] Out: 40981 [Emesis/NG output:2600; Other:8619]  Intake/Output this shift:    Weight change: -4.3 kg (-9 lb 7.7 oz) Gen:WD obese WM critically ill, intubated XBJ:YNWGN HS Resp:scattered rhonchi FAO:ZHYQ, hypoactive bowel sounds Ext:1+ edema   Recent Labs Lab 06/06/15 0345 06/06/15 1615 06/07/15 0345 06/07/15 1640 06/08/15 0245 06/08/15 0520 05/29/2015 0500  NA 135 135 136 136 136  135 134* 133*  K 4.2 4.2 4.0 4.5 4.7  4.8 4.9 4.3  CL 100* 100* 100* 102 100*  100* 99* 99*  CO2 21*  GLUCOSE 133* 135* 138* 145* 165*  174* 190* 196*  BUN 45* 48* 54* 54* 58*  59* 60* 72*  CREATININE 2.23* 2.33* 2.41* 2.32* 2.42*  2.43* 2.44* 2.55*  ALBUMIN 2.9* 2.7* 2.7* 2.6* 2.6* 2.6* 2.3*  2.4*  CALCIUM 7.9* 8.1* 8.1* 8.2* 8.2*  8.2* 8.1* 8.3*  PHOS 3.3 3.3 3.7 4.4 4.3  4.2 4.5 2.0*  AST  --   --   --   --   --   --  98*  ALT  --   --   --   --   --   --  171*   Liver Function Tests:  Recent Labs Lab 06/08/15 0245 06/08/15 0520 06/22/2015 0500  AST  --   --  98*  ALT  --   --  171*  ALKPHOS  --   --  163*  BILITOT  --   --  2.2*  PROT  --   --  6.2*  ALBUMIN 2.6* 2.6* 2.3*  2.4*   No results for input(s): LIPASE, AMYLASE in the last 168 hours. No results for input(s): AMMONIA in the last 168 hours. CBC:  Recent Labs Lab 06/03/15 0805  06/05/15 0500 06/06/15 0345 06/07/15 0345 06/08/15 0245  06/08/15 1345  WBC 25.1*  < > 23.2* 20.8* 25.3* 27.4* 26.4*  NEUTROABS 22.5*  --   --   --   --   --   --   HGB 10.5*  < > 8.8* 8.1* 8.5* 8.9* 8.6*  HCT 30.8*  < > 26.0* 23.1* 24.8* 26.3* 25.4*  MCV 84.8  < > 85.8 84.6 82.4 83.8 83.3  PLT 278  < > 175 164 171 162 156  < > = values in this interval not displayed. Cardiac Enzymes: No results for input(s): CKTOTAL, CKMB, CKMBINDEX, TROPONINI in the last 168 hours. CBG:  Recent Labs Lab 06/08/15 1141 06/08/15 1545 06/08/15 1923 06/08/15 2318 06/04/2015 0355  GLUCAP 113* 200* 182* 159* 167*    Iron Studies: No results for input(s): IRON, TIBC, TRANSFERRIN, FERRITIN in the last 72 hours. Studies/Results: Dg Chest Port 1 View  05/28/2015  CLINICAL DATA:  Pulmonary edema, shortness of breath, acute appendicitis, acute MI with cardiogenic shock, pulmonary sarcoidosis. EXAM: PORTABLE CHEST 1 VIEW  COMPARISON:  Portable chest x-ray of June 08, 2015 FINDINGS: The lungs are well-expanded. The interstitial markings are coarse in the left mid and lower lung and at the right lung base. The right hemidiaphragm is better demonstrated today. The heart is normal in size. The pulmonary vascularity is not engorged. The endotracheal tube tip lies 5.6 cm above the carina. The esophagogastric tube tip projects below the inferior margin of the image. An external pacemaker defibrillator pad is present. IMPRESSION: Interval improvement in the appearance of the right lung base consistent with resolving atelectasis and/or small right pleural effusion. There is no CHF. The support tubes are in reasonable position. Electronically Signed   By: David  [place] M.D.   On: 07/05/15 07:15   Dg Chest Port 1 View  06/08/2015  CLINICAL DATA:  Intubation. EXAM: PORTABLE CHEST 1 VIEW COMPARISON:  06/07/2015. FINDINGS: Endotracheal tube and NG tube in stable position. Right IJ sheath in stable position. Stable cardiomegaly. Persistent bibasilar infiltrates and or edema noted.  Small right pleural effusion. No pneumothorax. IMPRESSION: 1. Lines and tubes in stable position. 2. Persistent bibasilar infiltrates and/or edema. Small right pleural effusion. 3. Stable cardiomegaly. Electronically Signed   By: Maisie Fus  Register   On: 06/08/2015 07:18   Dg Chest Port 1 View  06/07/2015  CLINICAL DATA:  Check endotracheal tube placement EXAM: PORTABLE CHEST - 1 VIEW COMPARISON:  06/07/2015 FINDINGS: Cardiac shadow is mildly enlarged but stable. A nasogastric catheter is noted extending into the stomach. A feeding catheter extends into the stomach as well. An endotracheal tube is seen 3 cm above the carina. A left jugular central line and right jugular sheath are again seen and stable. Bibasilar changes are noted right greater than left but stable in appearance. No new focal abnormality is seen. IMPRESSION: Tubes and lines as described. Bibasilar changes right greater than left. This is stable from the prior exam. Electronically Signed   By: Alcide Clever M.D.   On: 06/07/2015 07:59   . anidulafungin  100 mg Intravenous Q24H  . antiseptic oral rinse  7 mL Mouth Rinse 10 times per day  . arformoterol  15 mcg Nebulization BID  . aspirin  81 mg Per Tube Daily  . atorvastatin  80 mg Per NG tube q1800  . budesonide (PULMICORT) nebulizer solution  0.5 mg Nebulization BID  . chlorhexidine gluconate  15 mL Mouth Rinse BID  . Chlorhexidine Gluconate Cloth  6 each Topical Q0600  . clopidogrel  75 mg Per NG tube Daily  . fentaNYL (SUBLIMAZE) injection  50 mcg Intravenous Once  . hydrocortisone sod succinate (SOLU-CORTEF) inj  100 mg Intravenous Q12H  . Influenza vac split quadrivalent PF  0.5 mL Intramuscular Tomorrow-1000  . insulin aspart  0-20 Units Subcutaneous 6 times per day  . meropenem (MERREM) IV  1 g Intravenous 3 times per day  . pantoprazole sodium  40 mg Per Tube Daily  . sodium chloride  10-40 mL Intracatheter Q12H    BMET    Component Value Date/Time   NA 133* 2015/07/05  0500   NA 140 01/15/2014 0910   K 4.3 07/05/15 0500   CL 99* 07/05/2015 0500   CO2 21* 07-05-15 0500   GLUCOSE 196* 05-Jul-2015 0500   GLUCOSE 121* 01/15/2014 0910   BUN 72* July 05, 2015 0500   BUN 9 01/15/2014 0910   CREATININE 2.55* Jul 05, 2015 0500   CALCIUM 8.3* 07/05/15 0500   GFRNONAA 26* 2015/07/05 0500   GFRAA 30* July 05, 2015 0500   CBC  Component Value Date/Time   WBC 26.4* 06/08/2015 1345   WBC 11.3* 01/15/2014 0910   RBC 3.05* 06/08/2015 1345   RBC 4.86 01/15/2014 0910   HGB 8.6* 06/08/2015 1345   HCT 25.4* 06/08/2015 1345   PLT 156 06/08/2015 1345   MCV 83.3 06/08/2015 1345   MCH 28.2 06/08/2015 1345   MCH 29.6 01/15/2014 0910   MCHC 33.9 06/08/2015 1345   MCHC 33.3 01/15/2014 0910   RDW 15.5 06/08/2015 1345   RDW 15.2 01/15/2014 0910   LYMPHSABS 1.3 06/03/2015 0805   LYMPHSABS 3.2* 01/15/2014 0910   MONOABS 1.3* 06/03/2015 0805   EOSABS 0.0 06/03/2015 0805   EOSABS 0.3 01/15/2014 0910   BASOSABS 0.0 06/03/2015 0805   BASOSABS 0.0 01/15/2014 0910     Assessment/Plan: 1. ARF, non-oliguric in setting of cardiogenic shock/sepsis and IV contrast as well as bilateral hydronephrosis and obstructing stone in right ureter. He is requiring pressors and despite being non-oliguric he is unable to keep up with the volume administered due to pressors/meds and was markedly volume overloaded. Discussed case with Dr. Gala Romney and CVVHD initiated on 06/01/15 due to hypoxemia and pulmonary edema. 1. Not tolerating CVVHD with UF as well over the last 48 hours requiring increased pressors, and unable to pull fluid.  2. Will continue to keep even for now. 3. Appreciate Dr. Prescott Gum assistance with dialysis access 4. Await decision from family meeting later this am.  Overall prognosis poor as we still cannot address appendicitis and nephrolithiasis with ureteral obstruction. 2. Acute inferior STEMI with RV involvement- s/p PTCA and DES placement in RCA 12/2  complicated by cardiogenic shock requiring an IABP and pressors. IABP removed 06/03/15 and taper pressors as able. 3. Acute appendicitis with gangrenous changes but unable to remove due to his cardiac arrest and critical illness. Continue with antibiotics and pressors/supportive care 4. VDRF with increased oxygen requirements and marked volume overload. After initiation of CVVHDF able to wean FIO2 somewhat. He was stacking breaths yesterday but is more comfortable today. plan per PCCM. 5. SIRS/multi-organ failure- cardiogenic and sepsis. On increased pressors 6. Acute systolic heart failure with cardiogenic shock and biventricular failure. Inadequate response from lasix gtt and metolazone/diuril. 1. Continue with CVVHDF for now 7. Recurrent VT- per Cardiology 8. Hypocalcemia- improved with CVVHD  9. HEME- on heparin gtt as well as angiomax 10. Nephrolithiasis with obstructive uropathy- too unstable for any procedure at this time. Continue with supportive measures 11. Disposition- overall prognosis is grim due to multiple issues which cannot be addressed due to his critical condition. Despite maximal support over the last week, his condition continues to deteriorate and will have family meeting today to discuss goals of care.  Given his deterioration, recommend transition to comfort measures as meaningful recovery seems unlikely.  Othella Slappey A

## 2015-06-09 NOTE — Progress Notes (Signed)
   06/22/2015 1000  Clinical Encounter Type  Visited With Family  Visit Type Patient actively dying;Critical Care;Follow-up  Referral From Family  Consult/Referral To Chaplain  Spiritual Encounters  Spiritual Needs Emotional;Grief support  CH requested to be at family decision meeting with MD; [facility] present offering grief, emotional and spiritual support; CH available as needed and will follow-up today. Erline Levine 10:40 AM

## 2015-06-09 NOTE — Progress Notes (Signed)
PULMONARY / CRITICAL CARE MEDICINE   Name: Scott Holden MRN: [id number] DOB: [birth year]    ADMISSION DATE:  06/19/2015 CONSULTATION DATE:  06/24/2015   REFERRING MD :  Dr. Clifton James  CHIEF COMPLAINT:  Cardiogenic and septic shock  INITIAL PRESENTATION:  60M presented to [facility] with several days of progressively worsening RLQ pain found to have an acute appendicitis.  He went to the OR and suffered a VT arrest on the table after the insertion of the trochar.  He was transferred to [facility] after a code STEMI was called and the surgical intervention was aborted.  In the cath lab an RCA DES was placed and an IABP inserted for MI and cardiogenic shock.  While in cath lab, he required shock x6 for VF arrest and post return to ICU, shock x5.    SUBJECTIVE:  Sedated on vent; very asynchronous with the vent this AM.  VITAL SIGNS: Temp:  [98.4 F (36.9 C)-99.5 F (37.5 C)] 98.4 F (36.9 C) (12/14 0800) Pulse Rate:  [70-89] 82 (12/14 1132) Resp:  [9-17] 15 (12/14 1132) BP: (72-131)/(47-69) 116/67 mmHg (12/14 0800) SpO2:  [93 %-100 %] 99 % (12/14 1132) Arterial Line BP: (65-161)/(37-73) 161/73 mmHg (12/14 1000) FiO2 (%):  [50 %-100 %] 50 % (12/14 1132) Weight:  [119.9 kg (264 lb 5.3 oz)] 119.9 kg (264 lb 5.3 oz) (12/14 0200)   HEMODYNAMICS: CVP:  [7 mmHg-13 mmHg] 13 mmHg   VENTILATOR SETTINGS: Vent Mode:  [-] PSV FiO2 (%):  [50 %-100 %] 50 % Set Rate:  [15 bmp] 15 bmp PEEP:  [12 cmH20] 12 cmH20 Plateau Pressure:  [29 cmH20-32 cmH20] 31 cmH20   INTAKE / OUTPUT:  Intake/Output Summary (Last 24 hours) at 06/07/2015 1206 Last data filed at 06/16/2015 1000  Gross per 24 hour  Intake 4649.78 ml  Output   4934 ml  Net -284.22 ml   PHYSICAL EXAMINATION: General:  Obese male, critically ill Neuro:  Sedated, follows commands on WUA HEENT:  ETT tube in place, moist mucous membranes Cardiovascular:  Regular rate and rhythm  Lungs:  Bilateral rhonchi, some asynchrony. Prolonged exhale w/ faint  wheeze.  Abdomen:  Soft, tender, hypoactive.  Musculoskeletal:   Normal tone, no acute deformities  Skin:  Generalized edema/anasarca   LABS:  CBC  Recent Labs Lab 06/08/15 0245 06/08/15 1345 06/11/2015 0820  WBC 27.4* 26.4* 27.7*  HGB 8.9* 8.6* 9.2*  HCT 26.3* 25.4* 25.7*  PLT 162 156 158   Coag's No results for input(s): APTT, INR in the last 168 hours.   BMET  Recent Labs Lab 06/08/15 0245 06/08/15 0520 06/15/2015 0500  NA 136  135 134* 133*  K 4.7  4.8 4.9 4.3  CL 100*  100* 99* 99*  CO2 21*  BUN 58*  59* 60* 72*  CREATININE 2.42*  2.43* 2.44* 2.55*  GLUCOSE 165*  174* 190* 196*   Electrolytes  Recent Labs Lab 06/07/15 0345  06/08/15 0245 06/08/15 0520 06/16/2015 0500  CALCIUM 8.1*  < > 8.2*  8.2* 8.1* 8.3*  MG 2.8*  --  2.6*  --  2.7*  PHOS 3.7  < > 4.3  4.2 4.5 2.0*  < > = values in this interval not displayed. Sepsis Markers No results for input(s): LATICACIDVEN, PROCALCITON, O2SATVEN in the last 168 hours. ABG  Recent Labs Lab 06/07/15 1240 06/07/15 2103 06/08/15 0345  PHART 7.322* 7.299* 7.349*  PCO2ART 52.0* 50.2* 40.7  PO2ART 124.0* 119* 152*   Liver  Enzymes  Recent Labs Lab 06/08/15 0245 06/08/15 0520 06/27/2015 0500  AST  --   --  98*  ALT  --   --  171*  ALKPHOS  --   --  163*  BILITOT  --   --  2.2*  ALBUMIN 2.6* 2.6* 2.3*  2.4*   Cardiac Enzymes No results for input(s): TROPONINI, PROBNP in the last 168 hours. Glucose  Recent Labs Lab 06/08/15 0759 06/08/15 1141 06/08/15 1545 06/08/15 1923 06/08/15 2318 June 27, 2015 0355  GLUCAP 163* 113* 200* 182* 159* 167*   Imaging Dg Chest Port 1 View  06-27-2015  CLINICAL DATA:  Pulmonary edema, shortness of breath, acute appendicitis, acute MI with cardiogenic shock, pulmonary sarcoidosis. EXAM: PORTABLE CHEST 1 VIEW COMPARISON:  Portable chest x-ray of June 08, 2015 FINDINGS: The lungs are well-expanded. The interstitial markings are coarse in the left mid  and lower lung and at the right lung base. The right hemidiaphragm is better demonstrated today. The heart is normal in size. The pulmonary vascularity is not engorged. The endotracheal tube tip lies 5.6 cm above the carina. The esophagogastric tube tip projects below the inferior margin of the image. An external pacemaker defibrillator pad is present. IMPRESSION: Interval improvement in the appearance of the right lung base consistent with resolving atelectasis and/or small right pleural effusion. There is no CHF. The support tubes are in reasonable position. Electronically Signed   By: David  [place] M.D.   On: 06-27-2015 07:15   Tests/studies CT abd/pelvis 12/9: Airspace consolidation in both lung bases. Small left pleural effusion. Widespread anasarca.In comparison with the recent prior study, there is less appendiceal dilatation. There is localized periappendiceal fluid without frank abscess. No free air is seen near the appendix. A small amount of free air is noted in the right lower quadrant immediately adjacent to the ascending colon. It is difficult to ascertain whether this focal area of pneumoperitoneum is secondary to recent trocar placement versus appendiceal or viscus perforation. Moderate ascites. Contrast in gallbladder, likely vicarious excretion secondary to recent hypotensive episodes.Persistent hydronephrosis on the right with 6 mm calculus in the proximal right ureter at the level of L4. Small nonobstructing calculus lower pole left kidney. No bowel obstruction. No demonstrable abscess in the abdomen or pelvis.  PCXR that I reviewed myself w/ persistent right base atx. Otherwise improving.   ASSESSMENT / PLAN: 59 y/o male with multiorgan dysfunction syndrome due to mixed cardiogenic shock from intra-operative MI complicated by septic shock from appendicitis without source control. He was on supra-maximal doses multiple vaso-pressor agents, IABP and maximum ventilator support for has  combined metabolic and respiratory acidosis. Over the last 72 hrs he has made some progress. Off Pressors. Have dialed down PC and PEEP as he was pulling VTs in the liter range. Ventilator synchrony seems to be a barrier to transitioning to [facility]. Has mild wheeze and prolonged exhale so will add BD.  Ultimately seems a little better. He still has a right hydro, but this is an issue we can eval further when his shock resolves. Started trickle feeds. Family updated.   PULMONARY A:  Hypoxemic and hypercapnic respiratory failure - despite max vent support. Bibasilar atlelectasis L>R Severely asynchronous with the ventilator. P:   - Terminal extubation today.  CARDIOVASCULAR L FEM IABP 12/2 >> out  L IJ TLC 12/2 >>  A:  VF Arrest - intraoperatively, s/p LHC with DES to RCA Mixed cardiogenic shock - in the setting of inferior intra-operative MI and septic shock.  IABP now out Torsades / Monomorphic VT, Frequent PVC's - suspect in setting of ischemia  Afib RV Infarct  Theone Murdoch cath removed 12/9 Off pressors on 12/11 P:  - D/C pressors.  RENAL A:   AGMA/NAGMA: 2/2 oliguric renal failure and lactic acidemia. Hyponatremia  Acute Kidney Injury - in setting of arrest / hypoperfusion  Persistent Right hydronephrosis.  P:   - No further blood draws.  GASTROINTESTINAL A:   Appendicitis - unable to complete surgery for source control.  No evidence of peritonitis or rupture. No abscess formation from f/u CT scan 12/9 P:   - D/C TF.  HEMATOLOGIC A:   Dual Antiplatelet therapy and heparin gtt. P:  - D/C blood draws  INFECTIOUS A:   Septic shock - secondary to appendicitis without source control. P:   BCx2 12/2 >>  ABX: - D/C abx.  Monitor fever curve / WBC  ENDOCRINE A:   Relative adrenal insufficiency.   P:   - D/C hydrocortisone.  NEUROLOGIC A:   ICU Associated Pain: follows commands  P:   - Morphine drip for comfort.  FAMILY  - Updates: Spoke with the family at  length, after discussion decision was made to make patient a full DNR and proceed with comfort care.  The patient is critically ill with multiple organ systems failure and requires high complexity decision making for assessment and support, frequent evaluation and titration of therapies, application of advanced monitoring technologies and extensive interpretation of multiple databases.   Critical Care Time devoted to patient care services described in this note is  38  Minutes. This time reflects time of care of this signee Dr Koren Bound. This critical care time does not reflect procedure time, or teaching time or supervisory time of PA/NP/Med student/Med Resident etc but could involve care discussion time.  Alyson Reedy, M.D. [facility] Pulmonary/Critical Care Medicine. Pager: [phone]. After hours pager: [phone].  03-Jul-2015, 12:06 PM

## 2015-06-09 NOTE — Progress Notes (Signed)
Patient ID: Scott Holden, male   DOB: 06-Jan-1956, 59 y.o.   MRN: [id number]    Advanced Heart Failure Rounding Note   Subjective:    Remains intubated/sedated on CVVHD.   Not pullling much much due to hypotension.  Down 4 lbs again yesterday => CVP remains 11-12 this morning.    Yesterday bronchoscopy with mucus plugging throughout bronchial tree.   Over night Levo increased to 24 mcg and vasopressin added. On amio 60 mg per hour and milrinone 0.375 mcg.  NG output .--->1.8 liters.  Anuric.     Objective:   Weight Range:  Vital Signs:   Temp:  [97.4 F (36.3 C)-99.5 F (37.5 C)] 99 F (37.2 C) (12/14 0400) Pulse Rate:  [70-89] 80 (12/14 0338) Resp:  [15-22] 16 (12/14 0700) BP: (72-131)/(47-69) 131/67 mmHg (12/14 0400) SpO2:  [92 %-100 %] 99 % (12/14 0700) Arterial Line BP: (65-146)/(37-66) 136/63 mmHg (12/14 0700) FiO2 (%):  [50 %-100 %] 50 % (12/14 0400) Weight:  [264 lb 5.3 oz (119.9 kg)] 264 lb 5.3 oz (119.9 kg) (12/14 0200) Last BM Date: 05/27/15  Weight change: Filed Weights   06/07/15 0322 06/08/15 0500 06/06/2015 0200  Weight: 278 lb 10.6 oz (126.4 kg) 273 lb 13 oz (124.2 kg) 264 lb 5.3 oz (119.9 kg)    Intake/Output:   Intake/Output Summary (Last 24 hours) at 06/22/2015 0710 Last data filed at 06/17/2015 0700  Gross per 24 hour  Intake 5222.27 ml  Output   6557 ml  Net -1334.73 ml     Physical Exam: CVP 11 General:  Intubated sedated.  HEENT: normal x for ETT Neck: supple. JVP difficult (thick neck). Cor: PMI nonpalpable. Regular rate & rhythm. No rubs, gallops or murmurs. Lungs: clear anteriorly with increased WOB Abdomen: Markedly obese. Soft. Hypoactive BS Extremities: Mild edema. HD catheter in R groin. R and LLE 2+ edema.  Neuro: sedated non focal  Telemetry: NSR 80s  PVCs.   Labs: Basic Metabolic Panel:  Recent Labs Lab 06/05/15 0500  06/06/15 0345  06/07/15 0345 06/07/15 1640 06/08/15 0245 06/08/15 0520 06/11/2015 0500  NA 134*  < >  135  < > 136 136 136  135 134* 133*  K 4.3  < > 4.2  < > 4.0 4.5 4.7  4.8 4.9 4.3  CL 97*  < > 100*  < > 100* 102 100*  100* 99* 99*  CO2 24  < > 25  < > 21*  GLUCOSE 143*  < > 133*  < > 138* 145* 165*  174* 190* 196*  BUN 41*  < > 45*  < > 54* 54* 58*  59* 60* 72*  CREATININE 2.00*  < > 2.23*  < > 2.41* 2.32* 2.42*  2.43* 2.44* 2.55*  CALCIUM 7.0*  < > 7.9*  < > 8.1* 8.2* 8.2*  8.2* 8.1* 8.3*  MG 2.6*  --  2.7*  --  2.8*  --  2.6*  --  2.7*  PHOS 4.3  < > 3.3  < > 3.7 4.4 4.3  4.2 4.5 2.0*  < > = values in this interval not displayed.  Liver Function Tests:  Recent Labs Lab 06/07/15 0345 06/07/15 1640 06/08/15 0245 06/08/15 0520 05/31/2015 0500  AST  --   --   --   --  98*  ALT  --   --   --   --  171*  ALKPHOS  --   --   --   --  163*  BILITOT  --   --   --   --  2.2*  PROT  --   --   --   --  6.2*  ALBUMIN 2.7* 2.6* 2.6* 2.6* 2.3*  2.4*   No results for input(s): LIPASE, AMYLASE in the last 168 hours. No results for input(s): AMMONIA in the last 168 hours.  CBC:  Recent Labs Lab 06/03/15 0805  06/05/15 0500 06/06/15 0345 06/07/15 0345 06/08/15 0245 06/08/15 1345  WBC 25.1*  < > 23.2* 20.8* 25.3* 27.4* 26.4*  NEUTROABS 22.5*  --   --   --   --   --   --   HGB 10.5*  < > 8.8* 8.1* 8.5* 8.9* 8.6*  HCT 30.8*  < > 26.0* 23.1* 24.8* 26.3* 25.4*  MCV 84.8  < > 85.8 84.6 82.4 83.8 83.3  PLT 278  < > 175 164 171 162 156  < > = values in this interval not displayed.  Cardiac Enzymes: No results for input(s): CKTOTAL, CKMB, CKMBINDEX, TROPONINI in the last 168 hours.  BNP: BNP (last 3 results) No results for input(s): BNP in the last 8760 hours.  ProBNP (last 3 results) No results for input(s): PROBNP in the last 8760 hours.    Other results:  Imaging: Dg Chest Port 1 View  06/08/2015  CLINICAL DATA:  Intubation. EXAM: PORTABLE CHEST 1 VIEW COMPARISON:  06/07/2015. FINDINGS: Endotracheal tube and NG tube in stable position. Right IJ  sheath in stable position. Stable cardiomegaly. Persistent bibasilar infiltrates and or edema noted. Small right pleural effusion. No pneumothorax. IMPRESSION: 1. Lines and tubes in stable position. 2. Persistent bibasilar infiltrates and/or edema. Small right pleural effusion. 3. Stable cardiomegaly. Electronically Signed   By: Maisie Fus  Register   On: 06/08/2015 07:18   Dg Chest Port 1 View  06/07/2015  CLINICAL DATA:  Check endotracheal tube placement EXAM: PORTABLE CHEST - 1 VIEW COMPARISON:  06/07/2015 FINDINGS: Cardiac shadow is mildly enlarged but stable. A nasogastric catheter is noted extending into the stomach. A feeding catheter extends into the stomach as well. An endotracheal tube is seen 3 cm above the carina. A left jugular central line and right jugular sheath are again seen and stable. Bibasilar changes are noted right greater than left but stable in appearance. No new focal abnormality is seen. IMPRESSION: Tubes and lines as described. Bibasilar changes right greater than left. This is stable from the prior exam. Electronically Signed   By: Alcide Clever M.D.   On: 06/07/2015 07:59     Medications:     Scheduled Medications: . anidulafungin  100 mg Intravenous Q24H  . antiseptic oral rinse  7 mL Mouth Rinse 10 times per day  . arformoterol  15 mcg Nebulization BID  . aspirin  81 mg Per Tube Daily  . atorvastatin  80 mg Per NG tube q1800  . budesonide (PULMICORT) nebulizer solution  0.5 mg Nebulization BID  . chlorhexidine gluconate  15 mL Mouth Rinse BID  . Chlorhexidine Gluconate Cloth  6 each Topical Q0600  . clopidogrel  75 mg Per NG tube Daily  . fentaNYL (SUBLIMAZE) injection  50 mcg Intravenous Once  . hydrocortisone sod succinate (SOLU-CORTEF) inj  100 mg Intravenous Q12H  . Influenza vac split quadrivalent PF  0.5 mL Intramuscular Tomorrow-1000  . insulin aspart  0-20 Units Subcutaneous 6 times per day  . meropenem (MERREM) IV  1 g Intravenous 3 times per day  .  pantoprazole sodium  40  mg Per Tube Daily  . sodium chloride  10-40 mL Intracatheter Q12H    Infusions: . sodium chloride 0 mL/hr at 06/03/15 1600  . sodium chloride Stopped (06/04/15 1200)  . amiodarone 60 mg/hr (22-Jun-2015 0200)  . feeding supplement (VITAL HIGH PROTEIN) 1,000 mL (06/08/15 2300)  . fentaNYL infusion INTRAVENOUS 200 mcg/hr (06/08/15 2000)  . heparin Stopped (06/08/15 1200)  . midazolam (VERSED) infusion 4 mg/hr (06/22/15 0000)  . milrinone 0.375 mcg/kg/min (22-Jun-2015 0256)  . norepinephrine (LEVOPHED) Adult infusion 24 mcg/min (06/08/15 2104)  . dialysis replacement fluid (prismasate) 500 mL/hr at 22-Jun-2015 0100  . dialysis replacement fluid (prismasate) 300 mL/hr at 06-22-2015 0656  . dialysate (PRISMASATE) 1,000 mL/hr at 06/22/15 0551  . vasopressin (PITRESSIN) infusion - *FOR SHOCK* 0.03 Units/min (06/08/15 2000)    PRN Medications: sodium chloride, fentaNYL, heparin, heparin, midazolam, midazolam, morphine injection, ondansetron **OR** ondansetron (ZOFRAN) IV, simethicone, sodium chloride   Assessment:   1. Acute systolic HF with cardiogenic shock with biventricular dysfunction R>L.  EF 20-25%.  2. Acute inferior STEMI with RV involvement   --s/p PCI RCA on 12/2 3. Acute respiratory failure 4. AKI now on CVVHD 5. Acute appendicitis being treated medically 6. Recurrent VT/VF. Torsades after Swan pulled 12/9 7. New onset AF - back in NSR on amiodarone gtt 8. Cardiogenic/septic shock  Plan/Discussion:    CVP 11.. Remains on CVVHD .   Remains oliguric.    Cardiogenic + septic shock.  He is on milrinone 0.375 mcg + norepi  24 mcg +  Vasopressin 0.03 unit. Should be able to come down on norepi.   CT abdomen 12/10: Localized peri-appendiceal fluid but no frank abscess.  Will continue current abx and fungal coverage.   Continue amio  AF. Off heparin due to bloody output from OG .  On DAPT for recent stent.   Family meeting to day at 1000 to discuss goals of  care.   Amy Clegg  NP-C   2015/06/22 7:10 AM  Length of Stay: 12   06/22/2015, 7:10 AM  Advanced Heart Failure Team Pager [phone] (M-F; 7a - 4p)  Please contact [facility] for night-coverage after hours (4p -7a ) and weekends on [email or web address]  Patient seen and examined with Tonye Becket, NP. We discussed all aspects of the encounter. I agree with the assessment and plan as stated above.   Agree with above.  He remains critically ill. Will continue full support as described above. I discussed with CCM and there will be a family meeting today to discuss goals of care. Will likely move toward comfort care. Otherwise will need trach/PEG.   Bensimhon, Daniel,MD 5:28 PM

## 2015-06-09 NOTE — Procedures (Signed)
Extubation Procedure Note  Patient Details:   Name: Scott Holden DOB: 1955/10/20 MRN: [id number]   Airway Documentation:     Evaluation  O2 sats: transiently fell during during procedure Complications: No apparent complications Patient did tolerate procedure well. Bilateral Breath Sounds: Rhonchi Suctioning: Oral, Airway No  This was a terminal extubation   Gretel Cantu, Duane Lope 06/16/2015, 1:27 PM

## 2015-06-11 ENCOUNTER — Telehealth: Payer: Self-pay

## 2015-06-11 LAB — CULTURE, BAL-QUANTITATIVE W GRAM STAIN
Colony Count: >=100000
Special Requests: NORMAL

## 2015-06-11 LAB — CULTURE, BAL-QUANTITATIVE

## 2015-06-11 MED FILL — Tirofiban HCl in NaCl 0.9% IV Soln 12.5 MG/250ML (Base Eq): INTRAVENOUS | Qty: 250 | Status: AC

## 2015-06-11 MED FILL — Bivalirudin For IV Soln 250 MG: INTRAVENOUS | Qty: 250 | Status: AC

## 2015-06-11 NOTE — Telephone Encounter (Signed)
On 07-04-15 I received a death certificate from [facility]. The death certificate is for burial. The patient is a patient of Doctor Molli Knock. The death certificate will be taken to [facility]) this am for signature. On 2015-07-04 I received the death certificate back from Doctor Molli Knock. I got the death certificate ready and called the funeral home to let them know the death certificate is ready for pickup.

## 2015-06-23 LAB — LEGIONELLA CULTURE: Special Requests: NORMAL

## 2015-06-27 NOTE — Progress Notes (Signed)
Patient noted asystole on the monitor. No Heart tone auscultated for 2 minutes, no respiration noted. Verified with Rocco Pauls RN.

## 2015-06-27 NOTE — Progress Notes (Signed)
Morphine IV 50cc wasted in the sink. Witnessed by Kavin Leech, RN

## 2015-06-27 DEATH — deceased

## 2015-07-05 LAB — FUNGUS CULTURE W SMEAR
Fungal Smear: NONE SEEN
SPECIAL REQUESTS: NORMAL

## 2015-07-21 LAB — AFB CULTURE WITH SMEAR (NOT AT ARMC)
ACID FAST SMEAR: NONE SEEN
Special Requests: NORMAL

## 2015-07-28 NOTE — Discharge Summary (Signed)
Pt admitted after cardiac arrest/acute MI which occurred  During operative procedure for acute appendicitis. Emergent cardiac cath and RCA was occluded. PCI performed. Pt was in cardiogenic shock and managed by the CHF service and PCCM services. See computer record for all details. Pt expired on 15-Jun-2015 from cardiogenic shock after terminal extubation per family and pt wishes.   MCALHANY,CHRISTOPHER 06/30/2015 1:49 PM

## 2016-12-18 IMAGING — CR DG CHEST 1V PORT
1 series · 1 of 1 positions shown · non-contrast
Comparison: Chest radiograph dated 12/02/2014

CLINICAL DATA: 58-year-old male with central line placement

EXAM:
PORTABLE CHEST 1 VIEW

[AP]
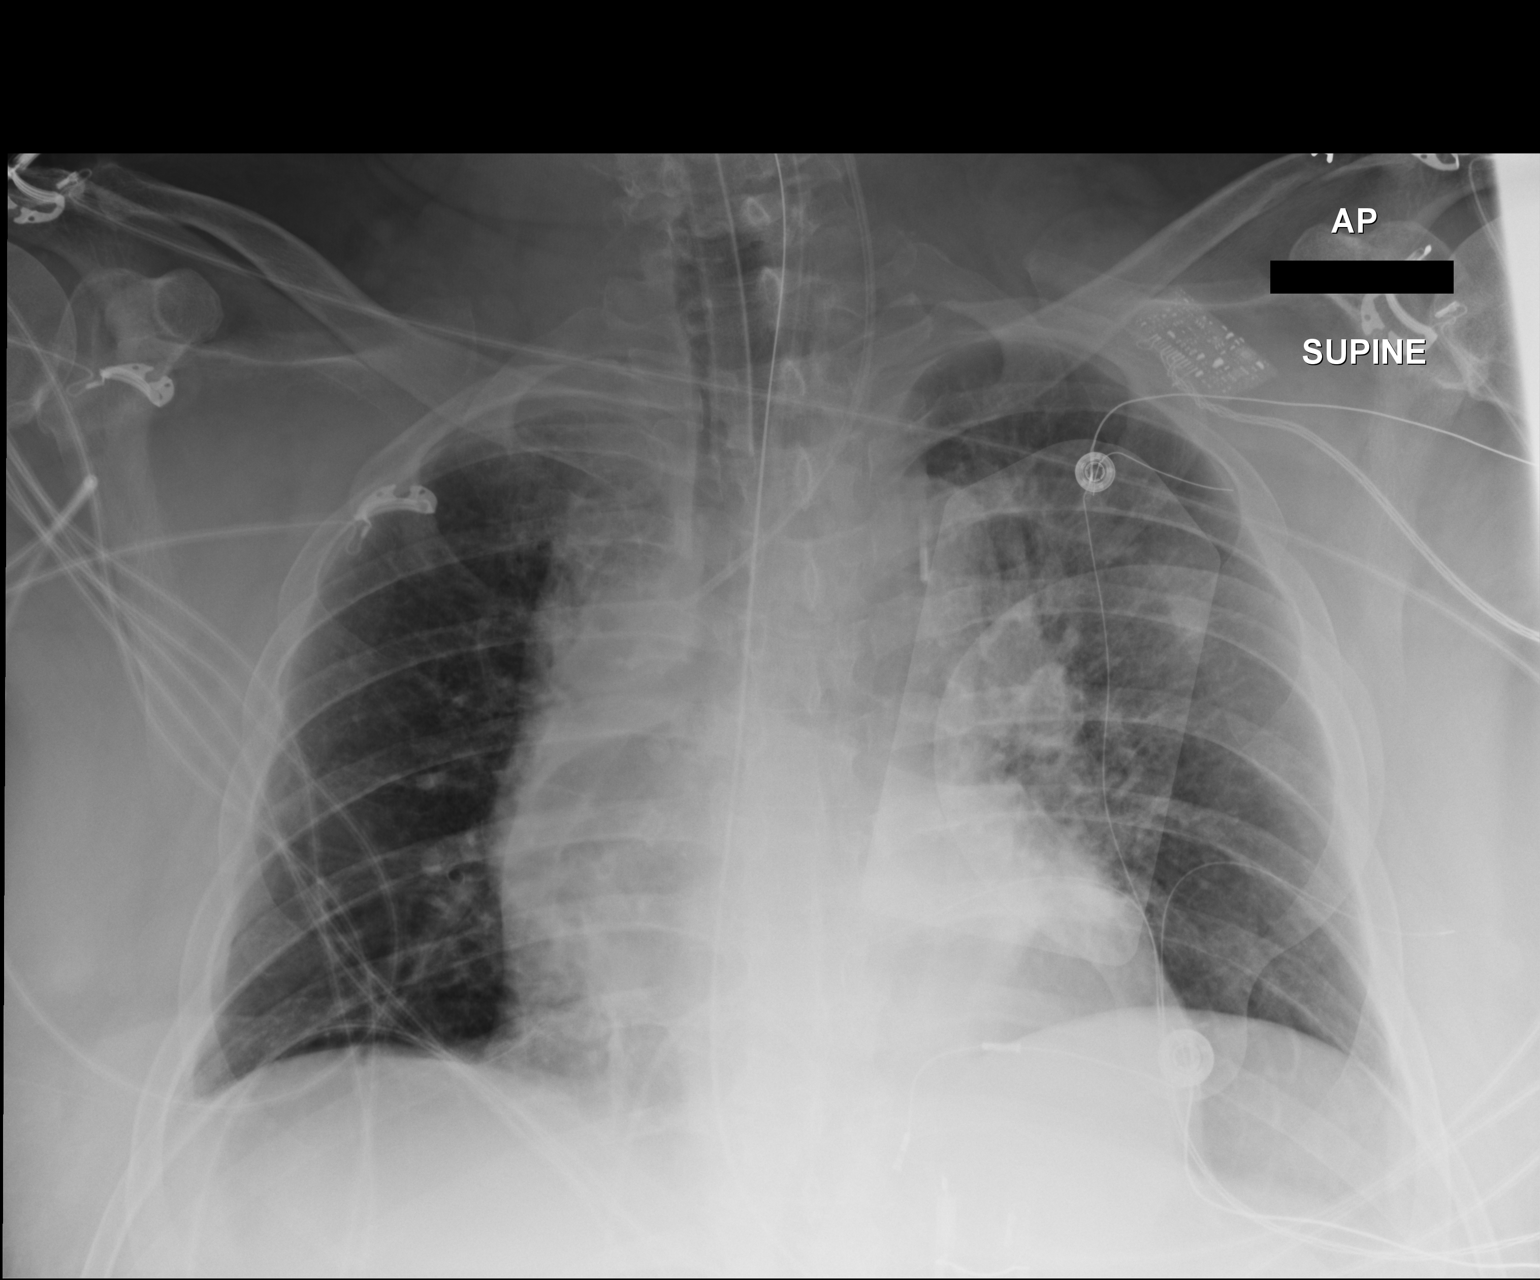

[1 of 1 positions shown; findings below may reference images not displayed]

FINDINGS: An endotracheal tube is noted with tip approximately 7 cm above the
carina. An enteric tube is seen coursing over the mediastinum which
she beyond the image cut off. A left IJ central line noted with tip
over central SVC.

Single-view of the chest demonstrate clear lungs. There is no
pneumothorax. There has been interval widening of the mediastinal
silhouette compared to the prior study. A traumatic vascular injury
with hemorrhage into the mediastinum is not excluded. CT is
recommended for further evaluation. The osseous structures are
grossly unremarkable.
IMPRESSION: Left IJ central line with tip over central SVC.  No pneumothorax.

Interval widening of the mediastinum concerning for traumatic
vascular injury. CT is recommended for further evaluation.

Critical Value/emergent results were called by telephone at the time
of interpretation on 05/29/2015 at [DATE] to nurse [[email or web address]], who
verbally acknowledged these results.

## 2016-12-19 IMAGING — CR DG CHEST 1V PORT
1 series · 1 of 1 positions shown · non-contrast
Comparison: Chest radiograph from earlier today.

CLINICAL DATA: Central line placed

EXAM:
PORTABLE CHEST 1 VIEW

[AP]
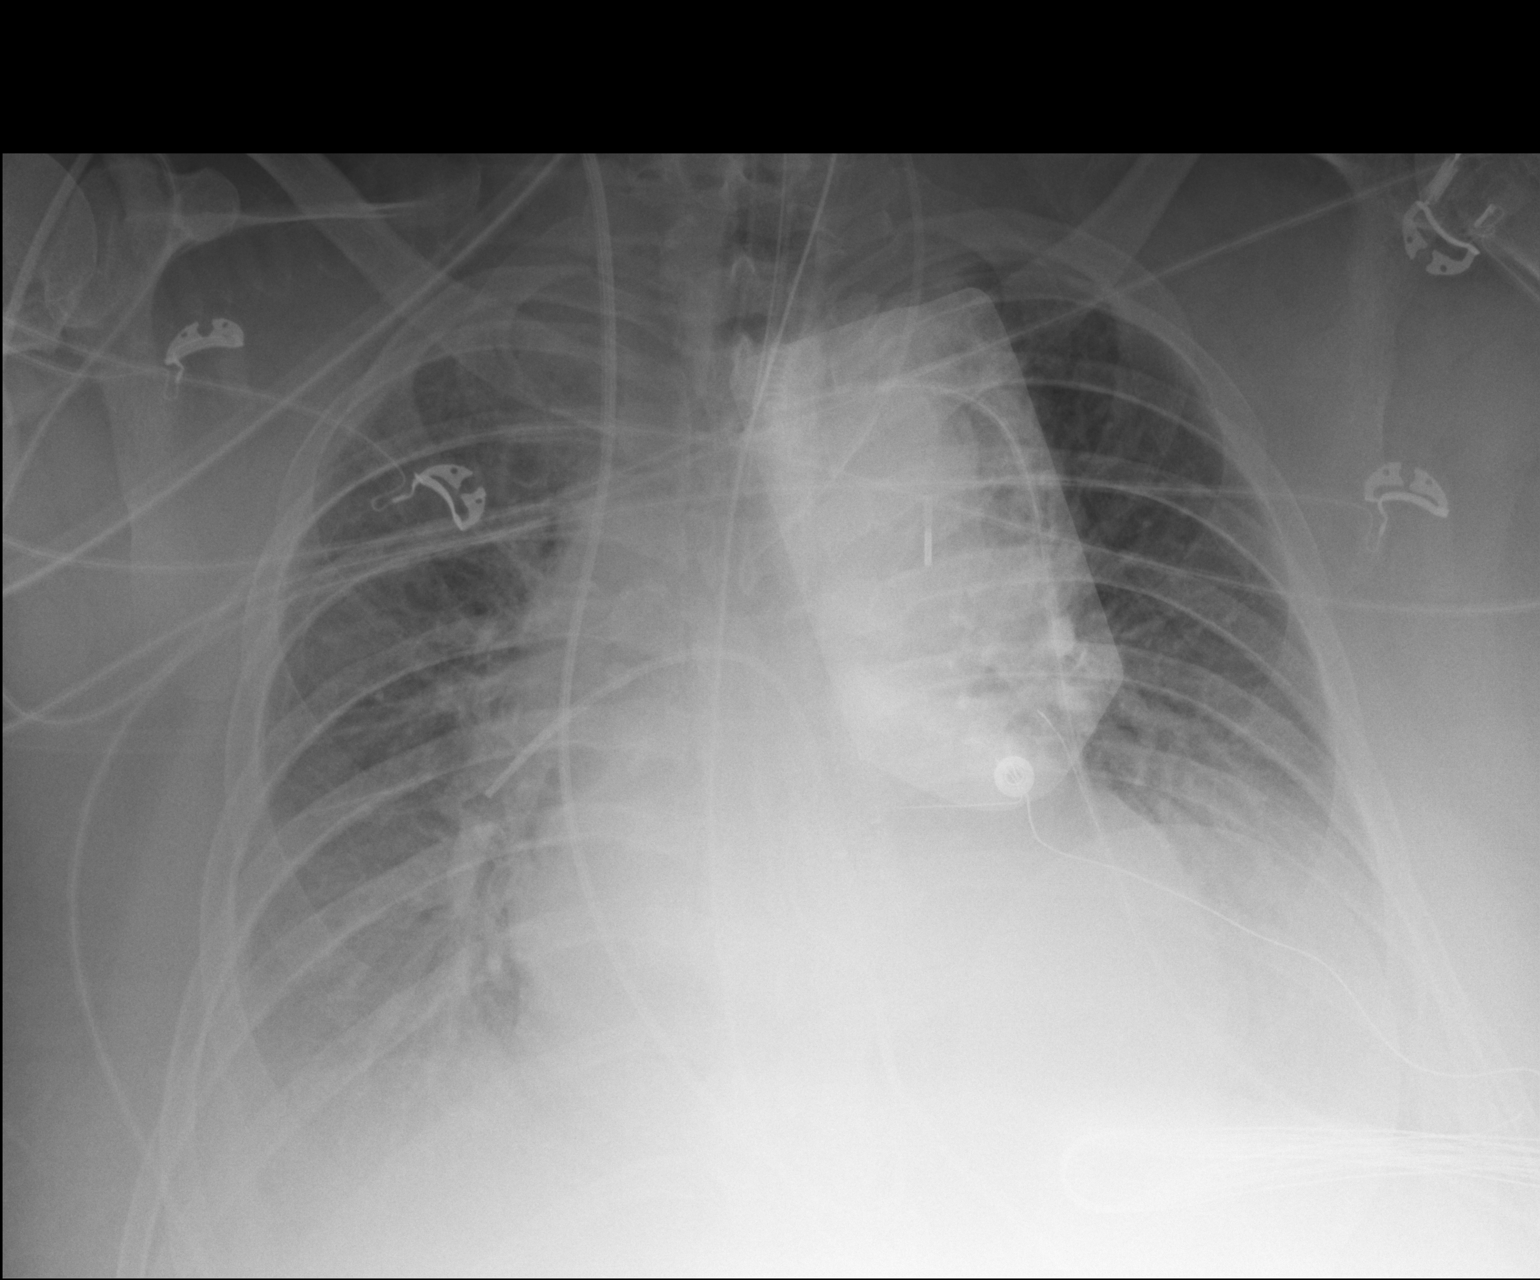

[1 of 1 positions shown; findings below may reference images not displayed]

FINDINGS: Endotracheal tube tip is 5.3 cm above the carina. Left internal
jugular central venous catheter terminates over the left
brachiocephalic vein. Enteric tube enters the lower thoracic
esophagus, with the tip below the inferior margin of this image.
Right internal jugular Swan-Ganz catheter terminates in the bronchus
intermedius branch of the right pulmonary artery. Intra-aortic
balloon pump overlies the proximal descending thoracic aorta at the
T5 level. Stable cardiomediastinal silhouette with mild
cardiomegaly. No pneumothorax. Worsening small bilateral pleural
effusions. Mild to moderate pulmonary edema, worsened. Bibasilar
atelectasis, worsened.
IMPRESSION: 1. Right internal jugular Swan-Ganz catheter terminates in the
bronchus intermedius branch of the right pulmonary artery, recommend
5 cm retraction.
2. No pneumothorax.
3. Mild-to-moderate congestive heart failure, worsened.
4. Small bilateral pleural effusions, increased bilaterally.
5. Worsening bibasilar atelectasis.

## 2016-12-19 IMAGING — CR DG CHEST 1V PORT
1 series · 1 of 1 positions shown · non-contrast
Comparison: 05/29/2015 and 12/02/2014.

CLINICAL DATA: Acute respiratory failure. History of sarcoidosis
and diabetes.

EXAM:
PORTABLE CHEST 1 VIEW

[AP]
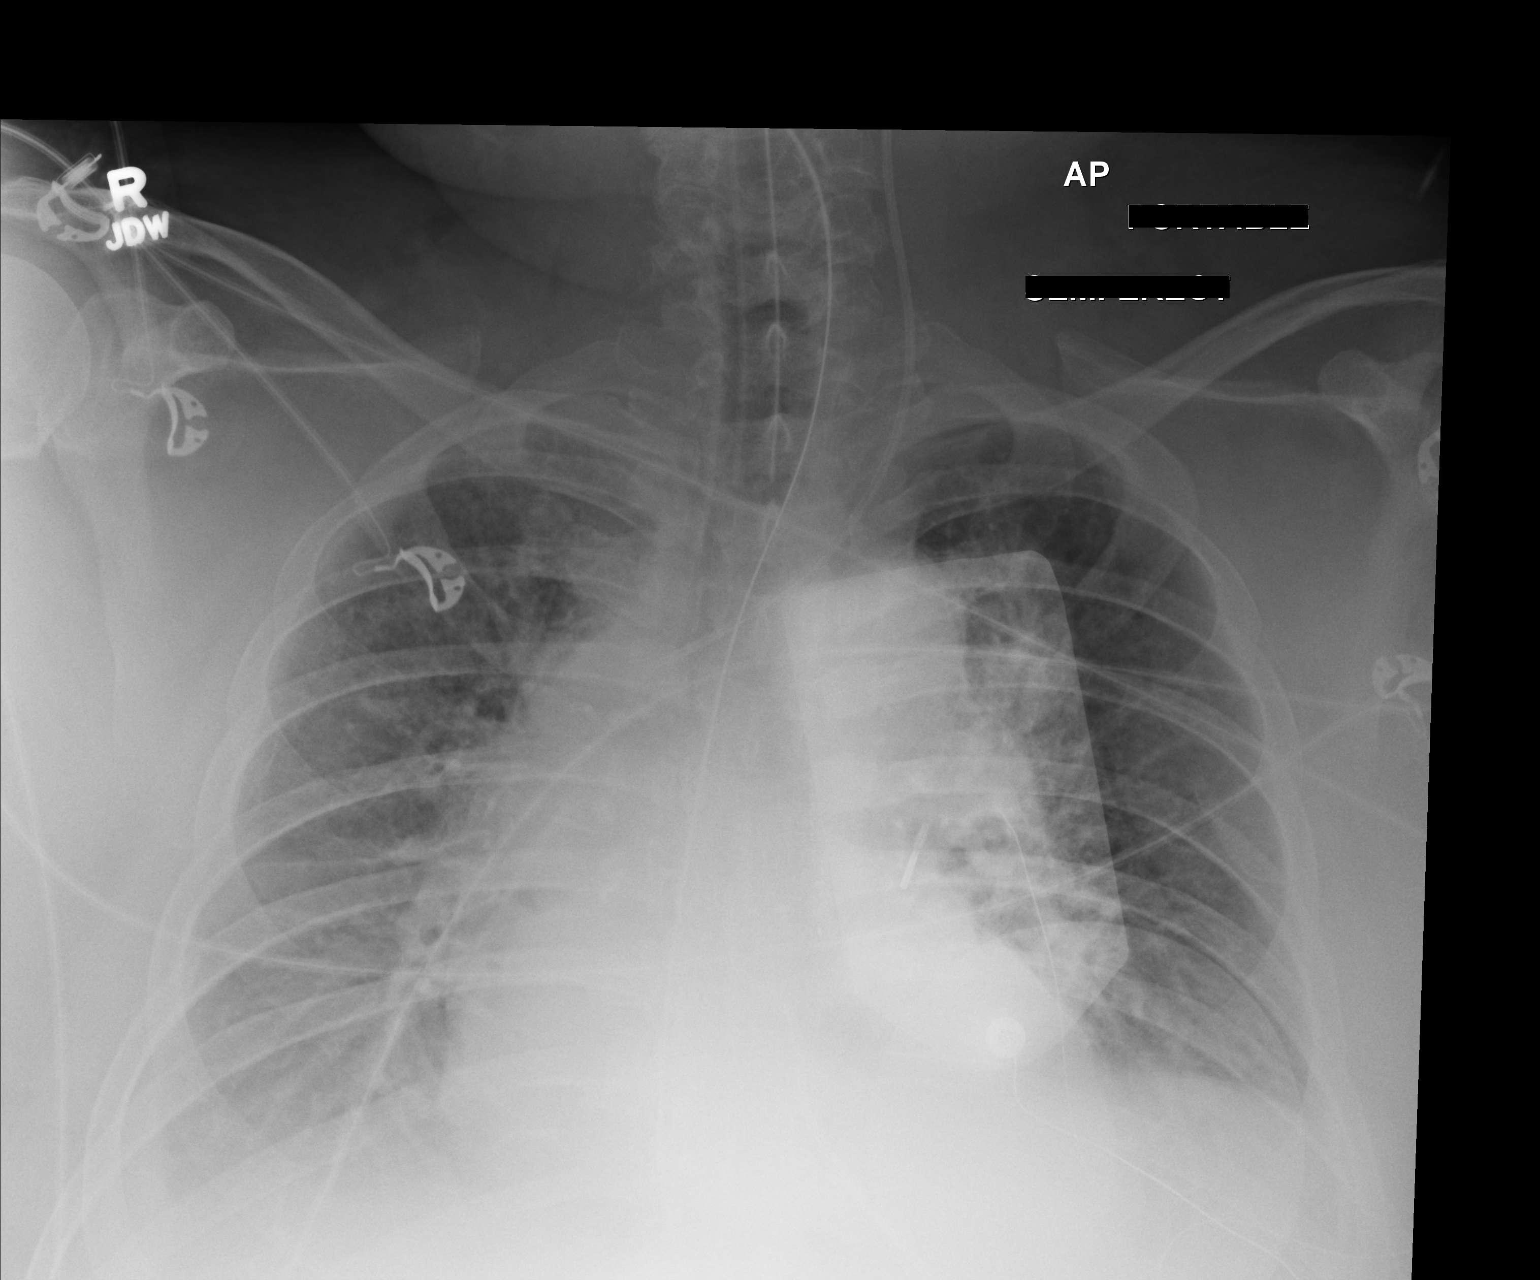

[1 of 1 positions shown; findings below may reference images not displayed]

FINDINGS: 3517 hours. The endotracheal tube, nasogastric tube and left IJ
central venous catheter appear unchanged. External pacers overlie
the left chest. The heart size and mediastinal contours are stable
from yesterday. There are lower lung volumes with increased vascular
congestion and increased patchy opacities at both lung bases. There
may be small bilateral pleural effusions. No consolidation or
pneumothorax identified.
IMPRESSION: Worsening pulmonary aeration with increased bibasilar airspace
opacities and possible pleural effusions. Widening of the superior
mediastinum is unchanged from yesterday, likely at least partly due
to lower lung volumes and AP technique.

## 2016-12-21 IMAGING — CR DG CHEST 1V PORT
1 series · 1 of 1 positions shown · non-contrast
Comparison: 05/31/2015.

CLINICAL DATA: Pulmonary edema .

EXAM:
PORTABLE CHEST 1 VIEW

[AP]
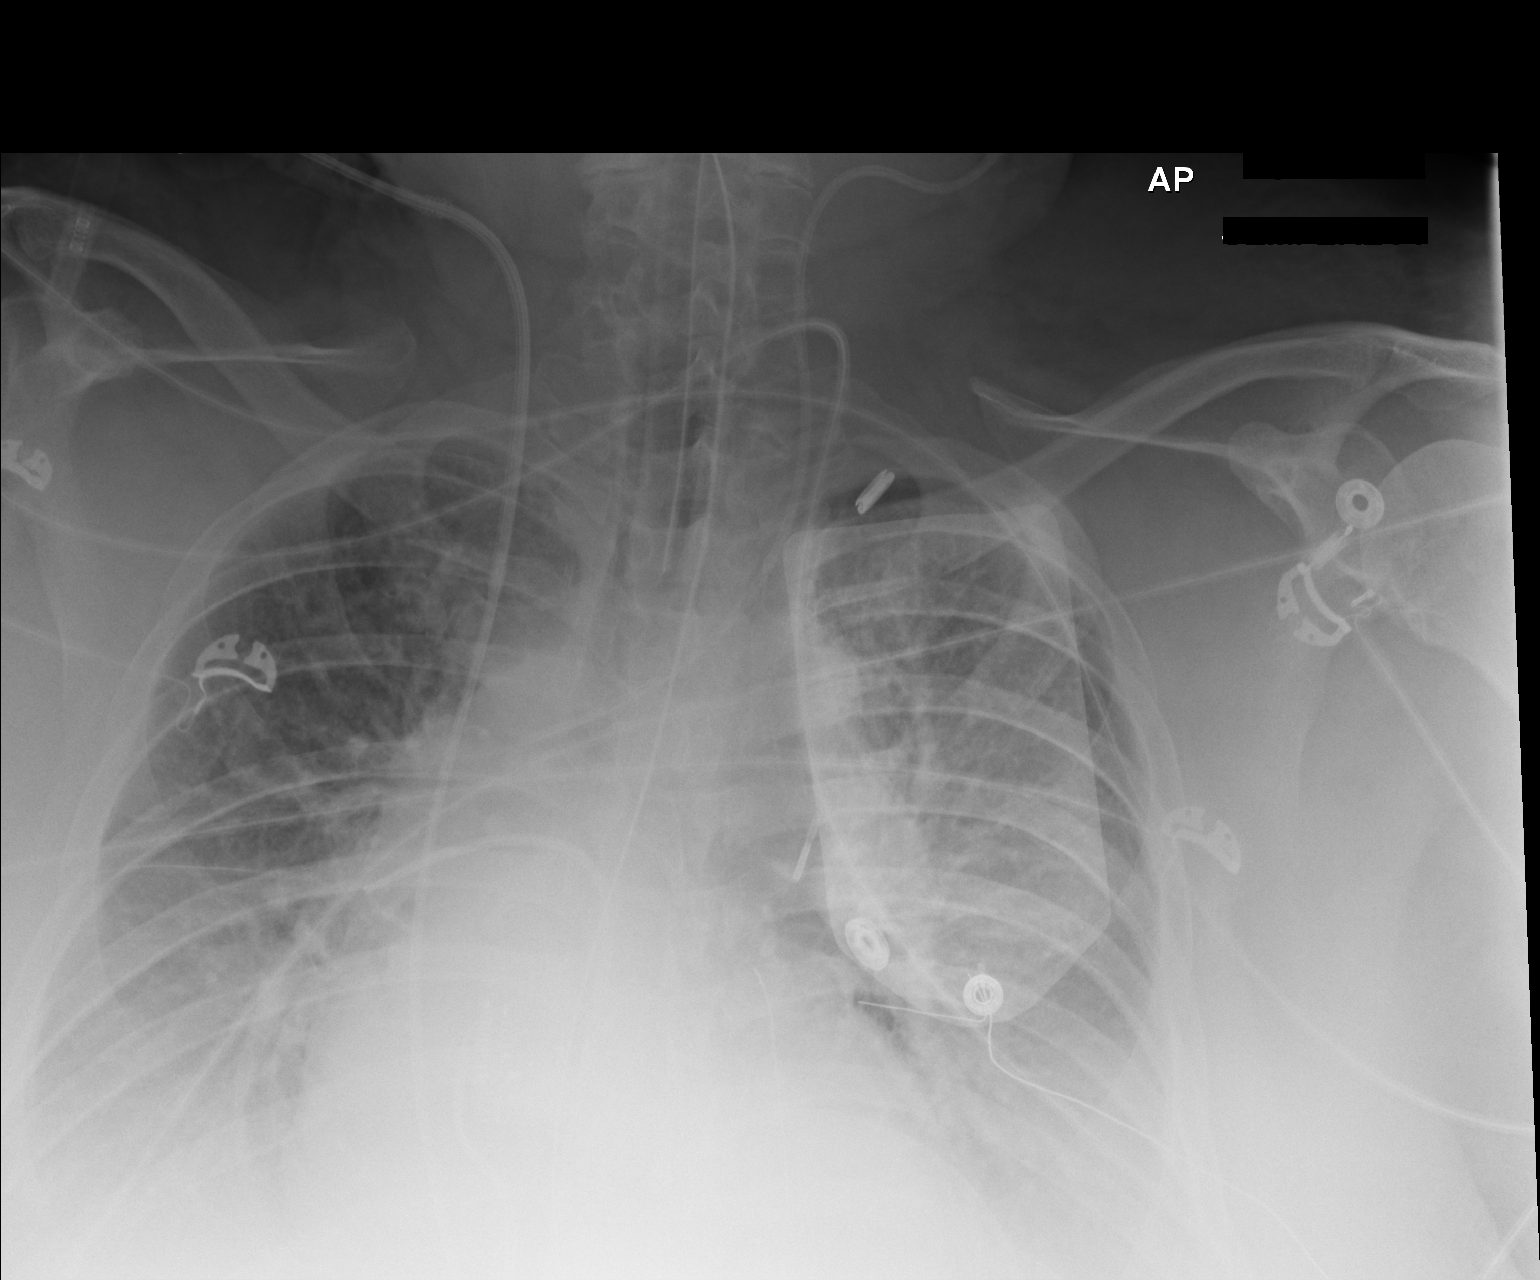

[1 of 1 positions shown; findings below may reference images not displayed]

FINDINGS: Endotracheal tube, NG tube, left IJ catheter, Swan-Ganz catheter,
aortic balloon pump new in unchanged position . Cardiomegaly with
bilateral pulmonary infiltrates consistent pulmonary edema.
Bilateral pleural effusions. No interim change. No pneumothorax .
IMPRESSION: 1. Lines and tubes in unchanged position.

2. Cardiomegaly with with diffuse bilateral pulmonary infiltrates
consistent with pulmonary edema. Bilateral small effusions. No
interim change from prior exam.

## 2016-12-25 IMAGING — CT CT ABD-PELV W/O CM
1 of 4 series · 15 of 46 positions shown, 17 images · non-contrast
Comparison: May 28, 2015

CLINICAL DATA: Recent appendicitis with attempted removal. Cardiac
arrest during surgery with abortion of appendiceal removal. Concern
for potential abscess.

EXAM:
CT ABDOMEN AND PELVIS WITHOUT CONTRAST
TECHNIQUE: Multidetector CT imaging of the abdomen and pelvis was performed
following the standard protocol without oral or intravenous contrast
material administration.

[Series 201: routine, idose (2) · axial · 0.96mm/px · z∈[+35,+495]mm · 15 of 100 slices shown, 17 images]
[im 4/100  soft-tissue]
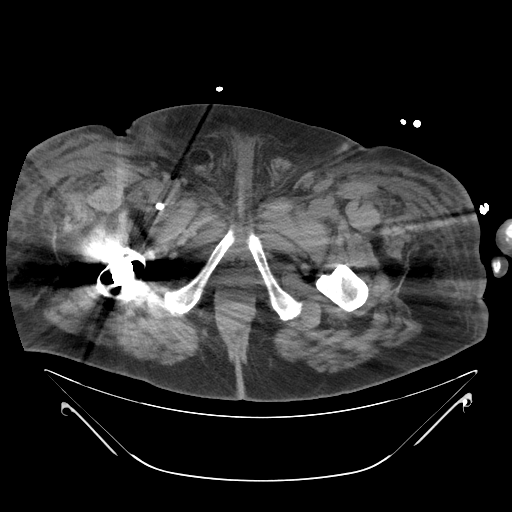
[im 4/100  bone]
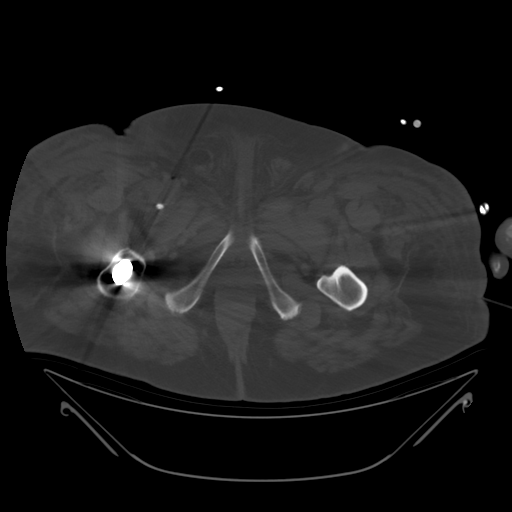
[im 12/100  soft-tissue]
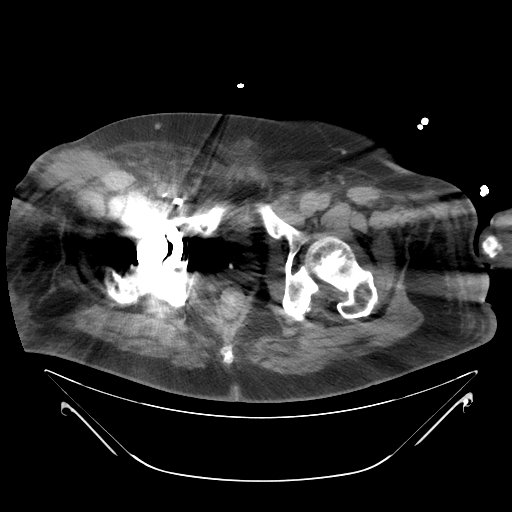
[im 20/100  soft-tissue]
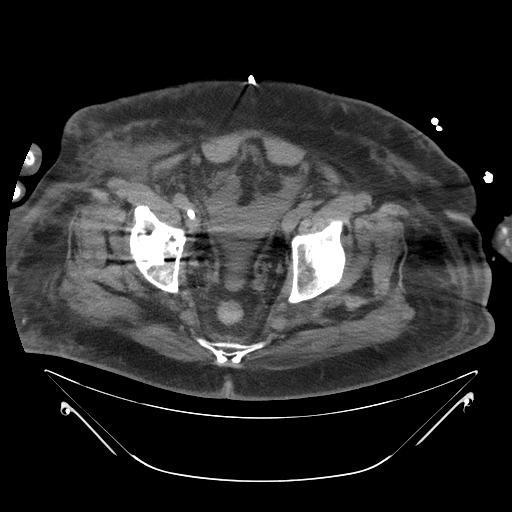
[im 24/100  soft-tissue]
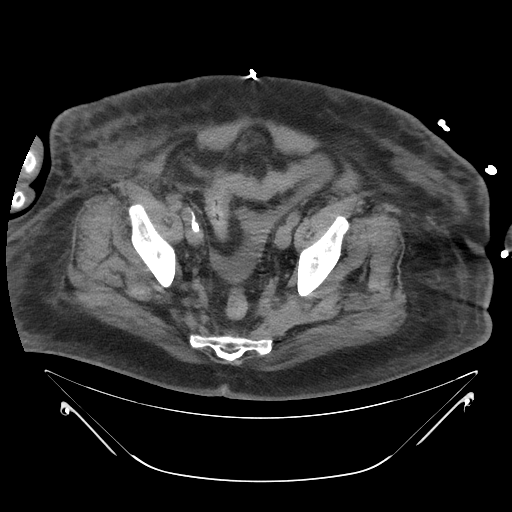
[im 32/100  soft-tissue]
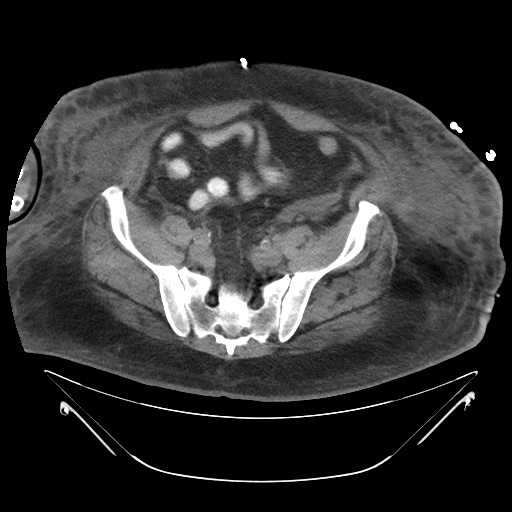
[im 36/100  soft-tissue]
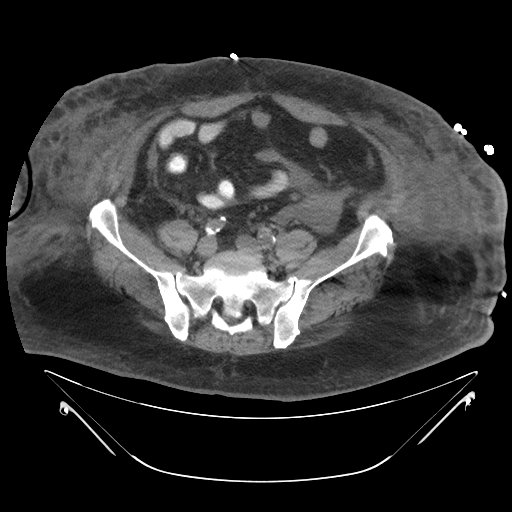
[im 44/100  soft-tissue]
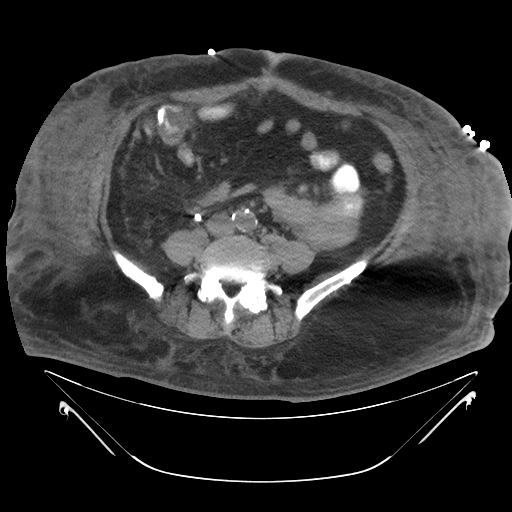
[im 52/100  soft-tissue]
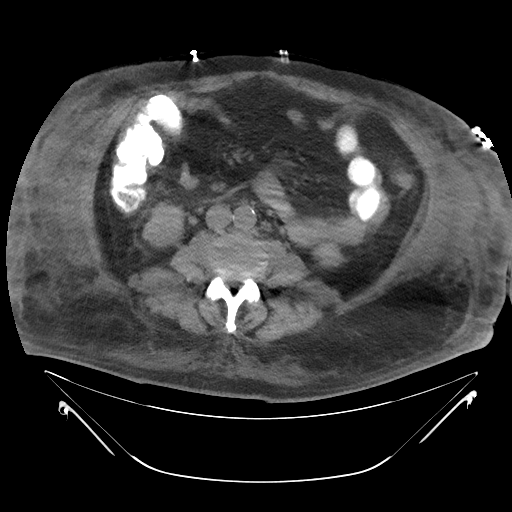
[im 56/100  soft-tissue]
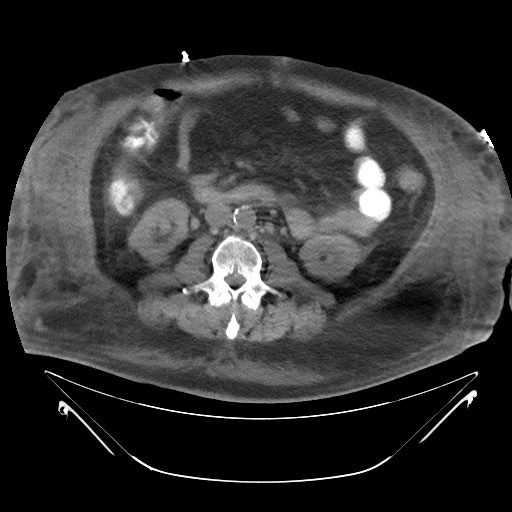
[im 56/100  bone]
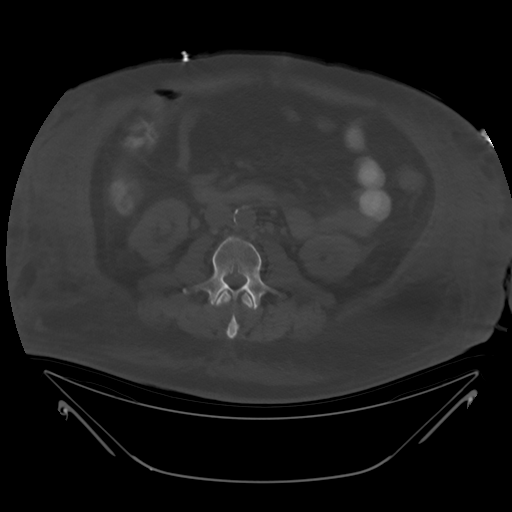
[im 64/100  soft-tissue]
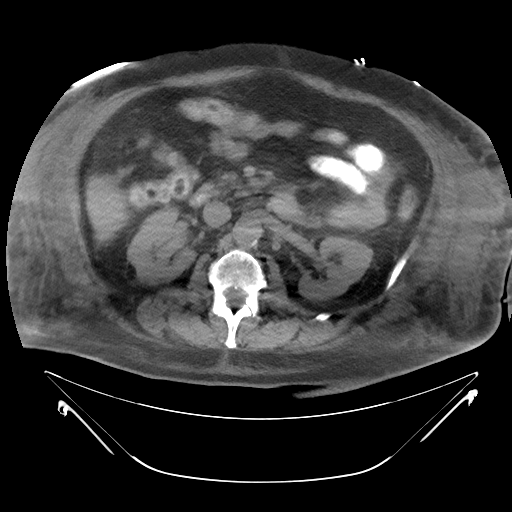
[im 68/100  soft-tissue]
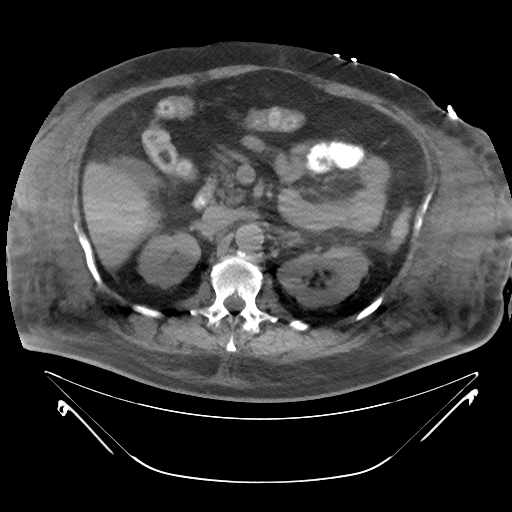
[im 76/100  soft-tissue]
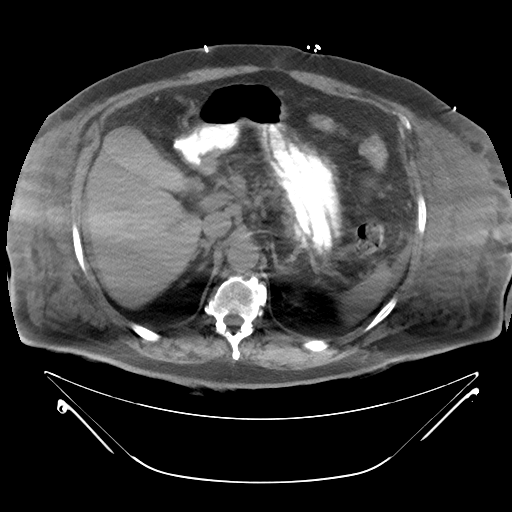
[im 84/100  soft-tissue]
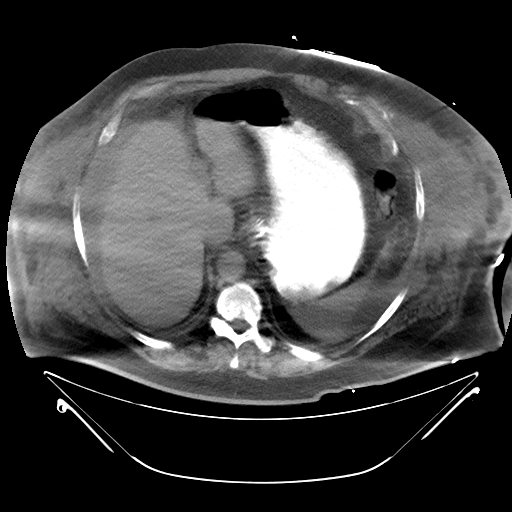
[im 88/100  soft-tissue]
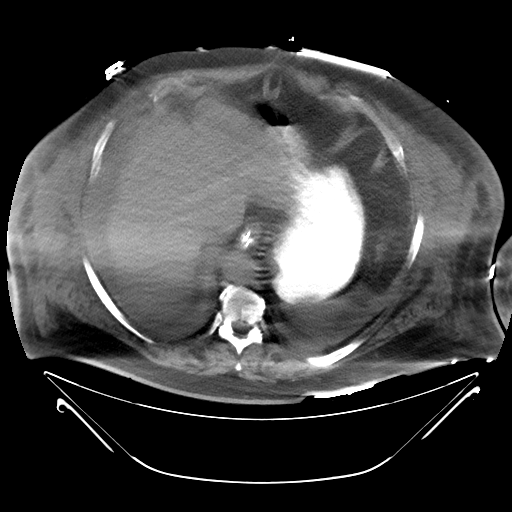
[im 96/100  soft-tissue]
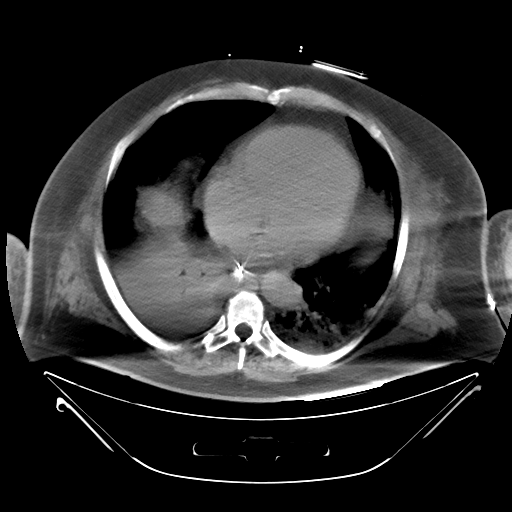

[15 of 46 positions shown; findings below may reference images not displayed]

FINDINGS: Lower chest: There is bilateral lower lobe airspace consolidation.
There is a small left pleural effusion.

Hepatobiliary: No focal liver lesions are identified on this
noncontrast enhanced study. There is contrast within the
gallbladder. Gallbladder wall is not thickened. There is no biliary
duct dilatation.

Pancreas: No mass or inflammatory focus.

Spleen: No splenic lesions are identified.

Adrenals/Urinary Tract: Adrenals appear unremarkable bilaterally.
The left kidney shows no evidence of mass or hydronephrosis. There
is a 1 mm calculus in the mid to lower pole left kidney. There is no
ureteral calculus on the left. On the right, there is no renal mass.
There is moderate hydronephrosis on the right. There is no
intrarenal calculus on the right. There is moderate hydronephrosis
on the right, slightly less than on recent prior study. There is a 6
mm calculus in the proximal right ureter at the level of L4, a
stable finding compared to the prior study. No new ureteral calculi
identified. Urinary bladder is decompressed.

Stomach/Bowel: There is no bowel obstruction. There is a small focus
of pneumoperitoneum in the anterior right upper quadrant which is
immediately adjacent to but felt to be separate from the ascending
colon. There is no portal venous air. Nasogastric tube tube tip is
in the stomach.

Vascular/Lymphatic: There is atherosclerotic calcification in the
aorta. There is no abdominal aortic aneurysm. No adenopathy is
appreciable.

Reproductive: There is artifact in the pelvis due to total hip
prosthesis on the right. There does not appear to be prostatic or
seminal vesicle enlargement.

Other: There is fluid surrounding the appendix. The appendix is less
distended compared to the previous study. There is no abscess or
evidence of perforation focally in the region of the appendix.
Elsewhere no abscess is seen. There is moderate ascites in the
abdomen and pelvis. There is generalized anasarca with extensive
abdominal wall edema.

Musculoskeletal: There is extensive degenerative change throughout
the lower thoracic and lumbar spine. There are no blastic or lytic
bone lesions. There are no intramuscular lesions. Total hip
prosthesis noted on the right.
IMPRESSION: Airspace consolidation in both lung bases. Small left pleural
effusion. Widespread anasarca.

In comparison with the recent prior study, there is less appendiceal
dilatation. There is localized periappendiceal fluid without frank
abscess. No free air is seen near the appendix.

A small amount of free air is noted in the right lower quadrant
immediately adjacent to the ascending colon. It is difficult to
ascertain whether this focal area of pneumoperitoneum is secondary
to recent trocar placement versus appendiceal or viscus perforation.

Moderate ascites.

Contrast in gallbladder, likely vicarious excretion secondary to
recent hypotensive episodes.

Persistent hydronephrosis on the right with 6 mm calculus in the
proximal right ureter at the level of L4. Small nonobstructing
calculus lower pole left kidney.

No bowel obstruction. No demonstrable abscess in the abdomen or
pelvis.

These results will be called to the ordering clinician or
representative by the Radiologist Assistant, and communication
documented in the PACS or z[employer]ision Dashboard.
